# Patient Record
Sex: Female | Born: 1940 | Race: Black or African American | Hispanic: No | Smoking: Former smoker
Health system: Southern US, Community
[De-identification: ages and names within clinical notes are randomized; demographics above are authoritative.]

## PROBLEM LIST (undated history)

## (undated) DIAGNOSIS — D649 Anemia, unspecified: Secondary | ICD-10-CM

## (undated) DIAGNOSIS — I10 Essential (primary) hypertension: Secondary | ICD-10-CM

## (undated) DIAGNOSIS — I739 Peripheral vascular disease, unspecified: Secondary | ICD-10-CM

## (undated) DIAGNOSIS — I509 Heart failure, unspecified: Secondary | ICD-10-CM

## (undated) DIAGNOSIS — Z992 Dependence on renal dialysis: Secondary | ICD-10-CM

## (undated) DIAGNOSIS — I2510 Atherosclerotic heart disease of native coronary artery without angina pectoris: Secondary | ICD-10-CM

## (undated) DIAGNOSIS — L0390 Cellulitis, unspecified: Secondary | ICD-10-CM

## (undated) DIAGNOSIS — I82409 Acute embolism and thrombosis of unspecified deep veins of unspecified lower extremity: Secondary | ICD-10-CM

## (undated) DIAGNOSIS — IMO0002 Reserved for concepts with insufficient information to code with codable children: Secondary | ICD-10-CM

## (undated) DIAGNOSIS — E785 Hyperlipidemia, unspecified: Secondary | ICD-10-CM

## (undated) DIAGNOSIS — G4700 Insomnia, unspecified: Secondary | ICD-10-CM

## (undated) DIAGNOSIS — N186 End stage renal disease: Secondary | ICD-10-CM

## (undated) HISTORY — DX: Anemia, unspecified: D64.9

## (undated) HISTORY — DX: Acute embolism and thrombosis of unspecified deep veins of unspecified lower extremity: I82.409

## (undated) HISTORY — DX: Essential (primary) hypertension: I10

## (undated) HISTORY — DX: Cellulitis, unspecified: L03.90

## (undated) HISTORY — PX: TUBAL LIGATION: SHX77

## (undated) HISTORY — DX: Heart failure, unspecified: I50.9

## (undated) HISTORY — DX: Peripheral vascular disease, unspecified: I73.9

## (undated) HISTORY — DX: Atherosclerotic heart disease of native coronary artery without angina pectoris: I25.10

## (undated) HISTORY — DX: Reserved for concepts with insufficient information to code with codable children: IMO0002

## (undated) HISTORY — DX: Hyperlipidemia, unspecified: E78.5

---

## 1998-01-30 ENCOUNTER — Inpatient Hospital Stay (HOSPITAL_COMMUNITY): Admission: EM | Admit: 1998-01-30 | Discharge: 1998-01-30 | Payer: Self-pay | Admitting: Obstetrics & Gynecology

## 1999-08-15 ENCOUNTER — Inpatient Hospital Stay (HOSPITAL_COMMUNITY): Admission: AD | Admit: 1999-08-15 | Discharge: 1999-08-15 | Payer: Self-pay | Admitting: Obstetrics

## 1999-12-28 ENCOUNTER — Ambulatory Visit (HOSPITAL_COMMUNITY): Admission: RE | Admit: 1999-12-28 | Discharge: 1999-12-28 | Payer: Self-pay | Admitting: Family Medicine

## 1999-12-28 ENCOUNTER — Encounter: Payer: Self-pay | Admitting: Family Medicine

## 2000-03-27 ENCOUNTER — Other Ambulatory Visit: Admission: RE | Admit: 2000-03-27 | Discharge: 2000-03-27 | Payer: Self-pay | Admitting: Family Medicine

## 2001-02-18 ENCOUNTER — Encounter: Payer: Self-pay | Admitting: Family Medicine

## 2001-02-18 ENCOUNTER — Ambulatory Visit (HOSPITAL_COMMUNITY): Admission: RE | Admit: 2001-02-18 | Discharge: 2001-02-18 | Payer: Self-pay | Admitting: Family Medicine

## 2001-03-09 ENCOUNTER — Encounter: Admission: RE | Admit: 2001-03-09 | Discharge: 2001-03-09 | Payer: Self-pay | Admitting: Family Medicine

## 2001-03-09 ENCOUNTER — Encounter: Payer: Self-pay | Admitting: Family Medicine

## 2002-09-10 ENCOUNTER — Encounter: Payer: Self-pay | Admitting: Family Medicine

## 2002-09-10 ENCOUNTER — Encounter: Admission: RE | Admit: 2002-09-10 | Discharge: 2002-09-10 | Payer: Self-pay | Admitting: Family Medicine

## 2004-03-13 ENCOUNTER — Encounter: Admission: RE | Admit: 2004-03-13 | Discharge: 2004-03-13 | Payer: Self-pay | Admitting: Family Medicine

## 2005-03-14 ENCOUNTER — Encounter: Admission: RE | Admit: 2005-03-14 | Discharge: 2005-03-14 | Payer: Self-pay | Admitting: Family Medicine

## 2005-04-01 ENCOUNTER — Inpatient Hospital Stay (HOSPITAL_BASED_OUTPATIENT_CLINIC_OR_DEPARTMENT_OTHER): Admission: RE | Admit: 2005-04-01 | Discharge: 2005-04-01 | Payer: Self-pay | Admitting: Cardiovascular Disease

## 2005-04-01 ENCOUNTER — Ambulatory Visit: Payer: Self-pay | Admitting: Cardiology

## 2005-04-01 ENCOUNTER — Ambulatory Visit: Payer: Self-pay | Admitting: Cardiovascular Disease

## 2006-03-18 ENCOUNTER — Encounter: Admission: RE | Admit: 2006-03-18 | Discharge: 2006-03-18 | Payer: Self-pay | Admitting: Family Medicine

## 2007-01-02 ENCOUNTER — Emergency Department (HOSPITAL_COMMUNITY): Admission: EM | Admit: 2007-01-02 | Discharge: 2007-01-02 | Payer: Self-pay | Admitting: Emergency Medicine

## 2007-06-29 ENCOUNTER — Encounter: Admission: RE | Admit: 2007-06-29 | Discharge: 2007-06-29 | Payer: Self-pay | Admitting: Family Medicine

## 2007-07-10 ENCOUNTER — Encounter: Admission: RE | Admit: 2007-07-10 | Discharge: 2007-07-10 | Payer: Self-pay | Admitting: Family Medicine

## 2008-04-20 ENCOUNTER — Inpatient Hospital Stay (HOSPITAL_COMMUNITY): Admission: EM | Admit: 2008-04-20 | Discharge: 2008-05-02 | Payer: Self-pay | Admitting: Emergency Medicine

## 2008-04-22 HISTORY — PX: CARDIAC CATHETERIZATION: SHX172

## 2008-04-23 ENCOUNTER — Ambulatory Visit: Payer: Self-pay | Admitting: Cardiothoracic Surgery

## 2008-04-23 ENCOUNTER — Encounter: Payer: Self-pay | Admitting: Cardiothoracic Surgery

## 2008-04-23 ENCOUNTER — Encounter (INDEPENDENT_AMBULATORY_CARE_PROVIDER_SITE_OTHER): Payer: Self-pay | Admitting: Cardiology

## 2008-04-24 ENCOUNTER — Encounter: Payer: Self-pay | Admitting: Cardiothoracic Surgery

## 2008-04-25 HISTORY — PX: CORONARY ARTERY BYPASS GRAFT: SHX141

## 2008-05-20 ENCOUNTER — Emergency Department (HOSPITAL_COMMUNITY): Admission: EM | Admit: 2008-05-20 | Discharge: 2008-05-20 | Payer: Self-pay | Admitting: Emergency Medicine

## 2008-05-26 ENCOUNTER — Encounter: Admission: RE | Admit: 2008-05-26 | Discharge: 2008-07-05 | Payer: Self-pay | Admitting: Cardiology

## 2008-05-26 ENCOUNTER — Encounter: Admission: RE | Admit: 2008-05-26 | Discharge: 2008-05-26 | Payer: Self-pay | Admitting: Cardiothoracic Surgery

## 2008-05-26 ENCOUNTER — Ambulatory Visit: Payer: Self-pay | Admitting: Cardiothoracic Surgery

## 2008-05-28 ENCOUNTER — Emergency Department (HOSPITAL_COMMUNITY): Admission: EM | Admit: 2008-05-28 | Discharge: 2008-05-28 | Payer: Self-pay | Admitting: Emergency Medicine

## 2008-05-29 ENCOUNTER — Emergency Department (HOSPITAL_COMMUNITY): Admission: EM | Admit: 2008-05-29 | Discharge: 2008-05-29 | Payer: Self-pay | Admitting: Emergency Medicine

## 2008-06-02 ENCOUNTER — Ambulatory Visit: Payer: Self-pay | Admitting: Cardiothoracic Surgery

## 2008-07-03 ENCOUNTER — Emergency Department (HOSPITAL_COMMUNITY): Admission: EM | Admit: 2008-07-03 | Discharge: 2008-07-03 | Payer: Self-pay | Admitting: Emergency Medicine

## 2008-07-14 ENCOUNTER — Ambulatory Visit: Payer: Self-pay | Admitting: Cardiothoracic Surgery

## 2008-07-21 ENCOUNTER — Encounter (HOSPITAL_COMMUNITY): Admission: RE | Admit: 2008-07-21 | Discharge: 2008-10-10 | Payer: Self-pay | Admitting: Cardiology

## 2009-05-28 IMAGING — CR DG CHEST 2V
1 series · 1 of 1 positions shown · non-contrast
Comparison: 04/28/2008

CLINICAL DATA: Unstable angina.  Chest pain.

CHEST - 2 VIEW

[view not recorded]
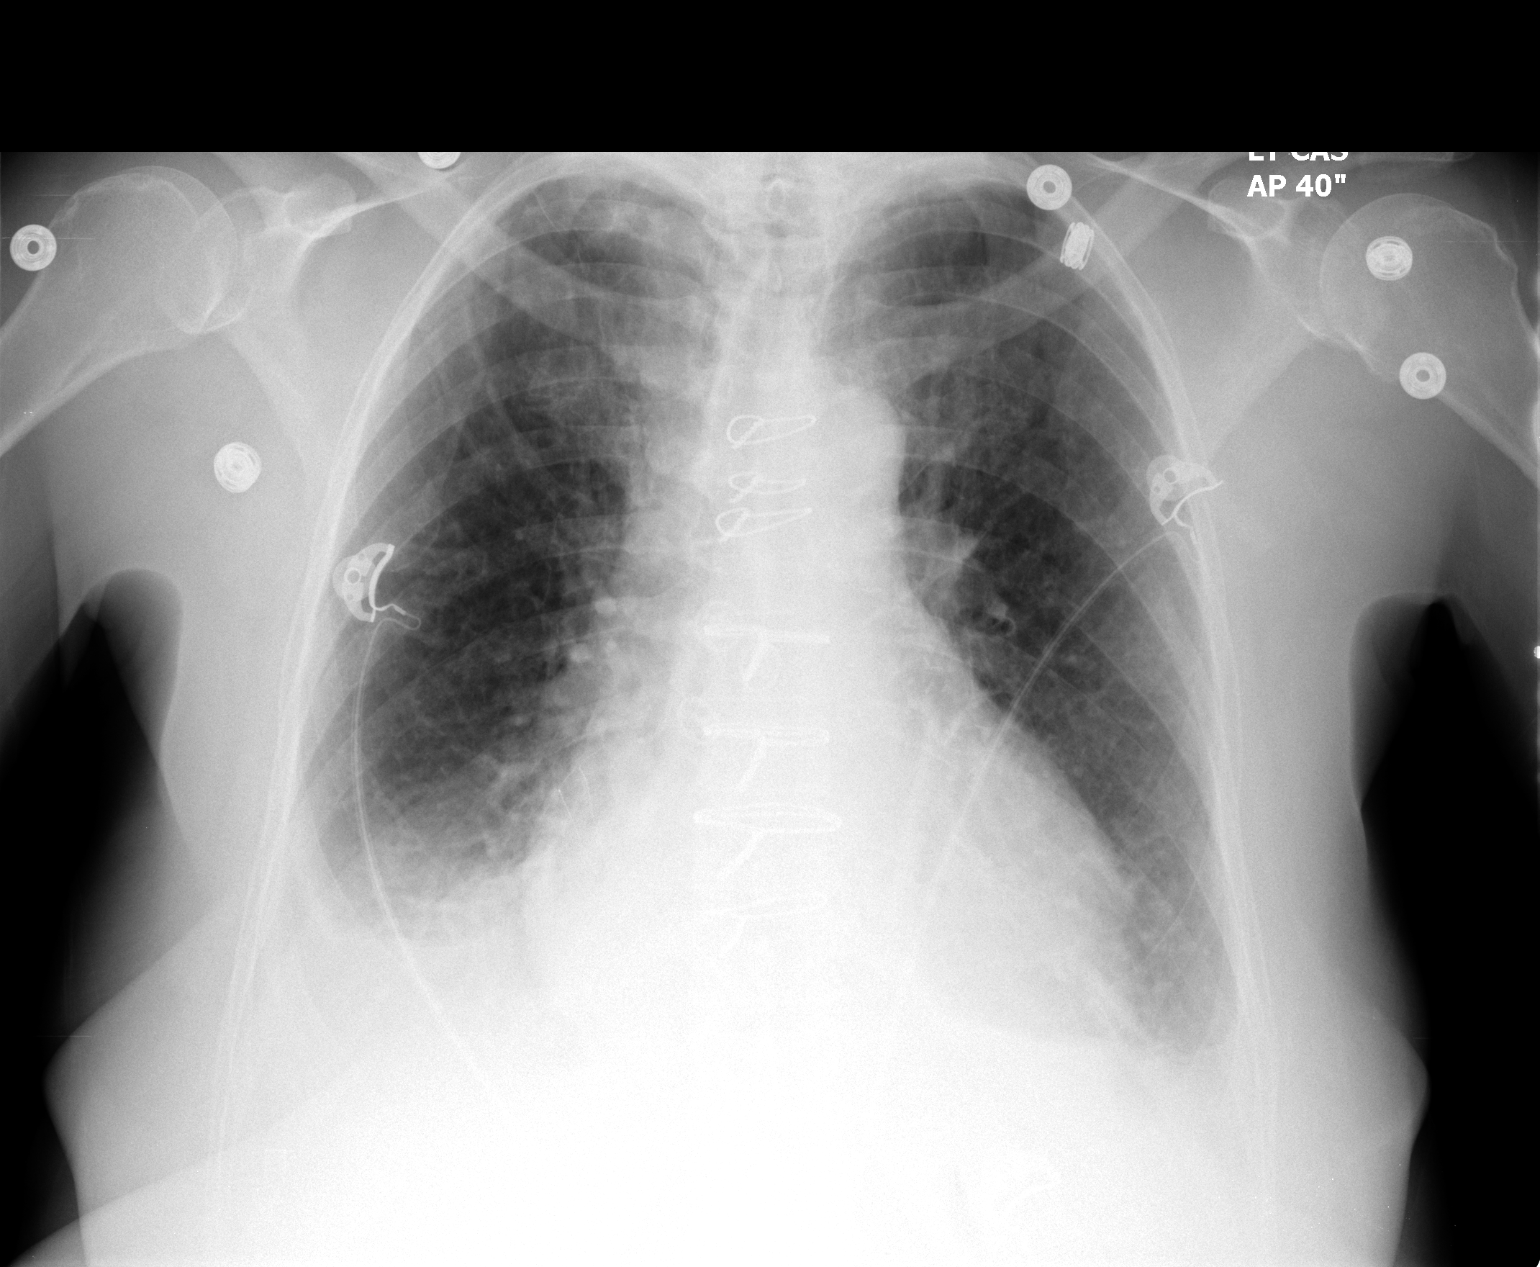

[1 of 1 positions shown; findings below may reference images not displayed]

FINDINGS: The patient is status post median sternotomy and CABG
procedure.

There is moderate cardiac enlargement unchanged.

Pleural effusions and pulmonary edema consistent with congestive
heart failure, stable.

The left apical pneumothorax is not visible on current exam.
IMPRESSION: 1.  No change in congestive heart failure pattern.
2.  No visible pneumothorax identified.

## 2010-07-23 ENCOUNTER — Emergency Department (HOSPITAL_COMMUNITY): Admission: EM | Admit: 2010-07-23 | Discharge: 2010-07-24 | Payer: Self-pay | Admitting: Emergency Medicine

## 2010-11-05 ENCOUNTER — Encounter: Payer: Self-pay | Admitting: Family Medicine

## 2011-02-26 NOTE — Consult Note (Signed)
[name], [name]                  ACCOUNT NO.:  [id number]   MEDICAL RECORD NO.:  [id number]          PATIENT TYPE:  INP   LOCATION:  3703                         FACILITY:  [facility]   PHYSICIAN:  Sheliah Plane, MD    DATE OF BIRTH:  [birth year]   DATE OF CONSULTATION:  04/23/2008  DATE OF DISCHARGE:                                 CONSULTATION   REQUESTING PHYSICIAN:  Ritta Slot, MD   FOLLOWUP CARDIOLOGISTAundra Dubin. Revankar, MD, at [facility].   PRIMARY CARE PHYSICIAN:  Brooke Bonito, MD   REASON FOR CONSULTATION:  Coronary occlusive disease.   HISTORY OF PRESENT ILLNESS:  The patient is a 70 year old female with  known coronary history, who had an inferior myocardial infarction in  2000, treated with cardiac catheterization and then medical therapy.  She had a low-risk stress test done in 2006.  A followup stress test was  done 3 months ago, which was reported as abnormal.  The patient has had  approximately a 2-week episodes of increasing left arm pain and chest  soreness.  A little over a week ago, she awoke during the night with  night sweats and diaphoresis and no fever and short of breath.  She  noted her glucose was over 300 and went to the [facility] Emergency  Room where she was admitted for 2 days, told she did not have a  myocardial infarction, and was discharged home.  She came in on April 20, 2008, with recurrent left arm pain and substernal pain to Dr. Marylen Ponto  office, and was referred to the [facility] Emergency Room.  She was admitted.  Troponin was 0.06.  She has been pain-free since in the hospital.  The  patient has been on Coumadin for unknown reason, which she is unaware  of, and not documented in her chart.  Coumadin was held and ultimately,  she underwent cardiac catheterization yesterday, and Cardiac Surgery is  consulted for consideration of coronary artery bypass grafting.  The  patient's cardiac risk factors include hypertension, hyperlipidemia, a 6-  year history of diabetes, unknown hemoglobin A1c, remote smoking  history, but quit in 1999.  She has had no previous stroke, denies  claudication, and denies renal insufficiency.   PAST MEDICAL HISTORY:  Significant for Coumadin therapy, question of  reason, now on admission was 2.2.  The Coumadin is managed by Dr.  Kem Parkinson office in [place].   PAST SURGICAL HISTORY:  Includes tubal ligation.   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  Denies alcohol use.   FAMILY HISTORY:  Significant for her father died in 67s of skin cancer  and mother died at 63 of kidney cancer.  The patient has 8 siblings, 5  are living, 2 of alcohol-related illnesses, and 1 died as a child.  Other history is unknown.   CURRENT MEDICATIONS:  1. Lisinopril 20 mg a day.  2. Lopressor 50 mg daily.  3. Metformin 500 mg b.i.d.  4.  Lanoxin 0.25 mg a day.  5. Simvastatin 40 mg a day.  6. Lasix 40 mg a day.  7. Levoxyl 4 g a day.  8. Norvasc 5 mg a day.  9. Protonix 40 mg a day.  10.Benicar 20 mg a day.  11.Premarin 0.625 mg a day.  12.Provera 2.5 mg a day.  13.Sliding scale insulin.  14.On admission, the patient had been taking Coumadin.   DRUG ALLERGIES:  ASPIRIN causes rash and hives.   REVIEW OF SYSTEMS:  Cardiac review of systems is positive for chest  pain, exertional shortness of breath, and orthopnea.  Denies lower  extremity edema.  Denies palpitations.  Denies syncope or presyncope.  The patient specifically denies ever having any episodes of rapid heart  rate or being admitted to the hospital for rapid heart rate.  General  review of systems, the patient has had recent night sweats, but no  fevers, no cough, and no hemoptysis.  She does have a history of  wheezing occasionally.  Neurologic, denies amaurosis or TIAs.  She has  arthritis, especially in her left thumb.  The patient denies  any blood  in her stool.  No history of colonoscopy in the past.  She has noted  while at [facility], a very foul-smelling urine and cloudy, it  has since cleared.  Denies psychiatric history.  Other review of systems  are negative.   PHYSICAL EXAMINATION:  VITAL SIGNS:  Blood pressure is 142/56, heart  rate 75 and regular, respiratory rate is 18, O2 sats 95%, and  temperature is 98.6.  The patient is 5 feet 3 inches tall and weighs 173  pounds.  GENERAL:  Awake, alert, neurologically intact, and has poor dentition.  NECK:  No carotid bruits.  LUNGS:  Clear bilaterally.  CARDIAC:  Regular rate and rhythm without murmur or gallop.  ABDOMEN:  Moderately obese abdomen without palpable masses or  tenderness.  The right groin site is without significant hematoma.  EXTREMITIES:  She has no pedal edema, +2 DP and PT pulses bilaterally,  appears to adequate vein for bypass bilaterally.   LABORATORY FINDINGS:  Hematocrit is 35.2, hemoglobin 11.6, white count  10.2, glucose was elevated at 298, and creatinine is 0.01.  Yesterday,  PT was 18.6 and INR was 1.5.   Cardiac catheterization films are reviewed and discussed with Dr.  Mariah Milling.  The patient has 70% proximal right and mid right is totally  occluded.  There is very little at the distal artery.  She has a 90%  ostial circumflex, a small ramus with 90%, several OM branches that  probably looks significant at the bypass.  The distal circ __________  is very small.  LAD has a ostial 60%-70% and mid third 70%-80%.  Ejection fraction is decreased to 40%-45% with inferior wall  hypokinesis.   IMPRESSION:  The patient with accelerating unstable anginal symptoms  with a significant 3-vessel coronary artery disease.  After seeing the  patient and reviewing the films, I discussed the case with Dr. Mariah Milling, I  agreed with his assessment that coronary artery bypass grafting would  offer the best long-term treatment for the patients,  especially with the  complexity of her lesions and being diabetic.  The risks of surgery  including the death, infection, stroke,  myocardial infarction, being blood transfused were all discussed with  the patient in detail.  Prior to surgery, we will obtain carotid Doppler  studies, hemoglobin A1c, and repeat coagulation studies.  Consider her  for surgery on Monday, April 25, 2008.      Sheliah Plane, MD  Electronically Signed     EG/MEDQ  D:  04/23/2008  T:  04/24/2008  Job:  [id number]   cc:   Aundra Dubin. Revankar, M.D.  Antonieta Iba, MD  Brooke Bonito, M.D.

## 2011-02-26 NOTE — Op Note (Signed)
[name], [name]                  ACCOUNT NO.:  [id number]   MEDICAL RECORD NO.:  [id number]          PATIENT TYPE:  INP   LOCATION:  2306                         FACILITY:  [facility]   PHYSICIAN:  Sheliah Plane, MD    DATE OF BIRTH:  [birth year]   DATE OF PROCEDURE:  04/25/2008  DATE OF DISCHARGE:                               OPERATIVE REPORT   PREOPERATIVE DIAGNOSIS:  Coronary occlusive disease with unstable  angina.   POSTOPERATIVE DIAGNOSIS:  Coronary occlusive disease with unstable  angina.   SURGICAL PROCEDURES:  Coronary artery bypass grafting x5 with left  internal mammary to left anterior descending coronary, reverse saphenous  vein graft to the sequentially to the second and first intermediate  coronary artery, reverse saphenous vein graft to the distal circumflex,  reverse saphenous vein graft to the right coronary artery with right  endovein harvesting from the right thigh and calf.   SURGEON:  Sheliah Plane, MD   FIRST ASSISTANT:  Zadie Rhine, PA   BRIEF HISTORY:  The patient is a 70 year old female who presented with  unstable anginal symptoms as she was admitted first to [facility], cardiac catheterization was recommended; however, the patient  declined and was discharged home.  She returns to [facility] Emergency Room  with recurrent unstable angina.  Enzymes were negative.  Cardiac  catheterization was performed on April 22, 2008 by Dr. Thomasena Edis, which  demonstrated significant 3-vessel coronary artery disease with total  occlusion of the right coronary artery greater than 90% stenosis of  proximal circumflex with disease involving to intermediate branches and  the distal circumflex.  The left anterior descending coronary artery  also had a proximal 70-80% lesion.  Ejection fraction was traced  approximately 40% with inferior hypokinesis, coronary artery bypass  grafting was recommended to the patient, who agreed and signed formed  consent.   DESCRIPTION OF PROCEDURE:  With Swan-Ganz arterial line monitors in  place, the patient underwent general endotracheal anesthesia without  incidence.  Given chest and legs prepped with Betadine and draped in the  usual sterile manner.  Using the Guidant endovein harvest system, vein  was harvested from the right thigh and calf.  The vein was of adequate  quality and caliber.  Median sternotomy was performed.  Left internal  mammary artery was dissected down as pedicle graft.  This artery was  divided and had good free flow.  Pericardium was opened.  Overall  ventricular function and anterior appeared preserved.  The patient did  have evidence of inferior scar in the vicinity of  moderate size  posterior lateral branch.  The patient was systemically heparinized,  ascending aorta was cannulated.  The right atrium was cannulated and  aortic root vent cardioplegia needle was introduced into the ascending  aorta.  The patient was placed on cardiopulmonary bypass at 2.4 liters  per minute per meter square.  Sites of anastomosis were selected and  dissected out of the epicardium.  The patient's body temperature was  cooled to 30 degrees.  Aortic crossclamp was applied and 500 mL of cold  blood potassium  cardioplegia was administered with rapid diastolic  arrest of the heart.  Myocardial septal temperatures monitored  throughout the crossclamp.   Attention was turned first to the distal circumflex which time the AV  groove was opened and admitted a 1-mm probe, vessel was  approximately  1.3 mm in size, using a running 7-0 Prolene, distal anastomosis was  performed.  Attention was then turned to 2 intermediate branches, both  small and intramyocardial.  There were identified, the second  intermediate was opened, and using a longitudinal side-to-side  anastomosis was carried out, distal extent of the same vein was then  carried to this first intermediate vessel which was opened more distally  on  the vessel and admitted a 1-mm probe using running 7-0 Prolene,  distal anastomosis was performed.  Attention was then turned to the  distal right coronary artery adjusted to take off of posterior  descending vessel was opened and it was appeared very small and  collateral filling from the catheterization.  However, the 1-1/2-mm  probe passed down the posterior descending, posterior lateral branches  easily using a running 7-0 Prolene, distal anastomosis was performed.  Attention was then turned to left anterior descending coronary artery  which was good quality vessel 1.5-1.6 mm in size, using a running 8-0  Prolene, the left internal mammary artery was anastomosed to left  anterior descending artery with aortic crossclamp still in place, 3  punch aortotomies were performed.  Each of the 3 vein grafts were  anastomosed to the ascending aorta.  Air was evacuated from the grafts,  partial occlusion clamp was removed, aortic crossclamp was removed with  total crossclamp x98 minutes.  Sites of anastomosis were inspected and  free of bleeding.  The patient was then ventilated, and weaned  cardiopulmonary bypass without difficulty.  She remained hemodynamically  stable and was decannulated in the usual fashion.  Protamine sulfate was  administered within operative field.  Hemostatic two atrial, two  ventricular pacing wires were applied.  Graft markers applied.  The left  pleural tube and a Blake mediastinal drain were left in place.  Sternum  closed with #6 stainless steel wire.  Fascia closed with interrupted 0  Vicryl, running 3-0 Vicryl, subcutaneous tissue, and 4-0 subcuticular  stitch in skin edges.  Dry dressings were applied.  Sponge and needle  count was reported as correct at the completion of procedure.  The  patient tolerated the procedure without obvious complication and was  transferred to Surgical Intensive Care Unit for further postoperative  care.      Sheliah Plane,  MD  Electronically Signed     EG/MEDQ  D:  04/25/2008  T:  04/26/2008  Job:  [id number]   cc:   Antionette Char, MD  Antonieta Iba, MD  Aundra Dubin Revankar, M.D.

## 2011-02-26 NOTE — Assessment & Plan Note (Signed)
OFFICE VISIT   [name], [name]  DOB:  06/27/1941                                        June 02, 2008  CHART #:  [id number]   The patient returns to the office today in followup to check on her leg  incision, which had some minor erythema last week and was started on  Keflex.  She notes that after taking 3 Keflex pills, each time she  became nauseated and vomited up.  So she discontinued it.  In spite of  this, her leg has much improved from last week.  She has had no fever or  chills.   On exam, her blood pressure 151/79, pulse 78, respiratory rate is 18,  and O2 sats 98%.  Her sternum is stable and well healed.  Lungs are  clear bilaterally.  She has no pedal edema.  The right endovein harvest  site is healing well.  Distal leg with erythema is markedly improved.   She continues on:  1. Prilosec 20 mg a day.  2. Lasix 40 mg a day.  3. Plavix 75 mg a day.  4. Potassium 20 mEq a day.  5. Lipitor 40 mg a day.  6. Folic acid a mg a day.  7. Lopressor 25 b.i.d.  8. Metformin 1000 mg b.i.d.  9. Lisinopril 10 mg a day.  10.Lanoxin 0.25 mg a day.  11.Prempro 1 per day.   Overall, she is making very good progress.  She has an appointment to  see Dr. Lynnea Ferrier in [place] next week and Dr. Juleen China tomorrow.  Certain  issues remained as whether she should  still be on Prempro.  Preoperatively, she had been maintained on  Coumadin, but it was never clear from our records why.  Currently, she  is on aspirin and Plavix.   Sheliah Plane, MD  Electronically Signed   EG/MEDQ  D:  06/02/2008  T:  06/02/2008  Job:  [id number]   cc:   Brooke Bonito, M.D.  Ritta Slot, MD  Burnell Blanks, MD

## 2011-02-26 NOTE — Assessment & Plan Note (Signed)
OFFICE VISIT   [name], [name]  DOB:  23-Apr-1941                                        July 14, 2008  CHART #:  [id number]   The patient comes in today complaining of pain in her right leg.  She is  status post CABG on 04/25/2008.  She had been most recently seen in our  office in August with a right lower extremity EVH site infection for  which she took a course of Keflex.  On her last visit, the site was  healing well and she had no further problems until approximately 3 days  ago.  At that point, she developed pain at the distal-most portion of  the [facility] tunnel right at the stab incision site.  She states that the  area has been swollen and she is having shooting pains extending from  that point into her anterior foot.  She denies any fevers, chills,  shortness of breath, or chest pain.  She has not seen her family  physician or her cardiologist, but called our office for refill on pain  medication and was asked to present for an evaluation.   PHYSICAL EXAMINATION:  VITAL SIGNS:  Blood pressure 130/69, pulse 76,  respirations 18, and O2 sat 98% on room air.  GENERAL:  Sternal incision has healed well and is her right thigh EVH  site.  HEART:  Regular rate and rhythm.  LUNGS:  Clear.  EXTREMITIES:  Examination of her right calf shows very minimal edema  right at the stab site.  There is no specific generalized edema.  No  erythema, no palpable fluid collection, and no tenderness.  There is no  drainage from the incision itself and it appears to be well  approximated.   ASSESSMENT AND PLAN:  Dr. Tyrone Sage has also seen the patient and we  reviewed her records.  She does have a history of diabetes, which at  times poorly controlled and for she is followed by Dr. Juleen China, M.D.  She  also has a history of peripheral vascular disease with an ankle-brachial  index on the right of 0.4.  There does not appear to be any specific  evidence of infection in the  right lower extremity today.  Dr.  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  We will have her observe this and if there are any  changes including swelling, drainage, fever, or erythema, she is asked  to call our office immediately.  It also may be in the future that she  will need to see a vascular surgeon for further workup of her peripheral  vascular occlusive disease.  She is asked to contact us if she has any  further problems and we will see her back on p.r.n. basis otherwise.   Sheliah Plane, MD  Electronically Signed   GC/MEDQ  D:  07/14/2008  T:  07/15/2008  Job:  [id number]   cc:   Ritta Slot, MD  Brooke Bonito, M.D.

## 2011-02-26 NOTE — Cardiovascular Report (Signed)
[name], RAMNAUTH                  ACCOUNT NO.:  [id number]   MEDICAL RECORD NO.:  [id number]          PATIENT TYPE:  INP   LOCATION:  3703                         FACILITY:  [facility]   PHYSICIAN:  Antonieta Iba, MD   DATE OF BIRTH:  1940/11/16   DATE OF PROCEDURE:  04/22/2008  DATE OF DISCHARGE:                            CARDIAC CATHETERIZATION   PHYSICIAN PERFORMING THE PROCEDURE:  Antonieta Iba, MD   INDICATIONS FOR PROCEDURE:  Ms. Koskela is a very pleasant 70 year old  woman with history of coronary artery disease with worsening chest pain.  She had a positive stress test as an outpatient and presented with chest  pain and further evaluation by cardiac catheterization.   PROCEDURE DETAILS:  The patient was prepped and draped in usual sterile  fashion in the Cath Lab.  Local anesthesia with 1% lidocaine was  performed in the right inguinal region and a 6-French catheter was  advanced through the right femoral artery.  A Judkins left #4 and right  #4 catheter were used to engage the left and right coronary arteries  respectively.  Selective angiography was performed with hand injection  of contrast in various angles to adequately visualize the coronary  arteries.  A pigtail catheter was used to cross the aortic valve and LV  gram was performed.  The sheath was removed at the end of the procedure  and hemostasis was obtained.  No complications were reported.   FINDINGS:  Left main; the left main is a moderate-sized vessel that  bifurcates into left circumflex and the LAD.  There was no significant  disease noted.   LAD; the left anterior descending is a moderate-to-large size vessel  with one diagonal vessel.  There is 70% ostial disease and 70%-80%  proximal disease.  Otherwise, there is no significant disease of the mid-  to-distal LAD and no significant diagonal disease.   Left circumflex; the left circumflex is a large network of branches.  There was a high OM/ramus, as  well as a second high OM #2 and distal OM  branches 3 and 4.  There is 90% ostial left circumflex disease, 70%  ostial ramus/OM1 disease.  The ramus branch also has a 90% proximal  disease.  There is some mild/30% mid-to-distal left circumflex disease;  otherwise, the obtuse marginal branches had no significant disease.   Right coronary artery; the RCA is occluded in the mid region.  It also  has 70% proximal disease and a small-to-moderate size marginal branch.  There are collaterals running from left to right.   LV gram; ejection fraction shows a mildly reduced systolic function with  mild global hypokinesis, moderate inferior wall hypokinesis.  Estimated  ejection fraction of 40-45%.   FINAL IMPRESSION:  Severe three-vessel coronary artery disease.  Of  note, there is severe ostial left circumflex disease extending into the  OM1.  There is also ostial left anterior descending disease, as well as  severe mid left anterior descending disease.  There is severe right  coronary artery disease with collaterals from left-to-right.   Given the numerous lesions,  the images were discussed with Ms. Mastropietro and  her family.  A consultation will be made to the CT Surgery Service for  possible bypass surgery.  The case was discussed with Dr. Yates Decamp and  he is in agreement.  Of note, angiography review frame #10 shows the  ostial lesion of the circumflex well.  She will be started on heparin  for protection through the weekend.  All aspirin and Plavix has been  held for more than 5 days.      Antonieta Iba, MD  Electronically Signed     TJG/MEDQ  D:  04/22/2008  T:  04/23/2008  Job:  [id number]

## 2011-02-26 NOTE — Consult Note (Signed)
[name], [name]                  ACCOUNT NO.:  [id number]   MEDICAL RECORD NO.:  [id number]          PATIENT TYPE:  INP   LOCATION:  2019                         FACILITY:  [facility]   PHYSICIAN:  Michelene Gardener, MD    DATE OF BIRTH:  1940/10/19   DATE OF CONSULTATION:  04/28/2008  DATE OF DISCHARGE:                                 CONSULTATION   PRIORITY CONSULTATION   REASON FOR CONSULTATION:  Uncontrolled diabetes.   HISTORY OF PRESENT ILLNESS:  This is a 70 year old African American  female, now a past medical history known for coronary artery disease,  presented to the hospital for evaluation of worsening chest pain.  This  patient was admitted under the Cardiology service by Dr. Mariah Milling, and she  underwent a cardiac catheterization on July 10 that showed severe 3-  vessel coronary artery disease.  The patient was taken for CABG on April 25, 2008, and that went without complications.  A consult was called  today for uncontrolled diabetes.  This patient had hemoglobin A1c done  on July 12 and it was 10.6.  Patient takes Humulin 50/50, 65 units twice  daily at home in addition to Humulin-L 35 units twice daily.  She stated  that her sugar normally is not very well controlled and it runs in the  200 to 300, and she has attributed that to her diet since she has been  very noncompliant and she can take all sugars including ice cream and  all other sugar.  She was advised many times by her primary doctor to be  more compliant with her diet, but she continued to do the same.  Here in  the hospital, she has been on Lantus, and her Lantus was at 20 units  twice a day, and her sugar has been running in the range of 200 to 300.  Currently, the patient is asymptomatic, she is chest pain free, and she  does not have shortness of breath.   PAST MEDICAL HISTORY:  Significant for:  1. Coronary artery disease.  2. Hypertension.  3. Hyperlipidemia.  4. Hypothyroidism.  5. GERD.  6.  Obesity.   MEDICATIONS ON ADMISSION:  Includes:  1. Lisinopril 20 mg once a day.  2. Lopressor 50 mg once a day.  3. Metformin 500 mg twice daily, and patient has stated that she      stopped the metformin because it caused her a lot of abdominal      discomfort and diarrhea so she was not taking this for some time.  4. Lanoxin 0.25 mg once a day.  5. Simvastatin 40 mg once a day.  6. Lasix 40 mg once a day.  7. Synthroid unknown dose.  8. Norvasc 30 mg once a day.  9. Protonix 40 mg once a day.  10.Benicar 20 mg once a day.  11.Premarin 0.625 mg once a day.  12.Provera 2.5 mg once a day.  13.Humulin 50/50, 65 units twice daily.  14.Humulin-L 35 units twice daily.   PAST SURGICAL HISTORY:  1. Status post CABG on April 25, 2008, at [facility].  2. History of tubal ligation.   ALLERGIES:  ASPIRIN.   FAMILY HISTORY:  Father died at 29 of a skin cancer.  Mother died at age  60 of kidney cancer.  Patient has 8 siblings, 5 are living, 2 have  alcohol-related illness and 1 died as a child.   SOCIAL HISTORY:  The patient lives alone.  She has children.  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  She denied smoking and she denies alcohol drinking.   REVIEW OF SYSTEMS:  CONSTITUTIONAL:  Currently positive for  fatigability.  There is no fever.  EYES:  No blurred vision.  ENT:  No  tinnitus.  RESPIRATORY:  No cough, no wheezes.  CARDIOVASCULAR:  Positive for chest pain and shortness of breath.  There is no syncope  and there are no palpitations.  GU:  No dysuria, no hematuria.  GI:  No  nausea, no vomiting, no diarrhea.  ID:  No rash, no lesions.  NEURO:  No  numbness, no tingling.  The rest of the systems were reviewed and they  were negative.   PHYSICAL EXAMINATION:  VITAL SIGNS:  Temperature is 97.0, heart rate 80,  respiratory rate 18, blood pressure 135/63.  GENERAL APPEARANCE:  This is a middle-aged, African  American female not  in acute distress.  HEENT:  Her conjunctivae are pink.  Her pupils are equal and reactive to  light.  There is no ptosis.  Hearing is intact.  There is no ear  discharge or infection.  There is no nasopharyngeal bleeding.  Oral  mucosa is dry.  No pharyngeal erythema.  NECK:  Supple, no JVD, no carotid bruit, no lymphadenopathy, no thyroid  enlargement or thyroid tenderness.  CARDIOVASCULAR:  S1 and S2 are regular.  There is a big scar secondary  to CABG, which seems to be healing.  ABDOMEN:  The abdomen is soft, nondistended, no hernias, no  hepatosplenomegaly, bowel sounds are present.  LOWER EXTREMITIES:  No edema, no cyanosis, no varicosis.  SKIN:  No rash and no erythema.  NEURO:  Cranial nerves are intact II-XII.  There are no motor or sensory  deficits.   LABORATORY RESULTS:  Sodium 136, potassium 4.6, chloride 105, bicarb 24,  glucose 251, BUN 22, creatinine 1.16, calcium 8.0.  WBC 15.6, hemoglobin  8.4, hematocrit 25.1, platelet count is 216.   IMPRESSION:  1. Uncontrolled diabetes.  2. Coronary artery disease status post coronary artery bypass      grafting.  3. Postoperative anemia.  4. Hypertension.  5. Hypothyroidism.  6. Gastroesophageal reflux disease.   PLAN:  This patient seems to be very noncompliant to her diet as an  outpatient.  She was taking a very high dose of insulin as an outpatient  where she takes Humulin 50/50 and she takes 65 units twice a day.  She  also takes Humulin-L 35 units twice a day.  But as per her, she has been  taking everything possible including ice cream and all other food; that  is what raised her sugar.  Her hemoglobin A1c prior to her admission is  10.6, which will indicate very bad control of her sugar in the last 3  months.  A key issue in her treatment will be compliance with her diet.  Here in the hospital, she was started on Lantus.  She was taking 20  units twice a day,  which will be a very small amount  compared to her  large requirement of insulin.  I had a long discussion with her  regarding compliance with her medications and she seems to be  understanding, but she is not promising that she will be compliant.  That has to be reinforced and discussed with her again to maintain a  better control of her sugar.  While here in the hospital, will try to  get her sugar at least below 150 especially with her cardiac problems.  I will increase her Lantus to 35 units twice daily.  I will add NovoLog  4 units to be given with meals.  I will put her in a resistant sliding  scale.  I will monitor her sugar very frequently and I will make  adjustments according to her CBG readings.   Otherwise, we will continue the management that has been already started  by Cardiology and Cardiothoracic Surgery.  This patient was on Coumadin  prior to her admission for an unknown reason.  I will defer the  assessment of restarting Coumadin for her cardiologist and her primary  doctor.  Thank you so much.   TOTAL ASSESSMENT TIME:  One hour.      Michelene Gardener, MD  Electronically Signed     NAE/MEDQ  D:  04/28/2008  T:  04/28/2008  Job:  [id number]

## 2011-02-26 NOTE — Discharge Summary (Signed)
[name], [name]                  ACCOUNT NO.:  [id number]   MEDICAL RECORD NO.:  [id number]          PATIENT TYPE:  INP   LOCATION:  2019                         FACILITY:  [facility]   PHYSICIAN:  Sheliah Plane, MD    DATE OF BIRTH:  [birth year]   DATE OF ADMISSION:  04/20/2008  DATE OF DISCHARGE:  05/02/2008                               DISCHARGE SUMMARY   FINAL DIAGNOSIS:  Coronary occlusive disease with unstable angina.   IN-HOSPITAL DIAGNOSES:  1. Acute blood loss anemia postoperatively.  2. Volume overload postoperatively.  3. Poorly-controlled diabetes mellitus.   SECONDARY DIAGNOSES:  1. Hypertension.  2. Diabetes mellitus.  3. Hyperlipidemia.  4. Gastroesophageal reflux disease.  5. Significant for Coumadin therapy question of reason.  Coumadin is      managed by Dr. Kem Parkinson office in [place].  6. Status post tubal ligation.   IN-HOSPITAL OPERATIONS AND PROCEDURES:  1. Cardiac catheterization.  2. Coronary artery bypass grafting x5 using a left internal mammary      artery to left anterior descending coronary artery, reverse      saphenous vein graft sequentially to the second and first      intermediate coronary artery, reverse saphenous vein graft to      distal circumflex, reverse saphenous vein graft to right coronary      artery with right endovein harvesting from right thigh and calf.   HISTORY AND PHYSICAL AND HOSPITAL COURSE:  The patient is a 67-year  female who presented with unstable anginal symptoms as she was admitted  first to [facility] for which cardiac catheterization was  recommended.  However, the patient declined catheterization and was  discharged home.  She returned to [facility] with recurrent  unstable angina.  Enzymes were negative.  Cardiac catheterization was  done on April 22, 2008 by Dr. Thomasena Edis, which demonstrated significant  three-vessel coronary artery disease with total occlusion of right  coronary artery and  greater than 90% stenosis of the proximal circumflex  with disease involving the intermediate branches into the distal  circumflex.  The left anterior descending coronary artery also had a  proximal 70%-80% lesion.  Ejection fraction was trace approximately 40%  with inferior hypokinesis.  Coronary artery bypass grafting was  recommended.  Dr. Tyrone Sage was consulted.  Dr. Tyrone Sage discussed the  patient undergoing coronary artery bypass grafting.  He discussed risks  and benefits.  The patient also understanding agreed to proceed.  Surgery was scheduled for April 25, 2008.  Preoperatively, the patient  underwent bilateral carotid duplex ultrasound showing on the right to  have 40%-60% ICA stenosis.  On the left, she had no significant ICA  disease.  The patient also had preoperative ABIs done showing on the  right to be 0.41, on the left to be 0.62.  The patient remained stable  preoperatively.  Her hemoglobin A1c although was noted to be 10.6  preoperatively.  For details of the patient's past medical history and  physical exam, please see dictated H&P.   The patient was taken to the operating room on April 25, 2008 where she  underwent coronary bypass grafting x5 using a left internal mammary  artery to left anterior descending coronary artery, reverse saphenous  vein graft sequentially to the second and first intermediate coronary  artery, reverse saphenous vein graft to distal circumflex, reverse  saphenous vein graft to right coronary artery.  Right endovein  harvesting was done from the right thigh and calf.  The patient  tolerated this procedure well and was transferred to the Intensive Care  Unit in stable condition.  The patient was noted to be hemodynamically  stable.  She did require transfusion on the evening of surgery due to  hemoglobin/hematocrit dropping to 7.7 and 23%.  The patient was able to  be extubated on the evening of surgery.  Post extubation, the patient  was  noted to be alert and oriented x4 and neuro intact.  Postop day #1,  chest x-ray was obtained which was stable.  She had minimal drainage  from chest tubes.  No air leak noted.  Chest tubes were discontinued in  normal fashion.  Repeat chest x-ray remained stable.  The patient was  encouraged to use her incentive spirometer.  She was eventually able to  be weaned off oxygen with sats greater than 90% on room air.  Postoperatively, the patient was noted to be in normal sinus rhythm.  All drips were weaned and discontinued.  Heart rate and blood pressure  were stable.  She was able to be restarted on low-dose beta blocker.  The patient remained in normal sinus rhythm postoperatively.  Blood  pressures continued to be monitored and slightly elevated.  She was  restarted on low-dose lisinopril.  The patient's blood pressure  stabilized prior to discharge.  Postoperatively, the patient's  hemoglobin/hematocrit continued to be followed.  As stated above, she  received transfusion on the evening of surgery.  Hemoglobin/hematocrit  increased appropriately.  She continued to have slight anemia.  She was  started on folic acid.  The patient was asymptomatic from her anemia.  Further transfusions were held.  Hemoglobin/hematocrit improved.  Prior  to discharge, the patient's H&H was 8.6 and 26.3%.  As stated above, the  patient was on Coumadin for unknown reason.  It is felt that  postoperatively, the patient is not needed to be restarted on Coumadin.  Due to aspirin allergy, she was restarted on Plavix.  This was agreed  upon by cardiology.  The patient did have mild volume overload  postoperatively.  She was started on diuretics.  Daily weights were  obtained and the patient was 5.7 kg below her preoperative weight.  The  patient's incisions were followed.  They were noted to be clean, dry,  and intact and healing well.  The patient was noted to be diabetic.  At  stated above, she had preoperative  hemoglobin A1c of 10.6.  Blood sugars  postoperatively were elevated.  Internal Medicine was consulted for  diabetic control.  They started the patient on Lantus insulin and  NovoLog.  Her metformin was also able to be restarted.  Blood sugars  improved prior to discharge home.  The patient was out of bed,  ambulating well with cardiac rehab.  She was tolerating diet well.  No  nausea or vomiting noted.   On postop day #7, May 10, 2008, the patient was noted to be afebrile.  She was in normal sinus rhythm with heart rate in the 70s.  Blood  pressure stable at 128/60 with O2 sats 95% on  room air.  Last labs done  on postop day #7 showed a white count 12.7, hemoglobin of 8.6,  hematocrit 26.3.  Sodium of 136, potassium 4.1, chloride of 160, bicarb  of 26, BUN of 13, creatinine 1.02, glucose of 69.  Platelet count noted  to be stable day prior at 364.  The patient was felt to be stable and  ready for discharge home today, May 10, 2008, postop day #7.   FOLLOWUP APPOINTMENTS:  A followup appointment was arranged with Dr.  Tyrone Sage for May 26, 2008 at 2 p.m.  The patient will need to obtain  PMI chest x-ray 30 minutes prior to this appointment.  The patient will  need to follow up with Dr. Tomie China in [place] in 2 weeks for  cardiology.  She will need to contact his office to make these  arrangements.  She also need to follow up with Dr. Juleen China in the next 2  weeks.  If she did not wish to follow up with Dr. Juleen China, she will need  to obtain a new primary care physician to see in the next 2 weeks for  diabetes control.   ACTIVITY:  The patient instructed no driving until released to do so, no  lifting over 10 pounds.  She is told to ambulate 3-4 times per day,  progress as tolerated, and continue her breathing exercises.   INCISIONAL CARE:  The patient is told to shower washing her incisions  using soap and water.  She is to contact the office if she develops any  drainage or opening  from any of her incision sites.   DIET:  The patient educated on diet to be low-fat, low-salt as well as  diabetic diet.   DISCHARGE MEDICATIONS:  1. Provera 2.5 mg daily.  2. Lipitor 40 mg at night.  3. Nitroglycerin sublingual 0.4 mg p.r.n.  4. Lasix 40 mg daily.  5. Potassium 20 mEq daily.  6. Prilosec daily.  7. Lisinopril 10 mg daily.  8. Metformin 500 mg b.i.d.  9. Folic acid 1 mg daily.  10.Ultram 50 mg 1-2 tablets q.4-6 h. p.r.n.  11.Plavix 75 mg daily.  12.Lantus insulin 40 units subcu every 12 hours.  13.Lopressor 25 mg b.i.d.      Stephanie Acre [place], [place]      Sheliah Plane, MD  Electronically Signed    KMD/MEDQ  D:  05/02/2008  T:  05/03/2008  Job:  [id number]   cc:   Brooke Bonito, M.D.  Aundra Dubin. Revankar, M.D.  Ritta Slot, MD

## 2011-02-26 NOTE — Assessment & Plan Note (Signed)
OFFICE VISIT   Rhonda, [name]  DOB:  02-20-1941                                        May 26, 2008  CHART #:  [id number]   The patient is status post coronary artery bypass grafting x5 done by  Dr. Tyrone Sage on April 25, 2008.  Her postoperative course was pretty much  unremarkable.  She was discharged to home in stable condition.  She  presents today for a 3-week followup visit.  On discussion today, the  patient feels that she is progressing well.  She is ambulating several  times a day.  She does complain of a site on her right lower extremity  that is slightly tender to touch and little redness noted.  Denies any  opening or drainage from any of her incision sites.  Denies any nausea  or vomiting.  We would like to talk about obtaining a new cardiologist.  They do not want to see Dr. Aleen Campi again.  The patient's daughter is  in the room with her and states that they tried to contact cardiac  rehab, but cardiac rehab does not have paperwork on her mother.  The  patient is tolerating diet well.  Daughter states that they have been  monitoring her mother's blood sugars, but have been bottoming out at  night.  Dr. Eula Flax has been contacted.   PHYSICAL EXAMINATION:  VITAL SIGNS:  Blood pressure 178/78, pulse of 82,  respirations of 18, and O2 sat 90% on room air.  RESPIRATORY:  Clear to auscultation bilaterally.  CARDIAC:  Regular rate and rhythm.  No murmurs, gallops, or rubs noted.  ABDOMEN:  Benign.  EXTREMITIES:  The patient's right lower extremity EVH site is with some  mild erythema and tender to touch.  No purulent drainage noted.   INCISIONS:  General incision, 2 Vicryl stitches have been removed.  There is no purulent drainage or cellulitis noted.   LABORATORY DATA:  PA and lateral chest x-ray done today, August 13, is  stable.  There is a nodular density in the right apex noted at this  time.  This was noted in her preoperative chest  x-ray.   IMPRESSION AND PLAN:  The patient is status post coronary artery bypass  grafting.  She was evaluated by Dr. Tyrone Sage.  Dr. Tyrone Sage also  evaluated the patient's chest x-ray.  The patient is progressing well.  She was given a prescription for Keflex 500 mg t.i.d. for 7 days for a  right lower extremity wound infection.  We will contact Dr. Windell Hummingbird  office to schedule followup appointment with her next week.  She is told  to contact Dr. Eula Flax' office to follow up with him in the next 1-2 weeks  for blood sugar management.  We will also contact cardiac rehab to  arrange for the patient to start.  The patient is told to continue  ambulating 3-4 times per day.  She is told to do no heavy lifting over  10 pounds.  We will plan to see the patient back in 1 week to recheck  her right lower extremity wound infection.  At this time, we will need  to schedule a followup appointment with the patient, probably in the  next 3-6 months with a repeat chest x-ray to follow up this nodular  density in the right apex.  The patient is told in the interim, if she  has any surgical issues she is to contact us.   Sheliah Plane, MD  Electronically Signed   KMD/MEDQ  D:  05/26/2008  T:  06/08/2008  Job:  [id number]   cc:   Brooke Bonito, M.D.  Ritta Slot, MD

## 2011-02-26 NOTE — Assessment & Plan Note (Signed)
OFFICE VISIT   [name], [name]  DOB:  [birth year]                                        May 26, 2008  CHART #:  [id number]   The patient is status post coronary artery bypass grafting x5 done by  Dr. Tyrone Sage on April 25, 2008.  Her postoperative course was pretty much  unremarkable.  She was discharged to home in stable condition.  She  presents today for a 3-week followup visit.  On discussion today, the  patient feels that she is progressing well.  She is ambulating several  times a day.  She does complain of a site on her right lower extremity  that is slightly tender to touch and little redness noted.  Denies any  opening or drainage from any of her incision sites.  Denies any nausea  or vomiting.  We would like to talk about obtaining a new cardiologist.  They do not want to see Dr. Aleen Campi again.  The patient's daughter is  in the room with her and states that they tried to contact cardiac  rehab, but cardiac rehab does not have paperwork on her mother.  The  patient is tolerating diet well.  Daughter states that they have been  monitoring her mother's blood sugars, but have been bottoming out at  night.  Dr. Eula Flax has been contacted.   PHYSICAL EXAMINATION:  VITAL SIGNS:  Blood pressure 178/78, pulse of 82,  respirations of 18, and O2 sat 90% on room air.  RESPIRATORY:  Clear to auscultation bilaterally.  CARDIAC:  Regular rate and rhythm.  No murmurs, gallops, or rubs noted.  ABDOMEN:  Benign.  EXTREMITIES:  The patient's right lower extremity EVH site is with some  mild erythema and tender to touch.  No purulent drainage noted.   INCISIONS:  General incision, 2 Vicryl stitches have been removed.  There is no purulent drainage or cellulitis noted.   LABORATORY DATA:  PA and lateral chest x-ray done today, August 13, is  stable.  There is a nodular density in the right apex noted at this  time.  This was noted in her preoperative chest  x-ray.   IMPRESSION AND PLAN:  The patient is status post coronary artery bypass  grafting.  She was evaluated by Dr. Tyrone Sage.  Dr. Tyrone Sage also  evaluated the patient's chest x-ray.  The patient is progressing well.  She was given a prescription for Keflex 500 mg t.i.d. for 7 days for a  right lower extremity wound infection.  We will contact Dr. Windell Hummingbird  office to schedule followup appointment with her next week.  She is told  to contact Dr. Eula Flax' office to follow up with him in the next 1-2 weeks  for blood sugar management.  We will also contact cardiac rehab to  arrange for the patient to start.  The patient is told to continue  ambulating 3-4 times per day.  She is told to do no heavy lifting over  10 pounds.  We will plan to see the patient back in 1 week to recheck  her right lower extremity wound infection.  At this time, we will need  to schedule a followup appointment with the patient, probably in the  next 3-6 months with a repeat chest x-ray to follow up this nodular  density in the right apex.  The patient is told in the interim, if she  has any surgical issues she is to contact us.   Sheliah Plane, MD  Electronically Signed   KMD/MEDQ  D:  05/26/2008  T:  05/27/2008  Job:  [id number]   cc:   Meredith Staggers, M.D.  Ritta Slot, MD

## 2011-03-01 NOTE — Cardiovascular Report (Signed)
[name], [name]                  ACCOUNT NO.:  [id number]   MEDICAL RECORD NO.:  [id number]          PATIENT TYPE:  OIB   LOCATION:  6501                         FACILITY:  [facility]   PHYSICIAN:  Charlton Haws, M.D.     DATE OF BIRTH:  May 05, 1941   DATE OF PROCEDURE:  04/01/2005  DATE OF DISCHARGE:                              CARDIAC CATHETERIZATION   PROCEDURE:  Coronary arteriography.   INDICATIONS:  Abnormal Cardiolite study.  History of cardiomyopathy.  Cardiolite study suggesting EF of 37%, with inferior wall defect.   Cine catheterization was done with 4 French catheters from right femoral  artery.   Left main coronary artery had a 20% discrete stenosis.   Left anterior descending artery had 40% tubular disease in the proximal  vessel.  The midvessel had a 50-60% discrete lesion after the takeoff of the  first diagonal branch.  The first diagonal branch had 30-40% multiple  discrete lesions.   The second diagonal branch had 30-40% multiple discrete lesions.   The circumflex coronary artery was tortuous.  There were 30-40% multiple  discrete lesions proximally.  Distal vessel was normal.  There distal  circumflex and septal collaterals to the distal right coronary artery.   The right coronary artery had 80% multiple discrete lesions proximally.  There was 100% occlusion just after the takeoff of the first RV branch.   The distal RCA was visualized from collaterals.   RAO VENTRICULOGRAPHY:  RAO ventriculography showed inferior wall  hypokinesis.  The ejection fraction was in the 40% range.  There was +1  angiographic mitral insufficiency.   IMPRESSION:  The patient essentially has single-vessel right coronary artery  disease which is collateralized.  She has moderate disease in the LAD.  Initial medical therapy is warranted.   The patient will have her captopril increased to t.i.d., as her blood  pressure was somewhat high during the case at 177/65.   She will follow  up with Dr. Tomie China in regard to titration of her  medications.   The patient is not having any significant angina, and her LV function  actually appeared somewhat better than estimated by Cardiolite.       PN/MEDQ  D:  04/01/2005  T:  04/01/2005  Job:  [id number]   cc:   Aundra Dubin. Revankar, M.D.  [address]  [place]  [place] [address]  Fax: [phone]

## 2011-06-16 ENCOUNTER — Inpatient Hospital Stay (HOSPITAL_COMMUNITY)
Admission: EM | Admit: 2011-06-16 | Discharge: 2011-07-03 | DRG: 253 | Disposition: A | Discharge status: Skilled Nursing Facility | Payer: Medicare Other | Attending: Internal Medicine | Admitting: Internal Medicine

## 2011-06-16 DIAGNOSIS — N183 Chronic kidney disease, stage 3 unspecified: Secondary | ICD-10-CM | POA: Diagnosis present

## 2011-06-16 DIAGNOSIS — I5032 Chronic diastolic (congestive) heart failure: Secondary | ICD-10-CM | POA: Diagnosis present

## 2011-06-16 DIAGNOSIS — D638 Anemia in other chronic diseases classified elsewhere: Secondary | ICD-10-CM | POA: Diagnosis present

## 2011-06-16 DIAGNOSIS — E119 Type 2 diabetes mellitus without complications: Secondary | ICD-10-CM | POA: Diagnosis present

## 2011-06-16 DIAGNOSIS — L97509 Non-pressure chronic ulcer of other part of unspecified foot with unspecified severity: Secondary | ICD-10-CM | POA: Diagnosis present

## 2011-06-16 DIAGNOSIS — I8000 Phlebitis and thrombophlebitis of superficial vessels of unspecified lower extremity: Secondary | ICD-10-CM | POA: Diagnosis not present

## 2011-06-16 DIAGNOSIS — K219 Gastro-esophageal reflux disease without esophagitis: Secondary | ICD-10-CM | POA: Diagnosis present

## 2011-06-16 DIAGNOSIS — R5381 Other malaise: Secondary | ICD-10-CM | POA: Diagnosis present

## 2011-06-16 DIAGNOSIS — I2510 Atherosclerotic heart disease of native coronary artery without angina pectoris: Secondary | ICD-10-CM | POA: Diagnosis present

## 2011-06-16 DIAGNOSIS — I824Z9 Acute embolism and thrombosis of unspecified deep veins of unspecified distal lower extremity: Secondary | ICD-10-CM | POA: Diagnosis not present

## 2011-06-16 DIAGNOSIS — E785 Hyperlipidemia, unspecified: Secondary | ICD-10-CM | POA: Diagnosis present

## 2011-06-16 DIAGNOSIS — Z79899 Other long term (current) drug therapy: Secondary | ICD-10-CM

## 2011-06-16 DIAGNOSIS — Z794 Long term (current) use of insulin: Secondary | ICD-10-CM

## 2011-06-16 DIAGNOSIS — L98499 Non-pressure chronic ulcer of skin of other sites with unspecified severity: Principal | ICD-10-CM | POA: Diagnosis present

## 2011-06-16 DIAGNOSIS — Z7901 Long term (current) use of anticoagulants: Secondary | ICD-10-CM

## 2011-06-16 DIAGNOSIS — I129 Hypertensive chronic kidney disease with stage 1 through stage 4 chronic kidney disease, or unspecified chronic kidney disease: Secondary | ICD-10-CM | POA: Diagnosis present

## 2011-06-16 DIAGNOSIS — L02619 Cutaneous abscess of unspecified foot: Secondary | ICD-10-CM | POA: Diagnosis present

## 2011-06-16 DIAGNOSIS — I739 Peripheral vascular disease, unspecified: Principal | ICD-10-CM | POA: Diagnosis present

## 2011-06-16 DIAGNOSIS — K5900 Constipation, unspecified: Secondary | ICD-10-CM | POA: Diagnosis not present

## 2011-06-16 DIAGNOSIS — Z951 Presence of aortocoronary bypass graft: Secondary | ICD-10-CM

## 2011-06-16 DIAGNOSIS — E7800 Pure hypercholesterolemia, unspecified: Secondary | ICD-10-CM | POA: Diagnosis present

## 2011-06-16 DIAGNOSIS — N179 Acute kidney failure, unspecified: Secondary | ICD-10-CM | POA: Diagnosis present

## 2011-06-16 DIAGNOSIS — I509 Heart failure, unspecified: Secondary | ICD-10-CM | POA: Diagnosis present

## 2011-06-16 DIAGNOSIS — D62 Acute posthemorrhagic anemia: Secondary | ICD-10-CM | POA: Diagnosis not present

## 2011-06-16 DIAGNOSIS — E781 Pure hyperglyceridemia: Secondary | ICD-10-CM | POA: Diagnosis present

## 2011-06-16 DIAGNOSIS — I252 Old myocardial infarction: Secondary | ICD-10-CM

## 2011-06-16 DIAGNOSIS — Z87891 Personal history of nicotine dependence: Secondary | ICD-10-CM

## 2011-06-17 ENCOUNTER — Emergency Department (HOSPITAL_COMMUNITY): Payer: Medicare Other

## 2011-06-17 ENCOUNTER — Inpatient Hospital Stay (HOSPITAL_COMMUNITY): Payer: Medicare Other

## 2011-06-17 ENCOUNTER — Other Ambulatory Visit: Payer: Self-pay | Admitting: Internal Medicine

## 2011-06-17 LAB — CBC
HCT: 37.3 % (ref 36.0–46.0)
HCT: 34.8 % — ABNORMAL LOW (ref 36.0–46.0)
Hemoglobin: 11.6 g/dL — ABNORMAL LOW (ref 12.0–15.0)
MCH: 27.9 pg (ref 26.0–34.0)
MCHC: 33 g/dL (ref 30.0–36.0)
MCHC: 33.3 g/dL (ref 30.0–36.0)
RBC: 4.45 MIL/uL (ref 3.87–5.11)
RBC: 4.16 MIL/uL (ref 3.87–5.11)

## 2011-06-17 LAB — COMPREHENSIVE METABOLIC PANEL
ALT: 19 U/L (ref 0–35)
AST: 27 U/L (ref 0–37)
CO2: 27 mEq/L (ref 19–32)
Calcium: 8.9 mg/dL (ref 8.4–10.5)
Chloride: 100 mEq/L (ref 96–112)
GFR calc Af Amer: 50 mL/min — ABNORMAL LOW (ref >60)
GFR calc non Af Amer: 41 mL/min — ABNORMAL LOW (ref >60)
Glucose, Bld: 361 mg/dL — ABNORMAL HIGH (ref 70–99)
Sodium: 136 mEq/L (ref 135–145)
Total Bilirubin: 0.1 mg/dL — ABNORMAL LOW (ref 0.3–1.2)

## 2011-06-17 LAB — CARDIAC PANEL(CRET KIN+CKTOT+MB+TROPI)
CK, MB: 11.9 ng/mL (ref 0.3–4.0)
CK, MB: 11.5 ng/mL (ref 0.3–4.0)
Relative Index: 2.3 (ref 0.0–2.5)
Relative Index: 2.3 (ref 0.0–2.5)
Total CK: 507 U/L — ABNORMAL HIGH (ref 7–177)
Total CK: 509 U/L — ABNORMAL HIGH (ref 7–177)
Troponin I: <0.3 ng/mL (ref <0.30)
Troponin I: <0.3 ng/mL (ref <0.30)

## 2011-06-17 LAB — DIFFERENTIAL
Basophils Absolute: 0 10*3/uL (ref 0.0–0.1)
Basophils Relative: 0 % (ref 0–1)
Eosinophils Relative: 3 % (ref 0–5)
Lymphocytes Relative: 37 % (ref 12–46)
Monocytes Absolute: 0.4 10*3/uL (ref 0.1–1.0)
Monocytes Relative: 5 % (ref 3–12)
Neutro Abs: 5.2 10*3/uL (ref 1.7–7.7)
Neutro Abs: 4.9 10*3/uL (ref 1.7–7.7)
Neutrophils Relative %: 55 % (ref 43–77)

## 2011-06-17 LAB — POCT I-STAT, CHEM 8
Chloride: 105 mEq/L (ref 96–112)
Hemoglobin: 13.3 g/dL (ref 12.0–15.0)
TCO2: 27 mmol/L (ref 0–100)

## 2011-06-17 LAB — CK TOTAL AND CKMB (NOT AT ARMC): CK, MB: 11.8 ng/mL (ref 0.3–4.0)

## 2011-06-17 LAB — URINALYSIS, ROUTINE W REFLEX MICROSCOPIC
Bilirubin Urine: NEGATIVE
Nitrite: NEGATIVE
Specific Gravity, Urine: 1.012 (ref 1.005–1.030)
pH: 6.5 (ref 5.0–8.0)

## 2011-06-17 LAB — GLUCOSE, CAPILLARY
Glucose-Capillary: 228 mg/dL — ABNORMAL HIGH (ref 70–99)
Glucose-Capillary: 300 mg/dL — ABNORMAL HIGH (ref 70–99)

## 2011-06-17 LAB — LIPID PANEL
LDL Cholesterol: UNDETERMINED mg/dL (ref 0–99)
Triglycerides: 454 mg/dL — ABNORMAL HIGH (ref <150)
VLDL: UNDETERMINED mg/dL (ref 0–40)

## 2011-06-17 LAB — TSH: TSH: 1.741 u[IU]/mL (ref 0.350–4.500)

## 2011-06-17 LAB — POCT I-STAT TROPONIN I: Troponin i, poc: 0.01 ng/mL (ref 0.00–0.08)

## 2011-06-17 LAB — URINE MICROSCOPIC-ADD ON

## 2011-06-18 DIAGNOSIS — M79609 Pain in unspecified limb: Secondary | ICD-10-CM

## 2011-06-18 DIAGNOSIS — I5043 Acute on chronic combined systolic (congestive) and diastolic (congestive) heart failure: Secondary | ICD-10-CM

## 2011-06-18 DIAGNOSIS — I472 Ventricular tachycardia, unspecified: Secondary | ICD-10-CM

## 2011-06-18 DIAGNOSIS — I509 Heart failure, unspecified: Secondary | ICD-10-CM

## 2011-06-18 LAB — GLUCOSE, CAPILLARY
Glucose-Capillary: 239 mg/dL — ABNORMAL HIGH (ref 70–99)
Glucose-Capillary: 358 mg/dL — ABNORMAL HIGH (ref 70–99)
Glucose-Capillary: 312 mg/dL — ABNORMAL HIGH (ref 70–99)

## 2011-06-18 LAB — BASIC METABOLIC PANEL
CO2: 25 mEq/L (ref 19–32)
Chloride: 102 mEq/L (ref 96–112)
Creatinine, Ser: 1.42 mg/dL — ABNORMAL HIGH (ref 0.50–1.10)
Glucose, Bld: 237 mg/dL — ABNORMAL HIGH (ref 70–99)

## 2011-06-18 LAB — CARDIAC PANEL(CRET KIN+CKTOT+MB+TROPI)
CK, MB: 6.7 ng/mL (ref 0.3–4.0)
Relative Index: 1.8 (ref 0.0–2.5)
Relative Index: 1.9 (ref 0.0–2.5)
Troponin I: <0.3 ng/mL (ref <0.30)
Troponin I: <0.3 ng/mL (ref <0.30)
Troponin I: <0.3 ng/mL (ref <0.30)

## 2011-06-18 LAB — CBC
Hemoglobin: 11.7 g/dL — ABNORMAL LOW (ref 12.0–15.0)
MCH: 27 pg (ref 26.0–34.0)
Platelets: 216 10*3/uL (ref 150–400)
RBC: 4.33 MIL/uL (ref 3.87–5.11)
WBC: 7.9 10*3/uL (ref 4.0–10.5)

## 2011-06-18 LAB — AMYLASE, RANDOM URINE: Amylase, Ur: 40 IU/L

## 2011-06-18 LAB — URINE CULTURE
Colony Count: NO GROWTH
Culture  Setup Time: 201209031157
Culture: NO GROWTH

## 2011-06-18 LAB — PRO B NATRIURETIC PEPTIDE: Pro B Natriuretic peptide (BNP): 788.1 pg/mL — ABNORMAL HIGH (ref 0–125)

## 2011-06-18 LAB — MAGNESIUM: Magnesium: 1.8 mg/dL (ref 1.5–2.5)

## 2011-06-18 NOTE — H&P (Signed)
  NAMENAKIEA, METZNER NO.:  [id number]  MEDICAL RECORD NO.:  [id number]  LOCATION:  [facility]                         FACILITY:  [facility]  PHYSICIAN:  Eduard Clos, MDDATE OF BIRTH:  1941/07/14  DATE OF ADMISSION:  06/16/2011 DATE OF DISCHARGE:                             HISTORY & PHYSICAL   ADDENDUM:  PRIMARY CARE PHYSICIAN:  Burnell Blanks, MD  PRIMARY ENDOCRINOLOGIST:  Brooke Bonito, MD  PRIMARY CARDIOLOGIST:  [facility].  In addition to the other plan, the patient does have relatively low blood sugars running despite the patient not taking her Lantus at this time and keeping the patient on sliding coverage.  We will check a hemoglobin A1c as the patient is on usually high doses of Lantus.  We should be closely watching CBGs in the hospital to make sure her blood sugars are not remaining too high and further plans will be based on her CBG checks.     Eduard Clos, MD     ANK/MEDQ  D:  06/17/2011  T:  06/17/2011  Job:  [id number]  cc:   Brooke Bonito, M.D. Burnell Blanks, MD [facility]  Electronically Signed by Midge Minium MD on 06/18/2011 09:50:29 AM

## 2011-06-18 NOTE — H&P (Signed)
NAMECHYNNA, [name]:  [id number]  MEDICAL RECORD NO.:  [id number]  LOCATION:  [facility]                         FACILITY:  [facility]  PHYSICIAN:  Eduard Clos, MDDATE OF BIRTH:  [birth year]  DATE OF ADMISSION:  06/16/2011 DATE OF DISCHARGE:                             HISTORY & PHYSICAL   PRIMARY CARE PHYSICIAN:  Burnell Blanks, MD  PRIMARY ENDOCRINOLOGIST:  Brooke Bonito, MD  PRIMARY CARDIOLOGIST:  At [facility].  CHIEF COMPLAINT:  Low blood sugars and left foot pain.  HISTORY OF PRESENTING ILLNESS:  A 70 year old female with history of CAD status post CABG, diabetes mellitus type 2 on Lantus, hypertension, hyperlipidemia.  Has had some skin thickening on the left foot on the plantar aspect for which the patient applied some plaster which she removed after which started peeling of the skin and she had followup with her endocrinologist 3 days ago.  The area was tender and had small ulcer-like lesion.  Dr. Juleen China had prescribed a Keflex.  She started taking tablets and secondary to that appetite decreased and her blood sugars were found to be decreasing.  She did not take her insulin yesterday morning and evening.  She usually takes 40 of Lantus twice daily with 10 of NovoLog b.i.d.  Her sugars initially were 79 and 69, so she came to the ER.  The patient also had some subjective feeling of wheezing and palpitations off and on over the last 2 days.  Denies any chest pain.  Denies any fever, chills, cough, or phlegm.  Denies any diaphoresis.  Denies any headache, visual symptoms, or any abdominal pain, dysuria, discharges, diarrhea, or any focal deficit.  In the ER, the patient's blood sugars were found be 97.  This is despite the patient not taking her Lantus, and in addition a chest x-ray was showing some mild interstitial edema.  The patient has been given Lasix 40 at this time and is admitted for further workup.  Her  cardiac enzymes, troponins were negative.  CK is slightly high.  EKG is showing some nonspecific changes.  PAST MEDICAL HISTORY: 1. CAD status post CABG. 2. Hypertension. 3. Diabetes mellitus type 2 on insulin. 4. Hyperlipidemia.  PAST SURGICAL HISTORY:  CABG and tubal ligation.  MEDICATIONS PRIOR TO ADMISSION:  The patient is on: 1. Atorvastatin 40 mg daily. 2. Coreg 12.5 mg p.o. b.i.d. 3. Keflex which was started 2 days ago. 4. Clopidogrel 75 mg daily. 5. Digoxin 0.125 mg p.o. daily. 6. Folic acid 1 mg daily. 7. Furosemide 40 mg daily. 8. Hydrocodone/acetaminophen 7.5/750 as needed. 9. Lisinopril 40 mg daily. 10.Lovaza 1000 mg daily. 11.Niaspan 500 mg daily. 12.Omeprazole 20 mg daily. 13.Potassium chloride 20 mg p.o. daily.  ALLERGIES:  ASPIRIN which causes hives.  SOCIAL HISTORY:  The patient denies smoking cigarettes, drinking alcohol, or using illegal drugs.  FAMILY HISTORY:  Father died of skin cancer at age 13.  Mother died at 54 from kidney cancer.  REVIEW OF SYSTEMS:  As present in history of presenting illness. Nothing else significant.  PHYSICAL EXAMINATION:  GENERAL:  The patient examined at bedside, not in acute distress. VITAL SIGNS:  Blood  pressure is 177/89, pulse 60 per minute, temperature 97.8, respirations 18 per minute, O2 sat 97%. HEENT:  Anicteric.  No pallor.  No discharge from ears, eyes, nose, or mouth. CHEST:  Bilateral air entry present.  No rhonchi.  No crepitation. HEART:  S1, S2 heard. ABDOMEN:  Soft, nontender.  Bowel sounds.  No guarding.  No rigidity. CNS:  Alert, awake, oriented to time, place, and person.  Moves upper and lower extremities 5/5. EXTREMITIES:  There is tenderness in the plantar aspect of left foot with a small millimeter ulceration with no active discharge.  The tenderness is around the small ulcerated area at least around 1 cm in diameter.  There is no tenderness on the dorsal aspect.  There is no acute ischemic  changes, cyanosis, or clubbing.  LABORATORY DATA:  EKG shows normal sinus rhythm with right bundle-branch block with nonspecific ST-T changes.  Heart rate around 63 beats per minute.  Chest x-ray shows mild congestive changes in the heart and lungs with suggestion of mild interstitial edema scattered calcific granulomas on the right.  CBC:  WBC 9.9, hemoglobin is 13.3, hematocrit is 39, platelets 237.  Basic metabolic panel:  Sodium 142, potassium 4.4, chloride 105, carbon dioxide 27, glucose 97, BUN 26, creatinine 1.6, CK is 570, MB is 11.8.  Relative index 2.1.  Troponin 0.01. Digoxin 0.7.  ASSESSMENT: 1. Congestive heart failure, unspecified 2. Left foot cellulitis. 3. History of diabetes mellitus type 2 on insulin, presently running     low blood sugars with poor appetite. 4. Coronary artery disease status post coronary artery bypass graft. 5. History of hypertension. 6. History of hyperlipidemia.  PLAN: 1. At this time, admit the patient to telemetry. 2. For her CHF, at this time we are going to continue with the Lasix     40 IV daily with strict intake, output, daily weight.  We will     continue the digoxin and lisinopril for now along with the Coreg.     If the creatinine is worsening then we have to hold off the     lisinopril and we will also have to adjust the dose of digoxin.  We     will get a 2-D echo. 3. Cellulitis of the left foot.  At this time, I am going to get an x-     ray to make sure there is no osteomyelitis.  Until then, I am going     to keep the patient on vanc and Cipro and may slowly de-escalated. 4. Acute renal failure.  At this time, we are going to follow strict     intake, output, and daily weights.  I am going to get a urine     sodium and urine eosinophils, urine creatinine, and also a stat     urinalysis to make sure there is no casts. 5. I am going to consult Cardiology. 6. We will place the patient on nitroglycerin paste also. 7. Further  recommendation as condition evolves.     Eduard Clos, MD     ANK/MEDQ  D:  06/17/2011  T:  06/17/2011  Job:  [id number]  Electronically Signed by Midge Minium MD on 06/18/2011 09:50:37 AM

## 2011-06-19 DIAGNOSIS — M79609 Pain in unspecified limb: Secondary | ICD-10-CM

## 2011-06-19 LAB — DIFFERENTIAL
Basophils Relative: 0 % (ref 0–1)
Eosinophils Absolute: 0.4 10*3/uL (ref 0.0–0.7)
Neutrophils Relative %: 51 % (ref 43–77)

## 2011-06-19 LAB — BASIC METABOLIC PANEL
CO2: 27 mEq/L (ref 19–32)
Glucose, Bld: 341 mg/dL — ABNORMAL HIGH (ref 70–99)
Potassium: 4.4 mEq/L (ref 3.5–5.1)
Sodium: 137 mEq/L (ref 135–145)

## 2011-06-19 LAB — GLUCOSE, CAPILLARY: Glucose-Capillary: 411 mg/dL — ABNORMAL HIGH (ref 70–99)

## 2011-06-19 LAB — CBC
Hemoglobin: 10.9 g/dL — ABNORMAL LOW (ref 12.0–15.0)
MCH: 27 pg (ref 26.0–34.0)
Platelets: 214 10*3/uL (ref 150–400)
RBC: 4.04 MIL/uL (ref 3.87–5.11)
RBC: 4.14 MIL/uL (ref 3.87–5.11)
WBC: 7.6 10*3/uL (ref 4.0–10.5)

## 2011-06-19 LAB — COMPREHENSIVE METABOLIC PANEL
ALT: 19 U/L (ref 0–35)
AST: 18 U/L (ref 0–37)
CO2: 27 mEq/L (ref 19–32)
Calcium: 9.2 mg/dL (ref 8.4–10.5)
Chloride: 102 mEq/L (ref 96–112)
GFR calc non Af Amer: 30 mL/min — ABNORMAL LOW (ref >60)
Sodium: 138 mEq/L (ref 135–145)
Total Bilirubin: 0.2 mg/dL — ABNORMAL LOW (ref 0.3–1.2)

## 2011-06-19 LAB — GLUCOSE, RANDOM: Glucose, Bld: 392 mg/dL — ABNORMAL HIGH (ref 70–99)

## 2011-06-19 LAB — SEDIMENTATION RATE: Sed Rate: 35 mm/hr — ABNORMAL HIGH (ref 0–22)

## 2011-06-19 LAB — CEA: CEA: 2.5 ng/mL (ref 0.0–5.0)

## 2011-06-20 ENCOUNTER — Inpatient Hospital Stay (HOSPITAL_COMMUNITY): Payer: Medicare Other

## 2011-06-20 DIAGNOSIS — I70219 Atherosclerosis of native arteries of extremities with intermittent claudication, unspecified extremity: Secondary | ICD-10-CM

## 2011-06-20 LAB — GLUCOSE, CAPILLARY
Glucose-Capillary: 269 mg/dL — ABNORMAL HIGH (ref 70–99)
Glucose-Capillary: 256 mg/dL — ABNORMAL HIGH (ref 70–99)

## 2011-06-20 LAB — BASIC METABOLIC PANEL
BUN: 32 mg/dL — ABNORMAL HIGH (ref 6–23)
Chloride: 104 mEq/L (ref 96–112)
GFR calc Af Amer: 43 mL/min — ABNORMAL LOW (ref >60)
GFR calc non Af Amer: 35 mL/min — ABNORMAL LOW (ref >60)
Glucose, Bld: 278 mg/dL — ABNORMAL HIGH (ref 70–99)
Potassium: 4.3 mEq/L (ref 3.5–5.1)
Sodium: 137 mEq/L (ref 135–145)

## 2011-06-20 LAB — CBC
MCV: 83.4 fL (ref 78.0–100.0)
WBC: 8 10*3/uL (ref 4.0–10.5)

## 2011-06-20 LAB — PROTIME-INR: Prothrombin Time: 13.1 seconds (ref 11.6–15.2)

## 2011-06-20 LAB — C-REACTIVE PROTEIN: CRP: 0.68 mg/dL — ABNORMAL HIGH (ref <0.60)

## 2011-06-21 DIAGNOSIS — I2510 Atherosclerotic heart disease of native coronary artery without angina pectoris: Secondary | ICD-10-CM

## 2011-06-21 LAB — DIFFERENTIAL
Eosinophils Absolute: 0.4 10*3/uL (ref 0.0–0.7)
Eosinophils Relative: 4 % (ref 0–5)
Lymphocytes Relative: 37 % (ref 12–46)
Lymphs Abs: 2.9 10*3/uL (ref 0.7–4.0)
Monocytes Absolute: 0.5 10*3/uL (ref 0.1–1.0)

## 2011-06-21 LAB — VANCOMYCIN, TROUGH: Vancomycin Tr: 32.8 ug/mL (ref 10.0–20.0)

## 2011-06-21 LAB — CBC
HCT: 34.4 % — ABNORMAL LOW (ref 36.0–46.0)
MCH: 27.4 pg (ref 26.0–34.0)
MCHC: 32.8 g/dL (ref 30.0–36.0)
MCV: 83.3 fL (ref 78.0–100.0)
Platelets: 226 10*3/uL (ref 150–400)
RDW: 13.7 % (ref 11.5–15.5)

## 2011-06-21 LAB — GLUCOSE, CAPILLARY
Glucose-Capillary: 256 mg/dL — ABNORMAL HIGH (ref 70–99)
Glucose-Capillary: 305 mg/dL — ABNORMAL HIGH (ref 70–99)
Glucose-Capillary: 236 mg/dL — ABNORMAL HIGH (ref 70–99)
Glucose-Capillary: 326 mg/dL — ABNORMAL HIGH (ref 70–99)

## 2011-06-21 LAB — COMPREHENSIVE METABOLIC PANEL
Albumin: 2.8 g/dL — ABNORMAL LOW (ref 3.5–5.2)
BUN: 31 mg/dL — ABNORMAL HIGH (ref 6–23)
Calcium: 9.1 mg/dL (ref 8.4–10.5)
Creatinine, Ser: 1.56 mg/dL — ABNORMAL HIGH (ref 0.50–1.10)
Total Bilirubin: 0.2 mg/dL — ABNORMAL LOW (ref 0.3–1.2)
Total Protein: 6.4 g/dL (ref 6.0–8.3)

## 2011-06-22 DIAGNOSIS — M79609 Pain in unspecified limb: Secondary | ICD-10-CM

## 2011-06-22 LAB — GLUCOSE, CAPILLARY
Glucose-Capillary: 236 mg/dL — ABNORMAL HIGH (ref 70–99)
Glucose-Capillary: 304 mg/dL — ABNORMAL HIGH (ref 70–99)
Glucose-Capillary: 214 mg/dL — ABNORMAL HIGH (ref 70–99)

## 2011-06-23 LAB — CBC
HCT: 33 % — ABNORMAL LOW (ref 36.0–46.0)
MCHC: 32.7 g/dL (ref 30.0–36.0)
Platelets: 217 10*3/uL (ref 150–400)
RDW: 13.4 % (ref 11.5–15.5)

## 2011-06-23 LAB — GLUCOSE, CAPILLARY
Glucose-Capillary: 279 mg/dL — ABNORMAL HIGH (ref 70–99)
Glucose-Capillary: 228 mg/dL — ABNORMAL HIGH (ref 70–99)

## 2011-06-23 LAB — BASIC METABOLIC PANEL
BUN: 34 mg/dL — ABNORMAL HIGH (ref 6–23)
Creatinine, Ser: 1.73 mg/dL — ABNORMAL HIGH (ref 0.50–1.10)
GFR calc Af Amer: 35 mL/min — ABNORMAL LOW (ref >60)
GFR calc non Af Amer: 29 mL/min — ABNORMAL LOW (ref >60)
Potassium: 4.4 mEq/L (ref 3.5–5.1)

## 2011-06-24 DIAGNOSIS — Z01810 Encounter for preprocedural cardiovascular examination: Secondary | ICD-10-CM

## 2011-06-24 LAB — BASIC METABOLIC PANEL
CO2: 26 mEq/L (ref 19–32)
Glucose, Bld: 217 mg/dL — ABNORMAL HIGH (ref 70–99)
Potassium: 4.3 mEq/L (ref 3.5–5.1)
Sodium: 138 mEq/L (ref 135–145)

## 2011-06-24 LAB — GLUCOSE, CAPILLARY
Glucose-Capillary: 413 mg/dL — ABNORMAL HIGH (ref 70–99)
Glucose-Capillary: 393 mg/dL — ABNORMAL HIGH (ref 70–99)

## 2011-06-24 NOTE — Op Note (Signed)
[name], [name]:  [id number]  MEDICAL RECORD NO.:  [id number]  LOCATION:  3731                         FACILITY:  [facility]  PHYSICIAN:  Fransisco Hertz, MD       DATE OF BIRTH:  [birth year]  DATE OF PROCEDURE:  06/20/2011 DATE OF DISCHARGE:                              OPERATIVE REPORT   PROCEDURE: 1. Bilateral common femoral artery cannulation under ultrasound     guidance. 2. Aortogram. 3. Bilateral leg runoff.  PREOPERATIVE DIAGNOSIS:  Left leg critical limb ischemia bilateral lower extremity peripheral arterial disease.  POSTOPERATIVE DIAGNOSIS:  Left leg critical limb ischemia bilateral lower extremity peripheral arterial disease.  SURGEON:  Fransisco Hertz, MD.  ESTIMATED BLOOD LOSS:  Minimal.  ANESTHESIA:  Conscious sedation.  CONTRAST:  125 mL.  SPECIMENS:  None.  FINDINGS: 1. Patent aorta. 2. Patent bilateral renal arteries with obvious nephrograms. 3. Patent bilateral common iliac arteries, external iliac arteries and     internal iliac arteries. 4. There is a left common iliac artery stenosis that is less than 50%     with a less than 10 gradient across it. 5. There is a patent bilateral common femoral arteries, superficial     femoral arteries and profunda arteries. 6. Bilateral SFAs have high-grade stenoses within them. 7. There is a high-grade left popliteal artery stenosis at the level     of the knee. 8. The right anterior tibial artery occludes but the peroneal and     posterior tibial are patent on this side providing runoff to the     right foot. 9. The left trifurcation is patent.  The posterior tibial occludes     about 3 cm after the proximal takeoff. 10.The left anterior artery has a stenosis proximally and is the     primary runoff to the left foot.  INDICATIONS:  This is a 70 year old patient with multiple comorbidities who presents with left leg critical limb ischemia that was felt needed a diagnostic angiogram to  determine the inflow and distal target for a possible bypass.  The patient is aware of the risks of this procedure include bleeding, infection, possible renal failure, possible embolization, possible dissection, possible rupture, and possible need for additional procedures.  She is aware of these risks and agreed to proceed forward.  DESCRIPTION OF OPERATION:  After full informed written consent was obtained from the patient, she was brought back to the angio suite and placed supine upon the angio table.  Prior to sedation, she was connected to monitoring equipment and given conscious sedation the amounts of which are as listed in her chart.  She was then prepped and draped in the standard fashion for aortogram, bilateral leg runoff.  I turned my attention first to her right groin.  The artery on this side appeared to be quite diseased under ultrasound guidance and small.  I was able to cannulate with micropuncture needle, but despite a couple of attempts, I could not pass the micro wire up into it.  I made one more attempt on this side and I still was not able to successfully obtain access on this side.  Subsequently,  I went to the left side which was a larger artery. Under ultrasound guidance, I cannulated it multiple times but I was not able to pass the wire up successfully.  I was forced to subsequently stick the artery a little higher than usual near the inguinal ligament. I was able to cannulate it with the micro needle and then passed a micro wire up into the iliac system.  I then exchanged the needle for the micro sheath which I advanced successfully into the iliac system.  A Bentson wire was then loaded.  The sheath was exchanged then for a short 5-French sheath.  I then loaded an Omni-flush catheter over this wire up to the level of L1.  The wire was removed, the catheter was connected to power injector circuit.  After performing declotting and de-airing maneuver, a power  injector aortogram was completed, then pulled the catheter down to the bifurcation and did automated bilateral leg runoff. At this point, there was significant underfilling of the left tibials that however with digital enhancement of the images actually demonstrated that there was primarily an anterior tibial runoff in this patient with a possible proximal stenosis.  I then had her left foot repositioned and the lateral foot to confirm that in fact the primary runoff to this left foot is anterior tibial artery.  Unfortunately, as she has severe right common femoral disease, I was not able to successfully cross from her right side and I could not intervene subsequently easily on this side.  Based on the images, the patient is either a candidate for right fem-pop or a right fem to anterior tibial.  I think to some degree the decision will have to be made based on intraoperative exploration of the anterior tibial artery, if there was not significant disease here, it would likely be to this patient's advantage to proceed forward with a fem-pop bypass.  At this point, we replaced the Bentson wire into the Omni-flush catheter, reconstituted the crook of thecatheter, and pulled out the catheter and wire.  I aspirated out the left femoral sheath and instilled heparinized saline into it.  The patient tolerated the procedure fine.  COMPLICATIONS:  None.  CONDITION:  Stable.     Fransisco Hertz, MD     BLC/MEDQ  D:  06/20/2011  T:  06/20/2011  Job:  [id number]  Electronically Signed by Leonides Sake MD on 06/24/2011 10:50:27 AM

## 2011-06-25 LAB — BASIC METABOLIC PANEL
BUN: 32 mg/dL — ABNORMAL HIGH (ref 6–23)
Chloride: 104 mEq/L (ref 96–112)
GFR calc Af Amer: 44 mL/min — ABNORMAL LOW (ref >60)
Glucose, Bld: 212 mg/dL — ABNORMAL HIGH (ref 70–99)
Potassium: 4.5 mEq/L (ref 3.5–5.1)

## 2011-06-25 LAB — GLUCOSE, CAPILLARY: Glucose-Capillary: 183 mg/dL — ABNORMAL HIGH (ref 70–99)

## 2011-06-25 LAB — CBC
HCT: 33.1 % — ABNORMAL LOW (ref 36.0–46.0)
Hemoglobin: 11 g/dL — ABNORMAL LOW (ref 12.0–15.0)
WBC: 8.8 10*3/uL (ref 4.0–10.5)

## 2011-06-26 ENCOUNTER — Inpatient Hospital Stay (HOSPITAL_COMMUNITY): Payer: Medicare Other

## 2011-06-26 DIAGNOSIS — L98499 Non-pressure chronic ulcer of skin of other sites with unspecified severity: Secondary | ICD-10-CM

## 2011-06-26 DIAGNOSIS — I739 Peripheral vascular disease, unspecified: Secondary | ICD-10-CM

## 2011-06-26 HISTORY — PX: PR VEIN BYPASS GRAFT,AORTO-FEM-POP: 35551

## 2011-06-26 LAB — CBC
HCT: 31.8 % — ABNORMAL LOW (ref 36.0–46.0)
Hemoglobin: 10.6 g/dL — ABNORMAL LOW (ref 12.0–15.0)
MCV: 83.2 fL (ref 78.0–100.0)
RDW: 13.7 % (ref 11.5–15.5)
WBC: 8.5 10*3/uL (ref 4.0–10.5)

## 2011-06-26 LAB — GLUCOSE, CAPILLARY
Glucose-Capillary: 221 mg/dL — ABNORMAL HIGH (ref 70–99)
Glucose-Capillary: 204 mg/dL — ABNORMAL HIGH (ref 70–99)

## 2011-06-26 LAB — BASIC METABOLIC PANEL
Chloride: 103 mEq/L (ref 96–112)
GFR calc Af Amer: 32 mL/min — ABNORMAL LOW (ref >60)
GFR calc non Af Amer: 27 mL/min — ABNORMAL LOW (ref >60)
Potassium: 4.5 mEq/L (ref 3.5–5.1)
Sodium: 136 mEq/L (ref 135–145)

## 2011-06-26 LAB — SURGICAL PCR SCREEN
MRSA, PCR: NEGATIVE
Staphylococcus aureus: NEGATIVE

## 2011-06-27 DIAGNOSIS — Z48812 Encounter for surgical aftercare following surgery on the circulatory system: Secondary | ICD-10-CM

## 2011-06-27 LAB — CBC
MCH: 26.8 pg (ref 26.0–34.0)
MCHC: 32.4 g/dL (ref 30.0–36.0)
MCV: 82.9 fL (ref 78.0–100.0)
Platelets: 226 10*3/uL (ref 150–400)
RDW: 13.9 % (ref 11.5–15.5)

## 2011-06-27 LAB — GLUCOSE, CAPILLARY
Glucose-Capillary: 251 mg/dL — ABNORMAL HIGH (ref 70–99)
Glucose-Capillary: 194 mg/dL — ABNORMAL HIGH (ref 70–99)

## 2011-06-27 LAB — BASIC METABOLIC PANEL
BUN: 31 mg/dL — ABNORMAL HIGH (ref 6–23)
Calcium: 8.8 mg/dL (ref 8.4–10.5)
Creatinine, Ser: 1.48 mg/dL — ABNORMAL HIGH (ref 0.50–1.10)
GFR calc non Af Amer: 35 mL/min — ABNORMAL LOW (ref >60)
Glucose, Bld: 145 mg/dL — ABNORMAL HIGH (ref 70–99)

## 2011-06-28 LAB — BASIC METABOLIC PANEL
BUN: 35 mg/dL — ABNORMAL HIGH (ref 6–23)
Chloride: 101 mEq/L (ref 96–112)
GFR calc non Af Amer: 28 mL/min — ABNORMAL LOW (ref >60)
Glucose, Bld: 215 mg/dL — ABNORMAL HIGH (ref 70–99)
Potassium: 4.7 mEq/L (ref 3.5–5.1)

## 2011-06-28 LAB — CBC
MCV: 83.3 fL (ref 78.0–100.0)
Platelets: 207 10*3/uL (ref 150–400)
RDW: 14 % (ref 11.5–15.5)
WBC: 10.2 10*3/uL (ref 4.0–10.5)

## 2011-06-28 LAB — URINALYSIS, MICROSCOPIC ONLY
Leukocytes, UA: NEGATIVE
Nitrite: NEGATIVE
Specific Gravity, Urine: 1.021 (ref 1.005–1.030)
pH: 5 (ref 5.0–8.0)

## 2011-06-28 LAB — GLUCOSE, CAPILLARY
Glucose-Capillary: 264 mg/dL — ABNORMAL HIGH (ref 70–99)
Glucose-Capillary: 262 mg/dL — ABNORMAL HIGH (ref 70–99)

## 2011-06-29 LAB — CBC
MCH: 27.4 pg (ref 26.0–34.0)
MCV: 83 fL (ref 78.0–100.0)
Platelets: 228 10*3/uL (ref 150–400)
RDW: 13.8 % (ref 11.5–15.5)

## 2011-06-29 LAB — BASIC METABOLIC PANEL
BUN: 43 mg/dL — ABNORMAL HIGH (ref 6–23)
CO2: 24 mEq/L (ref 19–32)
Calcium: 8.6 mg/dL (ref 8.4–10.5)
Creatinine, Ser: 1.99 mg/dL — ABNORMAL HIGH (ref 0.50–1.10)

## 2011-06-29 LAB — PROTIME-INR: Prothrombin Time: 20.7 seconds — ABNORMAL HIGH (ref 11.6–15.2)

## 2011-06-29 LAB — GLUCOSE, CAPILLARY

## 2011-06-29 NOTE — Consult Note (Signed)
[name], [name]:  [id number]  MEDICAL RECORD NO.:  [id number]  LOCATION:  3731                         FACILITY:  [facility]  PHYSICIAN:  Landry Corporal, MD DATE OF BIRTH:  August 27, 1941  DATE OF CONSULTATION: DATE OF DISCHARGE:                                CONSULTATION   PRIMARY CARE PHYSICIAN:  Dr. Juleen China.  PRIMARY CARDIOLOGIST:  [facility].  She saw Dr. Mariah Milling prior to his departure.  CHIEF COMPLAINT:  Left foot pain.  HISTORY OF PRESENT ILLNESS:  Ms. [name] is a pleasant 70 year old African American female who was admitted to [facility] with cellulitis of left foot as well as low blood sugars.  She has a history of coronary artery disease status post CABG in 2009.  She underwent coronary artery bypass graft x5 at that time and since then has done quite well.  She does report that over the last several days she has had some mild shortness of breath, but denies any chest pain, pressure, or heaviness. Her left foot has been swollen, but she denies any swelling in her legs, feet, and ankles other than on the left and it has been primarily in her foot.  She denies any orthopnea or PND.  She occasionally experiences a palpitation, but no syncope or presyncope.  Her EKG done in the emergency department reveals sinus rhythm with right bundle-branch block which essentially is unchanged from her previous.  Her chest x-ray revealed mild congestive changes with suggestion of mild interstitial edema.  Her blood pressure was quite elevated as well and she is currently receiving labetalol for that.  Cardiac enzymes were done revealing a CK of 570, CK-MB of 11.8, and negative troponin.  Currently, she reports that she is feeling somewhat better and her foot pain has improved.  Again, she denies any chest pain or pressure.  PAST MEDICAL HISTORY: 1. Coronary atherosclerotic heart disease and is status post coronary     artery  bypass graft x5 in July 2009 with a LIMA to the LAD,     reversed saphenous vein graft to the second and first intermediate     coronary artery, reversed saphenous vein graft to the distal     circumflex, reversed saphenous vein graft to the right coronary     artery. 2. Decreased LV function in 2009 with an EF of 40-45%. 3. Hypertension. 4. Type 2 diabetes mellitus, insulin requiring. 5. Dyslipidemia. 6. Hypertension.  FAMILY HISTORY:  Negative for coronary artery disease.  SOCIAL HISTORY:  She denies any tobacco or alcohol.  ALLERGIES:  ASPIRIN causes hives.  CURRENT MEDICATIONS: 1. Atorvastatin 40 mg daily. 2. Coreg 12.5 mg b.i.d. 3. Plavix 75 mg daily. 4. Digoxin 0.125 mg daily. 5. Folic acid 1 mg daily. 6. Lasix 40 mg IV. 7. Hydrocodone p.r.n. 8. Lisinopril 40 mg daily. 9. Levaquin 1000 mg daily. 10.Niaspan 500 mg daily. 11.Omeprazole 20 mg daily. 12.Potassium 20 mEq daily. 13.She has been started on Rocephin and Cipro.  REVIEW OF SYSTEMS:  As per HPI, otherwise negative.  PHYSICAL EXAMINATION:  VITAL SIGNS:  Blood pressure is 173/74, pulse is 61, respirations 20, temperature is 98.1,  and her O2 sat is 97% on room air. GENERAL:  This is a very pleasant African American female in no acute distress. HEENT:  Pupils are equal and reactive to light and accommodation. Sclerae are not icteric.  Extraocular movements are intact. NECK:  Supple.  No JVD, carotid bruits, or thyromegaly. CARDIOVASCULAR:  Regular rate and rhythm.  S1 and S2 without appreciable murmur, gallop, or rub. LUNGS:  Diminished in the bases.  No wheezing, rales, or rhonchi. ABDOMEN:  Soft and nontender without splenomegaly or masses.  Bowel sounds are present x4. EXTREMITIES:  Radial, femoral, and dorsal pedal arteries are present. No lower extremity edema.  There is redness on the left plantar aspect of her foot as well as tenderness to palpation.  No edema.  Peripheral pulses are present. SKIN:   Warm and dry. NEUROLOGIC:  Oriented to person, place, and time.  Normal mood and affect.  Muscle strength 5/5 bilaterally.  Sensation is intact.  LABORATORY DATA:  EKG reveals sinus rhythm with left bundle-branch block.  IMPRESSION: 1. Mild congestive heart failure, question systolic versus diastolic. 2. Left foot cellulitis. 3. Type 2 diabetes mellitus. 4. History of hypertension. 5. History of hyperlipidemia. 6. Abnormal cardiac enzymes. 7. Renal insufficiency.  PLAN:  We will continue to cycle her enzymes q.8 h. x3.  We agree with the Lasix 40 mg, I's and O's and daily weights.  We will obtain echocardiogram to assess her LV function as well as for any structural abnormalities.  Her blood pressure is significantly elevated.  It is possible that some of her shortness of breath is secondary to diastolic dysfunction, however, we will follow up on the echocardiogram to determine that.  In the meantime, we would add Norvasc 5 mg daily to her regimen and given her heart rate is in the 60s would add hydralazine 10 mg IV p.r.n. systolic pressures greater than 160 along with labetalol.  We will follow up on her cardiac enzymes.  We will also check a BNP and a magnesium level.  We will follow along closely.    ______________________________ [employer], NP   I saw [name] the following morning.  She did not note any cardiac symptoms or concerns. No signs of CHF. She did indicated that she does intend to follow-up with Dr. Mariah Milling.  I have notified the [facility] service who has agreed to take over the consult.  Thank you.  ______________________________ Landry Corporal, MD    LS/MEDQ  D:  06/17/2011  T:  06/17/2011  Job:  [id number]  cc:   [facility] Dr. Juleen China  Electronically Signed by Charmian Muff NP on 06/25/2011 10:13:22 PM Electronically Signed by Bryan Lemma MD on 06/29/2011 09:42:29 PM

## 2011-06-30 LAB — CBC
Hemoglobin: 10.4 g/dL — ABNORMAL LOW (ref 12.0–15.0)
MCH: 28.3 pg (ref 26.0–34.0)
RBC: 3.68 MIL/uL — ABNORMAL LOW (ref 3.87–5.11)
WBC: 12.5 10*3/uL — ABNORMAL HIGH (ref 4.0–10.5)

## 2011-06-30 LAB — PROTIME-INR
INR: 1.61 — ABNORMAL HIGH (ref 0.00–1.49)
Prothrombin Time: 19.4 seconds — ABNORMAL HIGH (ref 11.6–15.2)

## 2011-06-30 LAB — BASIC METABOLIC PANEL
BUN: 53 mg/dL — ABNORMAL HIGH (ref 6–23)
CO2: 23 mEq/L (ref 19–32)
Chloride: 97 mEq/L (ref 96–112)
Creatinine, Ser: 1.96 mg/dL — ABNORMAL HIGH (ref 0.50–1.10)
GFR calc Af Amer: 31 mL/min — ABNORMAL LOW (ref >60)
Glucose, Bld: 211 mg/dL — ABNORMAL HIGH (ref 70–99)
Potassium: 5.4 mEq/L — ABNORMAL HIGH (ref 3.5–5.1)

## 2011-06-30 LAB — GLUCOSE, CAPILLARY: Glucose-Capillary: 136 mg/dL — ABNORMAL HIGH (ref 70–99)

## 2011-06-30 LAB — TYPE AND SCREEN
ABO/RH(D): A POS
Antibody Screen: NEGATIVE
Unit division: 0
Unit division: 0

## 2011-07-01 ENCOUNTER — Inpatient Hospital Stay (HOSPITAL_COMMUNITY): Payer: Medicare Other

## 2011-07-01 DIAGNOSIS — I951 Orthostatic hypotension: Secondary | ICD-10-CM

## 2011-07-01 DIAGNOSIS — R5381 Other malaise: Secondary | ICD-10-CM

## 2011-07-01 DIAGNOSIS — I739 Peripheral vascular disease, unspecified: Secondary | ICD-10-CM

## 2011-07-01 LAB — CBC
MCV: 83.1 fL (ref 78.0–100.0)
Platelets: 282 10*3/uL (ref 150–400)
RBC: 3.49 MIL/uL — ABNORMAL LOW (ref 3.87–5.11)
RDW: 13.7 % (ref 11.5–15.5)
WBC: 10.6 10*3/uL — ABNORMAL HIGH (ref 4.0–10.5)

## 2011-07-01 LAB — URINE MICROSCOPIC-ADD ON

## 2011-07-01 LAB — IRON AND TIBC
Iron: 27 ug/dL — ABNORMAL LOW (ref 42–135)
Saturation Ratios: 9 % — ABNORMAL LOW (ref 20–55)
TIBC: 293 ug/dL (ref 250–470)

## 2011-07-01 LAB — PROTIME-INR: Prothrombin Time: 20.7 seconds — ABNORMAL HIGH (ref 11.6–15.2)

## 2011-07-01 LAB — GLUCOSE, CAPILLARY
Glucose-Capillary: 221 mg/dL — ABNORMAL HIGH (ref 70–99)
Glucose-Capillary: 147 mg/dL — ABNORMAL HIGH (ref 70–99)
Glucose-Capillary: 169 mg/dL — ABNORMAL HIGH (ref 70–99)
Glucose-Capillary: 110 mg/dL — ABNORMAL HIGH (ref 70–99)

## 2011-07-01 LAB — BASIC METABOLIC PANEL
BUN: 64 mg/dL — ABNORMAL HIGH (ref 6–23)
Calcium: 8.7 mg/dL (ref 8.4–10.5)
GFR calc Af Amer: 26 mL/min — ABNORMAL LOW (ref >60)
GFR calc non Af Amer: 21 mL/min — ABNORMAL LOW (ref >60)
Glucose, Bld: 153 mg/dL — ABNORMAL HIGH (ref 70–99)
Potassium: 4.5 mEq/L (ref 3.5–5.1)
Sodium: 129 mEq/L — ABNORMAL LOW (ref 135–145)

## 2011-07-01 LAB — URINALYSIS, ROUTINE W REFLEX MICROSCOPIC
Leukocytes, UA: NEGATIVE
Nitrite: NEGATIVE
Protein, ur: 30 mg/dL — AB
Specific Gravity, Urine: 1.016 (ref 1.005–1.030)
Urobilinogen, UA: 0.2 mg/dL (ref 0.0–1.0)

## 2011-07-01 LAB — PROTEIN, URINE, RANDOM: Total Protein, Urine: 36.1 mg/dL

## 2011-07-01 NOTE — Op Note (Signed)
[name], [name]                  ACCOUNT NO.:  [id number]  MEDICAL RECORD NO.:  [id number]  LOCATION:  2550                         FACILITY:  [facility]  PHYSICIAN:  Quita Skye. Hart Rochester, M.D.  DATE OF BIRTH:  September 22, 1941  DATE OF PROCEDURE:  06/26/2011 DATE OF DISCHARGE:                              OPERATIVE REPORT   PREOPERATIVE DIAGNOSIS:  Ischemia of left foot with nonhealing ulcer left plantar surface due to femoral, popliteal, and tibial occlusive disease.  POSTOPERATIVE DIAGNOSIS:  Ischemia of left foot with nonhealing ulcer left plantar surface due to femoral, popliteal, and tibial occlusive disease.  OPERATION:  Left common femoral to popliteal (below knee) bypass using a nonreverse translocated saphenous vein graft from left leg with intraoperative arteriogram.  SURGEON:  Quita Skye. Hart Rochester, MD  FIRST ASSISTANT:  Della Goo, PA-C  ANESTHESIA:  General endotracheal.  PROCEDURE:  The patient was taken to the operating room, placed in the supine position at which time satisfactory general endotracheal anesthesia was administered.  The left leg was prepped with Betadine scrub and solution draped in routine sterile manner.  Saphenous vein was exposed and the saphenofemoral junction dissected free distally through multiple incisions along the medial aspect of the left leg.  It was a good vein down to just below the knee and that tapered down to a smaller size.  Its branch was ligated with 4-0 and 5-0 silk ties and divided, it was removed, gently dilated with heparinized saline, marked for orientation purposes.  The vein was about 3 to 3.5 mm in size throughout most of its length.  The common superficial and profunda femoris arteries were dissected free in the inguinal incision.  There was diffuse disease in all vessels posterior plaque, and the common femoral artery, although there was a good pulse anteriorly.  The disease did extend up to the inguinal ligament,  superficial femoral artery was chronically occluded.  There was disease in the profunda.  The popliteal artery was exposed in the below-knee position and also was a diffusely diseased vessel, but widely patent on angiogram.  There was significant tibial disease in all vessels.  Subfascial anatomic tunnel was created and the patient was then heparinized.  Femoral vessels occluded with vascular clamps in the groin.  Longitudinal opening made in the common femoral artery with 15 blade, extended with Potts scissors.  Vein was spatulated and anastomosed end-to-side with 6-0 Prolene.  Clamp was then released.  There was an excellent pulse down the first set of competent valves.  Using a retrograde valvulotome about the valves were rendered incompetent with resultant excellent flow of the distal end of the vein graft.  Vein was carefully delivered through the tunnel, popliteal artery occluded proximally and distally and below the knee opened with 15 blade and extended with Potts scissors.  There was plaque formation but it was about a 3 mm vessel distally and had sluggish backbleeding. The vein was able to be used in its largest area without having the use the tapered portion having just enough length.  Vein was carefully spatulated and anastomosed end-to-side with 6-0 Prolene.  Clamp was released.  There was good pulse in the vein graft  and much improved Doppler flow in the popliteal artery.  Intraoperative arteriogram was performed, which revealed severe tibial disease with occlusion of the posterior tibial artery, severe tapering of the peroneal, and the anterior tibial artery was very diseased as well, but did reconstitute distally on the preoperative angiogram, but was occluded at the ankle.  No protamine was given.  The wound was irrigated with saline, closed in layers with Vicryl in a subcuticular fashion. Sterile dressing applied.  The patient was taken to the recovery room in a  satisfactory condition.     Quita Skye Hart Rochester, M.D.     JDL/MEDQ  D:  06/26/2011  T:  06/26/2011  Job:  [id number]  Electronically Signed by Josephina Gip M.D. on 07/01/2011 02:19:39 PM

## 2011-07-01 NOTE — Consult Note (Signed)
[name], [name]                  ACCOUNT NO.:  [id number]  MEDICAL RECORD NO.:  [id number]  LOCATION:                                 FACILITY:  PHYSICIAN:  Quita Skye. Hart Rochester, M.D.  DATE OF BIRTH:  [birth year]  DATE OF CONSULTATION: DATE OF DISCHARGE:                                CONSULTATION   CHIEF COMPLAINT:  "My left foot hurts."  HISTORY OF PRESENT ILLNESS:  This is a 70 year old African American female with a history of coronary artery disease and status post CABG in July 2009 with right saphenous vein harvest.  She also has a past medical history of diabetes, hypertension, and hyperlipidemia.  She states she did have some skin thickening on the plantar surface of her left foot and she applied a plantar wart pad and left it for 3 days. When she removed it, her foot started peeling and now has an ulcer.  She states that this started to "bubble up" and she thought this was infected.  She went to Dr. Juleen [name] last week, and he prescribed herKeflex.  She did have a decreased appetite.  She had decreased food intake and secondary to that, her blood sugars decreased.  Since being in the hospital, she did have ABIs done which revealed 0.51 on the right and 0.20 on the left.  The patient also states that she soaked her foot in Betadine for several days.  [facility] was consulted.  PAST MEDICAL/SURGICAL HISTORY: 1. Left foot ulcer. 2. Diabetes. 3. Coronary artery disease.     a.     Status post CABG in July 2009 with right saphenous vein      harvest. 4. Hypertension. 5. Hyperlipidemia. 6. History of bilateral tubal ligation.  ALLERGIES:  ASPIRIN causes hives.  CURRENT MEDICATIONS:  Norvasc, Coreg, IV Cipro, Plavix, Lovenox, folic acid, Lasix, Lantus, lisinopril, Niaspan ER, Lovaza, Protonix, K-Dur, Crestor, vancomycin.  FAMILY HISTORY:  Her father died at age 35 with skin cancer.  Her mother died at age 52 with renal cancer.  SOCIAL HISTORY:  She denies current tobacco and EtOH  use but does have a remote history of tobacco.  REVIEW OF SYSTEMS:  GENERAL:  She denies any fever or chills.  HEART: Positive for history of MI in 2009 before her CABG.  LUNGS:  She denies any hemoptysis or productive cough.  HEENT:  She denies any vision changes.  VASCULAR:  She denies any claudication, rest pain, numbness, or tingling in her lower extremities.  NEUROLOGIC:  She denies any history of CVA.  EXTREMITIES:  She has a left plantar foot sore.  She denies any drainage.  She does have a history of right saphenous vein harvest for CABG.  PHYSICAL EXAMINATION:  VITAL SIGNS:  Her blood pressure is 158/56, heart rate 64 and regular.  She is afebrile and 99% on room air. GENERAL:  She is in no acute distress. HEENT:  She is normocephalic.  Pupils equal, round, reactive to light. NEUROLOGIC:  There are no focal defects and is intact. HEART:  Regular rate and rhythm.  No carotid bruits were heard. LUNGS:  Clear to auscultation bilaterally. ABDOMEN:  Soft, nontender, nondistended with  positive bowel sounds. EXTREMITIES:  She has 2+ palpable femoral pulses bilaterally.  She has a Doppler signal on the right dorsalis pedal pulse as well as posterior tibialis.  She has a positive Doppler dorsalis pedal pulse on the left but no posterior tibialis signal.  There are no palpable popliteal pulses.  She has good capillary refill bilaterally.  The plantar surface of her foot does have a small sore, approximately size of a nickel.  She does have some discoloration, questionable erythema versus Betadine stain from where she was soaking her foot in Betadine before admission. Her left foot plantar surface is very tender to touch.  LABORATORY DATA:  White blood cell count is 8.1, hemoglobin 10.9, hematocrit 33.7, platelets 211.  Sodium is 137, potassium is 4.4, BUN and creatinine are 34 and 1.7 respectively, glucose is 341.  ASSESSMENT AND PLAN:  This is a 70 year old African American female  with a nonhealing sore on the left plantar surface of her left foot.  Her ABIs were severely decreased on the left.  She may need arteriogram to determine extent of her peripheral arterial disease but will most likely need hydration therapy due to increased BUN and creatinine before her arteriogram.     Rhonda Pigg, PA   ______________________________ Quita Skye. Hart Rochester, M.D.    SE/MEDQ  D:  06/19/2011  T:  06/19/2011  Job:  [id number]  Electronically Signed by Rhonda Pigg PA on 06/27/2011 12:52:38 PM Electronically Signed by [name] M.D. on 07/01/2011 02:19:34 PM

## 2011-07-02 LAB — CBC
MCH: 28.8 pg (ref 26.0–34.0)
MCHC: 34.7 g/dL (ref 30.0–36.0)
MCV: 83 fL (ref 78.0–100.0)
Platelets: 281 10*3/uL (ref 150–400)
RBC: 3.3 MIL/uL — ABNORMAL LOW (ref 3.87–5.11)
RDW: 13.8 % (ref 11.5–15.5)

## 2011-07-02 LAB — COMPREHENSIVE METABOLIC PANEL
Alkaline Phosphatase: 62 U/L (ref 39–117)
BUN: 66 mg/dL — ABNORMAL HIGH (ref 6–23)
Creatinine, Ser: 2.35 mg/dL — ABNORMAL HIGH (ref 0.50–1.10)
GFR calc Af Amer: 25 mL/min — ABNORMAL LOW (ref >60)
Glucose, Bld: 66 mg/dL — ABNORMAL LOW (ref 70–99)
Potassium: 4.6 mEq/L (ref 3.5–5.1)
Total Protein: 6.1 g/dL (ref 6.0–8.3)

## 2011-07-02 LAB — PHOSPHORUS: Phosphorus: 4.5 mg/dL (ref 2.3–4.6)

## 2011-07-02 LAB — GLUCOSE, CAPILLARY: Glucose-Capillary: 195 mg/dL — ABNORMAL HIGH (ref 70–99)

## 2011-07-03 LAB — PROTEIN ELECTROPH W RFLX QUANT IMMUNOGLOBULINS
Albumin ELP: 42.9 % — ABNORMAL LOW (ref 55.8–66.1)
Beta 2: 8.1 % — ABNORMAL HIGH (ref 3.2–6.5)
Total Protein ELP: 6.4 g/dL (ref 6.0–8.3)

## 2011-07-03 LAB — RENAL FUNCTION PANEL
BUN: 58 mg/dL — ABNORMAL HIGH (ref 6–23)
Calcium: 8.5 mg/dL (ref 8.4–10.5)
Creatinine, Ser: 1.94 mg/dL — ABNORMAL HIGH (ref 0.50–1.10)
Glucose, Bld: 222 mg/dL — ABNORMAL HIGH (ref 70–99)
Phosphorus: 3.1 mg/dL (ref 2.3–4.6)

## 2011-07-03 LAB — IRON AND TIBC
Saturation Ratios: 12 % — ABNORMAL LOW (ref 20–55)
UIBC: 225 ug/dL (ref 125–400)

## 2011-07-03 LAB — FERRITIN: Ferritin: 181 ng/mL (ref 10–291)

## 2011-07-03 LAB — URINE CULTURE: Culture  Setup Time: 201209172113

## 2011-07-03 LAB — CBC
HCT: 30.4 % — ABNORMAL LOW (ref 36.0–46.0)
Hemoglobin: 10.1 g/dL — ABNORMAL LOW (ref 12.0–15.0)
MCH: 27.7 pg (ref 26.0–34.0)
MCHC: 33.2 g/dL (ref 30.0–36.0)
MCV: 83.3 fL (ref 78.0–100.0)

## 2011-07-03 LAB — GLUCOSE, CAPILLARY: Glucose-Capillary: 232 mg/dL — ABNORMAL HIGH (ref 70–99)

## 2011-07-03 LAB — IMMUNOFIXATION ADD-ON

## 2011-07-03 NOTE — Discharge Summary (Signed)
[name], Rhonda NO.:  [id number]  MEDICAL RECORD NO.:  [id number]  LOCATION:  2037                         FACILITY:  [facility]  PHYSICIAN:  [name], M.D.  DATE OF BIRTH:  [birth year]  DATE OF ADMISSION:  06/16/2011 DATE OF DISCHARGE:  07/01/2011                        DISCHARGE SUMMARY - REFERRING   PRIMARY [name]:  Rhonda Blanks, [name]  PRIMARY [name]:  Rhonda Bonito, [name]  PRIMARY CARDIOLOGIST:  [facility].  DISCHARGE DIAGNOSES: 1. Infected wound, volar aspect of left foot. 2. Severe peripheral vascular disease/left foot ischemia, status post     left femoro-popliteal bypass on June 26, 2011. 3. History of congestive heart failure, ejection fraction 50% per 2-D     echocardiogram on June 18, 2011. 4. Coronary artery disease, status post CABG in 2009. 5. Hypertension. 6. Dyslipidemia/hypertriglyceridemia. 7. Insulin-requiring type 2 diabetes mellitus. 8. Dehydration/acute renal insufficiency. 9. Acute kidney injury superimposed on above, likely secondary to     intravenous contrast media. 10.Constipation. 11.Acute blood loss anemia of chronic anemia. Required transfusion of     2 units packed red blood cells, on June 29, 2011. 12.Deconditioning.  DISCHARGE MEDICATIONS: 1. Amlodipine 10 mg p.o. daily. 2. Doxycycline 100 mg p.o. b.i.d. for 7 days only. 3. Glucerna vanilla shake nutritional supplement 237 mL p.o. t.i.d. 4. MiraLax 17 g p.o. daily. 5. Tramadol 50 mg p.o. t.i.d. for 7 days only for pain. 6. Coumadin per INR, currently on 5 mg p.o. at 6:00 p.m. daily. 7. Coreg 25 mg p.o. b.i.d. (was on 12.5 mg p.o. b.i.d.) 8. Hydrocodone/APAP (7.5/750) 1 tablet p.o. p.r.n. q.6 h. for pain. 9. Lantus insulin 45 units subcutaneously b.i.d. (was on 40 units     subcutaneously b.i.d.) 10.Apidra 10 units subcutaneously t.i.d. with meals. 11.Atorvastatin 40 mg p.o. daily. 12.Digoxin 125 mcg p.o.  daily. 13.Folic acid 1 mg p.o. daily. 14.Lovaza 4 g p.o. daily. 15.Niaspan 500 mg p.o. daily. 16.Omeprazole 20 mg p.o. daily.  Note the following medications have been discontinued. 1. Keflex. 2. Plavix. 3. Furosemide. 4. Lisinopril. 5. Potassium chloride.  PROCEDURES: 1. Chest x-ray on June 17, 2011.  This showed mild congestive     changes in the heart and lungs with suggestion of mild interstitial     edema.  There were scattered calcified granulomas on the right. 2. X-ray of left foot on June 17, 2011.  This showed no definite     areas of cortical erosions to suggest osteomyelitis, mild first     metatarsophalangeal joint osteoarthritis. 3. A 2-D echocardiogram on June 18, 2011.  This showed hypokinesis     of the basal inferior segment of the left ventricle as well as     A/mid inferolateral segment, estimated ejection fraction was 50%.     Doppler parameters were consistent with high ventricular filling     pressure.  There was aortic valve sclerosis without stenosis.  No     significant regurgitation. 4. MRI of left foot on June 21, 2011.  This was an unremarkable     MRI examination of the left foot. 5. Left lower extremity angiography on June 26, 2011, left leg  femoral-popliteal bypass graft widely patent, two-vessel runoff to     the left lower leg.  CONSULTATIONS: 1. Rhonda Corporal, [name], cardiologist. 2. Rhonda Skye. Hart Rochester, [name], vein and vascular surgeon.  ADMISSION HISTORY:  As in H and P notes of June 16, 2011, dictated by Dr. Midge Minium. However, in brief, this is a 70 year old female, with known history of coronary artery disease, status post CABG in 2009; hypertension; insulin-dependent type 2 diabetes mellitus; dyslipidemia; status post previous tubal ligation; known ejection fraction in 2009 with EF of 40% to 45%; presenting with left foot pain.  It appears that she had noticed some thickening on the left foot plantar  aspect, to which she had applied some plaster,  which she removed subsequently, after which skin was peeling off, and when she followed up with [name] 3 days prior to admission, the area was tender and had a small satellite lesion.  [name], [name], prescribed Keflex. Despite this, she felt progressively weaker after 3 days and she had been running low blood sugars.  She presented to the emergency department.  On initial evaluation, she was found to have CBG of 97.  Chest x-ray showed mild interstitial edema.  The patient was administered Lasix and then admitted for further evaluation, investigation, and management.  CLINICAL COURSE: 1. Infected left foot diabetic ulcer.  The patient presented as     described above.  She was commenced on intravenous antibiotic     therapy.  However, it was felt that she may have an element of     left lower extremity ischemia.  Vascular surgical consultation was     called, which was kindly provided by Dr. Hart [name].  The patient     subsequently underwent appropriate evaluation. For details, see     below.  Over the course of Rhonda hospitalization, left foot wound has     improved significantly and was indeed stable by June 30, 2011.     As of that date, she was on day #14 of antibiotic therapy.  She has     since been rationalized to oral doxycycline therapy, for further 7     days, to complete a total of 21 days of antibiotic therapy.  2. Severe peripheral vascular disease/left foot ischemia. On suspicion of     peripheral vascular disease, vascular surgical consultation was     called, which was kindly provided by Dr. Hart [name].  He arranged for     the patient to have an angiogram which was carried out on June 20, 2011, and this showed left common iliac artery stenosis that is     less than 50% with less than 10% gradient across it. Bilateral SFAs     have high-grade stenosis within them and there is also  high-grade     left popliteal artery stenosis at the level of the knee.  The     patient subsequently on June 26, 2011, underwent left common     femoro-popliteal below-the-knee bypass, using a nonreversed     translocated saphenous vein graft from the left leg with     intraoperative arteriogram.  For details of arteriogram, refer to     procedure list above.  The patient did well following this     procedure and certainly as of June 28, 2011, pain had greatly     ameliorated.  Over the subsequent next few days, the patient was no     longer  in acute pain and as a matter of fact on June 30, 2011,     was ambulating.  3. Congestive heart failure.  The patient has a known history of     coronary artery disease and is status post CABG in 2009.  At the     time of presentation to the emergency department, there was some     suggestion of wheezing and shortness of breath.  Chest x-ray     demonstrated interstitial edema.  The patient was managed with     intravenous Lasix.  Cardiology consultation was provided by Dr.     Morton [name], who concluded that the patient did indeed have mild     congestive heart failure and agreed with utilization of Lasix, but     recommended a 2-D echocardiogram.  This was subsequently done and     showed an ejection fraction of 50%.  Over the course of Rhonda     hospitalization, the patient's congestive heart failure     compensated; however, Lasix was subsequently discontinued, secondary     to renal insufficiency.  4. Hypertension.  This was addressed with a combination of Coreg and     Norvasc.  5. History of coronary artery disease.  The patient had no evidence of     acute coronary syndrome during the course of this hospitalization.  6. Dyslipidemia/hypertriglyceridemia.  The patient continued on     preadmission statin and niacin treatment.  7. Dehydration, acute renal insufficiency/acute kidney injury.  The     patient presented with  a BUN of 26, creatinine 1.6 up to maximum of     1.70 on June 19, 2011.  This was felt secondary to     over-diuresis.  Diuretics were discontinued.  It was also likely     that intravenous contrast administered dring arteriogram, may have been     contributory. Be that as it may, the patient was managed with     gentle intravenous fluid administration.  Diuretics were held,     although the patient's creatinine went up again to 1.88 on     June 26, 2011.  She has since experienced gradual improvement     in creatinine level and up to a high of 1.99 on June 29, 2011.     As of June 30, 2011, creatinine was 1.96.  further improvement     is anticipated.  It is recommended that close followup of the     patient's renal indices, be instituted by the patient's primary [name].  We     shall defer timing of reinstatement of Rhonda diuretic treatment to     Rhonda primary [name], on followup.  8. Constipation.  This was readily addressed with laxatives, to good     effect and may have been secondary to opioid analgesics.  9. Acute blood loss anemia.  The patient's hemoglobin at the time of     presentation was 13.3 and remained relatively stable at between     10.6 and 11.0, until the patient had Rhonda surgery on June 26, 2011.  Following surgery, hemoglobin dropped to 9.1 and the patient     experienced a steady diminution in hemoglobin, down to 7.6 on     June 29, 2011, necessitating transfusion of 2 units PRBC, with     a satisfactory bump in hemoglobin to 10.1 on June 30, 2011.     She had no evidence of overt  bleed.  It is likely that this was     acute blood loss anemia, secondary to surgery.  10.Deconditioning.  The patient is obviously deconditioned secondary     to Rhonda recent acute problems.  She was evaluated by PT/OT.  As a     matter of fact, she has been recommended short-term SNF for     rehabilitation.  DISPOSITION:  Provided no further acute problems  arise in the interim, the patient will be discharged on July 01, 2011, as she is now considered clinically stable.  ACTIVITY:  As tolerated.  Recommended to increase activity slowly, otherwise, per PT/OT/rehab.  DIET:  Heart healthy/carbohydrate modified.  FOLLOWUP INSTRUCTIONS:  The patient will follow up with Rhonda primary physician, Dr. Burnell [name] within 1-2 weeks of discharge and also follow up with [name], Dr. Brooke [name] per prior scheduled appointment, as well as with Rhonda primary cardiologist at [facility], per prior scheduled appointment.     [name], M.D.     CO/MEDQ  D:  06/30/2011  T:  06/30/2011  Job:  [id number]  cc:   Rhonda Blanks, [name] [name], M.D. [facility]  Electronically Signed by [name] M.D. on 07/03/2011 10:15:44 PM

## 2011-07-05 LAB — IGG, IGA, IGM: IgG (Immunoglobin G), Serum: 767 mg/dL (ref 690–1700)

## 2011-07-08 NOTE — Consult Note (Signed)
[name], ABELN                  ACCOUNT NO.:  [id number]  MEDICAL RECORD NO.:  [id number]  LOCATION:  2037                         FACILITY:  [facility]  PHYSICIAN:  Fayrene Fearing L. Aarush Stukey, M.D.DATE OF BIRTH:  [birth year]  DATE OF CONSULTATION: DATE OF DISCHARGE:                                CONSULTATION   CONSULTING PHYSICIAN:  [facility].  REASON FOR CONSULTATION: 1. Chronic kidney disease, stage III. 2. Acute kidney injury.  HISTORY OF PRESENT ILLNESS:  This is a 70 year old female with history of diabetes over 15 years history, over 15 years of hypertension, hyperlipidemia, also history of coronary artery disease status post CABG in 2009.  She was admitted with decreased blood sugars, foot ulcer __________  She had been on oral antibiotics.  While she was here, she had an angiogram on June 20, 2011, and June 26, 2011, and left fem-pop on June 26, 2011.  She also had a DVT while she was here.  She had recurrent low blood pressure while she was here and also had a toxic vancomycin level.  Her creatinine on admission was 1.6, has varied anywhere from 1.2 to 2.29, which is today's creatinine.  It was 1.99 on June 30, 2011, and so it was seemed to be going up today.  No history of stones or UTIs or family history of renal disease.  No history of nonsteroidals recently.  No history of inherited eye defects, hearing defects, or musculoskeletal defects.  She does not have known diabetic retinopathy.  She was on an ACE at home and was on it for the first few days here, but that was discontinued.  CURRENT MEDICATIONS: 1. Norvasc 10 mg a day. 2. Bisacodyl p.r.n. 3. Carvedilol 25 mg b.i.d. 4. Doxycycline 100 mg once a day. 5. Folic acid 1 mg a day. 6. Hydralazine 20 mg q.4 h. 7. NovoLog sliding scale before meals and nighty. 8. Niacin 500 mg a day. 9. Lovaza 4 g a day. 10.Pantoprazole 40 mg a day. 11.P.r.n. MiraLax. 12.Crestor 200 mg a day. 13.Tramadol  p.r.n. 14.Warfarin. 15.Tylenol p.r.n. 16.Calcium as needed for indigestion. 17.Docusate. 18.Guaifenesin.  PAST MEDICAL HISTORY:  She has an allergy to ASPIRIN.  She has had CABG. She had a tonsillectomy done.  She had tubal ligation done.  At home, she is on lisinopril 40 mg a day.  SOCIAL HISTORY:  She is a nondrinker.  She smoked, about 46 pack year history.  Nondrinker.  No illegal drugs.  FAMILY HISTORY:  Father died in his 51s of some kind of skin cancer. Mother died at age 47 from kidney cancer.  She had a sister who died from cirrhosis.  She has a sister and 2 brothers who died of unknown cause.  REVIEW OF SYSTEMS:  HEENT:  She has had recently some decrease in vision.  Apparently, she told she did not have diabetic disease.  No hearing defects or headaches.  PULMONARY:  She has had no cough, sputum production, or asthma.  She does have some breathing difficulties at home.  CARDIOVASCULAR:  She notes ankle edema at home.  She has some PND, sleeps on 2 pillows, nocturia x2 to 3.  No dysuria,  hematuria.  GI: No nausea, vomiting, or diarrhea.  No ingestion or heartburn.  She still has a bit constipation here, but none at home.  SKIN:  Unremarkable. She has got an ulcer on her foot, which is developed as listed above. MUSCULOSKELETAL:  Arthritis in her hands and her shoulders, but she does not really take anything for that.  PHYSICAL EXAMINATION:  GENERAL:  No acute distress. VITAL SIGNS:  Blood pressure supine 108/48 going to 88/40 from lying to standing. HEENT:  She is edentulous.  Fundi not well visualized.  We cannot see diabetic retinopathy. NECK:  Without masses or thyromegaly. CARDIOVASCULAR:  Regular rhythm.  S4.  Grade 1-2/6 holosystolic murmur heard best in the left lower sternal border at the apex.  PMI 11 substernal at the fifth intercostal space.  She has 2+ edema.  Decreased dorsalis and pedal pulses.  Bilateral femoral bruits lower on the left than on the  right. LUNGS:  Normal expansion with percussion.  No rales, rhonchi, or wheezes. ABDOMEN:  Active bowel sounds, soft, nontender.  Liver sounded 4 to 5 cm.  Nontender and somewhat obese. SKIN:  Atrophic changes in lower extremities. MUSCULOSKELETAL:  Some hypertrophic changes in the PIPs and DIPs.  LABORATORY DATA:  Hemoglobin 9.8, white count 10,600, platelets 282,000. BMET today; sodium 129, potassium 4.5, chloride 96, bicarbonate 20, creatinine 2.29, BUN 64, glucose 153.  ASSESSMENT: 1. Chronic kidney disease, III with mild acute kidney injury.  Blood     pressure is low now, but I suspect the injury is multifactorial,     i.e. dye being at the top of the list.  She has also had toxic     vancomycin level.  We need to rule out any acute interstitial     disease and meds.  Suspect she has underlying diabetic nephropathy,     stage III disease.  Suspect blood pressures and hypoperfusion as     the biggest issues now and blood pressure is low.  We will give her     some volume. 2. Diabetes mellitus, good control. 3. Hypertension, decreased blood pressure, we will decrease meds and     check orthostatics. 4. Anemia.  Check her iron studies. 5. Peripheral vascular disease. 6. Coronary artery disease. 7. Hyperlipidemia.  PLAN: 1. Discontinue hydralazine. 2. Discontinue Coreg and Norvasc. 3. Check an iron. 4. Ultrasound. 5. SPEP and UPEP. 6. Urinalysis, serum sodium and creatinine.          ______________________________ Llana Aliment. Rollande Thursby, M.D.     JLD/MEDQ  D:  07/01/2011  T:  07/02/2011  Job:  [id number]  Electronically Signed by Beryle Lathe M.D. on 07/08/2011 11:22:36 AM

## 2011-07-11 LAB — CREATININE, SERUM
Creatinine, Ser: 1.11
Creatinine, Ser: 1.44 — ABNORMAL HIGH
GFR calc Af Amer: 44 — ABNORMAL LOW
GFR calc Af Amer: 60 — ABNORMAL LOW
GFR calc non Af Amer: 36 — ABNORMAL LOW
GFR calc non Af Amer: 49 — ABNORMAL LOW

## 2011-07-11 LAB — POCT I-STAT 3, ART BLOOD GAS (G3+)
Acid-base deficit: 2
Acid-base deficit: 5 — ABNORMAL HIGH
Bicarbonate: 23
O2 Saturation: 96
Operator id: 305741
Operator id: 3390
Operator id: 3390
Patient temperature: 32.5
Patient temperature: 36.2
pCO2 arterial: 38.9
pCO2 arterial: 43.5
pH, Arterial: 7.362
pO2, Arterial: 144 — ABNORMAL HIGH
pO2, Arterial: 94

## 2011-07-11 LAB — BASIC METABOLIC PANEL
BUN: 12
BUN: 12
BUN: 14
BUN: 16
CO2: 22
CO2: 22
Calcium: 8 — ABNORMAL LOW
Calcium: 9.2
Calcium: 9.4
Chloride: 101
Chloride: 104
Chloride: 111
Creatinine, Ser: 0.91
Creatinine, Ser: 0.97
Creatinine, Ser: 1.2
GFR calc Af Amer: 54 — ABNORMAL LOW
GFR calc Af Amer: >60
GFR calc non Af Amer: 45 — ABNORMAL LOW
GFR calc non Af Amer: 55 — ABNORMAL LOW
GFR calc non Af Amer: 57 — ABNORMAL LOW
GFR calc non Af Amer: >60
Glucose, Bld: 143 — ABNORMAL HIGH
Glucose, Bld: 298 — ABNORMAL HIGH
Glucose, Bld: 320 — ABNORMAL HIGH
Glucose, Bld: 350 — ABNORMAL HIGH
Potassium: 3.9
Potassium: 4.2
Potassium: 4.2
Potassium: 4.8
Sodium: 134 — ABNORMAL LOW
Sodium: 136
Sodium: 141

## 2011-07-11 LAB — TROPONIN I
Troponin I: 0.02
Troponin I: 0.02

## 2011-07-11 LAB — HEMOGLOBIN AND HEMATOCRIT, BLOOD: HCT: 22 — ABNORMAL LOW

## 2011-07-11 LAB — CBC
HCT: 22.7 — ABNORMAL LOW
HCT: 24.9 — ABNORMAL LOW
HCT: 25.4 — ABNORMAL LOW
HCT: 26.7 — ABNORMAL LOW
HCT: 35.2 — ABNORMAL LOW
HCT: 35.3 — ABNORMAL LOW
HCT: 38.2
Hemoglobin: 11.6 — ABNORMAL LOW
Hemoglobin: 11.9 — ABNORMAL LOW
Hemoglobin: 12.2
Hemoglobin: 7.7 — CL
Hemoglobin: 8.4 — ABNORMAL LOW
Hemoglobin: 8.5 — ABNORMAL LOW
Hemoglobin: 8.9 — ABNORMAL LOW
MCHC: 33.1
MCHC: 33.5
MCHC: 33.5
MCHC: 33.5
MCHC: 34
MCHC: 34.5
MCV: 81.7
MCV: 81.8
MCV: 82.1
MCV: 82.3
MCV: 82.5
MCV: 82.8
MCV: 84.6
MCV: 84.9
Platelets: 185
Platelets: 186
Platelets: 204
Platelets: 209
Platelets: 310
Platelets: 327
Platelets: 333
RBC: 2.75 — ABNORMAL LOW
RBC: 2.95 — ABNORMAL LOW
RBC: 2.99 — ABNORMAL LOW
RBC: 3.26 — ABNORMAL LOW
RBC: 4.27
RBC: 4.39
RBC: 4.65
RDW: 13.9
RDW: 14
RDW: 14
RDW: 14.1
RDW: 14.1
RDW: 14.5
RDW: 15.5
WBC: 10.2
WBC: 11.2 — ABNORMAL HIGH
WBC: 14.9 — ABNORMAL HIGH
WBC: 16.4 — ABNORMAL HIGH
WBC: 16.6 — ABNORMAL HIGH
WBC: 17.2 — ABNORMAL HIGH

## 2011-07-11 LAB — COMPREHENSIVE METABOLIC PANEL
ALT: 16
CO2: 24
CO2: 26
Calcium: 9.1
Calcium: 9.5
Chloride: 102
Creatinine, Ser: 1.09
Creatinine, Ser: 1.18
GFR calc Af Amer: >60
GFR calc non Af Amer: 46 — ABNORMAL LOW
GFR calc non Af Amer: 50 — ABNORMAL LOW
Glucose, Bld: 249 — ABNORMAL HIGH
Glucose, Bld: 274 — ABNORMAL HIGH
Sodium: 139
Total Bilirubin: 0.6
Total Protein: 6.2

## 2011-07-11 LAB — TYPE AND SCREEN
ABO/RH(D): A POS
Antibody Screen: NEGATIVE

## 2011-07-11 LAB — PREPARE FRESH FROZEN PLASMA

## 2011-07-11 LAB — POCT I-STAT 4, (NA,K, GLUC, HGB,HCT)
HCT: 22 — ABNORMAL LOW
Hemoglobin: 10.2 — ABNORMAL LOW
Hemoglobin: 11.2 — ABNORMAL LOW
Hemoglobin: 7.5 — CL
Potassium: 4.1
Potassium: 4.6
Sodium: 132 — ABNORMAL LOW
Sodium: 133 — ABNORMAL LOW
Sodium: 135
Sodium: 136

## 2011-07-11 LAB — POCT I-STAT, CHEM 8
BUN: 20
Calcium, Ion: 1.16
Glucose, Bld: 116 — ABNORMAL HIGH
HCT: 23 — ABNORMAL LOW
HCT: 25 — ABNORMAL LOW
HCT: 42
Hemoglobin: 13.6
Hemoglobin: 14.3
Hemoglobin: 7.8 — CL
Hemoglobin: 8.5 — ABNORMAL LOW
Potassium: 4
Potassium: 4.1
Sodium: 136
Sodium: 138
Sodium: 139
Sodium: 141
TCO2: 20
TCO2: 22
TCO2: 26

## 2011-07-11 LAB — CK TOTAL AND CKMB (NOT AT ARMC)
Relative Index: 1.7
Total CK: 204 — ABNORMAL HIGH
Total CK: 273 — ABNORMAL HIGH

## 2011-07-11 LAB — BLOOD GAS, ARTERIAL
Drawn by: 305751
FIO2: 0.21
Patient temperature: 98.6
TCO2: 24.5
pCO2 arterial: 38
pH, Arterial: 7.405 — ABNORMAL HIGH

## 2011-07-11 LAB — POCT CARDIAC MARKERS
CKMB, poc: 2.5
Myoglobin, poc: 134
Myoglobin, poc: 166
Operator id: 284251
Troponin i, poc: 0.06 — ABNORMAL HIGH

## 2011-07-11 LAB — LIPID PANEL
Total CHOL/HDL Ratio: 4.5
Triglycerides: 372 — ABNORMAL HIGH
VLDL: 65 — ABNORMAL HIGH

## 2011-07-11 LAB — POCT I-STAT 3, VENOUS BLOOD GAS (G3P V)
Acid-base deficit: 3 — ABNORMAL HIGH
O2 Saturation: 69
Patient temperature: 32.5

## 2011-07-11 LAB — URINALYSIS, DIPSTICK ONLY
Bilirubin Urine: NEGATIVE
Ketones, ur: NEGATIVE
Leukocytes, UA: NEGATIVE
Nitrite: NEGATIVE
Protein, ur: NEGATIVE
Urobilinogen, UA: 0.2
pH: 5

## 2011-07-11 LAB — HEPARIN LEVEL (UNFRACTIONATED): Heparin Unfractionated: 0.51

## 2011-07-11 LAB — PROTIME-INR
INR: 1.1
INR: 1.3
Prothrombin Time: 14.6
Prothrombin Time: 17 — ABNORMAL HIGH
Prothrombin Time: 25.3 — ABNORMAL HIGH
Prothrombin Time: 26.6 — ABNORMAL HIGH

## 2011-07-11 LAB — MAGNESIUM
Magnesium: 2.5
Magnesium: 2.6 — ABNORMAL HIGH
Magnesium: 2.8 — ABNORMAL HIGH

## 2011-07-11 LAB — HEMOGLOBIN A1C
Hgb A1c MFr Bld: 10.6 — ABNORMAL HIGH
Mean Plasma Glucose: 300

## 2011-07-11 LAB — POCT I-STAT GLUCOSE
Glucose, Bld: 181 — ABNORMAL HIGH
Operator id: 125961
Operator id: 3390
Operator id: 3390

## 2011-07-11 LAB — APTT: aPTT: 48 — ABNORMAL HIGH

## 2011-07-11 LAB — PLATELET COUNT: Platelets: 217

## 2011-07-11 LAB — BLEEDING TIME: Bleeding Time: 3.5

## 2011-07-12 LAB — BASIC METABOLIC PANEL
BUN: 13
BUN: 14
BUN: 17
BUN: 21
BUN: 22
CO2: 24
CO2: 24
CO2: 25
CO2: 26
CO2: 27
Calcium: 8.2 — ABNORMAL LOW
Calcium: 8.5
Calcium: 9
Calcium: 9.1
Calcium: 9.2
Chloride: 101
Chloride: 102
Chloride: 105
Chloride: 105
Chloride: 108
Creatinine, Ser: 0.98
Creatinine, Ser: 1.02
Creatinine, Ser: 1.12
Creatinine, Ser: 1.16
Creatinine, Ser: 1.35 — ABNORMAL HIGH
GFR calc Af Amer: 47 — ABNORMAL LOW
GFR calc Af Amer: 57 — ABNORMAL LOW
GFR calc Af Amer: 59 — ABNORMAL LOW
GFR calc Af Amer: >60
GFR calc Af Amer: >60
GFR calc non Af Amer: 39 — ABNORMAL LOW
GFR calc non Af Amer: 47 — ABNORMAL LOW
GFR calc non Af Amer: 49 — ABNORMAL LOW
GFR calc non Af Amer: 54 — ABNORMAL LOW
GFR calc non Af Amer: 57 — ABNORMAL LOW
Glucose, Bld: 127 — ABNORMAL HIGH
Glucose, Bld: 199 — ABNORMAL HIGH
Glucose, Bld: 251 — ABNORMAL HIGH
Glucose, Bld: 69 — ABNORMAL LOW
Glucose, Bld: 97
Potassium: 3.8
Potassium: 3.9
Potassium: 4.1
Potassium: 4.3
Potassium: 4.6
Sodium: 136
Sodium: 136
Sodium: 138
Sodium: 139
Sodium: 139

## 2011-07-12 LAB — CBC
HCT: 23.2 — ABNORMAL LOW
HCT: 25.1 — ABNORMAL LOW
HCT: 26.3 — ABNORMAL LOW
HCT: 26.6 — ABNORMAL LOW
HCT: 26.6 — ABNORMAL LOW
Hemoglobin: 7.9 — CL
Hemoglobin: 8.4 — ABNORMAL LOW
Hemoglobin: 8.6 — ABNORMAL LOW
Hemoglobin: 8.8 — ABNORMAL LOW
Hemoglobin: 9.1 — ABNORMAL LOW
MCHC: 32.9
MCHC: 33.2
MCHC: 33.3
MCHC: 34.1
MCHC: 34.3
MCHC: 31.8
MCV: 84.1
MCV: 84.1
MCV: 84.1
MCV: 84.3
MCV: 84.7
MCV: 80.4
Platelets: 177
Platelets: 216
Platelets: 364
Platelets: 365
Platelets: ADEQUATE
Platelets: UNDETERMINED
RBC: 2.76 — ABNORMAL LOW
RBC: 2.96 — ABNORMAL LOW
RBC: 3.13 — ABNORMAL LOW
RBC: 3.15 — ABNORMAL LOW
RBC: 3.16 — ABNORMAL LOW
RDW: 15
RDW: 15.1
RDW: 15.1
RDW: 15.1
RDW: 15.2
WBC: 12.4 — ABNORMAL HIGH
WBC: 12.7 — ABNORMAL HIGH
WBC: 15 — ABNORMAL HIGH
WBC: 15.6 — ABNORMAL HIGH
WBC: 15.7 — ABNORMAL HIGH

## 2011-07-12 LAB — URINE CULTURE
Colony Count: 9000
Special Requests: NEGATIVE

## 2011-07-12 LAB — URINALYSIS, ROUTINE W REFLEX MICROSCOPIC
Bilirubin Urine: NEGATIVE
Glucose, UA: 100 — AB
Hgb urine dipstick: NEGATIVE
Ketones, ur: NEGATIVE
Nitrite: NEGATIVE
Protein, ur: NEGATIVE
Specific Gravity, Urine: 1.01
Urobilinogen, UA: 0.2
pH: 6

## 2011-07-12 LAB — DIFFERENTIAL
Eosinophils Absolute: 0.6
Monocytes Absolute: 1
Neutrophils Relative %: 65

## 2011-07-12 LAB — POCT I-STAT, CHEM 8
HCT: 35 — ABNORMAL LOW
Hemoglobin: 11.9 — ABNORMAL LOW
Potassium: 4.2
Sodium: 139

## 2011-07-15 ENCOUNTER — Encounter: Payer: Self-pay | Admitting: Vascular Surgery

## 2011-07-16 ENCOUNTER — Ambulatory Visit (INDEPENDENT_AMBULATORY_CARE_PROVIDER_SITE_OTHER): Payer: Medicare Other | Admitting: Vascular Surgery

## 2011-07-16 ENCOUNTER — Encounter: Payer: Self-pay | Admitting: Vascular Surgery

## 2011-07-16 ENCOUNTER — Ambulatory Visit (INDEPENDENT_AMBULATORY_CARE_PROVIDER_SITE_OTHER): Payer: Medicare Other | Admitting: *Deleted

## 2011-07-16 VITALS — BP 179/66 | HR 95 | Resp 20 | Ht 63.0 in | Wt 157.0 lb

## 2011-07-16 DIAGNOSIS — I739 Peripheral vascular disease, unspecified: Secondary | ICD-10-CM

## 2011-07-16 DIAGNOSIS — Z48812 Encounter for surgical aftercare following surgery on the circulatory system: Secondary | ICD-10-CM

## 2011-07-16 LAB — GLUCOSE, CAPILLARY
Glucose-Capillary: 144 — ABNORMAL HIGH
Glucose-Capillary: 153 — ABNORMAL HIGH
Glucose-Capillary: 180 — ABNORMAL HIGH
Glucose-Capillary: 254 — ABNORMAL HIGH
Glucose-Capillary: 269 — ABNORMAL HIGH

## 2011-07-16 NOTE — Progress Notes (Signed)
Subjective:     Patient ID: [name], female   DOB: 16-Jun-1941, 70 y.o.   MRN: [id number]  HPI this 70 year old female is post left femoral popliteal bypass graft using saphenous vein by me on 06/26/2011. She has a nonhealing ulcer on the plantar aspect of the left foot. This has never been infected and does seem to be slowly improving but is still present. He has had significant edema in the left leg since her surgery but has not been elevating the leg appropriately. He denies any chills or fever. States the foot feels much better at the present time.  Review of Systems     Objective:   Physical Exam blood pressure 179/66 heart rate 95 respirations 20 Left lower extremity exam reveals the left inguinal incision to have some superficial separation but only about 3 mm in width. There is no evidence of superficial infection. The other in the vein harvesting incisions have healed nicely. He does have 1+ edema throughout the thigh and calf into the foot.   Today I ordered lower extremity arterial Doppler studies. ABI in the left foot is 0.84 with biphasic flow and widely patent vein graft.  Assessment:     Nicely functioning left femoral popliteal vein graft. Superficial mild separation of left inguinal wound.    Plan:    #1 refer to wound center at [facility] #2 local wound care left groin #3 elevate leg 3-4 inches during the day and at night #4 return to see me in 4 weeks

## 2011-07-24 ENCOUNTER — Encounter (HOSPITAL_BASED_OUTPATIENT_CLINIC_OR_DEPARTMENT_OTHER): Payer: Medicare Other | Attending: Plastic Surgery

## 2011-07-24 ENCOUNTER — Other Ambulatory Visit: Payer: Self-pay | Admitting: Plastic Surgery

## 2011-07-24 DIAGNOSIS — T8189XA Other complications of procedures, not elsewhere classified, initial encounter: Secondary | ICD-10-CM | POA: Insufficient documentation

## 2011-07-24 DIAGNOSIS — E1169 Type 2 diabetes mellitus with other specified complication: Secondary | ICD-10-CM | POA: Insufficient documentation

## 2011-07-24 DIAGNOSIS — L97509 Non-pressure chronic ulcer of other part of unspecified foot with unspecified severity: Secondary | ICD-10-CM | POA: Insufficient documentation

## 2011-07-24 DIAGNOSIS — L98499 Non-pressure chronic ulcer of skin of other sites with unspecified severity: Secondary | ICD-10-CM | POA: Insufficient documentation

## 2011-07-24 DIAGNOSIS — Y838 Other surgical procedures as the cause of abnormal reaction of the patient, or of later complication, without mention of misadventure at the time of the procedure: Secondary | ICD-10-CM | POA: Insufficient documentation

## 2011-07-24 NOTE — Discharge Summary (Signed)
[name], [name]                  ACCOUNT NO.:  [id number]  MEDICAL RECORD NO.:  [id number]  LOCATION:                                 FACILITY:  PHYSICIAN:  Baltazar Najjar, MD     DATE OF BIRTH:  04/04/1941  DATE OF ADMISSION: DATE OF DISCHARGE:                              DISCHARGE SUMMARY   ADDENDUM  Please refer to the dictated discharge summary on June 30, 2011.  CONSULTATIONS DURING THIS HOSPITALIZATION:  The patient was seen by Dr. Beryle Lathe from [facility] for acute kidney injury.  RADIOLOGY/IMAGING STUDIES:  The patient had renal sonogram done on July 02, 2011 that showed no hydronephrosis.  Increased echogenicity of the kidney suggesting medical renal disease.  DISCHARGE MEDICATIONS: 1. Amlodipine 5 mg p.o. at bedtime. 2. Doxycycline 100 mg p.o. daily for four more days. 3. Lantus insulin Solo pen 35 units subcutaneously twice daily. 4. Nutritional supplements, Glucerna vanilla shake 237 mL p.o. three     times a day. 5. MiraLax 17 g p.o. daily. 6. Tramadol 50 mg p.o. three times a day for pain. 7. Coumadin 3 mg p.o. daily.  We will give her 14 days prescription,     however, dosing adjustment will be deferred to PCP based on her INR     with a goal of 2-3. 8. Ferrous sulfate 325 mg p.o. b.i.d. 9. Coreg 12.5 mg p.o. twice daily. 10.Hydrocodone/APAP 7.5/750 mg one tablet p.o. q.6 h. p.r.n. for pain.     We will give a 15-day supply. 11.Apidra 10 units subcutaneously three times daily with meals. 12.Atorvastatin 40 mg p.o. daily. 13.Digoxin 0.125 mg one tablet p.o. daily. 14.Folic acid 1 mg one tablet p.o. daily. 15.Lovaza 4 capsules p.o. daily 1 gram each. 16.Niaspan 500 mg one tablet p.o. daily. 17.Omeprazole 20 mg p.o. daily.  HOSPITAL COURSE PER PROBLEM LIST:  Please refer to the dictated discharge summary dated June 30, 2011 for further details. 1. Infected left foot diabetic ulcer.  The patient was continued on   doxycycline and she will be discharged on four more days of     doxycycline therapy to complete that whole course of 21 days of     antibiotics. 2. Left lower extremity, peroneal vein DVT.  The patient is currently     on Coumadin.  Her most recent INR was 2.05 from today.  She will be     discharged on 3 mg p.o. daily.  INR to be rechecked tomorrow and     further adjustment of her Coumadin will be deferred to the rounding     physician at the nursing facility or PCP whoever will follow her     INR that will be discussed with the social worker prior to     discharge to ensure that INR will be followed and Coumadin dose     will be adjusted. 3. Severe peripheral vascular disease/left foot ischemia.  The patient     was followed by Vascular Surgery and she underwent left common     femoral popliteal below-knee bypass.  She was seen by Vascular     Surgery team today who stated that  they are okay from the vascular     standpoint for discharge today to SNF and to follow with them in     the office in 2 weeks after discharge. 4. Congestive heart failure, currently compensated.  Lasix is still on     hold.  Most recent 2-D echo showed 50% EF.  Because of renal     insufficiency, Lasix was held and reinstitution of Lasix will be     deferred to the assessment of the rounding physician at skilled     nursing facility and PCP. 5. Diabetes mellitus, uncontrolled.  The patient will be discharged on     Lantus and Apidra.  She will need monitoring for her CBGs q.a.c.     and at bedtime and further adjustment of her insulin therapy. 6. Acute kidney injury/CKD stage III.  The patient was seen by     [facility] and her most recent creatinine from     today was 1.94.  Her peak creatinine was 2.35 and is currently     coming down to 1.94.  She was seen today by Dr. Caryn Section and recommended     for her to follow with her PCP and if there is any further     concerns, to be referred to them on  an outpatient basis. 7. Iron deficiency anemia.  The patient was given one dose of IV iron     510 mg today by Dr. Caryn Section.  Her TSAT from today was 12%, total iron     332, iron ferritin 181.  I will discharge her on p.o. iron     supplementation.  Further workup of her anemia can be done as an     outpatient.  However, her current acute drop in her hemoglobin is     probably secondary to acute blood loss anemia after surgery, her     hemoglobin from today is 10, the lowest was 7.6, so is currently     improving as I mentioned she will need to follow with her PCP for     any further workup and management of her anemia. 8. Deconditioning.  The patient is recommended to go to skilled     nursing facility and she is being discharged today to skilled     nursing facility for rehab. 9. Please refer to the dictated H and P on September 16 for further     details on her hospitalization.  FOLLOWUP: 1. The patient is to follow with her PCP, Dr. Burnell Blanks within 1-2     weeks of discharge and with her endocrinologist Dr. Juleen China as     scheduled previously and with her cardiologist with [facility] as per her schedule appointment. 2. We will arrange for repeat basic metabolic panel and INR tomorrow     and that to be followed either by rounding physician at skilled     nursing facility or by her PCP, that will be arranged by social     worker prior to discharge today. 3. The patient was seen and examined by me today and she is medically     stable for discharge to skilled nursing facility.  VITAL SIGNS ON DISCHARGE:  Blood pressure 129/71, heart rate of 64, temperature 98.1, O2 sat 96% on room air.          ______________________________ Baltazar Najjar, MD     SA/MEDQ  D:  07/03/2011  T:  07/03/2011  Job:  [id number]  cc:   Burnell Blanks, MD Brooke Bonito, M.D. [facility]  Electronically Signed by Hannah Beat MD on 07/24/2011  09:06:18 PM

## 2011-07-24 NOTE — Progress Notes (Signed)
[facility]  NAME:  [name], [name]                  ACCOUNT NO.:  [id number]  MEDICAL RECORD NO.:  [id number]      DATE OF BIRTH:  1941/04/13  PHYSICIAN:  Wayland Denis, DO            VISIT DATE:                                  OFFICE VISIT   [name] is a 70 year old female who is here for evaluation of a left groin wound.  She underwent left common femoral-to-popliteal bypass on June 26, 2011, one month ago.  She has been in the nursing home. She is not exactly sure of what they were doing for the wound.  The lower leg incisions are asking to be healing.  The wound is about 4 cm long, the exact measurements are noted in the chart.  There is some fibrous tissue, redness around the wound with swelling.  She does have some swelling on the lower areas as well.  The plantar ulcer for which this procedure was performed in order to help heal does look very good and has epithelialized.  PAST MEDICAL HISTORY: 1. Diabetes. 2. Hyperlipidemia. 3. Hypertension. 4. Coronary artery disease status post CABG.  PAST SURGICAL HISTORY:  CABG, tubal ligation, and a left lower extremity bypass.  SOCIAL HISTORY:  Denies smoking, drinking or illegal drugs.  ALLERGIES:  ASPIRIN.  MEDICATIONS:  Digoxin, Lipitor, Lasix, Prilosec, lisinopril, Lopressor, Lovaza, potassium, folic acid, Plavix, Lantus, tramadol, Apidra, metformin, Niaspan, loratadine, carvedilol, Vicodin, Tylenol, Coumadin, and Lyrica.  FAMILY HISTORY:  Father deceased from a skin cancer at age 10.  Mother deceased from kidney cancer at 72.  REVIEW OF SYSTEMS:  She denies any significant change in her weight, bleeding in urine or stool, difficulty breathing, chest pain.  PHYSICAL EXAMINATION:  GENERAL:  She is alert, oriented, cooperative, not in any acute distress.  She is pleasant.  She seems to be a good historian.  She left the nursing home yesterday and is now back at home. HEENT:  Her pupils are  equal.  Extraocular muscles are intact.  No cervical lymphadenopathy. CHEST:  Breathing is unlabored. HEART:  Heart rate is regular. ABDOMEN:  Soft.  Her pulse is strong.  She has got good capillary refill.  The wound is as described.  We will do Santyl and Hydrogel for the week to see how it progresses and see if we can get it granulating better.  Have to move a little bit slow because of the Coumadin and we also may need some wet to dry.  We will check a prealbumin and recommend a multivitamin that is compatible with Coumadin.     Wayland Denis, DO     CS/MEDQ  D:  07/24/2011  T:  07/24/2011  Job:  [id number]

## 2011-08-05 ENCOUNTER — Encounter: Payer: Self-pay | Admitting: Vascular Surgery

## 2011-08-05 ENCOUNTER — Ambulatory Visit (INDEPENDENT_AMBULATORY_CARE_PROVIDER_SITE_OTHER): Payer: Medicare Other | Admitting: Vascular Surgery

## 2011-08-05 ENCOUNTER — Telehealth: Payer: Self-pay | Admitting: *Deleted

## 2011-08-05 VITALS — BP 167/61 | HR 77 | Resp 16 | Ht 63.0 in | Wt 154.6 lb

## 2011-08-05 DIAGNOSIS — T8132XA Disruption of internal operation (surgical) wound, not elsewhere classified, initial encounter: Secondary | ICD-10-CM

## 2011-08-05 NOTE — Progress Notes (Signed)
Subjective:     Patient ID: [name], female   DOB: 21-Oct-1940, 70 y.o.   MRN: [id number]  HPI this 70 year old female returns today for examination of her left groin wound. When I saw her a few weeks ago there were some very superficial separation. She is being seen by the home health nurse on a daily basis. He is also been to the wound center for her left foot. The foot is now completely healed.  Review of Systems     Objective:   Physical Exam blood pressure 167/61 heart rate 77 respirations 16 Left inguinal area has an separation about 5 cm in length and 1 cm deep. There is no infection. This was cleaned and packed with a moist saline gauze. Left leg bypass is functioning well there    Assessment:    superficial separation left angle wound needs daily packing    Plan:     Continue daily packing of left inguinal wound with moist saline gauze Return to see me in 4 weeks

## 2011-08-05 NOTE — Telephone Encounter (Signed)
Right groin incision to be cleaned daily and packed with moist saline gauze.

## 2011-08-08 ENCOUNTER — Telehealth: Payer: Self-pay

## 2011-08-08 NOTE — Telephone Encounter (Addendum)
Rec'd phone call from [facility] nurse, Luther Bradley, RN.  Stated she made a visit to pt. on 10/23 to do the wound care to left groin as directed by Dr. Hart Rochester.  Stated she applied the wet-dry NS gauze to left groin and covered w/ dry gauze dsg,  HC nurse attempted to schedule pt.  For next visit, and was told by daughter that pt. Wouldn't be available the rest of the week due to other doctors appointments.  [facility] nurse stated the daughter shut the door in her face.  Also, stated daughter refused to demonstrate the dressing change or wound care.  Stated she "did not want to be responsible if pt. got an infection."  Home care wanted to make office aware that they will continue to try to visit pt., and to be aware of resistance given per daughter.

## 2011-08-11 NOTE — Progress Notes (Signed)
NAMEJOYA, WILLMOTT                  ACCOUNT NO.:  [id number]  MEDICAL RECORD NO.:  [id number]  LOCATION:  5040                         FACILITY:  [facility]  PHYSICIAN:  Pleas Koch, MD        DATE OF BIRTH:  1941-07-29                                PROGRESS NOTE   DIAGNOSES TO DATE: 1. Peripheral arterial disease with severe ankle-brachial indices on     the left foot of 0.2, consulted upon by Dr. Hart Rochester and Dr. Imogene Burn of     Vascular Surgery. 2. Likely diabetic foot ulcer secondary to plantar wound. 3. Compensated congestive heart failure with ejection fraction of 50%     with hypokinesis. 4. Hypertension. 5. Coronary artery disease, status post CABG x5 in 2009. 6. Hyperlipidemia, with markedly elevated triglycerides, on statin and     niacin. 7. Diabetes mellitus type 2, out of control but without diabetic     ketoacidosis. 8. Acute kidney injury, likely secondary to volume depletion.  Current medications are as follows: 1. Amlodipine 10 mg daily. 2. Xanax 3.25. 3. Bisacodyl 10 mg daily. 4. Coreg 25 mg b.i.d. 5. Ciprofloxacin IV q.12 h. 6. Plavix has been held for possible femoral-popliteal bypass surgery     tomorrow. 7. Lovenox 70 units q.12, (will be on hold overnight). 8. Folic acid 1 mg daily. 9. Hydralazine 20 mg q.5 h. p.o. 10.Insulin by sliding scale as well as Lantus 52 units q.p.m. 11.Niacin 500 mg daily. 12.Lovaza 4 g daily. 13.Protonix 40 mg q.12. 14.Potassium chloride 20 mEq daily. 15.Crestor 20 mg daily. 16.Vancomycin 750 mg (The patient is currently on day #9 of Cipro and     vancomycin, likely to discontinue the same status post surgery).  Pertinent consults on this admission have included: 1. Quita Skye. Hart Rochester, MD 2. Dr. Rosendo Gros of Cardiology for possible mild congestive heart     failure.  PERTINENT IMAGING DONE ON THIS ADMISSION: 1. Chest x-ray, 2-view on June 17, 2011, showed:     a.     Mild congestive changes in heart and lungs suggesting  mild      interstitial edema.     b.     Scattered calcified granulomas on the right 2. Foot x-ray done on June 17, 2011, showed:     a.     No definite areas of cortical erosion to suggest      osteomyelitis.     b.     Mild first metatarsophalangeal joint osteoarthritis. 3. MRA left foot done on June 21, 2011, showed unremarkable MRA of     the left foot. 4. Echocardiogram done on June 18, 2011, showed:     a.     Left ventricular ejection fraction 50%.     b.     Hypokinesia of the base inferior segment and Doppler      parameters consistent with high ventricular filling pressures.     c.     Aortic valve sclerosis without stenosis.  HOSPITAL COURSE TO DATE: This dictation encompasses the course of events from June 17, 2011, until June 25, 2011.  This is a 70 year old female with extensive past medical history,  who presented to the emergency room after being seen by Dr. Juleen China for a foot ulcer which she had some thickening.  She applied some plaster to this and Betadine and had to follow up with her endocrinologist at that time. She was taking Keflex for this and did not take her insulin.  She usually does take Lantus twice daily.  She also felt subjective wheezing and palpitations over the past 2 days.  Denied any chest pain.  Denied any fever, chills, cough, or phlegm.  Her blood sugars were found to be 97 initially and she was given Lasix 40 IV from the ED and worked up. On admission, blood pressure was 177/89, pulse was 60, temperature 97.8, respirations 18 per minute, O2 sats 97%.  PERTINENT EXAM FINDINGS ON ADMISSION: She has a 2/6-3/6 left upper sternal edge murmur.  She had tenderness in the left plantar aspect of the left foot with small millimeter ulceration.  No active discharge.  Tenderness around small ulcerated areas about 1 cm.  No tenderness in dorsal aspect.  There are no acute ischemic changes, cyanosis, or clubbing.  EKG initially showed  right bundle-branch block with nonspecific changes. Pertinent positives, otherwise, WBC 9.9.  BUN and creatinine 26 and 1.6. Point-of-care markers showed a relative index 2.1.  Troponin less than 0.01.  Dig level was 0.7.  PROBLEMS: 1. Foot ulcer.  Vascular Surgery is consulted regarding the same on     June 19, 2011.  It was noted that she had an arteriogram done     by them which showed ABIs showing 0.2% flow on the left femoral     popliteal occlusive disease with ischemic changes to left foot and     nonhealing foot wound, plantar surface of foot.  She essentially     was seen for the same, and subsequently, it has been planned that     she will have a fem-pop bypass on June 26, 2011. 2. Superficial thrombophlebitis of left lower extremity was noted on     June 23, 2011, that she developed some pain in her left calf     which prompted a Doppler ultrasound.  It was noted that the Doppler     ultrasound was positive for superficial phlebitis and she has been     on a therapeutic dose now for about 2-3 days.  This is a good idea,     likely because of the fact that she has to be held off her Plavix     in order to noted to have the vascular surgery.  Further     recommendations with regard to anticoagulation will be made along     with Vascular Surgery.  There is no strong evidence to support     continued use of Plavix for superficial phlebitis as the risk for     migration into the deep venous system is conflictory. 3. Acute kidney injury.  The patient did have an arteriogram done by     Vascular Surgery and she suffered a bump in her creatinine.  As     such, she likely sustained some renal insufficiency because of     this.  Her creatinine bumped as high as 1.73 on June 23, 2011,     and subsequently it was trended down to 1.44 today. 4. Diabetes mellitus.  Her blood sugars have been moderately well     controlled.  She is currently on Lantus at her home dose of  52  units.  She will continue on the same with sliding scale insulin.     She will likely need a half the dose of her Lantus overnight     tonight, and I will change the effective dose to about 26 units.     After tonight's midnight, she will need to be covered with q.4 h.     coverage until surgery.  We will hold off on keeping her on D5     water until surgery is performed. 5. Hypertension.  She is currently on hydralazine 20 mg scheduled q.4     h. and as well as amlodipine 10 mg daily and Coreg 25 mg b.i.d.     with meals.  She will continue on the same.  I have held off on her     lisinopril that she was on in the past as well as her     hydrochlorothiazide given the bump in the creatinine that she     sustained and these will hopefully be reimplanted as an outpatient. 6. Coronary artery disease.  She has a history of CABG in the past and     will need her ACE inhibitor reimplanted slowly once all of vascular     surgery issues are done.  She currently remains on a beta-blocker.     Her Plavix has been held once again because she is going for     vascular surgery tomorrow. 7. Hyperlipidemia.  The patient will continue on her Crestor 20 mg as     well as on her niacin and Lovaza.  The patient stated she was slightly nauseous today.  She had no other real concerns.  Her foot was still paining, but it was much decreased as compared to prior.  She says she has not passed stool and this may be the cause for some of her nausea.  As such, I will give her Fleet's enema today and we will review the patient in the morning.  I have tried to contact her daughter, Velna Hatchet, once again regarding her care with no success and I called her today at cell phone number [phone] in the presence of the patient.  I have left an extensive message regarding an update on the patient to Velna Hatchet, who is acting as healthcare power of attorney, although the patient does have capacity and we will hopefully  be able to discharge her once surgery is done.  PHYSICAL EXAMINATION: VITAL SIGNS:  Today, 98.4 temperature, pulse 51, respirations 16, blood pressure 133-145/67-90, and 98% on room air. CARDIAC:  S1 and S2.  Murmur 3/6 right upper sternal edge. ABDOMEN:  Soft, nontender, nondistended. CHEST:  Clinically clear. EXTREMITIES:  No lower extremity edema.  Further addendum will be made once surgery is done and the patient seems stable for discharge.  It is a pleasure taking care of this patient.          ______________________________ Pleas Koch, MD     JS/MEDQ  D:  06/25/2011  T:  06/25/2011  Job:  [id number]  cc:   Burnell Blanks, MD Fax: [phone]  Brooke Bonito, M.D. Fax: [phone]  [facility]  Fransisco Hertz, MD  Electronically Signed by Pleas Koch MD on 08/11/2011 08:08:52 PM

## 2011-08-13 ENCOUNTER — Ambulatory Visit: Payer: Medicare Other | Admitting: Vascular Surgery

## 2011-08-16 ENCOUNTER — Encounter (HOSPITAL_BASED_OUTPATIENT_CLINIC_OR_DEPARTMENT_OTHER): Payer: Medicare Other | Attending: Plastic Surgery

## 2011-08-16 DIAGNOSIS — Y838 Other surgical procedures as the cause of abnormal reaction of the patient, or of later complication, without mention of misadventure at the time of the procedure: Secondary | ICD-10-CM | POA: Insufficient documentation

## 2011-08-16 DIAGNOSIS — L98499 Non-pressure chronic ulcer of skin of other sites with unspecified severity: Secondary | ICD-10-CM | POA: Insufficient documentation

## 2011-08-16 DIAGNOSIS — E1169 Type 2 diabetes mellitus with other specified complication: Secondary | ICD-10-CM | POA: Insufficient documentation

## 2011-08-16 DIAGNOSIS — T8189XA Other complications of procedures, not elsewhere classified, initial encounter: Secondary | ICD-10-CM | POA: Insufficient documentation

## 2011-08-16 DIAGNOSIS — L97509 Non-pressure chronic ulcer of other part of unspecified foot with unspecified severity: Secondary | ICD-10-CM | POA: Insufficient documentation

## 2011-09-02 ENCOUNTER — Encounter: Payer: Self-pay | Admitting: Vascular Surgery

## 2011-09-03 ENCOUNTER — Encounter: Payer: Self-pay | Admitting: Vascular Surgery

## 2011-09-03 ENCOUNTER — Ambulatory Visit (INDEPENDENT_AMBULATORY_CARE_PROVIDER_SITE_OTHER): Payer: Medicare Other | Admitting: Vascular Surgery

## 2011-09-03 VITALS — BP 170/80 | HR 67 | Temp 98.1°F | Ht 64.0 in | Wt 154.0 lb

## 2011-09-03 DIAGNOSIS — Z95828 Presence of other vascular implants and grafts: Secondary | ICD-10-CM

## 2011-09-03 DIAGNOSIS — I739 Peripheral vascular disease, unspecified: Secondary | ICD-10-CM

## 2011-09-03 DIAGNOSIS — L98499 Non-pressure chronic ulcer of skin of other sites with unspecified severity: Secondary | ICD-10-CM

## 2011-09-03 DIAGNOSIS — Z9889 Other specified postprocedural states: Secondary | ICD-10-CM

## 2011-09-03 NOTE — Progress Notes (Signed)
Subjective:     Patient ID: [name], female   DOB: 1941/01/27, 70 y.o.   MRN: [id number]  HPI patient returns for further followup regarding the left inguinal wound. Bypass was done 06/26/2011 using saphenous vein from the left leg. The left inguinal wound has now completely healed following visits to the wound Center. The ulceration on the plantar surface of the left foot has also healed her only complaint is some mild edema in the left leg. She is elevating her leg at night.  Past Medical History  Diagnosis Date  . CAD (coronary artery disease)   . Hypertension   . Diabetes mellitus     type 2 insulin dependent  . Hyperlipidemia   . CHF (congestive heart failure)   . Cellulitis     left foot  . Ulcer     diabetic ulcer on left foot  . DVT (deep venous thrombosis)     peroneal vein  . Peripheral vascular disease   . Chronic kidney disease   . Anemia     History  Substance Use Topics  . Smoking status: Former Smoker    Types: Cigarettes    Quit date: 07/15/1996  . Smokeless tobacco: Former Neurosurgeon    Types: Snuff  . Alcohol Use: No    Family History  Problem Relation Age of Onset  . Cancer Mother     Kidney  . Kidney disease Mother   . Cancer Father     skin    Allergies  Allergen Reactions  . Aspirin Hives    Current outpatient prescriptions:amLODipine (NORVASC) 5 MG tablet, Take 5 mg by mouth daily.  , Disp: , Rfl: ;  atorvastatin (LIPITOR) 40 MG tablet, Take 40 mg by mouth daily.  , Disp: , Rfl: ;  carvedilol (COREG) 12.5 MG tablet, Take 12.5 mg by mouth 2 (two) times daily with a meal.  , Disp: , Rfl: ;  clopidogrel (PLAVIX) 75 MG tablet, Take 75 mg by mouth daily.  , Disp: , Rfl:  digoxin (LANOXIN) 0.125 MG tablet, Take 125 mcg by mouth daily.  , Disp: , Rfl: ;  ferrous sulfate 325 (65 FE) MG tablet, Take 325 mg by mouth 2 (two) times daily.  , Disp: , Rfl: ;  folic acid (FOLVITE) 1 MG tablet, Take 1 mg by mouth daily.  , Disp: , Rfl: ;  insulin glargine (LANTUS)  100 UNIT/ML injection, Inject 35 Units into the skin 2 (two) times daily.  , Disp: , Rfl:  insulin glulisine (APIDRA) 100 UNIT/ML injection, Inject 10 Units into the skin 3 (three) times daily before meals.  , Disp: , Rfl: ;  lisinopril (PRINIVIL,ZESTRIL) 40 MG tablet, Take 40 mg by mouth daily.  , Disp: , Rfl: ;  niacin (NIASPAN) 500 MG CR tablet, Take 500 mg by mouth at bedtime.  , Disp: , Rfl: ;  omega-3 acid ethyl esters (LOVAZA) 1 G capsule, Take 2 g by mouth 2 (two) times daily.  , Disp: , Rfl:  omeprazole (PRILOSEC) 20 MG capsule, Take 20 mg by mouth daily.  , Disp: , Rfl: ;  polyethylene glycol (MIRALAX / GLYCOLAX) packet, Take 17 g by mouth daily.  , Disp: , Rfl: ;  traMADol (ULTRAM) 50 MG tablet, Take 50 mg by mouth every 6 (six) hours as needed.  , Disp: , Rfl: ;  warfarin (COUMADIN) 3 MG tablet, Take 3 mg by mouth daily.  , Disp: , Rfl:   BP 170/80  Pulse 67  Temp(Src) 98.1 F (36.7 C) (Oral)  Ht 5\' 4"  (1.626 m)  Wt 154 lb (69.854 kg)  BMI 26.43 kg/m2  Body mass index is 26.43 kg/(m^2).         Review of Systems     Objective:   Physical Exam Blood pressure today 170/80 heart rate 67 respirations 14 Left leg incisions have all healed nicely. The groin incision is completely healed. There is a 3+ popliteal graft pulse. There is 1+ edema below the knee. The left foot is well-perfused and ulcer on the plantar surface of the foot has healed completely    Assessment:    patent left femoral-popliteal saphenous vein graft with healed ulceration on foot    Plan:     Return in one month for initial duplex scan of left femoral-popliteal graft for protocol followup

## 2011-10-03 ENCOUNTER — Ambulatory Visit (INDEPENDENT_AMBULATORY_CARE_PROVIDER_SITE_OTHER): Payer: Medicare Other | Admitting: *Deleted

## 2011-10-03 ENCOUNTER — Other Ambulatory Visit (INDEPENDENT_AMBULATORY_CARE_PROVIDER_SITE_OTHER): Payer: Medicare Other | Admitting: *Deleted

## 2011-10-03 DIAGNOSIS — Z48812 Encounter for surgical aftercare following surgery on the circulatory system: Secondary | ICD-10-CM

## 2011-10-03 DIAGNOSIS — Z95828 Presence of other vascular implants and grafts: Secondary | ICD-10-CM

## 2011-10-03 DIAGNOSIS — I739 Peripheral vascular disease, unspecified: Secondary | ICD-10-CM

## 2011-10-03 DIAGNOSIS — L98499 Non-pressure chronic ulcer of skin of other sites with unspecified severity: Secondary | ICD-10-CM

## 2011-10-09 NOTE — Procedures (Unsigned)
BYPASS GRAFT EVALUATION  INDICATION:  Followup left fem-pop graft.  HISTORY: Diabetes:  Yes Cardiac:  No Hypertension:  Yes Smoking:  Previous Previous Surgery:  Left fem-pop graft 06/26/2011  SINGLE LEVEL ARTERIAL EXAM                              RIGHT              LEFT Brachial:                    175                173 Anterior tibial:             119                149 Posterior tibial:            123                149 Peroneal: Ankle/brachial index:        0.70               0.85  PREVIOUS ABI:  Date:  07/16/2011  RIGHT:  0.66  LEFT:  0.84   LOWER EXTREMITY BYPASS GRAFT DUPLEX EXAM:  DUPLEX:  Inflow and proximal anastomosis of the left femoral popliteal graft are very difficult to visualize due to scar tissue and enlarged lymph nodes.  Significantly elevated velocities are observed in the area with post stenotic turbulence in the proximal graft waveforms. The remainder of the graft is widely patent.   IMPRESSION:  Patent graft with significantly elevated velocities in the proximal anastomosis as described above.       ___________________________________________ Quita Skye. Hart Rochester, M.D.  LT/MEDQ  D:  10/03/2011  T:  10/03/2011  Job:  [id number]

## 2011-10-28 ENCOUNTER — Encounter: Payer: Self-pay | Admitting: Vascular Surgery

## 2011-10-29 ENCOUNTER — Encounter: Payer: Self-pay | Admitting: Vascular Surgery

## 2011-10-29 ENCOUNTER — Ambulatory Visit (INDEPENDENT_AMBULATORY_CARE_PROVIDER_SITE_OTHER): Payer: Medicare Other | Admitting: Vascular Surgery

## 2011-10-29 VITALS — BP 156/119 | HR 124 | Resp 20 | Ht 63.0 in | Wt 166.0 lb

## 2011-10-29 DIAGNOSIS — I739 Peripheral vascular disease, unspecified: Secondary | ICD-10-CM | POA: Insufficient documentation

## 2011-10-29 NOTE — Progress Notes (Signed)
Subjective:     Patient ID: [name], female   DOB: [birth year], 71 y.o.   MRN: [id number]  HPI this 71 year old female had a left femoral-popliteal bypass graft performed in September of 2012 for a nonhealing ulcer on the plantar aspect of the left foot. This ulcer subsequently healed and she has had chronic edema in the left leg since that time with no DVT. She is elevating her legs occasionally during the day but is not worn elastic compression stockings. She has some aching discomfort radiating down the leg but no claudication symptoms. She has also noticed some mild pain on the contralateral right leg.  Past Medical History  Diagnosis Date  . CAD (coronary artery disease)   . Hypertension   . Diabetes mellitus     type 2 insulin dependent  . Hyperlipidemia   . CHF (congestive heart failure)   . Cellulitis     left foot  . Ulcer     diabetic ulcer on left foot  . DVT (deep venous thrombosis)     peroneal vein  . Peripheral vascular disease   . Chronic kidney disease   . Anemia     History  Substance Use Topics  . Smoking status: Former Smoker -- 2.0 packs/day for 20 years    Types: Cigarettes    Quit date: 07/15/1996  . Smokeless tobacco: Former Neurosurgeon    Types: Snuff  . Alcohol Use: No    Family History  Problem Relation Age of Onset  . Cancer Mother     Kidney  . Kidney disease Mother   . Cancer Father     skin    Allergies  Allergen Reactions  . Aspirin Hives    Current outpatient prescriptions:amLODipine (NORVASC) 5 MG tablet, Take 5 mg by mouth daily.  , Disp: , Rfl: ;  atorvastatin (LIPITOR) 40 MG tablet, Take 40 mg by mouth daily.  , Disp: , Rfl: ;  carvedilol (COREG) 12.5 MG tablet, Take 12.5 mg by mouth 2 (two) times daily with a meal.  , Disp: , Rfl: ;  clopidogrel (PLAVIX) 75 MG tablet, Take 75 mg by mouth daily.  , Disp: , Rfl:  digoxin (LANOXIN) 0.125 MG tablet, Take 125 mcg by mouth daily.  , Disp: , Rfl: ;  ferrous sulfate 325 (65 FE) MG tablet, Take  325 mg by mouth 2 (two) times daily.  , Disp: , Rfl: ;  folic acid (FOLVITE) 1 MG tablet, Take 1 mg by mouth daily.  , Disp: , Rfl: ;  insulin glargine (LANTUS) 100 UNIT/ML injection, Inject 35 Units into the skin 2 (two) times daily.  , Disp: , Rfl:  insulin glulisine (APIDRA) 100 UNIT/ML injection, Inject 10 Units into the skin 3 (three) times daily before meals.  , Disp: , Rfl: ;  lisinopril (PRINIVIL,ZESTRIL) 40 MG tablet, Take 40 mg by mouth daily.  , Disp: , Rfl: ;  niacin (NIASPAN) 500 MG CR tablet, Take 500 mg by mouth at bedtime.  , Disp: , Rfl: ;  omega-3 acid ethyl esters (LOVAZA) 1 G capsule, Take 2 g by mouth 2 (two) times daily.  , Disp: , Rfl:  omeprazole (PRILOSEC) 20 MG capsule, Take 20 mg by mouth daily.  , Disp: , Rfl: ;  polyethylene glycol (MIRALAX / GLYCOLAX) packet, Take 17 g by mouth daily.  , Disp: , Rfl: ;  traMADol (ULTRAM) 50 MG tablet, Take 50 mg by mouth every 6 (six) hours as needed.  , Disp: ,  Rfl: ;  warfarin (COUMADIN) 3 MG tablet, Take 3 mg by mouth daily.  , Disp: , Rfl:   BP 156/119  Pulse 124  Resp 20  Ht 5\' 3"  (1.6 m)  Wt 166 lb (75.297 kg)  BMI 29.41 kg/m2  Body mass index is 29.41 kg/(m^2).         Review of Systems denies chest pain, dyspnea on exertion, PND, orthopnea, generalized edema, history of DVT, and all other symptoms in the complete review of systems.    Objective:   Physical Exam blood pressure 106/119 heart rate 120 respirations 20 General well-developed well-nourished female no apparent stress and oriented x3 Lungs no rhonchi or wheezing Cardiovascular rate rhythm no murmurs carotid pulses 3+ no audible bruits Abdomen soft nontender with no masses Left leg does have diffuse edema from the groin to the ankle with a 3 cm circumference difference in the calf a 4 cm in circumference difference in the distal thigh. There is a 3+ popliteal graft pulse. The distal calf wound is well-healed. There are no active ulcerations. Right leg has 3+  femoral pulse is free of ulcerations.  Venous duplex exam and scan of the graft was performed December of 2012 I reviewed that. ABI is stable at 0.85. There is elevated velocity at the proximal anastomosis on the left lung scar tissue is located here. The osseous 390 cm/s.     Assessment:     Widely patent left femoral-popliteal bypass graft with increased velocities proximal anastomosis and chronic edema    Plan:     #1 long-leg elastic compression stockings #2 elevate foot of bed 3 inches and apply stockings first thing in the a.m. #3 return in 6 months with duplex scan of the graft to look at velocity proximal anastomosis and check ABI

## 2011-10-29 NOTE — Progress Notes (Signed)
Addended by: Sharee Pimple on: 10/29/2011 10:21 AM   Modules accepted: Orders

## 2011-12-11 ENCOUNTER — Encounter: Payer: Self-pay | Admitting: *Deleted

## 2011-12-11 NOTE — Progress Notes (Signed)
Patient ID: [name], female   DOB: 17-Jul-1941, 71 y.o.   MRN: [id number] [name] walked into [facility] office wanting to be measured for compression stockings.  She had gone to [facility] in [place], [place] but did not have a prescription for compression hose or her measurements nor did she know the strength of compression that Dr. Hart Rochester had advised.  After reviewing her last visit with Dr. Hart Rochester on 10-29-2011, I prepared a prescription for thigh high compression stockings 20-30 mm Hg and measured both of her legs.  Ankle 22 cm   Calf 38 cm  Thigh 61-62 cm.  [name] states she will go to [facility] to purchase compression hose.  Encouraged [name] to call [facility] for further questions or concerns.  Kayra Crowell, Neena Rhymes

## 2012-01-28 ENCOUNTER — Encounter (HOSPITAL_COMMUNITY): Payer: Self-pay

## 2012-01-28 ENCOUNTER — Emergency Department (HOSPITAL_COMMUNITY)
Admission: EM | Admit: 2012-01-28 | Discharge: 2012-01-28 | Disposition: A | Discharge status: Home / Self Care | Payer: Medicare Other | Attending: Emergency Medicine | Admitting: Emergency Medicine

## 2012-01-28 DIAGNOSIS — I2510 Atherosclerotic heart disease of native coronary artery without angina pectoris: Secondary | ICD-10-CM | POA: Insufficient documentation

## 2012-01-28 DIAGNOSIS — Z794 Long term (current) use of insulin: Secondary | ICD-10-CM | POA: Insufficient documentation

## 2012-01-28 DIAGNOSIS — I129 Hypertensive chronic kidney disease with stage 1 through stage 4 chronic kidney disease, or unspecified chronic kidney disease: Secondary | ICD-10-CM | POA: Insufficient documentation

## 2012-01-28 DIAGNOSIS — Z951 Presence of aortocoronary bypass graft: Secondary | ICD-10-CM | POA: Insufficient documentation

## 2012-01-28 DIAGNOSIS — E119 Type 2 diabetes mellitus without complications: Secondary | ICD-10-CM | POA: Insufficient documentation

## 2012-01-28 DIAGNOSIS — N189 Chronic kidney disease, unspecified: Secondary | ICD-10-CM | POA: Insufficient documentation

## 2012-01-28 DIAGNOSIS — S40869A Insect bite (nonvenomous) of unspecified upper arm, initial encounter: Secondary | ICD-10-CM

## 2012-01-28 DIAGNOSIS — E785 Hyperlipidemia, unspecified: Secondary | ICD-10-CM | POA: Insufficient documentation

## 2012-01-28 DIAGNOSIS — D649 Anemia, unspecified: Secondary | ICD-10-CM | POA: Insufficient documentation

## 2012-01-28 DIAGNOSIS — S40269A Insect bite (nonvenomous) of unspecified shoulder, initial encounter: Secondary | ICD-10-CM | POA: Insufficient documentation

## 2012-01-28 DIAGNOSIS — W57XXXA Bitten or stung by nonvenomous insect and other nonvenomous arthropods, initial encounter: Secondary | ICD-10-CM | POA: Insufficient documentation

## 2012-01-28 DIAGNOSIS — Z86718 Personal history of other venous thrombosis and embolism: Secondary | ICD-10-CM | POA: Insufficient documentation

## 2012-01-28 DIAGNOSIS — I739 Peripheral vascular disease, unspecified: Secondary | ICD-10-CM | POA: Insufficient documentation

## 2012-01-28 DIAGNOSIS — Z87891 Personal history of nicotine dependence: Secondary | ICD-10-CM | POA: Insufficient documentation

## 2012-01-28 NOTE — ED Provider Notes (Signed)
History     CSN: [id number]  Arrival date & time 01/28/12  4098   First MD Initiated Contact with Patient 01/28/12 [phone]      Chief Complaint  Patient presents with  . Tick Removal    (Consider location/radiation/quality/duration/timing/severity/associated sxs/prior treatment) HPI Comments: Patient reports that she noticed a tic in her left axilla this morning.  She did not try to remove it herself or put anything on the area.  She is not having any symptoms at this time.  Denies any rash.  She is unsure when the tic bit her.    The history is provided by the patient.    Past Medical History  Diagnosis Date  . CAD (coronary artery disease)   . Hypertension   . Diabetes mellitus     type 2 insulin dependent  . Hyperlipidemia   . CHF (congestive heart failure)   . Cellulitis     left foot  . Ulcer     diabetic ulcer on left foot  . DVT (deep venous thrombosis)     peroneal vein  . Peripheral vascular disease   . Chronic kidney disease   . Anemia     Past Surgical History  Procedure Date  . Coronary artery bypass graft   . Tubal ligation   . Pr vein bypass graft,aorto-fem-pop 06-26-11    Left Common femoral to BK popliteal BPG    Family History  Problem Relation Age of Onset  . Cancer Mother     Kidney  . Kidney disease Mother   . Cancer Father     skin    History  Substance Use Topics  . Smoking status: Former Smoker -- 2.0 packs/day for 20 years    Types: Cigarettes    Quit date: 07/15/1996  . Smokeless tobacco: Former Neurosurgeon    Types: Snuff  . Alcohol Use: No    OB History    Grav Para Term Preterm Abortions TAB SAB Ect Mult Living                  Review of Systems  Constitutional: Negative for fever and chills.  Musculoskeletal: Negative for joint swelling.  Skin: Negative for color change and rash.    Allergies  Aspirin  Home Medications   Current Outpatient Rx  Name Route Sig Dispense Refill  . AMLODIPINE BESYLATE 5 MG PO TABS Oral  Take 5 mg by mouth daily.      . ATORVASTATIN CALCIUM 40 MG PO TABS Oral Take 40 mg by mouth daily.      [name] CARVEDILOL 12.5 MG PO TABS Oral Take 12.5 mg by mouth 2 (two) times daily with a meal.      . CLOPIDOGREL BISULFATE 75 MG PO TABS Oral Take 75 mg by mouth daily.      [name] DIGOXIN 0.125 MG PO TABS Oral Take 125 mcg by mouth daily.      [name] FERROUS SULFATE 325 (65 FE) MG PO TABS Oral Take 325 mg by mouth 2 (two) times daily.      [name] FOLIC ACID 1 MG PO TABS Oral Take 1 mg by mouth daily.      . INSULIN GLARGINE 100 UNIT/ML [place] SOLN Subcutaneous Inject 50 Units into the skin 2 (two) times daily.     . INSULIN GLULISINE 100 UNIT/ML [place] SOLN Subcutaneous Inject 10 Units into the skin 2 (two) times daily before a meal.     . LISINOPRIL 40 MG PO TABS Oral Take  40 mg by mouth daily.      [name] NIACIN ER (ANTIHYPERLIPIDEMIC) 500 MG PO TBCR Oral Take 500 mg by mouth at bedtime.      . OMEGA-3-ACID ETHYL ESTERS 1 G PO CAPS Oral Take 4 g by mouth daily.     [name] OMEPRAZOLE 20 MG PO CPDR Oral Take 20 mg by mouth daily.      [name] POLYETHYLENE GLYCOL 3350 PO PACK Oral Take 17 g by mouth daily.      . WARFARIN SODIUM 4 MG PO TABS Oral Take 4-6 mg by mouth. TAKES 1 TABLET (4 MG) MON, WEDS, FRI & SUN.  TAKES 1.5 TABLET (6 MG) TUES, THURS, SAT.      BP 167/64  Pulse 71  Temp(Src) 97.4 F (36.3 C) (Oral)  Resp 18  SpO2 96%  Physical Exam  Nursing note and vitals reviewed. Constitutional: She appears well-developed and well-nourished. No distress.  HENT:  Head: Normocephalic and atraumatic.  Cardiovascular: Normal rate, regular rhythm and normal heart sounds.   Pulmonary/Chest: Effort normal and breath sounds normal.  Neurological: She is alert.  Skin: Skin is warm and dry. No rash noted. She is not diaphoretic. No erythema.       Small tic on the surface of  left axilla.  Psychiatric: She has a normal mood and affect.    ED Course  Procedures (including critical care time)  Labs Reviewed - No data to  display No results found.   No diagnosis found.  Tic removed, including the head.  Patient given education about Lyme Disease and RMSF.  MDM  Patient with tic bite.  Tic removed in the ED.  No surrounding erythema.  No rash present.  Patient given education about Lyme Disease and RMSF and instructed to return if rash develops.        Pascal Lux Dickinson, PA-C 01/28/12 1742

## 2012-01-28 NOTE — ED Notes (Signed)
Tick removed by PA.

## 2012-01-28 NOTE — Discharge Instructions (Signed)
Return to the Emergency Department if you develop a rash on your hands and feet or a rash around the site of the tick bite.

## 2012-01-28 NOTE — ED Notes (Signed)
Pt sts tick under left arm, noticed this am.

## 2012-01-29 NOTE — ED Provider Notes (Signed)
Medical screening examination/treatment/procedure(s) were performed by non-physician practitioner and as supervising physician I was immediately available for consultation/collaboration.    Tawyna Pellot L Cassidie Veiga, MD 01/29/12 1525 

## 2012-05-04 ENCOUNTER — Encounter: Payer: Self-pay | Admitting: Neurosurgery

## 2012-05-05 ENCOUNTER — Ambulatory Visit: Payer: Medicare Other | Admitting: Neurosurgery

## 2012-05-12 ENCOUNTER — Encounter: Payer: Self-pay | Admitting: Neurosurgery

## 2012-05-13 ENCOUNTER — Encounter: Payer: Self-pay | Admitting: Neurosurgery

## 2012-05-13 ENCOUNTER — Encounter (INDEPENDENT_AMBULATORY_CARE_PROVIDER_SITE_OTHER): Payer: Medicare Other | Admitting: *Deleted

## 2012-05-13 ENCOUNTER — Ambulatory Visit (INDEPENDENT_AMBULATORY_CARE_PROVIDER_SITE_OTHER): Payer: Medicare Other | Admitting: Neurosurgery

## 2012-05-13 VITALS — BP 166/69 | HR 63 | Resp 16 | Ht 63.0 in | Wt 170.3 lb

## 2012-05-13 DIAGNOSIS — I739 Peripheral vascular disease, unspecified: Secondary | ICD-10-CM | POA: Insufficient documentation

## 2012-05-13 DIAGNOSIS — Z48812 Encounter for surgical aftercare following surgery on the circulatory system: Secondary | ICD-10-CM

## 2012-05-13 NOTE — Progress Notes (Signed)
[facility] Office Note  CC: Bypass graft evaluation and ABIs surveillance Referring Physician: Hart Rochester  History of Present Illness: 71 year old female patient of Dr. Hart Rochester who is status post a left femoral popliteal bypass graft that was completed in September 2012. The patient reports good relief of any claudication symptoms, she has no open wounds on her lower extremities and no rest pain. Patient states she does have a "shooting pain" in her right ankle from time to time but she is able to ambulate without difficulty. The patient  Reports no new medical diagnoses or recent surgery.  Past Medical History  Diagnosis Date  . CAD (coronary artery disease)   . Hypertension   . Diabetes mellitus     type 2 insulin dependent  . Hyperlipidemia   . CHF (congestive heart failure)   . Cellulitis     left foot  . Ulcer     diabetic ulcer on left foot  . DVT (deep venous thrombosis)     peroneal vein  . Peripheral vascular disease   . Chronic kidney disease   . Anemia     ROS: [x]  Positive   [ ]  Denies    General: [ ]  Weight loss, [ ]  Fever, [ ]  chills Neurologic: [ ]  Dizziness, [ ]  Blackouts, [ ]  Seizure [ ]  Stroke, [ ]  "Mini stroke", [ ]  Slurred speech, [ ]  Temporary blindness; [ ]  weakness in arms or legs, [ ]  Hoarseness Cardiac: [ ]  Chest pain/pressure, [ ]  Shortness of breath at rest [ ]  Shortness of breath with exertion, [ ]  Atrial fibrillation or irregular heartbeat Vascular: [x ] Pain in legs with walking, [ ]  Pain in legs at rest, [ ]  Pain in legs at night,  [ ]  Non-healing ulcer, [ ]  Blood clot in vein/DVT,   Pulmonary: [ ]  Home oxygen, [ ]  Productive cough, [ ]  Coughing up blood, [ ]  Asthma,  [ ]  Wheezing Musculoskeletal:  [ ]  Arthritis, [ ]  Low back pain, [ ]  Joint pain Hematologic: [ ]  Easy Bruising, [ ]  Anemia; [ ]  Hepatitis Gastrointestinal: [ ]  Blood in stool, [ ]  Gastroesophageal Reflux/heartburn, [ ]  Trouble swallowing Urinary: [  ] chronic Kidney disease, [ ]  on HD - [ ]  MWF or [ ]  TTHS, [ ]  Burning with urination, [ ]  Difficulty urinating Skin: [ ]  Rashes, [ ]  Wounds Psychological: [ ]  Anxiety, [ ]  Depression   Social History History  Substance Use Topics  . Smoking status: Former Smoker -- 2.0 packs/day for 20 years    Types: Cigarettes    Quit date: 07/15/1996  . Smokeless tobacco: Former Neurosurgeon    Types: Snuff  . Alcohol Use: No    Family History Family History  Problem Relation Age of Onset  . Cancer Mother     Kidney  . Kidney disease Mother   . Cancer Father     skin    Allergies  Allergen Reactions  . Aspirin Hives    Current Outpatient Prescriptions  Medication Sig Dispense Refill  . amLODipine (NORVASC) 5 MG tablet Take 5 mg by mouth daily.        [name] atorvastatin (LIPITOR) 40 MG tablet Take 40 mg by mouth daily.        . carvedilol (COREG) 12.5 MG tablet Take 12.5 mg by mouth 2 (two) times daily with a meal.        . clopidogrel (PLAVIX) 75 MG tablet Take 75 mg by mouth daily.        [name]  digoxin (LANOXIN) 0.125 MG tablet Take 125 mcg by mouth daily.        . ferrous sulfate 325 (65 FE) MG tablet Take 325 mg by mouth 2 (two) times daily.        . folic acid (FOLVITE) 1 MG tablet Take 1 mg by mouth daily.        . insulin glargine (LANTUS) 100 UNIT/ML injection Inject 50 Units into the skin 2 (two) times daily.       . insulin glulisine (APIDRA) 100 UNIT/ML injection Inject 10 Units into the skin 2 (two) times daily before a meal.       . lisinopril (PRINIVIL,ZESTRIL) 40 MG tablet Take 40 mg by mouth daily.        . niacin (NIASPAN) 500 MG CR tablet Take 500 mg by mouth at bedtime.        [name] omega-3 acid ethyl esters (LOVAZA) 1 G capsule Take 4 g by mouth daily.       [name] omeprazole (PRILOSEC) 20 MG capsule Take 20 mg by mouth daily.        . polyethylene glycol (MIRALAX / GLYCOLAX) packet Take 17 g by mouth daily.        [name] warfarin (COUMADIN) 4 MG tablet Take 4-6 mg by mouth. TAKES 1 TABLET (4  MG) MON, WEDS, FRI & SUN.  TAKES 1.5 TABLET (6 MG) TUES, THURS, SAT.        Physical Examination  Filed Vitals:   05/13/12 1105  BP: 166/69  Pulse: 63  Resp: 16    Body mass index is 30.17 kg/(m^2).  General:  WDWN in NAD Gait: Normal HEENT: WNL Eyes: Pupils equal Pulmonary: normal non-labored breathing , without Rales, rhonchi,  wheezing Cardiac: RRR, without  Murmurs, rubs or gallops; No carotid bruits Abdomen: soft, NT, no masses Skin: no rashes, ulcers noted Vascular Exam/Pulses: Palpable lower extremity pulses bilaterally, femoral pulses are 2+ bilaterally, no carotid bruits are heard  Extremities without ischemic changes, no Gangrene , no cellulitis; no open wounds;  Musculoskeletal: no muscle wasting or atrophy  Neurologic: A&O X 3; Appropriate Affect ; SENSATION: normal; MOTOR FUNCTION:  moving all extremities equally. Speech is fluent/normal  Non-Invasive Vascular Imaging: ABIs today are 0.7 on the right and monophasic, she is 0.90 and biphasic to triphasic on the left with a patent left lower extremity bypass graft, no velocity increases are noted. Previous ABIs in December of 2012.7 on the right, 0.85 on the left  ASSESSMENT/PLAN: This patient has improved claudication symptoms since her left lower extremity bypass graft, she has no rest pain and seems to be doing better. The patient will followup here in 6 months for repeat bypass graft duplex and ABIs. Her questions were encouraged and answered, she is in agreement with this plan.  Lauree Chandler ANP  Clinic M.D.: Edilia Bo

## 2012-05-13 NOTE — Addendum Note (Signed)
Addended by: Sharee Pimple on: 05/13/2012 12:39 PM   Modules accepted: Orders

## 2012-05-19 LAB — PULMONARY FUNCTION TEST

## 2012-05-25 NOTE — Procedures (Unsigned)
BYPASS GRAFT EVALUATION  INDICATION:  Bypass graft.  HISTORY: Diabetes:  Yes. Cardiac:  No. Hypertension:  Yes. Smoking:  Previous. Previous Surgery:  Left fem-pop bypass graft on 06/26/2011.  SINGLE LEVEL ARTERIAL EXAM                              RIGHT              LEFT Brachial: Anterior tibial: Posterior tibial: Peroneal: Ankle/brachial index:  PREVIOUS ABI:  Date:  RIGHT:  LEFT:  LOWER EXTREMITY BYPASS GRAFT DUPLEX EXAM:  DUPLEX: 1. Biphasic Doppler waveforms noted throughout the left fem-pop bypass     graft with no internal vessel narrowing noted. 2. No evidence of velocities of greater than 180 cm/s noted in the     left lower extremity bypass graft.  IMPRESSION: 1. Patent left femoral-popliteal bypass graft with no evidence of     stenosis. 2. Velocities of greater than 200 cm/s noted on the previous exam on     10/03/2011 were not adequately visualized during this exam. 3. Bilateral ankle brachial indices are noted on separate report.  ___________________________________________ Quita Skye. Hart Rochester, M.D.  CH/MEDQ  D:  05/14/2012  T:  05/14/2012  Job:  [id number]

## 2012-07-22 IMAGING — CR DG ANG/EXT/UNI/OR LEFT
1 series · 1 of 1 positions shown · non-contrast
Comparison: None

CLINICAL DATA: Left leg fem-pop bypass graft

LEFT EXTREMITY ARTERIOGRAPHY

[view not recorded]
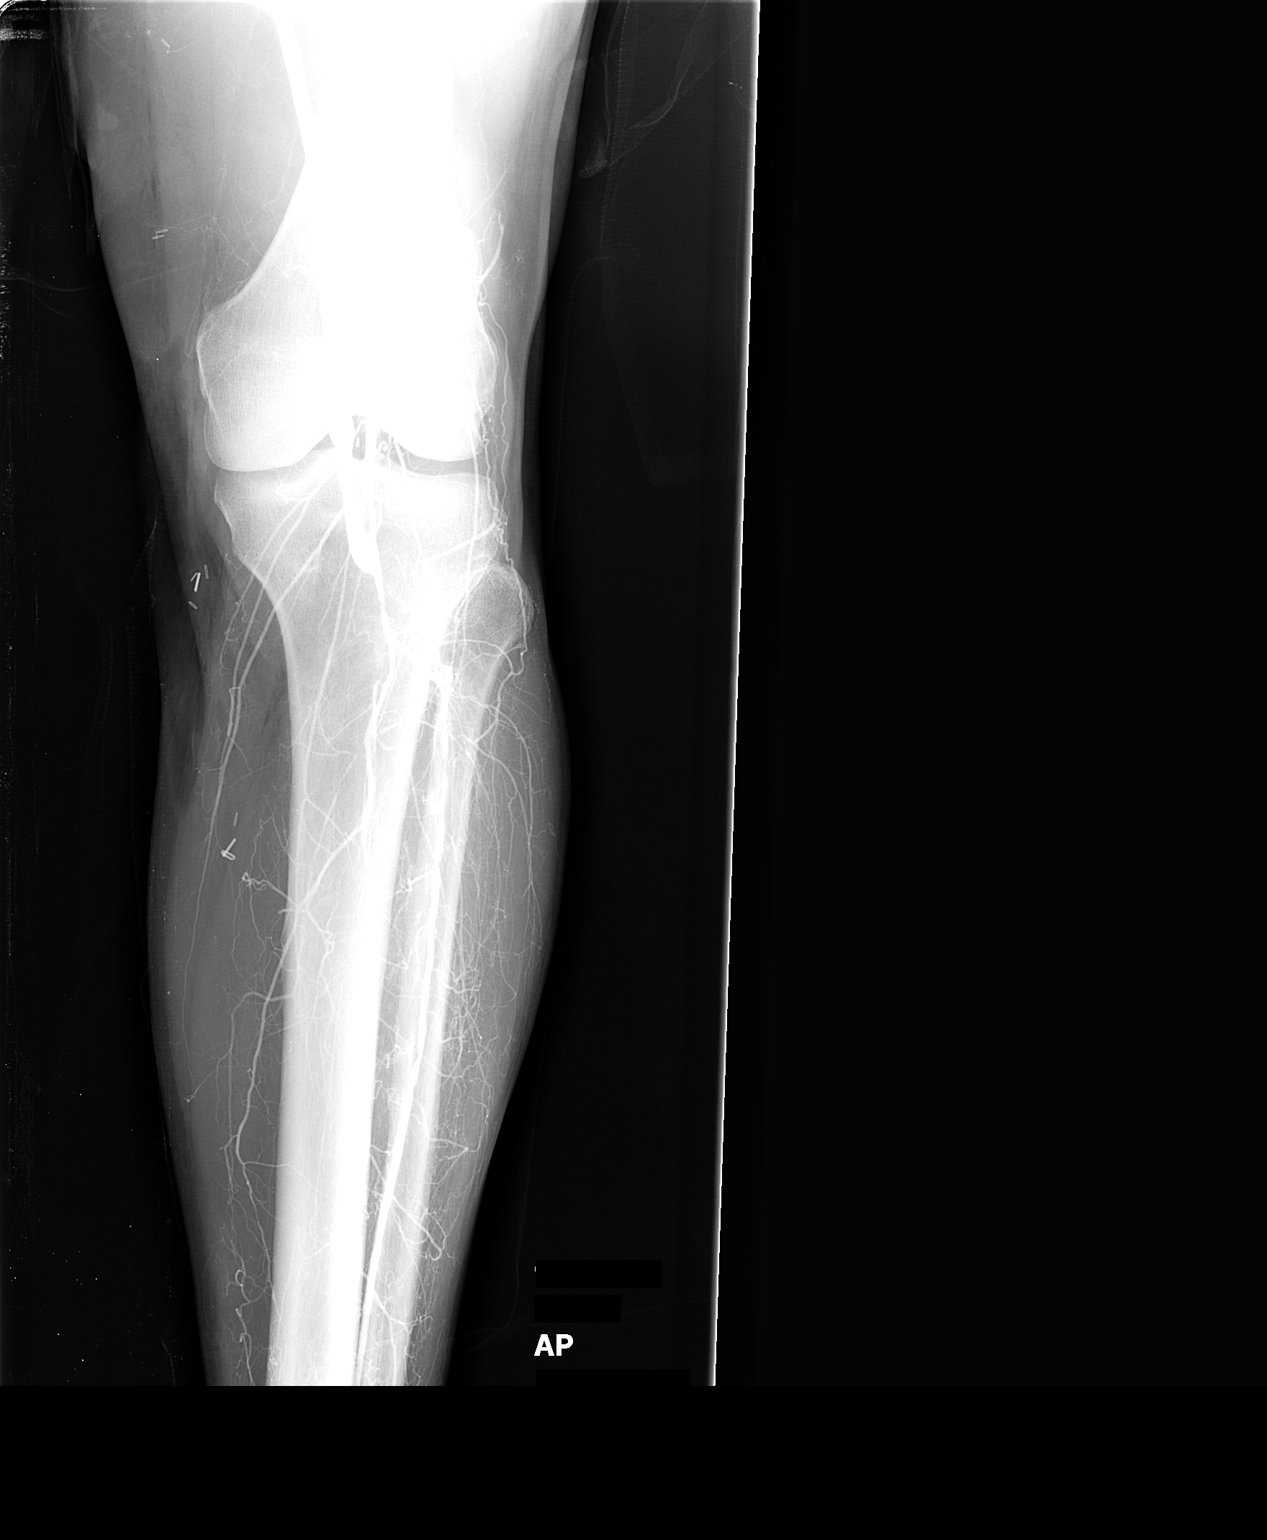

[1 of 1 positions shown; findings below may reference images not displayed]

FINDINGS: Single AP radiograph of the lower leg after the injection
of contrast is provided for review.

The distal anastomosis of a native vein bypass graft is widely
patent. No discrete intraluminal filling defects to suggest
thrombus. No extravasation.  Two-vessel runoff to the distal lower
leg, likely via the anterior tibial and peroneal arteries.

There is expected subcutaneous emphysema and stranding along the
medial aspect of the left leg with associated surgical clips.
IMPRESSION: 1.  Left leg fem-pop bypass graft is widely patent.

2.  Two-vessel runoff to the left lower leg.

## 2012-11-16 ENCOUNTER — Encounter: Payer: Self-pay | Admitting: Vascular Surgery

## 2012-11-17 ENCOUNTER — Ambulatory Visit: Payer: Medicare Other | Admitting: Vascular Surgery

## 2012-12-21 ENCOUNTER — Emergency Department (HOSPITAL_COMMUNITY): Payer: Medicare Other

## 2012-12-21 ENCOUNTER — Encounter (HOSPITAL_COMMUNITY): Payer: Self-pay | Admitting: *Deleted

## 2012-12-21 ENCOUNTER — Emergency Department (HOSPITAL_COMMUNITY)
Admission: EM | Admit: 2012-12-21 | Discharge: 2012-12-21 | Disposition: A | Discharge status: Home / Self Care | Payer: Medicare Other | Attending: Emergency Medicine | Admitting: Emergency Medicine

## 2012-12-21 DIAGNOSIS — R7989 Other specified abnormal findings of blood chemistry: Secondary | ICD-10-CM

## 2012-12-21 DIAGNOSIS — S46912A Strain of unspecified muscle, fascia and tendon at shoulder and upper arm level, left arm, initial encounter: Secondary | ICD-10-CM

## 2012-12-21 DIAGNOSIS — N189 Chronic kidney disease, unspecified: Secondary | ICD-10-CM | POA: Insufficient documentation

## 2012-12-21 DIAGNOSIS — Y9389 Activity, other specified: Secondary | ICD-10-CM | POA: Insufficient documentation

## 2012-12-21 DIAGNOSIS — Z872 Personal history of diseases of the skin and subcutaneous tissue: Secondary | ICD-10-CM | POA: Insufficient documentation

## 2012-12-21 DIAGNOSIS — D649 Anemia, unspecified: Secondary | ICD-10-CM | POA: Insufficient documentation

## 2012-12-21 DIAGNOSIS — I2510 Atherosclerotic heart disease of native coronary artery without angina pectoris: Secondary | ICD-10-CM | POA: Insufficient documentation

## 2012-12-21 DIAGNOSIS — Z7901 Long term (current) use of anticoagulants: Secondary | ICD-10-CM | POA: Insufficient documentation

## 2012-12-21 DIAGNOSIS — Z87891 Personal history of nicotine dependence: Secondary | ICD-10-CM | POA: Insufficient documentation

## 2012-12-21 DIAGNOSIS — E785 Hyperlipidemia, unspecified: Secondary | ICD-10-CM | POA: Insufficient documentation

## 2012-12-21 DIAGNOSIS — Y92410 Unspecified street and highway as the place of occurrence of the external cause: Secondary | ICD-10-CM | POA: Insufficient documentation

## 2012-12-21 DIAGNOSIS — Z951 Presence of aortocoronary bypass graft: Secondary | ICD-10-CM | POA: Insufficient documentation

## 2012-12-21 DIAGNOSIS — R739 Hyperglycemia, unspecified: Secondary | ICD-10-CM

## 2012-12-21 DIAGNOSIS — R748 Abnormal levels of other serum enzymes: Secondary | ICD-10-CM | POA: Insufficient documentation

## 2012-12-21 DIAGNOSIS — I509 Heart failure, unspecified: Secondary | ICD-10-CM | POA: Insufficient documentation

## 2012-12-21 DIAGNOSIS — E1169 Type 2 diabetes mellitus with other specified complication: Secondary | ICD-10-CM | POA: Insufficient documentation

## 2012-12-21 DIAGNOSIS — Z794 Long term (current) use of insulin: Secondary | ICD-10-CM | POA: Insufficient documentation

## 2012-12-21 DIAGNOSIS — Z79899 Other long term (current) drug therapy: Secondary | ICD-10-CM | POA: Insufficient documentation

## 2012-12-21 DIAGNOSIS — I129 Hypertensive chronic kidney disease with stage 1 through stage 4 chronic kidney disease, or unspecified chronic kidney disease: Secondary | ICD-10-CM | POA: Insufficient documentation

## 2012-12-21 DIAGNOSIS — IMO0002 Reserved for concepts with insufficient information to code with codable children: Secondary | ICD-10-CM | POA: Insufficient documentation

## 2012-12-21 DIAGNOSIS — Z86718 Personal history of other venous thrombosis and embolism: Secondary | ICD-10-CM | POA: Insufficient documentation

## 2012-12-21 DIAGNOSIS — Z8679 Personal history of other diseases of the circulatory system: Secondary | ICD-10-CM | POA: Insufficient documentation

## 2012-12-21 LAB — POCT I-STAT, CHEM 8
BUN: 41 mg/dL — ABNORMAL HIGH (ref 6–23)
Calcium, Ion: 1.09 mmol/L — ABNORMAL LOW (ref 1.13–1.30)
Glucose, Bld: 424 mg/dL — ABNORMAL HIGH (ref 70–99)
TCO2: 26 mmol/L (ref 0–100)

## 2012-12-21 LAB — GLUCOSE, CAPILLARY: Glucose-Capillary: 374 mg/dL — ABNORMAL HIGH (ref 70–99)

## 2012-12-21 LAB — PROTIME-INR: INR: 2.08 — ABNORMAL HIGH (ref 0.00–1.49)

## 2012-12-21 LAB — POCT I-STAT TROPONIN I: Troponin i, poc: 0.04 ng/mL (ref 0.00–0.08)

## 2012-12-21 MED ORDER — IBUPROFEN 200 MG PO TABS
400.0000 mg | ORAL_TABLET | Freq: Once | ORAL | Status: AC
  Administered 2012-12-21: 400 mg via ORAL
  Filled 2012-12-21: qty 2

## 2012-12-21 NOTE — ED Provider Notes (Signed)
Medical screening examination/treatment/procedure(s) were performed by non-physician practitioner and as supervising physician I was immediately available for consultation/collaboration.  Anthony T Allen, MD 12/21/12 2327 

## 2012-12-21 NOTE — ED Notes (Signed)
Patient transported to X-ray 

## 2012-12-21 NOTE — ED Notes (Signed)
Pt given discharge paperwork; pt verbalized understanding of d/c; no additional questions by pt regarding d/c; pt's BP elevated at 202/64; MD made aware; pt asymptomatic not c/o dizziness, blurred vision, headache; resps e/u; ambulatory on discharge; e-signature obtained;
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
## 2013-01-04 ENCOUNTER — Other Ambulatory Visit (HOSPITAL_COMMUNITY): Payer: Self-pay | Admitting: Internal Medicine

## 2013-01-04 DIAGNOSIS — R9431 Abnormal electrocardiogram [ECG] [EKG]: Secondary | ICD-10-CM

## 2013-01-04 DIAGNOSIS — Z951 Presence of aortocoronary bypass graft: Secondary | ICD-10-CM

## 2013-01-04 DIAGNOSIS — R0600 Dyspnea, unspecified: Secondary | ICD-10-CM

## 2013-01-13 ENCOUNTER — Ambulatory Visit (HOSPITAL_COMMUNITY)
Admission: RE | Admit: 2013-01-13 | Discharge: 2013-01-13 | Disposition: A | Discharge status: Home / Self Care | Payer: Medicare Other | Source: Ambulatory Visit | Attending: Internal Medicine | Admitting: Internal Medicine

## 2013-01-13 DIAGNOSIS — R42 Dizziness and giddiness: Secondary | ICD-10-CM | POA: Insufficient documentation

## 2013-01-13 DIAGNOSIS — R9431 Abnormal electrocardiogram [ECG] [EKG]: Secondary | ICD-10-CM

## 2013-01-13 DIAGNOSIS — Z951 Presence of aortocoronary bypass graft: Secondary | ICD-10-CM

## 2013-01-13 DIAGNOSIS — Z794 Long term (current) use of insulin: Secondary | ICD-10-CM | POA: Insufficient documentation

## 2013-01-13 DIAGNOSIS — E109 Type 1 diabetes mellitus without complications: Secondary | ICD-10-CM | POA: Insufficient documentation

## 2013-01-13 DIAGNOSIS — R0609 Other forms of dyspnea: Secondary | ICD-10-CM | POA: Insufficient documentation

## 2013-01-13 DIAGNOSIS — R002 Palpitations: Secondary | ICD-10-CM | POA: Insufficient documentation

## 2013-01-13 DIAGNOSIS — I509 Heart failure, unspecified: Secondary | ICD-10-CM | POA: Insufficient documentation

## 2013-01-13 DIAGNOSIS — R0989 Other specified symptoms and signs involving the circulatory and respiratory systems: Secondary | ICD-10-CM | POA: Insufficient documentation

## 2013-01-13 DIAGNOSIS — I10 Essential (primary) hypertension: Secondary | ICD-10-CM | POA: Insufficient documentation

## 2013-01-13 DIAGNOSIS — R0602 Shortness of breath: Secondary | ICD-10-CM | POA: Insufficient documentation

## 2013-01-13 DIAGNOSIS — R0600 Dyspnea, unspecified: Secondary | ICD-10-CM

## 2013-01-13 DIAGNOSIS — R079 Chest pain, unspecified: Secondary | ICD-10-CM | POA: Insufficient documentation

## 2013-01-13 HISTORY — PX: NM MYOVIEW LTD: HXRAD82

## 2013-01-13 MED ORDER — TECHNETIUM TC 99M SESTAMIBI GENERIC - CARDIOLITE
11.0000 | Freq: Once | INTRAVENOUS | Status: AC | PRN
  Administered 2013-01-13: 11 via INTRAVENOUS

## 2013-01-13 MED ORDER — TECHNETIUM TC 99M SESTAMIBI GENERIC - CARDIOLITE
30.6000 | Freq: Once | INTRAVENOUS | Status: AC | PRN
  Administered 2013-01-13: 31 via INTRAVENOUS

## 2013-01-13 MED ORDER — REGADENOSON 0.4 MG/5ML IV SOLN
0.4000 mg | Freq: Once | INTRAVENOUS | Status: AC
Start: 2013-01-13 — End: 2013-01-13
  Administered 2013-01-13: 0.4 mg via INTRAVENOUS

## 2013-01-13 NOTE — Procedures (Addendum)
[place] [facility] [address] [place] [address] [phone]  Cardiology Nuclear Med Study  [name] is a 72 y.o. female     MRN : [id number]     DOB: 07/03/1941  Procedure Date: 01/13/2013  Nuclear Med Background Indication for Stress Test:  Graft Patency, PTCA Patency and Abnormal EKG History:  COPD and CAD;MI;CABG X5-04/25/2008;PTCA-04/22/2008 Cardiac Risk Factors: Family History - CAD, History of Smoking, Hypertension, IDDM Type 1, Overweight, PVD, RBBB and CHF;DVT  Symptoms:  Chest Pain, DOE, Light-Headedness, Palpitations and SOB   Nuclear Pre-Procedure Caffeine/Decaff Intake:  9:00pm NPO After: 7:00am   IV Site: R Antecubital  IV 0.9% NS with Angio Cath:  22g  Chest Size (in):  N/A IV Started by: Emmit Pomfret, RN  Height: 5\' 3"  (1.6 m)  Cup Size: B  BMI:  Body mass index is 29.06 kg/(m^2). Weight:  164 lb (74.39 kg)   Tech Comments:  N/A    Nuclear Med Study 1 or 2 day study: 1 day  Stress Test Type:  Lexiscan  Order Authorizing Provider:  Zoila Shutter, MD   Resting Radionuclide: Technetium 72m Sestamibi  Resting Radionuclide Dose: 11.0 mCi   Stress Radionuclide:  Technetium 21m Sestamibi  Stress Radionuclide Dose: 30.6 mCi           Stress Protocol Rest HR: 73 Stress HR: 83  Rest BP: 207/82 Stress BP: 187/71  Exercise Time (min): n/a METS: n/a   Predicted Max HR: 150 bpm % Max HR: 55.33 bpm Rate Pressure Product: 86578  Dose of Adenosine (mg):  n/a Dose of Lexiscan: 0.4 mg  Dose of Atropine (mg): n/a Dose of Dobutamine: n/a mcg/kg/min (at max HR)  Stress Test Technologist: Esperanza Sheets, CCT Nuclear Technologist: Gonzella Lex, CNMT   Rest Procedure:  Myocardial perfusion imaging was performed at rest 45 minutes following the intravenous administration of Technetium 74m Sestamibi. Stress Procedure:  The patient received IV Lexiscan 0.4 mg over 15-seconds.  Technetium 42m Sestamibi injected at  30-seconds.  There were no significant changes with Lexiscan.  Quantitative spect images were obtained after a 45 minute delay.  Transient Ischemic Dilatation (Normal <1.22):  1.07 Lung/Heart Ratio (Normal <0.45):  0.30 QGS EDV:  147 ml QGS ESV:  82 ml LV Ejection Fraction: 44%  Rest ECG: NSR with RBBB, inferior and lateral Q waves  Stress ECG: No significant change from baseline ECG  QPS Raw Data Images:  Normal; no motion artifact; normal heart/lung ratio. Stress Images:  There are defects in the inferior and mid to distal anteroapical walls. Rest Images:  Fixed inferior defect and paritally reversible anteroapical defect. Subtraction (SDS):  5. Fixed inferior defect and partially reversible anteroapical defects.  Impression Exercise Capacity:  Lexiscan with no exercise. BP Response:  Normal blood pressure response. Clinical Symptoms:  No significant symptoms noted. ECG Impression:  There are scattered PVCs. Comparison with Prior Nuclear Study: No previous nuclear study performed  Overall Impression:  High risk stress nuclear study. There is a fixed inferior defect consistent with scar. There is partial reversibility of the mid to distal anterior wall and apex consistent with ischemia. Cardiac catheterization is recommended.  LV Wall Motion:  LVEF 44%, inferior akinesis, anteroapical hypokinesis.  Chrystie Nose, MD, [facility] Board Certified in Nuclear Cardiology Attending Cardiologist [facility]  Chrystie Nose, MD  01/13/2013 5:18 PM

## 2013-01-22 ENCOUNTER — Ambulatory Visit
Admission: RE | Admit: 2013-01-22 | Discharge: 2013-01-22 | Disposition: A | Discharge status: Home / Self Care | Payer: Medicare Other | Source: Ambulatory Visit | Attending: Internal Medicine | Admitting: Internal Medicine

## 2013-01-22 ENCOUNTER — Other Ambulatory Visit: Payer: Self-pay | Admitting: Internal Medicine

## 2013-01-22 DIAGNOSIS — R0602 Shortness of breath: Secondary | ICD-10-CM

## 2013-01-26 ENCOUNTER — Other Ambulatory Visit: Payer: Self-pay | Admitting: Internal Medicine

## 2013-01-26 DIAGNOSIS — R911 Solitary pulmonary nodule: Secondary | ICD-10-CM

## 2013-01-27 ENCOUNTER — Ambulatory Visit (HOSPITAL_COMMUNITY): Admission: RE | Admit: 2013-01-27 | Payer: Medicare Other | Source: Ambulatory Visit | Admitting: Internal Medicine

## 2013-01-27 ENCOUNTER — Encounter (HOSPITAL_COMMUNITY): Admission: RE | Payer: Self-pay | Source: Ambulatory Visit

## 2013-01-27 SURGERY — LEFT HEART CATHETERIZATION WITH CORONARY/GRAFT ANGIOGRAM
Anesthesia: LOCAL

## 2013-01-29 ENCOUNTER — Emergency Department (HOSPITAL_COMMUNITY): Payer: Medicare Other

## 2013-01-29 ENCOUNTER — Emergency Department (HOSPITAL_COMMUNITY)
Admission: EM | Admit: 2013-01-29 | Discharge: 2013-01-29 | Disposition: A | Discharge status: Home / Self Care | Payer: Medicare Other | Attending: Emergency Medicine | Admitting: Emergency Medicine

## 2013-01-29 ENCOUNTER — Encounter (HOSPITAL_COMMUNITY): Payer: Self-pay | Admitting: Emergency Medicine

## 2013-01-29 DIAGNOSIS — Z79899 Other long term (current) drug therapy: Secondary | ICD-10-CM | POA: Insufficient documentation

## 2013-01-29 DIAGNOSIS — E1169 Type 2 diabetes mellitus with other specified complication: Secondary | ICD-10-CM | POA: Insufficient documentation

## 2013-01-29 DIAGNOSIS — N189 Chronic kidney disease, unspecified: Secondary | ICD-10-CM | POA: Insufficient documentation

## 2013-01-29 DIAGNOSIS — D649 Anemia, unspecified: Secondary | ICD-10-CM | POA: Insufficient documentation

## 2013-01-29 DIAGNOSIS — Z7902 Long term (current) use of antithrombotics/antiplatelets: Secondary | ICD-10-CM | POA: Insufficient documentation

## 2013-01-29 DIAGNOSIS — I509 Heart failure, unspecified: Secondary | ICD-10-CM | POA: Insufficient documentation

## 2013-01-29 DIAGNOSIS — R040 Epistaxis: Secondary | ICD-10-CM

## 2013-01-29 DIAGNOSIS — E785 Hyperlipidemia, unspecified: Secondary | ICD-10-CM | POA: Insufficient documentation

## 2013-01-29 DIAGNOSIS — Z86718 Personal history of other venous thrombosis and embolism: Secondary | ICD-10-CM | POA: Insufficient documentation

## 2013-01-29 DIAGNOSIS — Z8701 Personal history of pneumonia (recurrent): Secondary | ICD-10-CM | POA: Insufficient documentation

## 2013-01-29 DIAGNOSIS — Z87891 Personal history of nicotine dependence: Secondary | ICD-10-CM | POA: Insufficient documentation

## 2013-01-29 DIAGNOSIS — Z794 Long term (current) use of insulin: Secondary | ICD-10-CM | POA: Insufficient documentation

## 2013-01-29 DIAGNOSIS — Z86711 Personal history of pulmonary embolism: Secondary | ICD-10-CM | POA: Insufficient documentation

## 2013-01-29 DIAGNOSIS — I2510 Atherosclerotic heart disease of native coronary artery without angina pectoris: Secondary | ICD-10-CM | POA: Insufficient documentation

## 2013-01-29 DIAGNOSIS — L97509 Non-pressure chronic ulcer of other part of unspecified foot with unspecified severity: Secondary | ICD-10-CM | POA: Insufficient documentation

## 2013-01-29 DIAGNOSIS — Z7901 Long term (current) use of anticoagulants: Secondary | ICD-10-CM | POA: Insufficient documentation

## 2013-01-29 DIAGNOSIS — I129 Hypertensive chronic kidney disease with stage 1 through stage 4 chronic kidney disease, or unspecified chronic kidney disease: Secondary | ICD-10-CM | POA: Insufficient documentation

## 2013-01-29 DIAGNOSIS — Z951 Presence of aortocoronary bypass graft: Secondary | ICD-10-CM | POA: Insufficient documentation

## 2013-01-29 LAB — PROTIME-INR
INR: 2.95 — ABNORMAL HIGH (ref 0.00–1.49)
Prothrombin Time: 29.2 seconds — ABNORMAL HIGH (ref 11.6–15.2)

## 2013-01-29 LAB — POCT I-STAT, CHEM 8
BUN: 31 mg/dL — ABNORMAL HIGH (ref 6–23)
Calcium, Ion: 1.13 mmol/L (ref 1.13–1.30)
Chloride: 107 mEq/L (ref 96–112)
Glucose, Bld: 194 mg/dL — ABNORMAL HIGH (ref 70–99)
TCO2: 24 mmol/L (ref 0–100)

## 2013-01-29 NOTE — ED Notes (Addendum)
PT reports that she was dx with pneumonia from Dr. Cristine Polio and given px for antibiotics. Will finish levofloxacin 500 mg today. Pt reports nose bleed at 0400; stopped bleeding in less than an hour. Has hx of recurrent nosebleeds.  Pt HTN and last week BP medicine was increased.

## 2013-01-29 NOTE — ED Notes (Signed)
PT ambulated with baseline gait; VSS; A&Ox3; no signs of distress; respirations even and unlabored; skin warm and dry; no questions upon discharge.  

## 2013-01-29 NOTE — ED Provider Notes (Signed)
Medical screening examination/treatment/procedure(s) were conducted as a shared visit with non-physician practitioner(s) and myself.  I personally evaluated the patient during the encounter   Loren Racer, MD 01/29/13 1511

## 2013-01-29 NOTE — ED Provider Notes (Signed)
History     CSN: [id number]  Arrival date & time 01/29/13  4098   First MD Initiated Contact with Patient 01/29/13 [phone]      Chief Complaint  Patient presents with  . Epistaxis    (Consider location/radiation/quality/duration/timing/severity/associated sxs/prior treatment) The history is provided by the patient and medical records.    72 year old female with a past medical history of diabetes, hypertension, and arterial disease presents the emergency department chief complaint of epistaxis.  Patient states she woke this morning at 4 AM with a nosebleed.  The patient states she has had several nosebleeds in her life which have all resolved.  The patient is on Plavix and Coumadin. .  Patient's daughter is in the him room with her and states she was also recently diagnosed with community-acquired pneumonia.  She has one pill of her Levaquin left.  She has no complaints of shortness of breath, chest pain, cough, or fever.  Patient was also recently seen by Dr. Rennis Golden.  She was given a nuclear stress test and has recommendation for catheterization of her heart pending resolution of her pneumonia.  Previous chest x-ray also showed an 8.3 mm nodule in the right upper lobe of her lung.  Patient is requesting a repeat chest x-ray to evaluate.   Denies fevers, chills, myalgias, arthralgias. Denies DOE, SOB, chest tightness or pressure, radiation to left arm, jaw or back, or diaphoresis. Denies dysuria, flank pain, suprapubic pain, frequency, urgency, or hematuria. Denies headaches, light headedness, weakness, visual disturbances. Denies abdominal pain, nausea, vomiting, diarrhea or constipation.   Past Medical History  Diagnosis Date  . CAD (coronary artery disease)   . Hypertension   . Diabetes mellitus     type 2 insulin dependent  . Hyperlipidemia   . CHF (congestive heart failure)   . Cellulitis     left foot  . Ulcer     diabetic ulcer on left foot  . DVT (deep venous thrombosis)    peroneal vein  . Peripheral vascular disease   . Chronic kidney disease   . Anemia     Past Surgical History  Procedure Laterality Date  . Coronary artery bypass graft    . Tubal ligation    . Pr vein bypass graft,aorto-fem-pop  06-26-11    Left Common femoral to BK popliteal BPG    Family History  Problem Relation Age of Onset  . Cancer Mother     Kidney  . Kidney disease Mother   . Cancer Father     skin    History  Substance Use Topics  . Smoking status: Former Smoker -- 2.00 packs/day for 20 years    Types: Cigarettes    Quit date: 07/15/1996  . Smokeless tobacco: Former Neurosurgeon    Types: Snuff  . Alcohol Use: No    OB History   Grav Para Term Preterm Abortions TAB SAB Ect Mult Living                  Review of Systems Ten systems reviewed and are negative for acute change, except as noted in the HPI. \ Allergies  Aspirin  Home Medications   Current Outpatient Rx  Name  Route  Sig  Dispense  Refill  . amLODipine (NORVASC) 5 MG tablet   Oral   Take 5 mg by mouth daily.           [name] atorvastatin (LIPITOR) 40 MG tablet   Oral   Take 40 mg by mouth daily.           [name]  carvedilol (COREG) 12.5 MG tablet   Oral   Take 12.5 mg by mouth 2 (two) times daily with a meal.           . clopidogrel (PLAVIX) 75 MG tablet   Oral   Take 75 mg by mouth daily.           . digoxin (LANOXIN) 0.125 MG tablet   Oral   Take 125 mcg by mouth daily.           . ferrous sulfate 325 (65 FE) MG tablet   Oral   Take 325 mg by mouth 2 (two) times daily.           . folic acid (FOLVITE) 1 MG tablet   Oral   Take 1 mg by mouth daily.           . insulin aspart (NOVOLOG) 100 UNIT/ML injection   Subcutaneous   Inject 20-26 Units into the skin 3 (three) times daily before meals. Take 20 units at breakfast and lunch. Take 26 units at dinner         . insulin glargine (LANTUS) 100 UNIT/ML injection   Subcutaneous   Inject 50 Units into the skin 2 (two) times  daily.          [name] lisinopril (PRINIVIL,ZESTRIL) 40 MG tablet   Oral   Take 40 mg by mouth daily.           . niacin (NIASPAN) 500 MG CR tablet   Oral   Take 500 mg by mouth at bedtime.           [name] omega-3 acid ethyl esters (LOVAZA) 1 G capsule   Oral   Take 1 g by mouth daily.          [name] omeprazole (PRILOSEC) 20 MG capsule   Oral   Take 20 mg by mouth daily.           . polyethylene glycol (MIRALAX / GLYCOLAX) packet   Oral   Take 17 g by mouth daily.           [name] warfarin (COUMADIN) 4 MG tablet   Oral   Take 4-6 mg by mouth. TAKES 1 TABLET (4 MG) MON, WEDS, FRI & SUN.  TAKES 1.5 TABLET (6 MG) TUES, THURS, SAT.           BP 202/61  Pulse 59  Temp(Src) 97.7 F (36.5 C) (Oral)  Resp 19  SpO2 100%  Physical Exam  Constitutional: She is oriented to person, place, and time. She appears well-developed and well-nourished. No distress.  HENT:  Head: Normocephalic and atraumatic.  Nose: No mucosal edema. Epistaxis is observed.    Eyes: Conjunctivae are normal. No scleral icterus.  Neck: Normal range of motion.  Cardiovascular: Normal rate, regular rhythm and normal heart sounds.  Exam reveals no gallop and no friction rub.   No murmur heard. Pulmonary/Chest: Effort normal and breath sounds normal. No respiratory distress.  Abdominal: Soft. Bowel sounds are normal. She exhibits no distension and no mass. There is no tenderness. There is no guarding.  Neurological: She is alert and oriented to person, place, and time.  Skin: Skin is warm and dry. She is not diaphoretic.    ED Course  Procedures (including critical care time)  Labs Reviewed  PROTIME-INR   No results found.   No diagnosis found.    MDM  10:03 AM BP 185/59  Pulse 57  Temp(Src) 97.7 F (36.5 C) (Oral)  Resp 12  SpO2 99% Patient with some mild hypertension.  She did take her medication this morning.  Epistaxis has resolved.  Will repeat chest x-ray, check PT INR and Chem-8.. VSS.  No other complaints at this time.    11:21 AM] INR threapeutic. No CAP on cxr.  Patient seen in shared visit with Dr. Gilmer Mor.  WIll d/c and have patient call for follow up with CT chest and cardiac cath. The patient appears reasonably screened and/or stabilized for discharge and I doubt any other medical condition or other [facility] requiring further screening, evaluation, or treatment in the ED at this time prior to discharge. Patient expresses understanding and agrees with plan.    Arthor Captain, PA-C 01/29/13 1131

## 2013-02-17 ENCOUNTER — Ambulatory Visit
Admission: RE | Admit: 2013-02-17 | Discharge: 2013-02-17 | Disposition: A | Discharge status: Home / Self Care | Payer: Medicare Other | Source: Ambulatory Visit | Attending: Internal Medicine | Admitting: Internal Medicine

## 2013-02-17 DIAGNOSIS — R911 Solitary pulmonary nodule: Secondary | ICD-10-CM

## 2013-02-25 ENCOUNTER — Telehealth: Payer: Self-pay | Admitting: Internal Medicine

## 2013-02-25 NOTE — Telephone Encounter (Signed)
[name] called saying that someone called her to see how did she do last night with her machine.  She said she did have a problem with the machine where the mask goes over her nose and mouth and do the holes on the mask suppose to be close and if so please tell her how to close them. [name] is not sure of the name that was left on her answering machine . Please call at [phone]

## 2013-02-26 NOTE — Telephone Encounter (Signed)
Returned call.  Pt stated she received a call form Dr. Ephriam Knuckles office today.  Pt. Informed the call from yesterday did not come from our office and verbalized understanding.

## 2013-03-01 ENCOUNTER — Encounter: Payer: Self-pay | Admitting: *Deleted

## 2013-03-01 ENCOUNTER — Encounter: Payer: Self-pay | Admitting: Internal Medicine

## 2013-03-02 ENCOUNTER — Encounter: Payer: Self-pay | Admitting: Internal Medicine

## 2013-03-02 ENCOUNTER — Ambulatory Visit (INDEPENDENT_AMBULATORY_CARE_PROVIDER_SITE_OTHER): Payer: Medicare Other | Admitting: Internal Medicine

## 2013-03-02 ENCOUNTER — Telehealth: Payer: Self-pay | Admitting: Internal Medicine

## 2013-03-02 VITALS — BP 160/60 | HR 60 | Ht 63.0 in | Wt 157.4 lb

## 2013-03-02 DIAGNOSIS — Z9989 Dependence on other enabling machines and devices: Secondary | ICD-10-CM | POA: Insufficient documentation

## 2013-03-02 DIAGNOSIS — I5022 Chronic systolic (congestive) heart failure: Secondary | ICD-10-CM

## 2013-03-02 DIAGNOSIS — G4733 Obstructive sleep apnea (adult) (pediatric): Secondary | ICD-10-CM | POA: Insufficient documentation

## 2013-03-02 DIAGNOSIS — R9439 Abnormal result of other cardiovascular function study: Secondary | ICD-10-CM

## 2013-03-02 DIAGNOSIS — J449 Chronic obstructive pulmonary disease, unspecified: Secondary | ICD-10-CM

## 2013-03-02 DIAGNOSIS — Z8709 Personal history of other diseases of the respiratory system: Secondary | ICD-10-CM

## 2013-03-02 DIAGNOSIS — Z87898 Personal history of other specified conditions: Secondary | ICD-10-CM | POA: Insufficient documentation

## 2013-03-02 DIAGNOSIS — N184 Chronic kidney disease, stage 4 (severe): Secondary | ICD-10-CM

## 2013-03-02 DIAGNOSIS — Z951 Presence of aortocoronary bypass graft: Secondary | ICD-10-CM

## 2013-03-02 MED ORDER — ISOSORBIDE MONONITRATE ER 30 MG PO TB24: 15.0000 mg | ORAL_TABLET | Freq: Every day | ORAL | Status: DC

## 2013-03-02 NOTE — Telephone Encounter (Signed)
[name] thinks she gave the wrong pharmacy information, please send refill to [facility] nd not [facility]..[phone]. It was her Kidney Medication .[name] She is waiting at the pharmacy for her prescription  Thanks

## 2013-03-02 NOTE — Patient Instructions (Signed)
You have been referred to DR Zetta Bills for your renal dysfunction Your physician has recommended you make the following change in your medication: start Isosorbide 30 mg, take 1/2 tablet(15 mg) by mouth daily Your physician recommends that you schedule a follow-up appointment in: in 6 months

## 2013-03-02 NOTE — Progress Notes (Signed)
OFFICE NOTE  Chief Complaint:  Dyspnea on exertion, return office visit  Primary Care Physician: Michiel Sites, MD  HPI:  [name] is a 72 year old female with a history of CABG in 2009 x 5 vessls, dyslipidemia, hypertension, and diabetes. Last summer, she had been having some increasing shortness of breath and underwent a MET-TEST, which was a poor effort, but her VO2 was markedly reduced at 29%. It is difficult, therefore, to interpret the test; however, the reduced VO2 is concerning. There was a slight reduction in diffusion capacity in her PFTs and some mild obstruction, and I think COPD probably plays a role in things. Recently, though, she has been more short of breath with elevated heart rate. She has had pressure in her chest and difficulty when doing activities. She underwent a stress test in our office to further characterize this, which was a [place] nuclear stress test, performed on January 13, 2013. This was interpreted as a high-risk study. There were scattered PVCs during the study. EF was 44% with inferior akinesis and anteroapical hypokinesis. There was a reversible defect in the mid and distal anterior wall and apex consistent with ischemia, and a fixed defect inferiorly consistent with scar. Based on these findings, cardiac catheterization was recommended. Standard preoperative testing however revealed anemia, and marked chronic kidney disease which was unknown. She also had an abnormality on her chest x-ray concerning for possible pneumonia or mass. Catheterization was then canceled and prior to seeing her back in the office, she presented to the emergency department with an episode of epistaxis which is easily controlled. She subsequently underwent a CT scan of the chest which reveals chronic scarring and perhaps a reactive pleural-based nodule. She is multivessel disease which is known and is status post CABG. Today she reports feeling fairly well although it has several  episodes of shortness of breath with some left arm heaviness improved with nitroglycerin. This could represent coronary ischemia, however given her severe chronic kidney disease catheterization is not recommended as a there is a high likelihood of progressing toward dialysis. It should be noted that she is not currently seeing a nephrologist.  PMHx:  Past Medical History  Diagnosis Date  . CAD (coronary artery disease)   . Hypertension   . Diabetes mellitus     type 2 insulin dependent  . Hyperlipidemia   . CHF (congestive heart failure)   . Cellulitis     left foot  . Ulcer     diabetic ulcer on left foot  . DVT (deep venous thrombosis)     peroneal vein  . Peripheral vascular disease   . Chronic kidney disease   . Anemia     Past Surgical History  Procedure Laterality Date  . Tubal ligation    . Pr vein bypass graft,aorto-fem-pop  06-26-11    1. PATENT LEFT FEMORAL-POPLITEAL BYPASS GRAFT W/NO EVEIDENCE OF STENOSIS. 2.VELOCITIES OF GREATER THAN 200 CM/'s NOTED ON PREVIOUS EXAM 10/03/11 WERE NOT ADEQUATELY VISULAIZED DURING THIS EXAM  . Coronary artery bypass graft  04/25/08    Dr. Tyrone Sage, CABG x 5   . Nm myoview ltd  01/13/13    LEXISCAN; LV WALL MOTION: LVEF 44%, INFERIOR AKINESIS, ANTEROAPICAL HYPOKINESIS  . Cardiac catheterization  04/22/08    SEVERE 3-VESSEL DISEASE CAD., CABG X 5 04/25/08 WITH DR. Tyrone Sage    FAMHx:  Family History  Problem Relation Age of Onset  . Cancer Mother     Kidney  . Kidney disease Mother   .  Cancer Father     skin    SOCHx:   reports that she quit smoking about 16 years ago. Her smoking use included Cigarettes. She has a 40 pack-year smoking history. She has quit using smokeless tobacco. Her smokeless tobacco use included Snuff. She reports that she does not drink alcohol or use illicit drugs.  ALLERGIES:  Allergies  Allergen Reactions  . Ivp Dye (Iodinated Diagnostic Agents)   . Aspirin Hives    ROS: A comprehensive review of  systems was negative except for: Constitutional: positive for fatigue Respiratory: positive for dyspnea on exertion, wheezing and OSA Cardiovascular: positive for dyspnea Hematologic/lymphatic: positive for anemia  HOME MEDS: Current Outpatient Prescriptions  Medication Sig Dispense Refill  . amLODipine (NORVASC) 5 MG tablet Take 10 mg by mouth daily.       [name] atorvastatin (LIPITOR) 40 MG tablet Take 40 mg by mouth daily.        . carvedilol (COREG) 12.5 MG tablet Take 12.5 mg by mouth 2 (two) times daily with a meal.        . clopidogrel (PLAVIX) 75 MG tablet Take 75 mg by mouth daily.        . digoxin (LANOXIN) 0.125 MG tablet Take 125 mcg by mouth daily.        . ferrous sulfate 325 (65 FE) MG tablet Take 325 mg by mouth 2 (two) times daily.        . folic acid (FOLVITE) 1 MG tablet Take 1 mg by mouth daily.        . furosemide (LASIX) 40 MG tablet Take 40 mg by mouth as needed.      . insulin aspart (NOVOLOG) 100 UNIT/ML injection Inject 20-26 Units into the skin 3 (three) times daily before meals. Take 20 units at breakfast and lunch. Take 26 units at dinner      . nitroGLYCERIN (NITROSTAT) 0.4 MG SL tablet Place 0.4 mg under the tongue every 5 (five) minutes as needed for chest pain.      [name] omega-3 acid ethyl esters (LOVAZA) 1 G capsule Take 1 g by mouth daily.       [name] omeprazole (PRILOSEC) 20 MG capsule Take 20 mg by mouth daily.        . polyethylene glycol (MIRALAX / GLYCOLAX) packet Take 17 g by mouth daily.        . ramipril (ALTACE) 10 MG tablet Take 10 mg by mouth daily.      [name] senna (SENOKOT) 8.6 MG tablet Take 1 tablet by mouth at bedtime.      [name] warfarin (COUMADIN) 4 MG tablet Take 4-6 mg by mouth. TAKES 1 TABLET (4 MG) MON, WEDS, FRI & SUN.  TAKES 1.5 TABLET (6 MG) TUES, THURS, SAT.      [name] isosorbide mononitrate (IMDUR) 30 MG 24 hr tablet Take 0.5 tablets (15 mg total) by mouth daily.  90 tablet  3  . lisinopril (PRINIVIL,ZESTRIL) 40 MG tablet Take 40 mg by mouth daily.         . niacin (NIASPAN) 500 MG CR tablet Take 500 mg by mouth at bedtime.         No current facility-administered medications for this visit.    LABS/IMAGING: No results found for this or any previous visit (from the past 48 hour(s)). No results found.  VITALS: BP 160/60  Pulse 60  Ht 5\' 3"  (1.6 m)  Wt 157 lb 6.4 oz (71.396 kg)  BMI 27.89 kg/m2  EXAM:  General appearance: alert and no distress Neck: no adenopathy, no carotid bruit, no JVD, supple, symmetrical, trachea midline and thyroid not enlarged, symmetric, no tenderness/mass/nodules Lungs: wheezes Faint expiratory bilateral Heart: regular rate and rhythm, S1, S2 normal, no murmur, click, rub or gallop Abdomen: Obese, soft nontender Extremities: extremities normal, atraumatic, no cyanosis or edema Pulses: 2+ and symmetric Skin: Skin color, texture, turgor normal. No rashes or lesions Neurologic: Grossly normal  EKG: Sinus rhythm at 60 with a inferior infarct and clear Q waves in 2, 3 and aVF as well as a right bundle-branch pattern  ASSESSMENT: 1. CABG x5 vessels, with probable angina which will be medically managed 2. Ischemic cardiomyopathy, EF 44% with inferior scar and peri-infarct ischemia., [place] heart association class II symptoms. 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   Hypertension 5.   Dyslipidemia 6.   obstructive sleep apnea on CPAP 7.   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   [name] has heart failure with an inferior MI and probably occluded or near occluded graft. She is having symptoms which are concerning for angina, but do seem to be relieved with nitroglycerin. Given her marked chronic kidney disease, she is not a good candidate for cardiac catheterization due to the increased risk of contrast nephropathy. I like to refer to see a nephrologist for further evaluation of her kidneys and treatment of her anemia which is probably related to chronic kidney disease. For now will add low-dose long-acting  nitroglycerin for symptomatic relief. Plan is to see her back in 6 months with a mid-level provider and annually with myself.  Chrystie Nose, MD, [facility] Attending Cardiologist [facility]  HILTY,Kenneth C 03/02/2013, 1:38 PM

## 2013-03-30 ENCOUNTER — Encounter: Payer: Self-pay | Admitting: Cardiovascular Disease

## 2013-03-30 ENCOUNTER — Ambulatory Visit (INDEPENDENT_AMBULATORY_CARE_PROVIDER_SITE_OTHER): Payer: Medicare Other | Admitting: Cardiovascular Disease

## 2013-03-30 VITALS — BP 142/80 | HR 68 | Ht 63.0 in | Wt 157.7 lb

## 2013-03-30 DIAGNOSIS — Z951 Presence of aortocoronary bypass graft: Secondary | ICD-10-CM

## 2013-03-30 DIAGNOSIS — I10 Essential (primary) hypertension: Secondary | ICD-10-CM | POA: Insufficient documentation

## 2013-03-30 DIAGNOSIS — G4733 Obstructive sleep apnea (adult) (pediatric): Secondary | ICD-10-CM

## 2013-03-30 NOTE — Progress Notes (Signed)
Patient ID: [name], female   DOB: 1940/11/03, 72 y.o.   MRN: [id number]   HPI: [name], is a 72 y.o. female patient of Drs. [name], [name], and [name]. She presents for sleep clinic evaluation following initiation of CPAP therapy for obstructive sleep apnea.  This [name] is a 72 year old African American female who underwent CABG surgery in 2009. She also is a history of hyperlipidemia, hypertension, diabetes mellitus. For over 20+ years, she said his sleep pattern where she typically goes to bed at approximately 9 PM wakes up around midnight to 12:30 in the morning. Oftentimes she stays up for least 3 hours doing housework or some cooking and then goes back to bed at 4 AM until proximally 7 AM. This has been her pattern for most of her life. [place] However, recently, she had been noted to have loud snoring, witnessed apnea. For this reason she was referred for a diagnostic sleep study on 01/06/2013. This confirmed mild sleep apnea overall with an HI of 8.4 per hour. However, during REM sleep her sleep apnea was moderately severe with an AHI of 28.1 per hour. She has significant oxygen desaturation to a nadir of 81% with non-REM sleep and 82% with REM sleep. She had loud snoring. She did have reduced sleep efficiency.  A subsequent CPAP titration trial was done on 01/25/2013 and she was titrated up to an 8 cm water pressure with excellent response. She did have significant mouth breathing., She has been on a fullface mass or since initiating CPAP therapy, she feels that she is sleeping better. She is unaware of breakthrough snoring. However, she still has her disruptive sleep pattern. She states now she typically still goes to bed at 9 PM and wakes up at 12:30 in the morning. She stays awake for approximately 3 hours doing activities housework and cooking and then goes back to bed from 4 to 7 AM.  A recent download was obtained this confirmed that she is meeting Medicare compliance  standards. Download from May 13 through 03/30/2003 showed 100% of days with usage and she was averaging 6 hours and 35 minutes. At a CPAP fix pressure of 8 cm [place] average AHI was 3.4. She presents now for sleep clinic evaluation to  Epworth Sleepiness Scale: Situation   Chance of Dozing/Sleeping (0 = never , 1 = slight chance , 2 = moderate chance , 3 = high chance )   sitting and reading 0   watching TV 0   sitting inactive in a public place 0   being a passenger in a motor vehicle for an hour or more 0   lying down in the afternoon 1   sitting and talking to someone 0   sitting quietly after lunch (no alcohol) 0   while stopped for a few minutes in traffic as the driver 0   Total Score  1    Past Medical History  Diagnosis Date  . CAD (coronary artery disease)   . Hypertension   . Diabetes mellitus     type 2 insulin dependent  . Hyperlipidemia   . CHF (congestive heart failure)   . Cellulitis     left foot  . Ulcer     diabetic ulcer on left foot  . DVT (deep venous thrombosis)     peroneal vein  . Peripheral vascular disease   . Chronic kidney disease   . Anemia     Past Surgical History  Procedure  Laterality Date  . Tubal ligation    . Pr vein bypass graft,aorto-fem-pop  06-26-11    1. PATENT LEFT FEMORAL-POPLITEAL BYPASS GRAFT W/NO EVEIDENCE OF STENOSIS. 2.VELOCITIES OF GREATER THAN 200 CM/'s NOTED ON PREVIOUS EXAM 10/03/11 WERE NOT ADEQUATELY VISULAIZED DURING THIS EXAM  . Coronary artery bypass graft  04/25/08    Dr. Tyrone [name], CABG x 5   . Nm myoview ltd  01/13/13    LEXISCAN; LV WALL MOTION: LVEF 44%, INFERIOR AKINESIS, ANTEROAPICAL HYPOKINESIS  . Cardiac catheterization  04/22/08    SEVERE 3-VESSEL DISEASE CAD., CABG X 5 04/25/08 WITH DR. [id number]    Allergies  Allergen Reactions  . Ivp Dye (Iodinated Diagnostic Agents)   . Aspirin Hives    Current Outpatient Prescriptions  Medication Sig Dispense Refill  . amLODipine (NORVASC) 5 MG tablet Take 10 mg by  mouth daily.       [name] atorvastatin (LIPITOR) 40 MG tablet Take 40 mg by mouth daily.        . carvedilol (COREG) 12.5 MG tablet Take 12.5 mg by mouth 2 (two) times daily with a meal.        . clopidogrel (PLAVIX) 75 MG tablet Take 75 mg by mouth daily.        . digoxin (LANOXIN) 0.125 MG tablet Take 125 mcg by mouth daily.        . ferrous sulfate 325 (65 FE) MG tablet Take 325 mg by mouth 2 (two) times daily.        . folic acid (FOLVITE) 1 MG tablet Take 1 mg by mouth daily.        . furosemide (LASIX) 40 MG tablet Take 40 mg by mouth as needed.      . insulin aspart (NOVOLOG) 100 UNIT/ML injection Inject 20-26 Units into the skin 3 (three) times daily before meals. Take 20 units at breakfast and lunch. Take 26 units at dinner      . isosorbide mononitrate (IMDUR) 30 MG 24 hr tablet Take 0.5 tablets (15 mg total) by mouth daily.  90 tablet  3  . lisinopril (PRINIVIL,ZESTRIL) 40 MG tablet Take 40 mg by mouth daily.        . niacin (NIASPAN) 500 MG CR tablet Take 500 mg by mouth at bedtime.        . nitroGLYCERIN (NITROSTAT) 0.4 MG SL tablet Place 0.4 mg under the tongue every 5 (five) minutes as needed for chest pain.      [name] omega-3 acid ethyl esters (LOVAZA) 1 G capsule Take 1 g by mouth daily.       [name] omeprazole (PRILOSEC) 20 MG capsule Take 20 mg by mouth daily.        . polyethylene glycol (MIRALAX / GLYCOLAX) packet Take 17 g by mouth daily.        . ramipril (ALTACE) 10 MG tablet Take 10 mg by mouth daily.      [name] senna (SENOKOT) 8.6 MG tablet Take 1 tablet by mouth at bedtime.      [name] warfarin (COUMADIN) 4 MG tablet Take 4-6 mg by mouth. TAKES 1 TABLET (4 MG) MON, WEDS, FRI & SUN.  TAKES 1.5 TABLET (6 MG) TUES, THURS, SAT.       No current facility-administered medications for this visit.   Socially, she is married and has 5 children 8 grandchildren 4 great-grandchildren. There is no tobacco or alcohol use.  ROS is negative for fever chills night sweats. At times she does note some mild  shortness of breath and wheezing. She has been followed by Dr. Rennis Golden possibly to have COPD. She does have peripheral vascular disease. She does wear support stockings she denies chest pressure. She denies tachycardia palpitations. She denies restless legs. There is no bruxism. Denies residual daytime sleepiness. She denies neurologic symptoms. She denies recurrent anginal symptoms or other system review is negative.  PE BP 142/80  Pulse 68  Ht 5\' 3"  (1.6 m)  Wt 157 lb 11.2 oz (71.532 kg)  BMI 27.94 kg/m2  General: Alert, oriented, no distress.  HEENT: Normocephalic, atraumatic. Pupils round and reactive; sclera anicteric; Arcus senilis; Fundi mild arteriolar narrowing without hemorrhages or exudates; Nose without nasal septal hypertrophy Mouth/Parynx benign; Mallinpatti scale 2 Neck: No JVD, no carotid briuts Lungs: clear to ausculatation and percussion; no wheezing or rales Heart: RRR, s1 s2 normal 1/6 systolic murmur per Abdomen: soft, nontender; no hepatosplenomehaly, BS+; abdominal aorta nontender and not dilated by palpation. Pulses 2+ Extremities: no clubbinbg cyanosis or edema, Homan's sign negative; she was wearing support stockings. Neurologic: grossly nonfocal    LABS:  BMET    Component Value Date/Time   NA 140 01/29/2013 1003   K 4.7 01/29/2013 1003   CL 107 01/29/2013 1003   CO2 24 07/03/2011 0516   GLUCOSE 194* 01/29/2013 1003   BUN 31* 01/29/2013 1003   CREATININE 2.80* 01/29/2013 1003   CALCIUM 8.5 07/03/2011 0516   CALCIUM 8.7 07/01/2011 1831   GFRNONAA 26* 07/03/2011 0516   GFRAA 31* 07/03/2011 0516     Hepatic Function Panel     Component Value Date/Time   PROT 6.1 07/02/2011 0620   ALBUMIN 2.3* 07/03/2011 0516   AST 24 07/02/2011 0620   ALT 25 07/02/2011 0620   ALKPHOS 62 07/02/2011 0620   BILITOT 0.5 07/02/2011 0620     CBC    Component Value Date/Time   WBC 10.1 07/03/2011 0516   RBC 3.65* 07/03/2011 0516   HGB 9.2* 01/29/2013 1003   HCT 27.0* 01/29/2013  1003   PLT 351 07/03/2011 0516   MCV 83.3 07/03/2011 0516   MCH 27.7 07/03/2011 0516   MCHC 33.2 07/03/2011 0516   RDW 13.8 07/03/2011 0516   LYMPHSABS 2.9 06/21/2011 0612   MONOABS 0.5 06/21/2011 0612   EOSABS 0.4 06/21/2011 0612   BASOSABS 0.0 06/21/2011 0612     BNP    Component Value Date/Time   PROBNP 788.1* 06/18/2011 0500    Lipid Panel     Component Value Date/Time   CHOL 176 06/17/2011 0840   TRIG 454* 06/17/2011 0840   HDL 27* 06/17/2011 0840   CHOLHDL 6.5 06/17/2011 0840   VLDL UNABLE TO CALCULATE IF TRIGLYCERIDE OVER 400 mg/dL 10/19/1094 0454   LDLCALC UNABLE TO CALCULATE IF TRIGLYCERIDE OVER 400 mg/dL 0/06/8118 1478     RADIOLOGY: No results found.    ASSESSMENT AND PLAN: A long discussion with Ms. Viviani and her daughter. Assessing getting cardiovascular comorbidities including hypertension, CAD, status post CABG, diabetes as well as hyperlipidemia. Her diagnostic polysomnogram confirms mild sleep apnea overall, but moderately severe during REM sleep. She is currently meeting Medicare compliance standard with reference to both days with device usage and uses per days. Had long discussion with her discussed concerning her long-standing sleep cycle habits appear at present, she states she has been having this a pattern for well over 20 years. She does have a sleep maintenance issue Mask adjustment was made today with her [employer] air fullface mask the she does have  blood pressure lability but on repeat this was improved. She denies any nocturnal tachypalpitations. Answered all her questions regarding sleep apnea. She denies any caffeine intake at night. I did discuss with her the opportunity to consider improved sleep maintenance, but she seems to prefer her current sleep habits. She is sleeping her sleep is improved with CPAP therapy utilization. I will be available to see her on an as-needed basis from a sleep perspective.     Lennette Bihari, MD, [facility]  03/30/2013 9:52 AM

## 2013-03-30 NOTE — Patient Instructions (Signed)
Your physician recommends that you schedule a follow-up appointment as needed for sleep apnea. 

## 2013-06-18 ENCOUNTER — Other Ambulatory Visit (HOSPITAL_COMMUNITY): Payer: Self-pay | Admitting: *Deleted

## 2013-06-21 ENCOUNTER — Encounter (HOSPITAL_COMMUNITY)
Admission: RE | Admit: 2013-06-21 | Discharge: 2013-06-21 | Disposition: A | Discharge status: Home / Self Care | Payer: Medicare Other | Source: Ambulatory Visit | Attending: Nephrology | Admitting: Nephrology

## 2013-06-21 DIAGNOSIS — N184 Chronic kidney disease, stage 4 (severe): Secondary | ICD-10-CM | POA: Insufficient documentation

## 2013-06-21 DIAGNOSIS — D638 Anemia in other chronic diseases classified elsewhere: Secondary | ICD-10-CM | POA: Insufficient documentation

## 2013-06-21 MED ORDER — EPOETIN ALFA 40000 UNIT/ML IJ SOLN: 30000.0000 [IU] | INTRAMUSCULAR | Status: DC

## 2013-06-24 ENCOUNTER — Other Ambulatory Visit (HOSPITAL_COMMUNITY): Payer: Self-pay | Admitting: *Deleted

## 2013-06-25 ENCOUNTER — Encounter (HOSPITAL_COMMUNITY): Payer: Medicare Other

## 2013-07-29 ENCOUNTER — Other Ambulatory Visit: Payer: Self-pay | Admitting: *Deleted

## 2013-07-29 DIAGNOSIS — Z01810 Encounter for preprocedural cardiovascular examination: Secondary | ICD-10-CM

## 2013-07-29 DIAGNOSIS — N184 Chronic kidney disease, stage 4 (severe): Secondary | ICD-10-CM

## 2013-08-17 ENCOUNTER — Inpatient Hospital Stay (HOSPITAL_COMMUNITY)
Admission: EM | Admit: 2013-08-17 | Discharge: 2013-08-25 | DRG: 292 | Disposition: A | Discharge status: Home / Self Care | Payer: Medicare Other | Attending: Internal Medicine | Admitting: Internal Medicine

## 2013-08-17 ENCOUNTER — Encounter (HOSPITAL_COMMUNITY): Payer: Self-pay | Admitting: Emergency Medicine

## 2013-08-17 ENCOUNTER — Emergency Department (HOSPITAL_COMMUNITY): Payer: Medicare Other

## 2013-08-17 DIAGNOSIS — E1165 Type 2 diabetes mellitus with hyperglycemia: Secondary | ICD-10-CM | POA: Diagnosis present

## 2013-08-17 DIAGNOSIS — J449 Chronic obstructive pulmonary disease, unspecified: Secondary | ICD-10-CM | POA: Diagnosis present

## 2013-08-17 DIAGNOSIS — N184 Chronic kidney disease, stage 4 (severe): Secondary | ICD-10-CM | POA: Diagnosis present

## 2013-08-17 DIAGNOSIS — E785 Hyperlipidemia, unspecified: Secondary | ICD-10-CM | POA: Diagnosis present

## 2013-08-17 DIAGNOSIS — Z794 Long term (current) use of insulin: Secondary | ICD-10-CM

## 2013-08-17 DIAGNOSIS — I82509 Chronic embolism and thrombosis of unspecified deep veins of unspecified lower extremity: Secondary | ICD-10-CM | POA: Diagnosis present

## 2013-08-17 DIAGNOSIS — I5022 Chronic systolic (congestive) heart failure: Secondary | ICD-10-CM

## 2013-08-17 DIAGNOSIS — Z23 Encounter for immunization: Secondary | ICD-10-CM

## 2013-08-17 DIAGNOSIS — Z7901 Long term (current) use of anticoagulants: Secondary | ICD-10-CM

## 2013-08-17 DIAGNOSIS — D509 Iron deficiency anemia, unspecified: Secondary | ICD-10-CM | POA: Diagnosis present

## 2013-08-17 DIAGNOSIS — IMO0002 Reserved for concepts with insufficient information to code with codable children: Secondary | ICD-10-CM | POA: Diagnosis present

## 2013-08-17 DIAGNOSIS — Z87891 Personal history of nicotine dependence: Secondary | ICD-10-CM

## 2013-08-17 DIAGNOSIS — I10 Essential (primary) hypertension: Secondary | ICD-10-CM | POA: Diagnosis present

## 2013-08-17 DIAGNOSIS — G4733 Obstructive sleep apnea (adult) (pediatric): Secondary | ICD-10-CM | POA: Diagnosis present

## 2013-08-17 DIAGNOSIS — N179 Acute kidney failure, unspecified: Secondary | ICD-10-CM | POA: Diagnosis present

## 2013-08-17 DIAGNOSIS — I509 Heart failure, unspecified: Secondary | ICD-10-CM | POA: Diagnosis present

## 2013-08-17 DIAGNOSIS — I2589 Other forms of chronic ischemic heart disease: Secondary | ICD-10-CM | POA: Diagnosis present

## 2013-08-17 DIAGNOSIS — D649 Anemia, unspecified: Secondary | ICD-10-CM | POA: Diagnosis present

## 2013-08-17 DIAGNOSIS — I739 Peripheral vascular disease, unspecified: Secondary | ICD-10-CM | POA: Diagnosis present

## 2013-08-17 DIAGNOSIS — I5023 Acute on chronic systolic (congestive) heart failure: Principal | ICD-10-CM | POA: Diagnosis present

## 2013-08-17 DIAGNOSIS — Z7902 Long term (current) use of antithrombotics/antiplatelets: Secondary | ICD-10-CM

## 2013-08-17 DIAGNOSIS — I130 Hypertensive heart and chronic kidney disease with heart failure and stage 1 through stage 4 chronic kidney disease, or unspecified chronic kidney disease: Secondary | ICD-10-CM | POA: Diagnosis present

## 2013-08-17 DIAGNOSIS — N2581 Secondary hyperparathyroidism of renal origin: Secondary | ICD-10-CM | POA: Diagnosis present

## 2013-08-17 DIAGNOSIS — J4489 Other specified chronic obstructive pulmonary disease: Secondary | ICD-10-CM | POA: Diagnosis present

## 2013-08-17 DIAGNOSIS — N189 Chronic kidney disease, unspecified: Secondary | ICD-10-CM

## 2013-08-17 DIAGNOSIS — Z66 Do not resuscitate: Secondary | ICD-10-CM | POA: Clinically undetermined

## 2013-08-17 DIAGNOSIS — Z951 Presence of aortocoronary bypass graft: Secondary | ICD-10-CM

## 2013-08-17 DIAGNOSIS — I2510 Atherosclerotic heart disease of native coronary artery without angina pectoris: Secondary | ICD-10-CM | POA: Diagnosis present

## 2013-08-17 DIAGNOSIS — Z79899 Other long term (current) drug therapy: Secondary | ICD-10-CM

## 2013-08-17 LAB — BASIC METABOLIC PANEL WITH GFR
BUN: 61 mg/dL — ABNORMAL HIGH (ref 6–23)
CO2: 21 meq/L (ref 19–32)
Calcium: 8.1 mg/dL — ABNORMAL LOW (ref 8.4–10.5)
Chloride: 105 meq/L (ref 96–112)
Creatinine, Ser: 4.89 mg/dL — ABNORMAL HIGH (ref 0.50–1.10)
GFR calc Af Amer: 9 mL/min — ABNORMAL LOW
GFR calc non Af Amer: 8 mL/min — ABNORMAL LOW
Glucose, Bld: 337 mg/dL — ABNORMAL HIGH (ref 70–99)
Potassium: 4.9 meq/L (ref 3.5–5.1)
Sodium: 137 meq/L (ref 135–145)

## 2013-08-17 LAB — POCT I-STAT TROPONIN I: Troponin i, poc: 0.03 ng/mL (ref 0.00–0.08)

## 2013-08-17 LAB — CBC
HCT: 24.2 % — ABNORMAL LOW (ref 36.0–46.0)
Hemoglobin: 7.8 g/dL — ABNORMAL LOW (ref 12.0–15.0)
Hemoglobin: 8.1 g/dL — ABNORMAL LOW (ref 12.0–15.0)
MCH: 27.3 pg (ref 26.0–34.0)
MCH: 27.8 pg (ref 26.0–34.0)
MCHC: 32.9 g/dL (ref 30.0–36.0)
MCHC: 33.5 g/dL (ref 30.0–36.0)
MCV: 83.2 fL (ref 78.0–100.0)
Platelets: 216 10*3/uL (ref 150–400)
Platelets: 240 10*3/uL (ref 150–400)
RBC: 2.91 MIL/uL — ABNORMAL LOW (ref 3.87–5.11)
RDW: 14.5 % (ref 11.5–15.5)
WBC: 6.6 10*3/uL (ref 4.0–10.5)
WBC: 7.9 10*3/uL (ref 4.0–10.5)

## 2013-08-17 LAB — URINALYSIS, ROUTINE W REFLEX MICROSCOPIC
Bilirubin Urine: NEGATIVE
Glucose, UA: 500 mg/dL — AB
Ketones, ur: NEGATIVE mg/dL
Leukocytes, UA: NEGATIVE
Nitrite: NEGATIVE
Protein, ur: >300 mg/dL — AB
Specific Gravity, Urine: 1.013 (ref 1.005–1.030)
Urobilinogen, UA: 0.2 mg/dL (ref 0.0–1.0)
pH: 7 (ref 5.0–8.0)

## 2013-08-17 LAB — DIGOXIN LEVEL: Digoxin Level: 1.6 ng/mL (ref 0.8–2.0)

## 2013-08-17 LAB — PROTIME-INR: INR: 1.49 (ref 0.00–1.49)

## 2013-08-17 LAB — PROTEIN / CREATININE RATIO, URINE
Creatinine, Urine: 48.67 mg/dL
Protein Creatinine Ratio: 9.53 — ABNORMAL HIGH (ref 0.00–0.15)
Total Protein, Urine: 463.8 mg/dL

## 2013-08-17 LAB — MRSA PCR SCREENING: MRSA by PCR: NEGATIVE

## 2013-08-17 LAB — PRO B NATRIURETIC PEPTIDE: Pro B Natriuretic peptide (BNP): 28521 pg/mL — ABNORMAL HIGH (ref 0–125)

## 2013-08-17 MED ORDER — INSULIN ASPART 100 UNIT/ML ~~LOC~~ SOLN
6.0000 [IU] | Freq: Three times a day (TID) | SUBCUTANEOUS | Status: DC
  Administered 2013-08-18 – 2013-08-25 (×18): 6 [IU] via SUBCUTANEOUS

## 2013-08-17 MED ORDER — CLOPIDOGREL BISULFATE 75 MG PO TABS
75.0000 mg | ORAL_TABLET | Freq: Every day | ORAL | Status: DC
  Administered 2013-08-18 – 2013-08-25 (×8): 75 mg via ORAL
  Filled 2013-08-17 (×8): qty 1

## 2013-08-17 MED ORDER — PANTOPRAZOLE SODIUM 40 MG PO TBEC
40.0000 mg | DELAYED_RELEASE_TABLET | Freq: Every day | ORAL | Status: DC
  Administered 2013-08-18 – 2013-08-24 (×7): 40 mg via ORAL
  Filled 2013-08-17 (×6): qty 1

## 2013-08-17 MED ORDER — FOLIC ACID 1 MG PO TABS
1.0000 mg | ORAL_TABLET | Freq: Every day | ORAL | Status: DC
  Administered 2013-08-18 – 2013-08-25 (×8): 1 mg via ORAL
  Filled 2013-08-17 (×8): qty 1

## 2013-08-17 MED ORDER — WARFARIN SODIUM 3 MG PO TABS
3.0000 mg | ORAL_TABLET | ORAL | Status: AC
  Administered 2013-08-17: 3 mg via ORAL
  Filled 2013-08-17: qty 1

## 2013-08-17 MED ORDER — ATORVASTATIN CALCIUM 40 MG PO TABS
40.0000 mg | ORAL_TABLET | Freq: Every day | ORAL | Status: DC
  Administered 2013-08-18 – 2013-08-25 (×8): 40 mg via ORAL
  Filled 2013-08-17 (×8): qty 1

## 2013-08-17 MED ORDER — WARFARIN - PHARMACIST DOSING INPATIENT
Freq: Every day | Status: DC
  Administered 2013-08-18: 18:00:00

## 2013-08-17 MED ORDER — OMEGA-3-ACID ETHYL ESTERS 1 G PO CAPS
1.0000 g | ORAL_CAPSULE | Freq: Four times a day (QID) | ORAL | Status: DC
  Administered 2013-08-17 – 2013-08-25 (×29): 1 g via ORAL
  Filled 2013-08-17 (×34): qty 1

## 2013-08-17 MED ORDER — SENNA 8.6 MG PO TABS
1.0000 | ORAL_TABLET | Freq: Every day | ORAL | Status: DC
  Administered 2013-08-17 – 2013-08-24 (×8): 8.6 mg via ORAL
  Filled 2013-08-17 (×9): qty 1

## 2013-08-17 MED ORDER — DIGOXIN 125 MCG PO TABS: 125.0000 ug | ORAL_TABLET | Freq: Every day | ORAL | Status: DC

## 2013-08-17 MED ORDER — AMLODIPINE BESYLATE 10 MG PO TABS
10.0000 mg | ORAL_TABLET | Freq: Every day | ORAL | Status: DC
  Administered 2013-08-17 – 2013-08-25 (×9): 10 mg via ORAL
  Filled 2013-08-17 (×9): qty 1

## 2013-08-17 MED ORDER — ACETAMINOPHEN 650 MG RE SUPP: 650.0000 mg | Freq: Four times a day (QID) | RECTAL | Status: DC | PRN

## 2013-08-17 MED ORDER — FUROSEMIDE 10 MG/ML IJ SOLN
120.0000 mg | Freq: Two times a day (BID) | INTRAVENOUS | Status: DC
  Administered 2013-08-18 – 2013-08-19 (×3): 120 mg via INTRAVENOUS
  Filled 2013-08-17 (×6): qty 12

## 2013-08-17 MED ORDER — INSULIN ASPART 100 UNIT/ML ~~LOC~~ SOLN
0.0000 [IU] | Freq: Three times a day (TID) | SUBCUTANEOUS | Status: DC
  Administered 2013-08-18 (×2): 2 [IU] via SUBCUTANEOUS
  Administered 2013-08-19: 5 [IU] via SUBCUTANEOUS
  Administered 2013-08-20: 2 [IU] via SUBCUTANEOUS
  Administered 2013-08-20: 9 [IU] via SUBCUTANEOUS
  Administered 2013-08-21: 13:00:00 7 [IU] via SUBCUTANEOUS
  Administered 2013-08-21 – 2013-08-22 (×3): 2 [IU] via SUBCUTANEOUS
  Administered 2013-08-22: 3 [IU] via SUBCUTANEOUS
  Administered 2013-08-23: 1 [IU] via SUBCUTANEOUS
  Administered 2013-08-23 – 2013-08-24 (×3): 3 [IU] via SUBCUTANEOUS
  Administered 2013-08-24 – 2013-08-25 (×3): 2 [IU] via SUBCUTANEOUS

## 2013-08-17 MED ORDER — PNEUMOCOCCAL VAC POLYVALENT 25 MCG/0.5ML IJ INJ
0.5000 mL | INJECTION | INTRAMUSCULAR | Status: AC
  Administered 2013-08-21: 17:00:00 0.5 mL via INTRAMUSCULAR
  Filled 2013-08-17 (×2): qty 0.5

## 2013-08-17 MED ORDER — SODIUM CHLORIDE 0.9 % IJ SOLN
3.0000 mL | Freq: Two times a day (BID) | INTRAMUSCULAR | Status: DC
  Administered 2013-08-18 – 2013-08-23 (×10): 3 mL via INTRAVENOUS

## 2013-08-17 MED ORDER — FERROUS SULFATE 325 (65 FE) MG PO TABS
325.0000 mg | ORAL_TABLET | Freq: Two times a day (BID) | ORAL | Status: DC
  Administered 2013-08-17 – 2013-08-25 (×16): 325 mg via ORAL
  Filled 2013-08-17 (×17): qty 1

## 2013-08-17 MED ORDER — NITROGLYCERIN IN D5W 200-5 MCG/ML-% IV SOLN
5.0000 ug/min | Freq: Once | INTRAVENOUS | Status: AC
  Administered 2013-08-17: 5 ug/min via INTRAVENOUS
  Filled 2013-08-17: qty 250

## 2013-08-17 MED ORDER — CARVEDILOL 12.5 MG PO TABS
12.5000 mg | ORAL_TABLET | Freq: Two times a day (BID) | ORAL | Status: DC
  Administered 2013-08-18 – 2013-08-25 (×15): 12.5 mg via ORAL
  Filled 2013-08-17 (×20): qty 1

## 2013-08-17 MED ORDER — KIDNEY FAILURE BOOK
Freq: Once | Status: AC
  Administered 2013-08-17
  Filled 2013-08-17: qty 1

## 2013-08-17 MED ORDER — ONDANSETRON HCL 4 MG/2ML IJ SOLN: 4.0000 mg | Freq: Four times a day (QID) | INTRAMUSCULAR | Status: DC | PRN

## 2013-08-17 MED ORDER — INSULIN GLARGINE 100 UNIT/ML ~~LOC~~ SOLN
10.0000 [IU] | Freq: Every day | SUBCUTANEOUS | Status: DC
  Administered 2013-08-17 – 2013-08-24 (×8): 10 [IU] via SUBCUTANEOUS
  Filled 2013-08-17 (×9): qty 0.1

## 2013-08-17 MED ORDER — ONDANSETRON HCL 4 MG PO TABS: 4.0000 mg | ORAL_TABLET | Freq: Four times a day (QID) | ORAL | Status: DC | PRN

## 2013-08-17 MED ORDER — ALBUTEROL SULFATE (5 MG/ML) 0.5% IN NEBU: 2.5000 mg | INHALATION_SOLUTION | RESPIRATORY_TRACT | Status: DC | PRN

## 2013-08-17 MED ORDER — CARVEDILOL 12.5 MG PO TABS
12.5000 mg | ORAL_TABLET | Freq: Once | ORAL | Status: AC
  Administered 2013-08-17: 12.5 mg via ORAL
  Filled 2013-08-17: qty 1

## 2013-08-17 MED ORDER — ACETAMINOPHEN 325 MG PO TABS: 650.0000 mg | ORAL_TABLET | Freq: Four times a day (QID) | ORAL | Status: DC | PRN

## 2013-08-17 MED ORDER — INSULIN ASPART 100 UNIT/ML ~~LOC~~ SOLN
0.0000 [IU] | Freq: Every day | SUBCUTANEOUS | Status: DC
  Administered 2013-08-17: 3 [IU] via SUBCUTANEOUS
  Administered 2013-08-23: 22:00:00 2 [IU] via SUBCUTANEOUS
  Administered 2013-08-24: 23:00:00 3 [IU] via SUBCUTANEOUS

## 2013-08-17 MED ORDER — NITROGLYCERIN IN D5W 200-5 MCG/ML-% IV SOLN
10.0000 ug/min | INTRAVENOUS | Status: DC
  Filled 2013-08-17: qty 250

## 2013-08-17 MED ORDER — POLYETHYLENE GLYCOL 3350 17 G PO PACK
17.0000 g | PACK | Freq: Every day | ORAL | Status: DC
  Administered 2013-08-18 – 2013-08-25 (×8): 17 g via ORAL
  Filled 2013-08-17 (×8): qty 1

## 2013-08-17 MED ORDER — NITROGLYCERIN 0.4 MG SL SUBL: 0.4000 mg | SUBLINGUAL_TABLET | SUBLINGUAL | Status: DC | PRN

## 2013-08-17 MED ORDER — NIACIN ER (ANTIHYPERLIPIDEMIC) 500 MG PO TBCR
500.0000 mg | EXTENDED_RELEASE_TABLET | Freq: Every day | ORAL | Status: DC
  Administered 2013-08-17 – 2013-08-23 (×7): 500 mg via ORAL
  Filled 2013-08-17 (×8): qty 1

## 2013-08-17 NOTE — ED Provider Notes (Signed)
CSN: [id number]     Arrival date & time 08/17/13  1543 History   First MD Initiated Contact with Patient 08/17/13 1719     Chief Complaint  Patient presents with  . Shortness of Breath   (Consider location/radiation/quality/duration/timing/severity/associated sxs/prior Treatment) The history is provided by the patient.   patient here complaining of a two-day history of dyspnea on exertion as well as orthopnea. Also notes chest tightness with exertion as well 2. Cough has been nonproductive without fever. No vomiting or diarrhea. History of similar symptoms associated with CHF. No treatment used for this prior to arrival. Denies any syncope or syncope. No urinary symptoms. No abdominal pain.  Past Medical History  Diagnosis Date  . CAD (coronary artery disease)   . Hypertension   . Diabetes mellitus     type 2 insulin dependent  . Hyperlipidemia   . CHF (congestive heart failure)   . Cellulitis     left foot  . Ulcer     diabetic ulcer on left foot  . DVT (deep venous thrombosis)     peroneal vein  . Peripheral vascular disease   . Chronic kidney disease   . Anemia    Past Surgical History  Procedure Laterality Date  . Tubal ligation    . Pr vein bypass graft,aorto-fem-pop  06-26-11    1. PATENT LEFT FEMORAL-POPLITEAL BYPASS GRAFT W/NO EVEIDENCE OF STENOSIS. 2.VELOCITIES OF GREATER THAN 200 CM/'s NOTED ON PREVIOUS EXAM 10/03/11 WERE NOT ADEQUATELY VISULAIZED DURING THIS EXAM  . Coronary artery bypass graft  04/25/08    Dr. Tyrone Sage, CABG x 5   . Nm myoview ltd  01/13/13    LEXISCAN; LV WALL MOTION: LVEF 44%, INFERIOR AKINESIS, ANTEROAPICAL HYPOKINESIS  . Cardiac catheterization  04/22/08    SEVERE 3-VESSEL DISEASE CAD., CABG X 5 04/25/08 WITH DR. Tyrone Sage   Family History  Problem Relation Age of Onset  . Cancer Mother     Kidney  . Kidney disease Mother   . Cancer Father     skin   History  Substance Use Topics  . Smoking status: Former Smoker -- 2.00 packs/day for 20  years    Types: Cigarettes    Quit date: 07/15/1996  . Smokeless tobacco: Former Neurosurgeon    Types: Snuff  . Alcohol Use: No   OB History   Grav Para Term Preterm Abortions TAB SAB Ect Mult Living                 Review of Systems  All other systems reviewed and are negative.    Allergies  Ivp dye and Aspirin  Home Medications   Current Outpatient Rx  Name  Route  Sig  Dispense  Refill  . amLODipine (NORVASC) 5 MG tablet   Oral   Take 10 mg by mouth daily.          [name] atorvastatin (LIPITOR) 40 MG tablet   Oral   Take 40 mg by mouth daily.           . carvedilol (COREG) 12.5 MG tablet   Oral   Take 12.5 mg by mouth 2 (two) times daily with a meal.           . clopidogrel (PLAVIX) 75 MG tablet   Oral   Take 75 mg by mouth daily.           . digoxin (LANOXIN) 0.125 MG tablet   Oral   Take 125 mcg by mouth daily.           [name]  ferrous sulfate 325 (65 FE) MG tablet   Oral   Take 325 mg by mouth 2 (two) times daily.           . folic acid (FOLVITE) 1 MG tablet   Oral   Take 1 mg by mouth daily.           . furosemide (LASIX) 40 MG tablet   Oral   Take 40 mg by mouth daily as needed for fluid.          [name] insulin aspart (NOVOLOG) 100 UNIT/ML injection   Subcutaneous   Inject 20-26 Units into the skin 3 (three) times daily before meals. Take 20 units at breakfast and lunch. Take 26 units at dinner         . isosorbide mononitrate (IMDUR) 30 MG 24 hr tablet   Oral   Take 0.5 tablets (15 mg total) by mouth daily.   90 tablet   3     Take 1/2 tablet by mouth daily   . lisinopril (PRINIVIL,ZESTRIL) 40 MG tablet   Oral   Take 40 mg by mouth daily.           . niacin (NIASPAN) 500 MG CR tablet   Oral   Take 500 mg by mouth at bedtime.           . nitroGLYCERIN (NITROSTAT) 0.4 MG SL tablet   Sublingual   Place 0.4 mg under the tongue every 5 (five) minutes as needed for chest pain.         [name] omega-3 acid ethyl esters (LOVAZA) 1 G  capsule   Oral   Take 1 g by mouth 4 (four) times daily.          [name] omeprazole (PRILOSEC) 20 MG capsule   Oral   Take 20 mg by mouth daily.           . polyethylene glycol (MIRALAX / GLYCOLAX) packet   Oral   Take 17 g by mouth daily.           . ramipril (ALTACE) 10 MG tablet   Oral   Take 10 mg by mouth daily.         [name] senna (SENOKOT) 8.6 MG tablet   Oral   Take 1 tablet by mouth at bedtime.         [name] warfarin (COUMADIN) 4 MG tablet   Oral   Take 4-6 mg by mouth. TAKES 1 TABLET (4 MG) MON, WEDS, FRI & SUN.  TAKES 1.5 TABLET (6 MG) TUES, THURS, SAT.          BP 185/56  Pulse 62  Temp(Src) 98.3 F (36.8 C) (Oral)  Resp 22  Ht 5\' 3"  (1.6 m)  Wt 148 lb (67.132 kg)  BMI 26.22 kg/m2  SpO2 96% Physical Exam  Nursing note and vitals reviewed. Constitutional: She is oriented to person, place, and time. She appears well-developed and well-nourished.  Non-toxic appearance. No distress.  HENT:  Head: Normocephalic and atraumatic.  Eyes: Conjunctivae, EOM and lids are normal. Pupils are equal, round, and reactive to light.  Neck: Normal range of motion. Neck supple. No tracheal deviation present. No mass present.  Cardiovascular: Normal rate, regular rhythm and normal heart sounds.  Exam reveals no gallop.   No murmur heard. Pulmonary/Chest: Effort normal. No stridor. No respiratory distress. She has decreased breath sounds. She has no wheezes. She has rhonchi. She has no rales.  Abdominal: Soft. Normal appearance and bowel sounds  are normal. She exhibits no distension. There is no tenderness. There is no rebound and no CVA tenderness.  Musculoskeletal: Normal range of motion. She exhibits no edema and no tenderness.  2+ bilateral lower extremity pitting edema  Neurological: She is alert and oriented to person, place, and time. She has normal strength. No cranial nerve deficit or sensory deficit. GCS eye subscore is 4. GCS verbal subscore is 5. GCS motor subscore is  6.  Skin: Skin is warm and dry. No abrasion and no rash noted.  Psychiatric: She has a normal mood and affect. Her speech is normal and behavior is normal.    ED Course  Procedures (including critical care time) Labs Review Labs Reviewed  PRO B NATRIURETIC PEPTIDE - Abnormal; Notable for the following:    Pro B Natriuretic peptide (BNP) 28521.0 (*)    All other components within normal limits  BASIC METABOLIC PANEL - Abnormal; Notable for the following:    Glucose, Bld 337 (*)    BUN 61 (*)    Creatinine, Ser 4.89 (*)    Calcium 8.1 (*)    GFR calc non Af Amer 8 (*)    GFR calc Af Amer 9 (*)    All other components within normal limits  CBC - Abnormal; Notable for the following:    RBC 2.91 (*)    Hemoglobin 8.1 (*)    HCT 24.2 (*)    All other components within normal limits  PROTIME-INR - Abnormal; Notable for the following:    Prothrombin Time 17.6 (*)    All other components within normal limits  POCT I-STAT TROPONIN I   Imaging Review Dg Chest 2 View  08/17/2013   CLINICAL DATA:  The findings suggest congestive heart failure superimposed upon underlying COPD. No discrete focal pneumonia is demonstrated. Followup films following therapy would be useful to assure clearing.  EXAM: CHEST  2 VIEW  COMPARISON:  January 29, 2013.  FINDINGS: The lungs are mildly hyperinflated. There is stable nodularity in the right pulmonary apex. There are new small bilateral pleural effusions greater on the right than on the left. The interstitial markings are increased diffusely which is not entirely new but it is more conspicuous than in the past. There is pleural based increased density laterally in the left mid thorax. The cardiac silhouette is top normal in size.  IMPRESSION: The findings are consistent with congestive heart failure superimposed upon COPD. Pleural based density laterally on the left is nonspecific and could reflect an area of early infiltrate. There is stable spiculated density in  the right pulmonary apex likely reflecting scarring. Followup films are recommended following therapy to ensure clearing.   Electronically Signed   By: David  [place]   On: 08/17/2013 16:52    EKG Interpretation     Ventricular Rate:  63 PR Interval:  154 QRS Duration: 114 QT Interval:  428 QTC Calculation: 437 R Axis:   19 Text Interpretation:  Normal sinus rhythm Possible Lateral infarct , age undetermined Inferior-posterior infarct , age undetermined Abnormal ECG No significant change since last tracing            MDM  No diagnosis found. Patient started on nitroglycerin for her CHF and will be admitted by the hospitalist service    Toy Baker, MD 08/17/13 1735

## 2013-08-17 NOTE — ED Notes (Signed)
Internal Medicine at the bedside. 

## 2013-08-17 NOTE — Consult Note (Signed)
Requesting Physician:  Dr. Waymon Amato Reason for Consult:  AKI on CKD PCP: Dr. Juleen China Nephrologist: Dr. Darrick Penna  HPI: The patient is a 72 y.o. year-old AAF with a background of DM2, HTN, CAD with h/o CABG, ischemic cardiomyopathy and baseline CKD4.  She presented to the ED tonight with complaints onset of DOE and worsening lower extremity edema on Saturday (has had chronic left leg edema since a left fem-pop bypass 2 years ago but noted worsening in this as well as some RLE edema..  Had some associated chest pressure, some left arm pain on Sunday that resolved with SL nitroglycerin.  But due to persistence of DOE presented to the ED for evaluation and was found to have BP of 185/56, exam and radiographic evidence of CHF with volume overload and edema, creatinine of 4.89, Hb of 8.1.  She was started on IV nitroglycerin in the ED and is now getting IV furosemide and BP meds.  ACE's have been stopped.  Most recent available creatinine prior to today is from April 2014 - 2.8.   Patient has seen Dr. Darrick Penna on 2 or 3 occasions (but chart could not be located this evening so I don't know about interim labs) and told her she had "15% kidney function and might need dialysis in the future" though has not been to TOPS classes or seen [facility] for access evaluation.    I do not know what workup has been done to date. I do see a renal ultrasound from 2012 that showed  10.2 and 11.3 cm kidneys with some increased echogencity.Patient and daughter did state that he was starting her on "shots for her low blood count" but that when she went for her injection of what I presume to be procrit BP was too high to receive injection.  She does not know her medicines by name (says - "they are on the list") although says she does take a fluid pill with directions that say 1/2 - 1 tablet - but not sure what.  Her med rec shows furosemide 40 "as needed" and also shows 2 ACE inhibitors ???  (Daughter says will bring meds in tomorrow  for review)   Creatinine trending is as follows: Creatinine, Ser  Date/Time Value Range Status  08/17/2013  4:03 PM 4.89* 0.50 - 1.10 mg/dL Final  9/60/4540 98:11 AM 2.80* 0.50 - 1.10 mg/dL Final  06/27/7828  5:62 PM 2.60* 0.50 - 1.10 mg/dL Final  11/13/8655  8:46 AM 1.94* 0.50 - 1.10 mg/dL Final  9/62/9528  4:13 AM 2.35* 0.50 - 1.10 mg/dL Final  2/44/0102  7:25 AM 2.29* 0.50 - 1.10 mg/dL Final  3/66/4403  4:74 AM 1.96* 0.50 - 1.10 mg/dL Final  2/59/5638  7:56 AM 1.99* 0.50 - 1.10 mg/dL Final  4/33/2951  8:84 AM 1.82* 0.50 - 1.10 mg/dL Final  1/66/0630  1:60 AM 1.48* 0.50 - 1.10 mg/dL Final  10/22/3233  5:73 AM 1.88* 0.50 - 1.10 mg/dL Final  12/03/2540  7:06 AM 1.44* 0.50 - 1.10 mg/dL Final  2/37/6283  1:51 AM 1.54* 0.50 - 1.10 mg/dL Final  04/18/1606  3:71 AM 1.73* 0.50 - 1.10 mg/dL Final  0/03/2693  8:54 AM 1.56* 0.50 - 1.10 mg/dL Final  03/15/7034  0:09 AM 1.47* 0.50 - 1.10 mg/dL Final  12/20/1827  9:37 PM 1.70* 0.50 - 1.10 mg/dL Final  10/19/9676  9:38 AM 1.71* 0.50 - 1.10 mg/dL Final  1/0/1751  0:25 AM 1.42* 0.50 - 1.10 mg/dL Final  05/18/2777  2:42 AM 1.29* 0.50 -  1.10 mg/dL Final  10/19/1094 04:54 AM 1.60* 0.50 - 1.10 mg/dL Final  0/06/8118  1:47 AM 1.2   Final  05/02/2008  4:20 AM 1.02   Final  05/01/2008  4:40 AM 1.12   Final  04/30/2008  4:28 AM 0.98   Final  04/28/2008  4:08 AM 1.16   Final  04/27/2008  5:15 AM 1.35*  Final  04/26/2008  5:21 PM 1.4*  Final  04/26/2008  5:10 PM 1.44*  Final  04/26/2008  5:05 AM 1.20   Final  04/25/2008 10:28 PM 1.1   Final  04/25/2008 10:15 PM 1.11   Final  04/25/2008  5:40 AM 0.91   Final  04/24/2008  7:40 AM 1.09   Final  04/23/2008  5:15 AM 1.01   Final  04/22/2008  5:45 AM 0.97   Final  04/20/2008  3:41 PM 1.18   Final  04/20/2008  3:07 PM 1.3*  Final  04/20/2008  2:00 PM 1.3*  Final    Past Medical History:  Past Medical History  Diagnosis Date  . CAD (coronary artery disease)   . Hypertension   . Diabetes mellitus     type 2 insulin dependent  .  Hyperlipidemia   . CHF (congestive heart failure)   . Cellulitis     left foot  . Ulcer     diabetic ulcer on left foot  . DVT (deep venous thrombosis)     peroneal vein  . Peripheral vascular disease   . Chronic kidney disease   . Anemia     Past Surgical History:  Past Surgical History  Procedure Laterality Date  . Tubal ligation    . Pr vein bypass graft,aorto-fem-pop  06-26-11    1. PATENT LEFT FEMORAL-POPLITEAL BYPASS GRAFT W/NO EVEIDENCE OF STENOSIS. 2.VELOCITIES OF GREATER THAN 200 CM/'s NOTED ON PREVIOUS EXAM 10/03/11 WERE NOT ADEQUATELY VISULAIZED DURING THIS EXAM  . Coronary artery bypass graft  04/25/08    Dr. Tyrone Sage, CABG x 5   . Nm myoview ltd  01/13/13    LEXISCAN; LV WALL MOTION: LVEF 44%, INFERIOR AKINESIS, ANTEROAPICAL HYPOKINESIS  . Cardiac catheterization  04/22/08    SEVERE 3-VESSEL DISEASE CAD., CABG X 5 04/25/08 WITH DR. Tyrone Sage    Family History:  Family History  Problem Relation Age of Onset  . Cancer Mother     Kidney  . Kidney disease Mother   . Cancer Father     skin   Social History:  reports that she quit smoking about 17 years ago. Her smoking use included Cigarettes. She has a 40 pack-year smoking history. She has quit using smokeless tobacco. Her smokeless tobacco use included Snuff. She reports that she does not drink alcohol or use illicit drugs.  Allergies:  Allergies  Allergen Reactions  . Ivp Dye [Iodinated Diagnostic Agents]   . Aspirin Hives    Home medications: Prior to Admission medications   Medication Sig Start Date End Date Taking? Authorizing Provider  amLODipine (NORVASC) 5 MG tablet Take 10 mg by mouth daily.    Yes Historical Provider, MD  atorvastatin (LIPITOR) 40 MG tablet Take 40 mg by mouth daily.     Yes Historical Provider, MD  carvedilol (COREG) 12.5 MG tablet Take 12.5 mg by mouth 2 (two) times daily with a meal.     Yes Historical Provider, MD  clopidogrel (PLAVIX) 75 MG tablet Take 75 mg by mouth daily.     Yes  Historical Provider, MD  digoxin (LANOXIN) 0.125 MG tablet  Take 125 mcg by mouth daily.     Yes Historical Provider, MD  ferrous sulfate 325 (65 FE) MG tablet Take 325 mg by mouth 2 (two) times daily.     Yes Historical Provider, MD  folic acid (FOLVITE) 1 MG tablet Take 1 mg by mouth daily.     Yes Historical Provider, MD  furosemide (LASIX) 40 MG tablet Take 40 mg by mouth daily as needed for fluid.    Yes Historical Provider, MD  insulin aspart (NOVOLOG) 100 UNIT/ML injection Inject 20-26 Units into the skin 3 (three) times daily before meals. Take 20 units at breakfast and lunch. Take 26 units at dinner   Yes Historical Provider, MD  isosorbide mononitrate (IMDUR) 30 MG 24 hr tablet Take 0.5 tablets (15 mg total) by mouth daily. 03/02/13  Yes Chrystie Nose, MD  lisinopril (PRINIVIL,ZESTRIL) 40 MG tablet Take 40 mg by mouth daily.     Yes Historical Provider, MD  niacin (NIASPAN) 500 MG CR tablet Take 500 mg by mouth at bedtime.     Yes Historical Provider, MD  nitroGLYCERIN (NITROSTAT) 0.4 MG SL tablet Place 0.4 mg under the tongue every 5 (five) minutes as needed for chest pain.   Yes Historical Provider, MD  omega-3 acid ethyl esters (LOVAZA) 1 G capsule Take 1 g by mouth 4 (four) times daily.    Yes Historical Provider, MD  omeprazole (PRILOSEC) 20 MG capsule Take 20 mg by mouth daily.     Yes Historical Provider, MD  polyethylene glycol (MIRALAX / GLYCOLAX) packet Take 17 g by mouth daily.     Yes Historical Provider, MD  ramipril (ALTACE) 10 MG tablet Take 10 mg by mouth daily.   Yes Historical Provider, MD  senna (SENOKOT) 8.6 MG tablet Take 1 tablet by mouth at bedtime.   Yes Historical Provider, MD  warfarin (COUMADIN) 4 MG tablet Take 4-6 mg by mouth. TAKES 1 TABLET (4 MG) MON, WEDS, FRI & SUN.  TAKES 1.5 TABLET (6 MG) TUES, THURS, SAT.   Yes Historical Provider, MD    Inpatient medications: . amLODipine  10 mg Oral Daily  . [START ON 08/18/2013] atorvastatin  40 mg Oral Daily  .  [START ON 08/18/2013] carvedilol  12.5 mg Oral BID WC  . [START ON 08/18/2013] clopidogrel  75 mg Oral Daily  . [START ON 08/18/2013] digoxin  125 mcg Oral Daily  . ferrous sulfate  325 mg Oral BID  . [START ON 08/18/2013] folic acid  1 mg Oral Daily  . furosemide  120 mg Intravenous Q12H  . insulin aspart  0-5 Units Subcutaneous QHS  . [START ON 08/18/2013] insulin aspart  0-9 Units Subcutaneous TID WC  . [START ON 08/18/2013] insulin aspart  6 Units Subcutaneous TID WC  . insulin glargine  10 Units Subcutaneous QHS  . niacin  500 mg Oral QHS  . omega-3 acid ethyl esters  1 g Oral QID  . [START ON 08/18/2013] pantoprazole  40 mg Oral Daily  . [START ON 08/18/2013] polyethylene glycol  17 g Oral Daily  . senna  1 tablet Oral QHS  . sodium chloride  3 mL Intravenous Q12H    Review of Systems Gen:  Denies headache, fever, chills, sweats.  No weight loss. HEENT:  No visual change, sore throat, difficulty swallowing. Resp:  + DOE, PND, orthopnea, worsening leg edema since Saturday with CHRONIC right leg swelling Cardiac:  + chest pressure, pain into left arm relieved by SL nitro .  GI:   Denies abdominal pain.   No nausea, vomiting, diarrhea.  No constipation. GU:  Denies difficulty or change in voiding.  No change in urine color.     MS:  Denies joint pain or swelling.   Derm:  Denies skin rash or itching.  No chronic skin conditions.  Neuro:   Denies focal weakness, memory problems, hx stroke or TIA.   Psych:  Denies symptoms of depression of anxiety.  No hallucination.    Physical Exam:  Blood pressure 199/64, pulse 63, temperature 97.8 F (36.6 C), temperature source Oral, resp. rate 16, height 5\' 3"  (1.6 m), weight 69.3 kg (152 lb 12.5 oz), SpO2 97.00%.  Gen: elderly BF pleasant, NAD Skin: no rash, cyanosis Neck: JVD to jaw angle, no bruits  Chest: Bilateral basilar crackles about 1/3 up posteriorly No wheezes. Healed scar from prior CABG Heart: Dynamic precordium PMI lat displaced  S4S1S2 no S3 Abdomen: soft, no focal tenderness.  Loud left sided abdominal bruit Ext: Scars from prior fem pop  Woody edema 2+ of LLE 1+ of RLE Neuro: alert, Ox3, no focal deficit Heme/Lymph: no bruising or LAN  Labs: Basic Metabolic Panel:  Recent Labs Lab 08/17/13 1603  NA 137  K 4.9  CL 105  CO2 21  GLUCOSE 337*  BUN 61*  CREATININE 4.89*  CALCIUM 8.1*   Recent Labs Lab 08/17/13 1603  WBC 6.6  HGB 8.1*  HCT 24.2*  MCV 83.2  PLT 240   Results for EVON, DEJARNETT (MRN [id number]) as of 08/17/2013 19:40  Ref. Range 08/17/2013 16:03  Pro B Natriuretic peptide (BNP) Latest Range: 0-125 pg/mL 28521.0 (H)   Cardiac Enzymes: No results found for this basename: CKTOTAL, CKMB, CKMBINDEX, TROPONINI,  in the last 168 hours CBG: No results found for this basename: GLUCAP,  in the last 168 hours  Iron Studies: No results found for this basename: IRON, TIBC, TRANSFERRIN, FERRITIN,  in the last 168 hours  Xrays/Other Studies: Dg Chest 2 View  08/17/2013   CLINICAL DATA:  The findings suggest congestive heart failure superimposed upon underlying COPD. No discrete focal pneumonia is demonstrated. Followup films following therapy would be useful to assure clearing.  EXAM: CHEST  2 VIEW  COMPARISON:  January 29, 2013.  FINDINGS: The lungs are mildly hyperinflated. There is stable nodularity in the right pulmonary apex. There are new small bilateral pleural effusions greater on the right than on the left. The interstitial markings are increased diffusely which is not entirely new but it is more conspicuous than in the past. There is pleural based increased density laterally in the left mid thorax. The cardiac silhouette is top normal in size.  IMPRESSION: The findings are consistent with congestive heart failure superimposed upon COPD. Pleural based density laterally on the left is nonspecific and could reflect an area of early infiltrate. There is stable spiculated density in the right pulmonary  apex likely reflecting scarring. Followup films are recommended following therapy to ensure clearing.   Electronically Signed   By: David  [place]   On: 08/17/2013 16:52   Problems  1. CHF - Acute,  in the setting of accelerated hypertension, volume overload 2. AKI (presumed) on CKD4 - most recent baseline creatinine not available from our office, possibly on dual ACE inhibitors; no access plans or TOPS education to date per patient 3. Accelerated hypertension 4. 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 DMII insulin requiring 7. COPD/OSA on CPAP 8. PAD with prior fem pop 9. CAD with prior CABG  Recommendations: Agree with plans to diurese Stop ACE inhibitor(s) as you have done Get office records from [facility] in the AM to determine what workup already done for her CKD Save left arm, vein mapping Check PTH, phos Dialysis videos Renal diet No indication for acute dialysis tonight but unclear which way renal function will go with control of BP  Daughter will bring meds in for review in the AM for adequate med rec Check iron studies Aranesp when BP controlled a little better  Thanks for consult.  Will follow closely w/you  Camille Bal,  MD [facility] [phone] pager 08/17/2013, 7:36 PM

## 2013-08-17 NOTE — ED Notes (Signed)
Pt has had increasing sob since Sunday.  Pt states that this sob increases with activity.  Pt states that she has been using her inhaler q2h with some relief.  Pt states that she has had left arm pain with this.  Pt states that she hasnt taken her insulin today. Pt denies any URI

## 2013-08-17 NOTE — Progress Notes (Signed)
ANTICOAGULATION CONSULT NOTE - Initial Consult  Pharmacy Consult:  Coumadin Indication:  PVD  Allergies  Allergen Reactions  . Ivp Dye [Iodinated Diagnostic Agents]   . Aspirin Hives    Patient Measurements: Height: 5\' 3"  (160 cm) Weight: 152 lb 12.5 oz (69.3 kg) IBW/kg (Calculated) : 52.4  Vital Signs: Temp: 97.8 F (36.6 C) (11/04 1849) Temp src: Oral (11/04 1849) BP: 199/64 mmHg (11/04 1849) Pulse Rate: 63 (11/04 1849)  Labs:  Recent Labs  08/17/13 1603  HGB 8.1*  HCT 24.2*  PLT 240  LABPROT 17.6*  INR 1.49  CREATININE 4.89*    Estimated Creatinine Clearance: 9.9 ml/min (by C-G formula based on Cr of 4.89).   Medical History: Past Medical History  Diagnosis Date  . CAD (coronary artery disease)   . Hypertension   . Diabetes mellitus     type 2 insulin dependent  . Hyperlipidemia   . CHF (congestive heart failure)   . Cellulitis     left foot  . Ulcer     diabetic ulcer on left foot  . DVT (deep venous thrombosis)     peroneal vein  . Peripheral vascular disease   . Chronic kidney disease   . Anemia         Assessment: 41 YOF with history of CHF admitted with complaint of dyspnea.  Pharmacy consulted to manage Coumadin, continued from home.  Based on previous record, patient underwent fem-pop bypass graft for PVD in September of 2012 and was started on Coumadin.  Later that month, she was diagnosed with peroneal vein DVT.  Patient's INR is sub-therapeutic and she reports taking Coumadin 6mg  PO this AM.  No bleeding reported.   Goal of Therapy:  INR 2-3 Monitor platelets by anticoagulation protocol: Yes    Plan:  - Coumadin 3mg  PO now (total of 9mg  today) - Daily PT / INR - Spoke to Dr. Eliott Nine, hold digoxin due to elevated level - F/U with updated med hx (patient's daughter to bring in med bottle tomorrow per MD)    Chelsea Aus D. Laney Potash, PharmD, BCPS Pager:  [phone] 08/17/2013, 8:46 PM

## 2013-08-17 NOTE — H&P (Signed)
[facility] History and Physical  MAYTE DIERS ZOX:[id number] DOB: [birth year] DOA: 08/17/2013  Referring physician: EDP PCP: Michiel Sites, MD  Outpatient Specialists:  1. Cardiology: Dr. Rennis Golden 2. Nephrology: Dr. Darrick Penna  Chief Complaint: Dyspnea  HPI: [name] is a 72 y.o. female with history of CAD, CABG, her dyslipidemia, hypertension, IDDM, stage IV chronic kidney disease, chronic lung scarring on CT chest, ischemic cardiomyopathy with EF 44%, OSA on CPAP, COPD, on Coumadin anticoagulation-? Etiology, presents to the ED with worsening dyspnea. Patient claims compliance with medications but not compliant with salt restricted diet. She gives 3 days history of progressively worsening dyspnea on exertion, orthopnea, PND. She denies chest pain or worsening leg edema. She states that walking even a few steps brings on dyspnea. She only is able to sleep for 2 hours at night and has to wakeup secondary to dyspnea. In the ED, chest x-ray suggestive of CHF, creatinine worse >4 which was 2.8 in April. In reviewing her home med rec: Is on Lasix only when necessary and seems to be on dual ACE inhibitors. Patient has been started on IV nitroglycerin drip and hospitalist admission requested.   Review of Systems: All systems reviewed and apart from history of presenting illness, are negative.  Past Medical History  Diagnosis Date  . CAD (coronary artery disease)   . Hypertension   . Diabetes mellitus     type 2 insulin dependent  . Hyperlipidemia   . CHF (congestive heart failure)   . Cellulitis     left foot  . Ulcer     diabetic ulcer on left foot  . DVT (deep venous thrombosis)     peroneal vein  . Peripheral vascular disease   . Chronic kidney disease   . Anemia    Past Surgical History  Procedure Laterality Date  . Tubal ligation    . Pr vein bypass graft,aorto-fem-pop  06-26-11    1. PATENT LEFT FEMORAL-POPLITEAL BYPASS GRAFT W/NO EVEIDENCE OF STENOSIS. 2.VELOCITIES OF  GREATER THAN 200 CM/'s NOTED ON PREVIOUS EXAM 10/03/11 WERE NOT ADEQUATELY VISULAIZED DURING THIS EXAM  . Coronary artery bypass graft  04/25/08    Dr. Tyrone Sage, CABG x 5   . Nm myoview ltd  01/13/13    LEXISCAN; LV WALL MOTION: LVEF 44%, INFERIOR AKINESIS, ANTEROAPICAL HYPOKINESIS  . Cardiac catheterization  04/22/08    SEVERE 3-VESSEL DISEASE CAD., CABG X 5 04/25/08 WITH DR. Tyrone Sage   Social History:  reports that she quit smoking about 17 years ago. Her smoking use included Cigarettes. She has a 40 pack-year smoking history. She has quit using smokeless tobacco. Her smokeless tobacco use included Snuff. She reports that she does not drink alcohol or use illicit drugs. Separated. Independent of activities of daily living.  Allergies  Allergen Reactions  . Ivp Dye [Iodinated Diagnostic Agents]   . Aspirin Hives    Family History  Problem Relation Age of Onset  . Cancer Mother     Kidney  . Kidney disease Mother   . Cancer Father     skin    Prior to Admission medications   Medication Sig Start Date End Date Taking? Authorizing Provider  amLODipine (NORVASC) 5 MG tablet Take 10 mg by mouth daily.    Yes Historical Provider, MD  atorvastatin (LIPITOR) 40 MG tablet Take 40 mg by mouth daily.     Yes Historical Provider, MD  carvedilol (COREG) 12.5 MG tablet Take 12.5 mg by mouth 2 (two) times daily with a  meal.     Yes Historical Provider, MD  clopidogrel (PLAVIX) 75 MG tablet Take 75 mg by mouth daily.     Yes Historical Provider, MD  digoxin (LANOXIN) 0.125 MG tablet Take 125 mcg by mouth daily.     Yes Historical Provider, MD  ferrous sulfate 325 (65 FE) MG tablet Take 325 mg by mouth 2 (two) times daily.     Yes Historical Provider, MD  folic acid (FOLVITE) 1 MG tablet Take 1 mg by mouth daily.     Yes Historical Provider, MD  furosemide (LASIX) 40 MG tablet Take 40 mg by mouth daily as needed for fluid.    Yes Historical Provider, MD  insulin aspart (NOVOLOG) 100 UNIT/ML injection  Inject 20-26 Units into the skin 3 (three) times daily before meals. Take 20 units at breakfast and lunch. Take 26 units at dinner   Yes Historical Provider, MD  isosorbide mononitrate (IMDUR) 30 MG 24 hr tablet Take 0.5 tablets (15 mg total) by mouth daily. 03/02/13  Yes Chrystie Nose, MD  lisinopril (PRINIVIL,ZESTRIL) 40 MG tablet Take 40 mg by mouth daily.     Yes Historical Provider, MD  niacin (NIASPAN) 500 MG CR tablet Take 500 mg by mouth at bedtime.     Yes Historical Provider, MD  nitroGLYCERIN (NITROSTAT) 0.4 MG SL tablet Place 0.4 mg under the tongue every 5 (five) minutes as needed for chest pain.   Yes Historical Provider, MD  omega-3 acid ethyl esters (LOVAZA) 1 G capsule Take 1 g by mouth 4 (four) times daily.    Yes Historical Provider, MD  omeprazole (PRILOSEC) 20 MG capsule Take 20 mg by mouth daily.     Yes Historical Provider, MD  polyethylene glycol (MIRALAX / GLYCOLAX) packet Take 17 g by mouth daily.     Yes Historical Provider, MD  ramipril (ALTACE) 10 MG tablet Take 10 mg by mouth daily.   Yes Historical Provider, MD  senna (SENOKOT) 8.6 MG tablet Take 1 tablet by mouth at bedtime.   Yes Historical Provider, MD  warfarin (COUMADIN) 4 MG tablet Take 4-6 mg by mouth. TAKES 1 TABLET (4 MG) MON, WEDS, FRI & SUN.  TAKES 1.5 TABLET (6 MG) TUES, THURS, SAT.   Yes Historical Provider, MD   Physical Exam: Filed Vitals:   08/17/13 1601 08/17/13 1753 08/17/13 1800  BP: 185/56 204/71 211/74  Pulse: 62 67 64  Temp: 98.3 F (36.8 C)    TempSrc: Oral    Resp: 22 20   Height: 5\' 3"  (1.6 m)    Weight: 67.132 kg (148 lb)    SpO2: 96% 95% 96%     General exam: Moderately built and nourished female patient, sitting propped up on the gurney in no obvious distress.  Head, eyes and ENT: Nontraumatic and normocephalic. Pupils equally reacting to light and accommodation. Oral mucosa moist.  Neck: Supple. No carotid bruit or thyromegaly. JVD +  Lymphatics: No  lymphadenopathy.  Respiratory system: Decreased breath sounds in the lower half of the lung fields with crackles. Rest of the lung fields show occasional expiratory rhonchi. No increased work of breathing.  Cardiovascular system: S1 and S2 heard, RRR. No JVD, murmurs, gallops, clicks . Trace bilateral ankle edema.  Gastrointestinal system: Abdomen is nondistended, soft and nontender. Normal bowel sounds heard. No organomegaly or masses appreciated.  Central nervous system: Alert and oriented. No focal neurological deficits.  Extremities: Symmetric 5 x 5 power. Peripheral pulses symmetrically felt.   Skin: No  rashes or acute findings.  Musculoskeletal system: Negative exam.  Psychiatry: Pleasant and cooperative.   Labs on Admission:  Basic Metabolic Panel:  Recent Labs Lab 08/17/13 1603  NA 137  K 4.9  CL 105  CO2 21  GLUCOSE 337*  BUN 61*  CREATININE 4.89*  CALCIUM 8.1*   Liver Function Tests: No results found for this basename: AST, ALT, ALKPHOS, BILITOT, PROT, ALBUMIN,  in the last 168 hours No results found for this basename: LIPASE, AMYLASE,  in the last 168 hours No results found for this basename: AMMONIA,  in the last 168 hours CBC:  Recent Labs Lab 08/17/13 1603  WBC 6.6  HGB 8.1*  HCT 24.2*  MCV 83.2  PLT 240   Cardiac Enzymes: No results found for this basename: CKTOTAL, CKMB, CKMBINDEX, TROPONINI,  in the last 168 hours  BNP (last 3 results)  Recent Labs  08/17/13 1603  PROBNP 28521.0*   CBG: No results found for this basename: GLUCAP,  in the last 168 hours  Radiological Exams on Admission: Dg Chest 2 View  08/17/2013   CLINICAL DATA:  The findings suggest congestive heart failure superimposed upon underlying COPD. No discrete focal pneumonia is demonstrated. Followup films following therapy would be useful to assure clearing.  EXAM: CHEST  2 VIEW  COMPARISON:  January 29, 2013.  FINDINGS: The lungs are mildly hyperinflated. There is stable  nodularity in the right pulmonary apex. There are new small bilateral pleural effusions greater on the right than on the left. The interstitial markings are increased diffusely which is not entirely new but it is more conspicuous than in the past. There is pleural based increased density laterally in the left mid thorax. The cardiac silhouette is top normal in size.  IMPRESSION: The findings are consistent with congestive heart failure superimposed upon COPD. Pleural based density laterally on the left is nonspecific and could reflect an area of early infiltrate. There is stable spiculated density in the right pulmonary apex likely reflecting scarring. Followup films are recommended following therapy to ensure clearing.   Electronically Signed   By: David  [place]   On: 08/17/2013 16:52    EKG: Independently reviewed. Normal sinus rhythm, Q waves in inferior leads and? RBBB. Does not look significantly different than prior EKG in May 2014.  Assessment/Plan Principal Problem:   Acute on chronic systolic CHF (congestive heart failure) Active Problems:   PVD (peripheral vascular disease)   S/P CABG x 5   COPD (chronic obstructive pulmonary disease)   OSA on CPAP   Essential hypertension   Renal failure, acute on chronic stage 4   Anemia   Acute on chronic systolic CHF/ischemic cardiomyopathy/CAD/CABG - In the context of worsening renal failure - Precipitated by dietary indiscretion and not on regular diuretics. - Admit to step down unit. - Continue IV nitroglycerin drip. - Start IV Lasix 120 mg every 12 hourly. - Nephrology consulted for fluid management-will see patient tomorrow  Accelerated hypertension - ? Compliance - Continue IV NTG - DC ACE inhibitors. Continue amlodipine and carvedilol. - Monitor closely.  Acute on stage IV chronic kidney disease - Possibly precipitated by dual ACE inhibitors and? Cardiorenal syndrome - DC ACE inhibitors - Continue IV Lasix - Follow daily  BMP - No urgent need for dialysis - Nephrology consulted.  Uncontrolled IDDM with renal complications - Patient seems to be on high-dose NovoLog alone at home - Start low-dose Lantus, mealtime NovoLog and SSI. - Monitor closely  OSA/COPD - Continue nightly  CPAP, oxygen and when necessary bronchodilator nebulizations.  Anemia - Likely secondary to chronic kidney disease - Follow daily CBCs  Chronic anticoagulation - Unclear indication. Need to further review-? Secondary to left femoral popliteal bypass graft. Continue Coumadin per pharmacy   Code Status: DO NOT RESUSCITATE  Family Communication: Discussed with daughter at bedside Disposition Plan: Home in medically stable   Time spent: 60 minutes  HONGALGI,ANAND, MD, FACP, FHM. [facility] Pager [phone]  If 7PM-7AM, please contact night-coverage [email or web address] Password [facility] 08/17/2013, 6:27 PM

## 2013-08-18 DIAGNOSIS — N184 Chronic kidney disease, stage 4 (severe): Secondary | ICD-10-CM

## 2013-08-18 DIAGNOSIS — Z01810 Encounter for preprocedural cardiovascular examination: Secondary | ICD-10-CM

## 2013-08-18 DIAGNOSIS — I739 Peripheral vascular disease, unspecified: Secondary | ICD-10-CM

## 2013-08-18 DIAGNOSIS — G4733 Obstructive sleep apnea (adult) (pediatric): Secondary | ICD-10-CM

## 2013-08-18 LAB — GLUCOSE, CAPILLARY
Glucose-Capillary: 294 mg/dL — ABNORMAL HIGH (ref 70–99)
Glucose-Capillary: 158 mg/dL — ABNORMAL HIGH (ref 70–99)
Glucose-Capillary: 157 mg/dL — ABNORMAL HIGH (ref 70–99)
Glucose-Capillary: 81 mg/dL (ref 70–99)

## 2013-08-18 LAB — TROPONIN I
Troponin I: <0.3 ng/mL (ref <0.30)
Troponin I: <0.3 ng/mL (ref <0.30)

## 2013-08-18 LAB — RENAL FUNCTION PANEL
Albumin: 2.2 g/dL — ABNORMAL LOW (ref 3.5–5.2)
Creatinine, Ser: 5 mg/dL — ABNORMAL HIGH (ref 0.50–1.10)
GFR calc Af Amer: 9 mL/min — ABNORMAL LOW (ref >90)
Glucose, Bld: 165 mg/dL — ABNORMAL HIGH (ref 70–99)
Phosphorus: 4.3 mg/dL (ref 2.3–4.6)
Potassium: 4.2 mEq/L (ref 3.5–5.1)
Sodium: 141 mEq/L (ref 135–145)

## 2013-08-18 LAB — IRON AND TIBC
Saturation Ratios: 16 % — ABNORMAL LOW (ref 20–55)
UIBC: 157 ug/dL (ref 125–400)

## 2013-08-18 LAB — PROTIME-INR
INR: 1.49 (ref 0.00–1.49)
Prothrombin Time: 17.6 seconds — ABNORMAL HIGH (ref 11.6–15.2)

## 2013-08-18 LAB — BASIC METABOLIC PANEL
BUN: 63 mg/dL — ABNORMAL HIGH (ref 6–23)
Chloride: 109 mEq/L (ref 96–112)
GFR calc Af Amer: 10 mL/min — ABNORMAL LOW (ref >90)
GFR calc non Af Amer: 8 mL/min — ABNORMAL LOW (ref >90)
Glucose, Bld: 246 mg/dL — ABNORMAL HIGH (ref 70–99)
Potassium: 4.4 mEq/L (ref 3.5–5.1)
Sodium: 140 mEq/L (ref 135–145)

## 2013-08-18 LAB — FERRITIN: Ferritin: 378 ng/mL — ABNORMAL HIGH (ref 10–291)

## 2013-08-18 MED ORDER — HEPARIN (PORCINE) IN NACL 100-0.45 UNIT/ML-% IJ SOLN
900.0000 [IU]/h | INTRAMUSCULAR | Status: DC
Start: 2013-08-18 — End: 2013-08-25
  Administered 2013-08-18: 800 [IU]/h via INTRAVENOUS
  Administered 2013-08-19 (×2): 950 [IU]/h via INTRAVENOUS
  Administered 2013-08-21 – 2013-08-23 (×3): 900 [IU]/h via INTRAVENOUS
  Administered 2013-08-23: 09:00:00 800 [IU]/h via INTRAVENOUS
  Administered 2013-08-24: 12:00:00 900 [IU]/h via INTRAVENOUS
  Filled 2013-08-18 (×7): qty 250

## 2013-08-18 MED ORDER — DARBEPOETIN ALFA-POLYSORBATE 100 MCG/0.5ML IJ SOLN
100.0000 ug | INTRAMUSCULAR | Status: DC
  Administered 2013-08-18: 100 ug via SUBCUTANEOUS
  Filled 2013-08-18 (×2): qty 0.5

## 2013-08-18 MED ORDER — WARFARIN SODIUM 7.5 MG PO TABS
7.5000 mg | ORAL_TABLET | Freq: Once | ORAL | Status: AC
  Administered 2013-08-18: 7.5 mg via ORAL
  Filled 2013-08-18: qty 1

## 2013-08-18 MED ORDER — CALCITRIOL 0.25 MCG PO CAPS
0.2500 ug | ORAL_CAPSULE | Freq: Every day | ORAL | Status: DC
  Administered 2013-08-18 – 2013-08-25 (×8): 0.25 ug via ORAL
  Filled 2013-08-18 (×9): qty 1

## 2013-08-18 NOTE — Progress Notes (Signed)
This RN obs pt Right post lower arm to hand red and warm, no swelling obs, pt c/o itching, RN notified MD, Nitro d/c, IV site CDI, flushes well with NS, pt with no complaints, pt refuses new IV at this time IV team called to assess site with possible restart

## 2013-08-18 NOTE — Progress Notes (Signed)
S: No N/V.  Breathing better, not even wearing O2 O:BP 158/52  Pulse 61  Temp(Src) 97.6 F (36.4 C) (Oral)  Resp 16  Ht 5\' 3"  (1.6 m)  Wt 67.8 kg (149 lb 7.6 oz)  BMI 26.48 kg/m2  SpO2 97%  Intake/Output Summary (Last 24 hours) at 08/18/13 0910 Last data filed at 08/18/13 0700  Gross per 24 hour  Intake 271.61 ml  Output   1400 ml  Net -1128.39 ml   Weight change:  ZOX:WRUEA and alert CVS:RRR Resp: bibasilar crackles Abd:+ BS NTND Ext: trace edema NEURO: CNI Ox3 No asterixis   . amLODipine  10 mg Oral Daily  . atorvastatin  40 mg Oral Daily  . carvedilol  12.5 mg Oral BID WC  . clopidogrel  75 mg Oral Daily  . ferrous sulfate  325 mg Oral BID  . folic acid  1 mg Oral Daily  . furosemide  120 mg Intravenous Q12H  . insulin aspart  0-5 Units Subcutaneous QHS  . insulin aspart  0-9 Units Subcutaneous TID WC  . insulin aspart  6 Units Subcutaneous TID WC  . insulin glargine  10 Units Subcutaneous QHS  . niacin  500 mg Oral QHS  . omega-3 acid ethyl esters  1 g Oral QID  . pantoprazole  40 mg Oral Daily  . pneumococcal 23 valent vaccine  0.5 mL Intramuscular Tomorrow-1000  . polyethylene glycol  17 g Oral Daily  . senna  1 tablet Oral QHS  . sodium chloride  3 mL Intravenous Q12H  . warfarin  7.5 mg Oral ONCE-1800  . Warfarin - Pharmacist Dosing Inpatient   Does not apply q1800   Dg Chest 2 View  08/17/2013   CLINICAL DATA:  The findings suggest congestive heart failure superimposed upon underlying COPD. No discrete focal pneumonia is demonstrated. Followup films following therapy would be useful to assure clearing.  EXAM: CHEST  2 VIEW  COMPARISON:  January 29, 2013.  FINDINGS: The lungs are mildly hyperinflated. There is stable nodularity in the right pulmonary apex. There are new small bilateral pleural effusions greater on the right than on the left. The interstitial markings are increased diffusely which is not entirely new but it is more conspicuous than in the past.  There is pleural based increased density laterally in the left mid thorax. The cardiac silhouette is top normal in size.  IMPRESSION: The findings are consistent with congestive heart failure superimposed upon COPD. Pleural based density laterally on the left is nonspecific and could reflect an area of early infiltrate. There is stable spiculated density in the right pulmonary apex likely reflecting scarring. Followup films are recommended following therapy to ensure clearing.   Electronically Signed   By: David  [place]   On: 08/17/2013 16:52   BMET    Component Value Date/Time   NA 141 08/18/2013 0452   K 4.2 08/18/2013 0452   CL 109 08/18/2013 0452   CO2 19 08/18/2013 0452   GLUCOSE 165* 08/18/2013 0452   BUN 63* 08/18/2013 0452   CREATININE 5.00* 08/18/2013 0452   CALCIUM 8.4 08/18/2013 0452   CALCIUM 8.7 07/01/2011 1831   GFRNONAA 8* 08/18/2013 0452   GFRAA 9* 08/18/2013 0452   CBC    Component Value Date/Time   WBC 7.9 08/17/2013 2343   RBC 2.86* 08/17/2013 2343   HGB 7.8* 08/17/2013 2343   HCT 23.7* 08/17/2013 2343   PLT 216 08/17/2013 2343   MCV 82.9 08/17/2013 2343   MCH 27.3  08/17/2013 2343   MCHC 32.9 08/17/2013 2343   RDW 14.7 08/17/2013 2343   LYMPHSABS 2.9 06/21/2011 0612   MONOABS 0.5 06/21/2011 0612   EOSABS 0.4 06/21/2011 0612   BASOSABS 0.0 06/21/2011 0612     Assessment: 1. Acute on CKD 4 2. Volume overload, diuresed fairly well over night 3 Anemia 4 DM 5. HTN   Plan: 1. Cont IV lasix 2 Give dose of aranesp 3. Await PTH level 4. Daily SCR   Kanetra Ho T

## 2013-08-18 NOTE — Progress Notes (Addendum)
ANTICOAGULATION CONSULT NOTE - Initial Consult  Pharmacy Consult:  Heparin Indication:  Severe PVD  Allergies  Allergen Reactions  . Ivp Dye [Iodinated Diagnostic Agents]   . Aspirin Hives    Patient Measurements: Height: 5\' 3"  (160 cm) Weight: 149 lb 7.6 oz (67.8 kg) IBW/kg (Calculated) : 52.4 Heparin Dosing Weight:  66 kg  Vital Signs: Temp: 98.4 F (36.9 C) (11/05 1630) Temp src: Oral (11/05 1630) BP: 161/53 mmHg (11/05 1630) Pulse Rate: 60 (11/05 1630)  Labs:  Recent Labs  08/17/13 1603 08/17/13 2000 08/17/13 2343 08/18/13 0452 08/18/13 0701  HGB 8.1*  --  7.8*  --   --   HCT 24.2*  --  23.7*  --   --   PLT 240  --  216  --   --   LABPROT 17.6*  --   --  17.6*  --   INR 1.49  --   --  1.49  --   CREATININE 4.89*  --  4.83* 5.00*  --   TROPONINI  --  <0.30 <0.30  --  <0.30    Estimated Creatinine Clearance: 9.5 ml/min (by C-G formula based on Cr of 5).   Medical History: Past Medical History  Diagnosis Date  . CAD (coronary artery disease)   . Hypertension   . Diabetes mellitus     type 2 insulin dependent  . Hyperlipidemia   . CHF (congestive heart failure)   . Cellulitis     left foot  . Ulcer     diabetic ulcer on left foot  . DVT (deep venous thrombosis)     peroneal vein  . Peripheral vascular disease   . Chronic kidney disease   . Anemia        Assessment: 21 YOF known to Pharmacy from Coumadin dosing for PVD.  IV heparin bridge to start and continue until INR is therapeutic.  No bleeding reported.   Goal of Therapy:  Heparin level 0.3-0.7 units/ml Monitor platelets by anticoagulation protocol: Yes    Plan:  - Heparin gtt at 800 units/hr, no bolus as INR is 1.49 - Check 8 hr HL - Daily HL / CBC - F/U INR and d/c heparin once INR is therapeutic    Ailis Rigaud D. Laney Potash, PharmD, BCPS Pager:  [phone] 08/18/2013, 5:08 PM

## 2013-08-18 NOTE — Progress Notes (Signed)
Placed patient on CPAP via auto-mode for the night. Maximum pressure set at 20cm with minimum pressure set at 4cm

## 2013-08-18 NOTE — Progress Notes (Signed)
ANTICOAGULATION CONSULT NOTE   Pharmacy Consult:  Coumadin Indication:  PVD  Allergies  Allergen Reactions  . Ivp Dye [Iodinated Diagnostic Agents]   . Aspirin Hives    Labs:  Recent Labs  08/17/13 1603 08/17/13 2000 08/17/13 2343 08/18/13 0452 08/18/13 0701  HGB 8.1*  --  7.8*  --   --   HCT 24.2*  --  23.7*  --   --   PLT 240  --  216  --   --   LABPROT 17.6*  --   --  17.6*  --   INR 1.49  --   --  1.49  --   CREATININE 4.89*  --  4.83* 5.00*  --   TROPONINI  --  <0.30 <0.30  --  <0.30    Estimated Creatinine Clearance: 9.5 ml/min (by C-G formula based on Cr of 5).   Assessment: 38 YOF with history of CHF admitted with complaint of dyspnea.  Pharmacy consulted to manage Coumadin, continued from home.  Based on previous record, patient underwent fem-pop bypass graft for PVD in September of 2012 and was started on Coumadin.  Later that month, she was diagnosed with peroneal vein DVT.  Patient's INR is sub-therapeutic and she reports taking Coumadin 6mg  PO this AM.  No bleeding reported.  ?Compliance with Coumadin -- INR = 1.49 today   Goal of Therapy:  INR 2-3 Monitor platelets by anticoagulation protocol: Yes    Plan:  - Coumadin 7.5 mg po x 1 today - Daily PT / INR  Thank you. Okey Regal, PharmD [phone]  08/18/2013, 8:57 AM

## 2013-08-18 NOTE — Progress Notes (Signed)
IV team called to assess current IV site. She has a right posterior forearm IV site. Arrived to find that the patients right hand appears to have a petechial rash which pt states started while she was being transfused a certain medication.  She states that she also had itching to the hand during the infusion.  Currently nothing is infusing through the right posterior forearm IV site and the patient does not report to have any itching.  The IV sight was assessed for infiltration; flushed with NS no signs of infiltration pt denies having any discomfort.  Offered to restart the IV sight however, the pt refuses another IV start at this time. [name]

## 2013-08-18 NOTE — Progress Notes (Signed)
[facility] Progress Note [place] TEAM 1 - Stepdown/ICU TEAM   [name] MVH:[id number] DOB: 01-30-1941 DOA: 08/17/2013 PCP: Michiel Sites, MD  Admit HPI / Brief Narrative: 72 y.o. female with history of CAD, CABG, dyslipidemia, hypertension, IDDM, stage IV chronic kidney disease, chronic lung scarring on CT chest, ischemic cardiomyopathy with EF 44%, OSA on CPAP, COPD, on Coumadin anticoagulation who presented to the ED with worsening dyspnea. Patient claimed compliance with medications but not compliant with salt restricted diet. She gave 3 day history of progressively worsening dyspnea on exertion, orthopnea, PND. She denied chest pain or worsening leg edema. She stated that walking even a few steps brought on dyspnea. She only is able to sleep for 2 hours at night and has to wakeup secondary to dyspnea.  In the ED, chest x-ray suggestive of CHF, creatinine worse >4 which was 2.8 in April. In reviewing her home med rec: was on Lasix only when necessary and seemed to be on dual ACE inhibitors. Patient has been started on IV nitroglycerin drip and hospitalist admission requested.  Assessment/Plan:  Acute on chronic systolic CHF / ischemic cardiomyopathy / CAD / CABG 2009 Cont diuresis - follow renal function - strict Is/Os and daily weights  Accelerated hypertension BP control improved, but not yet at desired BP - careful titration of meds to continue - cont diuresis   Nitro infiltration into R forearm / hand  Asked nurse to follow establish protocol for IV infiltration of this medication - exam is benign at this time but we will need to follow this serially  Acute on stage IV chronic kidney disease In April 2014 was 2.8 - Nephrology following with [name]  Uncontrolled IDDM with renal complications CBG currently reasonably controlled - no change in treatment plan today  OSA Continue nightly CPAP regimen  COPD Well compensated at the present time  Anemia Likely  secondary to chronic kidney disease - follow trend  Chronic anticoagulation - PVD + hx DVT INR is not at goal - initiate heparin gtt until INR at goal  PVD s/p left femoral-popliteal bypass graft Sept 2012  Code Status: NO CODE Family Communication: no family present at time of exam Disposition Plan: SDU due to risk for acute resp decline  Consultants: Nephrology   Procedures: None  Antibiotics: None  DVT prophylaxis: Heparin  HPI/Subjective: The patient states that her breathing is still quite short.  She denies present chest pain.  She denies pain in her right hand.  She denies fevers chills nausea vomiting or abdominal pain.  Objective: Blood pressure 166/55, pulse 61, temperature 97.9 F (36.6 C), temperature source Oral, resp. rate 16, height 5\' 3"  (1.6 m), weight 67.8 kg (149 lb 7.6 oz), SpO2 97.00%.  Intake/Output Summary (Last 24 hours) at 08/18/13 1452 Last data filed at 08/18/13 1310  Gross per 24 hour  Intake 631.61 ml  Output   1800 ml  Net -1168.39 ml   Exam: General: No acute respiratory distress at rest Lungs: Mild diffuse crackles without active wheeze Cardiovascular: Regular rate and rhythm without murmur gallop or rub normal S1 and S2 Abdomen: Nontender, nondistended, soft, bowel sounds positive, no rebound, no ascites, no appreciable mass Extremities: No significant cyanosis, clubbing; 1+ edema bilateral lower extremities  Data Reviewed: Basic Metabolic Panel:  Recent Labs Lab 08/17/13 1603 08/17/13 2343 08/18/13 0452  NA 137 140 141  K 4.9 4.4 4.2  CL 105 109 109  CO2 21 19 19   GLUCOSE 337* 246* 165*  BUN  61* 63* 63*  CREATININE 4.89* 4.83* 5.00*  CALCIUM 8.1* 8.3* 8.4  PHOS  --   --  4.3   Liver Function Tests:  Recent Labs Lab 08/18/13 0452  ALBUMIN 2.2*   CBC:  Recent Labs Lab 08/17/13 1603 08/17/13 2343  WBC 6.6 7.9  HGB 8.1* 7.8*  HCT 24.2* 23.7*  MCV 83.2 82.9  PLT 240 216   Cardiac Enzymes:  Recent Labs Lab  08/17/13 2000 08/17/13 2343 08/18/13 0701  TROPONINI <0.30 <0.30 <0.30   BNP (last 3 results)  Recent Labs  08/17/13 1603  PROBNP 28521.0*   CBG:  Recent Labs Lab 08/17/13 2151 08/18/13 0756 08/18/13 1137  GLUCAP 294* 158* 157*    Recent Results (from the past 240 hour(s))  MRSA PCR SCREENING     Status: None   Collection Time    08/17/13  7:05 PM      Result Value Range Status   MRSA by PCR NEGATIVE  NEGATIVE Final   Comment:            The GeneXpert MRSA Assay (FDA     approved for NASAL specimens     only), is one component of a     comprehensive MRSA colonization     surveillance program. It is not     intended to diagnose MRSA     infection nor to guide or     monitor treatment for     MRSA infections.     Studies:  Recent x-ray studies have been reviewed in detail by the Attending Physician  Scheduled Meds:  Scheduled Meds: . amLODipine  10 mg Oral Daily  . atorvastatin  40 mg Oral Daily  . calcitRIOL  0.25 mcg Oral Daily  . carvedilol  12.5 mg Oral BID WC  . clopidogrel  75 mg Oral Daily  . darbepoetin (ARANESP) injection - NON-DIALYSIS  100 mcg Subcutaneous Q Wed-1800  . ferrous sulfate  325 mg Oral BID  . folic acid  1 mg Oral Daily  . furosemide  120 mg Intravenous Q12H  . insulin aspart  0-5 Units Subcutaneous QHS  . insulin aspart  0-9 Units Subcutaneous TID WC  . insulin aspart  6 Units Subcutaneous TID WC  . insulin glargine  10 Units Subcutaneous QHS  . niacin  500 mg Oral QHS  . omega-3 acid ethyl esters  1 g Oral QID  . pantoprazole  40 mg Oral Daily  . pneumococcal 23 valent vaccine  0.5 mL Intramuscular Tomorrow-1000  . polyethylene glycol  17 g Oral Daily  . senna  1 tablet Oral QHS  . sodium chloride  3 mL Intravenous Q12H  . warfarin  7.5 mg Oral ONCE-1800  . Warfarin - Pharmacist Dosing Inpatient   Does not apply q1800    Time spent on care of this patient: 35 mins   [facility] T  [facility] Office   [phone] Pager - Text Page per Loretha Stapler as per below:  On-Call/Text Page:      [email or web address]      password [email or web address]  If 7PM-7AM, please contact night-coverage [email or web address] Password [email or web address] 08/18/2013, 2:52 PM   LOS: 1 day

## 2013-08-18 NOTE — Plan of Care (Signed)
Problem: Consults Goal: ARF/New Onset CRF Patient Education See Patient Education Module for education specifics. Outcome: Progressing Living with kidney failure book given to patient.

## 2013-08-18 NOTE — Progress Notes (Signed)
Blood pressure remains elevated 194/58 NP Tama Gander notified.New orders received to increase IV nitroglycerin to 77mcg/kg/min and give dose of coreg tonight.Orders carried out will continue to monitor patient.

## 2013-08-18 NOTE — Progress Notes (Signed)
Utilization review completed.  

## 2013-08-18 NOTE — Progress Notes (Signed)
Tama Gander NP notified of blood pressure 192/66 .Received order to continue IV nitroglycerin at 39mcg/kg/min. Will continue to monitor.

## 2013-08-18 NOTE — Progress Notes (Signed)
VASCULAR LAB PRELIMINARY  PRELIMINARY  PRELIMINARY  PRELIMINARY   Right  Upper Extremity Vein Map    Cephalic  Segment Diameter Depth Comment  1. Axilla 4.83mm mm   2. Mid upper arm 4.86mm mm branch  3. Above AC 4.72mm mm   4. In AC 4.25mm mm   5. Below AC 3.35mm mm branch  6. Mid forearm 3.52mm mm   7. Wrist 2.53mm mm    mm mm    mm mm    mm mm      Left Upper Extremity Vein Map    Cephalic  Segment Diameter Depth Comment  1. Axilla 3.25mm mm   2. Mid upper arm 3.73mm mm branch  3. Above AC 3.54mm mm   4. In [facility] 4.3mm mm   5. Below AC 3.71mm mm branch  6. Mid forearm 3mm mm   7. Wrist 2.86mm mm    mm mm    mm mm    mm mm     Farrel Demark, RDMS, RVT  08/18/2013, 10:05 AM

## 2013-08-18 NOTE — Progress Notes (Signed)
Nutrition Brief Note  Patient identified on the Malnutrition Screening Tool (MST) Report for weight loss. Patient reports some weight loss related to fluids/HD. She has been eating well at home and since admission.  Wt Readings from Last 15 Encounters:  08/18/13 149 lb 7.6 oz (67.8 kg)  03/30/13 157 lb 11.2 oz (71.532 kg)  03/02/13 157 lb 6.4 oz (71.396 kg)  01/13/13 164 lb (74.39 kg)  05/13/12 170 lb 4.8 oz (77.248 kg)  10/29/11 166 lb (75.297 kg)  09/03/11 154 lb (69.854 kg)  08/05/11 154 lb 9.6 oz (70.126 kg)  07/16/11 157 lb (71.215 kg)    Body mass index is 26.48 kg/(m^2). Patient meets criteria for overweight based on current BMI.   Current diet order is Renal 80/90-2-2, patient is consuming approximately 100% of meals at this time. Labs and medications reviewed.   No nutrition interventions warranted at this time. If nutrition issues arise, please consult RD.   Joaquin Courts, RD, LDN, CNSC Pager [phone] After Hours Pager [phone]

## 2013-08-19 ENCOUNTER — Encounter: Payer: Self-pay | Admitting: Vascular Surgery

## 2013-08-19 DIAGNOSIS — N184 Chronic kidney disease, stage 4 (severe): Secondary | ICD-10-CM

## 2013-08-19 LAB — BASIC METABOLIC PANEL
Calcium: 8.6 mg/dL (ref 8.4–10.5)
Creatinine, Ser: 5.17 mg/dL — ABNORMAL HIGH (ref 0.50–1.10)
GFR calc Af Amer: 9 mL/min — ABNORMAL LOW (ref >90)
GFR calc non Af Amer: 8 mL/min — ABNORMAL LOW (ref >90)
Glucose, Bld: 97 mg/dL (ref 70–99)
Sodium: 137 mEq/L (ref 135–145)

## 2013-08-19 LAB — CBC
Hemoglobin: 8 g/dL — ABNORMAL LOW (ref 12.0–15.0)
RBC: 2.95 MIL/uL — ABNORMAL LOW (ref 3.87–5.11)
RDW: 14.6 % (ref 11.5–15.5)
WBC: 7.8 10*3/uL (ref 4.0–10.5)

## 2013-08-19 LAB — GLUCOSE, CAPILLARY
Glucose-Capillary: 67 mg/dL — ABNORMAL LOW (ref 70–99)
Glucose-Capillary: 289 mg/dL — ABNORMAL HIGH (ref 70–99)
Glucose-Capillary: 111 mg/dL — ABNORMAL HIGH (ref 70–99)

## 2013-08-19 LAB — PROTIME-INR
INR: 1.67 — ABNORMAL HIGH (ref 0.00–1.49)
Prothrombin Time: 19.2 seconds — ABNORMAL HIGH (ref 11.6–15.2)

## 2013-08-19 MED ORDER — FUROSEMIDE 10 MG/ML IJ SOLN
160.0000 mg | Freq: Three times a day (TID) | INTRAVENOUS | Status: DC
  Administered 2013-08-19 (×3): 160 mg via INTRAVENOUS
  Filled 2013-08-19 (×6): qty 16

## 2013-08-19 MED ORDER — CLONIDINE HCL 0.1 MG PO TABS
0.1000 mg | ORAL_TABLET | Freq: Two times a day (BID) | ORAL | Status: DC
  Administered 2013-08-19 – 2013-08-25 (×13): 0.1 mg via ORAL
  Filled 2013-08-19 (×14): qty 1

## 2013-08-19 MED ORDER — SODIUM CHLORIDE 0.9 % IV SOLN
1020.0000 mg | Freq: Once | INTRAVENOUS | Status: AC
  Administered 2013-08-19: 1020 mg via INTRAVENOUS
  Filled 2013-08-19: qty 34

## 2013-08-19 NOTE — Progress Notes (Addendum)
Placed patient on CPAP via full face mask, auto titrate settings (max 20.0-min 4.0 cmH20)   Pt. Tolerating well at this time. RN aware.

## 2013-08-19 NOTE — Progress Notes (Signed)
Hypoglycemic Event  CBG: 67  Treatment: 15 GM carbohydrate snack  Symptoms: Sweaty  Follow-up CBG: Time:0140 CBG Result:97  Possible Reasons for Event: Medication regimen: lantus  Comments/MD notified:no     Anthony Tamburo A  Remember to initiate Hypoglycemia Order Set & complete

## 2013-08-19 NOTE — Progress Notes (Signed)
ANTICOAGULATION CONSULT NOTE - Follow Up Consult  Pharmacy Consult for heparin  Indication: severe PVD  Allergies  Allergen Reactions  . Ivp Dye [Iodinated Diagnostic Agents]   . Aspirin Hives    Labs:  Recent Labs  08/17/13 1603 08/17/13 2000 08/17/13 2343 08/18/13 0452 08/18/13 0701 08/19/13 0143 08/19/13 1100  HGB 8.1*  --  7.8*  --   --  8.0*  --   HCT 24.2*  --  23.7*  --   --  24.5*  --   PLT 240  --  216  --   --  226  --   LABPROT 17.6*  --   --  17.6*  --  19.2*  --   INR 1.49  --   --  1.49  --  1.67*  --   HEPARINUNFRC  --   --   --   --   --  0.25* 0.32  CREATININE 4.89*  --  4.83* 5.00*  --  5.17*  --   TROPONINI  --  <0.30 <0.30  --  <0.30  --   --     Estimated Creatinine Clearance: 9.2 ml/min (by C-G formula based on Cr of 5.17).  Assessment: Heparin level now therapeutic.  Coumadin on hold awaiting access plans.   Goal of Therapy:  Heparin level 0.3-0.7 units/ml Monitor platelets by anticoagulation protocol: Yes   Plan:  Continue heparin at 950 units / hr Follow up AM labs  Thank you. Okey Regal, PharmD  08/19/2013,12:43 PM

## 2013-08-19 NOTE — Consult Note (Signed)
[facility] [place]  Referred by:  Dr. Briant Cedar  Reason for referral: New access  History of Present Illness  [name] is a 72 y.o. (1940/10/31) female who presents for evaluation for permanent access.  The patient is right hand dominant.  The patient has not had previous access procedures.  Previous central venous cannulation procedures include: none.  The patient has never had a PPM placed.  Pt previously had a Lfem-BK pop bypass placed by Dr. Hart Rochester (06/26/11).  Past Medical History  Diagnosis Date  . CAD (coronary artery disease)   . Hypertension   . Diabetes mellitus     type 2 insulin dependent  . Hyperlipidemia   . CHF (congestive heart failure)   . Cellulitis     left foot  . Ulcer     diabetic ulcer on left foot  . DVT (deep venous thrombosis)     peroneal vein  . Peripheral vascular disease   . Chronic kidney disease   . Anemia     Past Surgical History  Procedure Laterality Date  . Tubal ligation    . Pr vein bypass graft,aorto-fem-pop  06-26-11    1. PATENT LEFT FEMORAL-POPLITEAL BYPASS GRAFT W/NO EVEIDENCE OF STENOSIS. 2.VELOCITIES OF GREATER THAN 200 CM/'s NOTED ON PREVIOUS EXAM 10/03/11 WERE NOT ADEQUATELY VISULAIZED DURING THIS EXAM  . Coronary artery bypass graft  04/25/08    Dr. Tyrone Sage, CABG x 5   . Nm myoview ltd  01/13/13    LEXISCAN; LV WALL MOTION: LVEF 44%, INFERIOR AKINESIS, ANTEROAPICAL HYPOKINESIS  . Cardiac catheterization  04/22/08    SEVERE 3-VESSEL DISEASE CAD., CABG X 5 04/25/08 WITH DR. Tyrone Sage    History   Social History  . Marital Status: Legally Separated    Spouse Name: N/A    Number of Children: 5  . Years of Education: 10   Occupational History  . RETIRED    Social History Main Topics  . Smoking status: Former Smoker -- 2.00 packs/day for 20 years    Types: Cigarettes    Quit date: 07/15/1996  . Smokeless tobacco: Former Neurosurgeon    Types: Snuff  . Alcohol Use: No  . Drug Use: No  . Sexual Activity:  Not on file   Other Topics Concern  . Not on file   Social History Narrative  . No narrative on file    Family History  Problem Relation Age of Onset  . Cancer Mother     Kidney  . Kidney disease Mother   . Cancer Father     skin    No current facility-administered medications on file prior to encounter.   Current Outpatient Prescriptions on File Prior to Encounter  Medication Sig Dispense Refill  . amLODipine (NORVASC) 5 MG tablet Take 10 mg by mouth daily.       [name] atorvastatin (LIPITOR) 40 MG tablet Take 40 mg by mouth daily.        . carvedilol (COREG) 12.5 MG tablet Take 12.5 mg by mouth 2 (two) times daily with a meal.        . clopidogrel (PLAVIX) 75 MG tablet Take 75 mg by mouth daily.        . digoxin (LANOXIN) 0.125 MG tablet Take 125 mcg by mouth daily.        . ferrous sulfate 325 (65 FE) MG tablet Take 325 mg by mouth 2 (two) times daily.        . folic acid (FOLVITE) 1  MG tablet Take 1 mg by mouth daily.        . furosemide (LASIX) 40 MG tablet Take 40 mg by mouth daily as needed for fluid.       [name] insulin aspart (NOVOLOG) 100 UNIT/ML injection Inject 20-26 Units into the skin 3 (three) times daily before meals. Take 20 units at breakfast and lunch. Take 26 units at dinner      . isosorbide mononitrate (IMDUR) 30 MG 24 hr tablet Take 0.5 tablets (15 mg total) by mouth daily.  90 tablet  3  . lisinopril (PRINIVIL,ZESTRIL) 40 MG tablet Take 40 mg by mouth daily.        . niacin (NIASPAN) 500 MG CR tablet Take 500 mg by mouth at bedtime.        . nitroGLYCERIN (NITROSTAT) 0.4 MG SL tablet Place 0.4 mg under the tongue every 5 (five) minutes as needed for chest pain.      [name] omega-3 acid ethyl esters (LOVAZA) 1 G capsule Take 1 g by mouth 4 (four) times daily.       [name] omeprazole (PRILOSEC) 20 MG capsule Take 20 mg by mouth daily.        . polyethylene glycol (MIRALAX / GLYCOLAX) packet Take 17 g by mouth daily.        . ramipril (ALTACE) 10 MG tablet Take 10 mg by mouth  daily.      [name] senna (SENOKOT) 8.6 MG tablet Take 1 tablet by mouth at bedtime.      [name] warfarin (COUMADIN) 4 MG tablet Take 4-6 mg by mouth. TAKES 1 TABLET (4 MG) MON, WEDS, FRI & SUN.  TAKES 1.5 TABLET (6 MG) TUES, THURS, SAT.        Allergies  Allergen Reactions  . Ivp Dye [Iodinated Diagnostic Agents]   . Aspirin Hives    REVIEW OF SYSTEMS:  (Positives checked otherwise negative)  CARDIOVASCULAR:  []  chest pain, []  chest pressure, []  palpitations, [x]  shortness of breath when laying flat, [x]  shortness of breath with exertion,  []  pain in feet when walking, []  pain in feet when laying flat, []  history of blood clot in veins (DVT), []  history of phlebitis, [x]  swelling in legs, []  varicose veins  PULMONARY:  []  productive cough, []  asthma, []  wheezing  NEUROLOGIC:  []  weakness in arms or legs, []  numbness in arms or legs, []  difficulty speaking or slurred speech, []  temporary loss of vision in one eye, []  dizziness  HEMATOLOGIC:  []  bleeding problems, []  problems with blood clotting too easily  MUSCULOSKEL:  []  joint pain, []  joint swelling  GASTROINTEST:  []  vomiting blood, []  blood in stool     GENITOURINARY:  []  burning with urination, []  blood in urine  PSYCHIATRIC:  []  history of major depression  INTEGUMENTARY:  []  rashes, [x]  h/o ulcers  CONSTITUTIONAL:  []  fever, []  chills   Physical Examination  Filed Vitals:   08/19/13 0214 08/19/13 0319 08/19/13 0320 08/19/13 0800  BP:   168/44 177/52  Pulse:   62   Temp:   98.6 F (37 C)   TempSrc:   Oral   Resp:   17   Height:      Weight:  150 lb 5.7 oz (68.2 kg)    SpO2: 97%  94%    Body mass index is 26.64 kg/(m^2).  General: A&O x 3, WD, elderly  Head: [place]/AT  Ear/Nose/Throat: Hearing grossly intact, nares w/o erythema or drainage, oropharynx w/o Erythema/Exudate, Mallampati score:  3  Eyes: PERRLA, EOMI, arcus senilus  Neck: Supple, no nuchal rigidity, no palpable LAD  Pulmonary: Sym exp, good air movt,  CTAB, no rales, rhonchi, & wheezing  Cardiac: RRR, Nl S1, S2, no Murmurs, rubs or gallops  Vascular: Vessel Right Left  Radial Difficult to Palpate Palpable  Ulnar Not Palpable Not Palpable  Brachial Palpable Palpable  Carotid Palpable, without bruit Palpable, without bruit  Aorta Not palpable N/A  Femoral Palpable Palpable  Popliteal Not palpable Not palpable  PT Not Palpable Not Palpable  DP Not Palpable Faintly Palpable   Gastrointestinal: soft, NTND, -G/R, - HSM, - masses, - CVAT B  Musculoskeletal: M/S 5/5 throughout except R hand grip which cannot be fully tested due to pain with active grip, Extremities without ischemic changes , healed incision in LLE, warm feet, with chronic venous changes, visible cephalic vein at antecubitum, somewhat hyperemic right hand  Neurologic: CN 2-12 intact , Pain and light touch intact in extremities , Motor exam as listed above  Psychiatric: Judgment intact, Mood & affect appropriate for pt's clinical situation  Dermatologic: See M/S exam for extremity exam, no rashes otherwise noted  Lymph : No Cervical, Axillary, or Inguinal lymphadenopathy   Non-Invasive Vascular Imaging  Vein Mapping  (Date: 08/19/2013):   R arm: acceptable vein conduits include upper arm cephalic vein  L arm: acceptable vein conduits include entire cephalic vein with somewhat marginal distal vein (2.8 mm)  Radiology: Dg Chest 2 View  08/17/2013   CLINICAL DATA:  The findings suggest congestive heart failure superimposed upon underlying COPD. No discrete focal pneumonia is demonstrated. Followup films following therapy would be useful to assure clearing.  EXAM: CHEST  2 VIEW  COMPARISON:  January 29, 2013.  FINDINGS: The lungs are mildly hyperinflated. There is stable nodularity in the right pulmonary apex. There are new small bilateral pleural effusions greater on the right than on the left. The interstitial markings are increased diffusely which is not entirely new but  it is more conspicuous than in the past. There is pleural based increased density laterally in the left mid thorax. The cardiac silhouette is top normal in size.  IMPRESSION: The findings are consistent with congestive heart failure superimposed upon COPD. Pleural based density laterally on the left is nonspecific and could reflect an area of early infiltrate. There is stable spiculated density in the right pulmonary apex likely reflecting scarring. Followup films are recommended following therapy to ensure clearing.   Electronically Signed   By: David  [place]   On: 08/17/2013 16:52   Laboratory: CBC:    Component Value Date/Time   WBC 7.8 08/19/2013 0143   RBC 2.95* 08/19/2013 0143   HGB 8.0* 08/19/2013 0143   HCT 24.5* 08/19/2013 0143   PLT 226 08/19/2013 0143   MCV 83.1 08/19/2013 0143   MCH 27.1 08/19/2013 0143   MCHC 32.7 08/19/2013 0143   RDW 14.6 08/19/2013 0143   LYMPHSABS 2.9 06/21/2011 0612   MONOABS 0.5 06/21/2011 0612   EOSABS 0.4 06/21/2011 0612   BASOSABS 0.0 06/21/2011 0612    BMP:    Component Value Date/Time   NA 137 08/19/2013 0143   K 3.7 08/19/2013 0143   CL 105 08/19/2013 0143   CO2 19 08/19/2013 0143   GLUCOSE 97 08/19/2013 0143   BUN 61* 08/19/2013 0143   CREATININE 5.17* 08/19/2013 0143   CALCIUM 8.6 08/19/2013 0143   CALCIUM 8.7 07/01/2011 1831   GFRNONAA 8* 08/19/2013 0143   GFRAA 9* 08/19/2013 0143  Coagulation: Lab Results  Component Value Date   INR 1.67* 08/19/2013   INR 1.49 08/18/2013   INR 1.49 08/17/2013   No results found for this basename: PTT   Medical Decision Making  [name] is a 72 y.o. female who presents with acute on chronic kidney disease stage IV, improving CHF, multiple co-morbidities including PAD s/p L fem-BK BPG   Based on vein mapping and examination, this patient's permanent access options include: R BC AVF, L RC AVF, L BC AVF.  I would start with a L RC vs BC AVF.  Pt was told today she would not need hemodialysis, so she is not  interested in access placement until she discusses the issue again with her nephrologist.  We are available to place her L arm AVF once she is interested in proceeding.Leonides Sake, MD [facility] [place] Office: [phone] Pager: [phone]  08/19/2013, 1:33 PM

## 2013-08-19 NOTE — Progress Notes (Signed)
ANTICOAGULATION CONSULT NOTE - Follow Up Consult  Pharmacy Consult for heparin  Indication: severe PVD  Allergies  Allergen Reactions  . Ivp Dye [Iodinated Diagnostic Agents]   . Aspirin Hives    Patient Measurements: Height: 5\' 3"  (160 cm) Weight: 149 lb 7.6 oz (67.8 kg) IBW/kg (Calculated) : 52.4 Heparin Dosing Weight:   Vital Signs: Temp: 98.1 F (36.7 C) (11/06 0011) Temp src: Oral (11/06 0011) BP: 168/57 mmHg (11/06 0011) Pulse Rate: 60 (11/06 0011)  Labs:  Recent Labs  08/17/13 1603 08/17/13 2000 08/17/13 2343 08/18/13 0452 08/18/13 0701 08/19/13 0143  HGB 8.1*  --  7.8*  --   --  8.0*  HCT 24.2*  --  23.7*  --   --  24.5*  PLT 240  --  216  --   --  226  LABPROT 17.6*  --   --  17.6*  --  19.2*  INR 1.49  --   --  1.49  --  1.67*  HEPARINUNFRC  --   --   --   --   --  0.25*  CREATININE 4.89*  --  4.83* 5.00*  --  5.17*  TROPONINI  --  <0.30 <0.30  --  <0.30  --     Estimated Creatinine Clearance: 9.2 ml/min (by C-G formula based on Cr of 5.17).     Assessment: Heparin is slightly subtherapeutic at 0.25 h/h and platelets remain stable.  Goal of Therapy:  Heparin level 0.3-0.7 units/ml Monitor platelets by anticoagulation protocol: Yes   Plan:  Increase rate to 950 units/hr and recheck HL in 8 hours  Janice Coffin 08/19/2013,3:08 AM

## 2013-08-19 NOTE — Progress Notes (Signed)
BP 168/57, HR 60. K. Schorr notified. No new orders received. Will continue to monitor.

## 2013-08-19 NOTE — Progress Notes (Signed)
S: No N/V.  Breathing better, not even wearing O2 O:BP 177/52  Pulse 62  Temp(Src) 98.6 F (37 C) (Oral)  Resp 17  Ht 5\' 3"  (1.6 m)  Wt 68.2 kg (150 lb 5.7 oz)  BMI 26.64 kg/m2  SpO2 94%  Intake/Output Summary (Last 24 hours) at 08/19/13 0847 Last data filed at 08/19/13 0726  Gross per 24 hour  Intake    864 ml  Output   1950 ml  Net  -1086 ml   Weight change: 1.068 kg (2 lb 5.7 oz) ZOX:WRUEA and alert CVS:RRR Resp: bibasilar crackles Abd:+ BS NTND Ext: trace edema NEURO: CNI Ox3 No asterixis   . amLODipine  10 mg Oral Daily  . atorvastatin  40 mg Oral Daily  . calcitRIOL  0.25 mcg Oral Daily  . carvedilol  12.5 mg Oral BID WC  . clopidogrel  75 mg Oral Daily  . darbepoetin (ARANESP) injection - NON-DIALYSIS  100 mcg Subcutaneous Q Wed-1800  . ferrous sulfate  325 mg Oral BID  . folic acid  1 mg Oral Daily  . furosemide  120 mg Intravenous Q12H  . insulin aspart  0-5 Units Subcutaneous QHS  . insulin aspart  0-9 Units Subcutaneous TID WC  . insulin aspart  6 Units Subcutaneous TID WC  . insulin glargine  10 Units Subcutaneous QHS  . niacin  500 mg Oral QHS  . omega-3 acid ethyl esters  1 g Oral QID  . pantoprazole  40 mg Oral Daily  . pneumococcal 23 valent vaccine  0.5 mL Intramuscular Tomorrow-1000  . polyethylene glycol  17 g Oral Daily  . senna  1 tablet Oral QHS  . sodium chloride  3 mL Intravenous Q12H  . Warfarin - Pharmacist Dosing Inpatient   Does not apply q1800   Dg Chest 2 View  08/17/2013   CLINICAL DATA:  The findings suggest congestive heart failure superimposed upon underlying COPD. No discrete focal pneumonia is demonstrated. Followup films following therapy would be useful to assure clearing.  EXAM: CHEST  2 VIEW  COMPARISON:  January 29, 2013.  FINDINGS: The lungs are mildly hyperinflated. There is stable nodularity in the right pulmonary apex. There are new small bilateral pleural effusions greater on the right than on the left. The interstitial  markings are increased diffusely which is not entirely new but it is more conspicuous than in the past. There is pleural based increased density laterally in the left mid thorax. The cardiac silhouette is top normal in size.  IMPRESSION: The findings are consistent with congestive heart failure superimposed upon COPD. Pleural based density laterally on the left is nonspecific and could reflect an area of early infiltrate. There is stable spiculated density in the right pulmonary apex likely reflecting scarring. Followup films are recommended following therapy to ensure clearing.   Electronically Signed   By: David  [place]   On: 08/17/2013 16:52   BMET    Component Value Date/Time   NA 137 08/19/2013 0143   K 3.7 08/19/2013 0143   CL 105 08/19/2013 0143   CO2 19 08/19/2013 0143   GLUCOSE 97 08/19/2013 0143   BUN 61* 08/19/2013 0143   CREATININE 5.17* 08/19/2013 0143   CALCIUM 8.6 08/19/2013 0143   CALCIUM 8.7 07/01/2011 1831   GFRNONAA 8* 08/19/2013 0143   GFRAA 9* 08/19/2013 0143   CBC    Component Value Date/Time   WBC 7.8 08/19/2013 0143   RBC 2.95* 08/19/2013 0143   HGB 8.0* 08/19/2013 0143  HCT 24.5* 08/19/2013 0143   PLT 226 08/19/2013 0143   MCV 83.1 08/19/2013 0143   MCH 27.1 08/19/2013 0143   MCHC 32.7 08/19/2013 0143   RDW 14.6 08/19/2013 0143   LYMPHSABS 2.9 06/21/2011 0612   MONOABS 0.5 06/21/2011 0612   EOSABS 0.4 06/21/2011 0612   BASOSABS 0.0 06/21/2011 0612     Assessment: 1. 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 Volume overload, diuresed fairly well over night 3 Anemia and iron def 4 DM 5. HTN   Plan: 1. IV IRON 2. Increase lasix to 160mg  tid 3. Will ask [facility] to see for access.  So will hold coumadin for now 4. Recheck labs in AM 5. Add clonidine for BP  Avah Bashor T

## 2013-08-19 NOTE — Progress Notes (Signed)
[facility] Progress Note [place] TEAM 1 - Stepdown/ICU TEAM   MIAA LATTERELL RUE:[id number] DOB: 14-Mar-1941 DOA: 08/17/2013 PCP: Michiel Sites, MD  Admit HPI / Brief Narrative: 72 y.o. female with history of CAD, CABG, dyslipidemia, hypertension, IDDM, stage IV chronic kidney disease, chronic lung scarring on CT chest, ischemic cardiomyopathy with EF 44%, OSA on CPAP, COPD, on Coumadin anticoagulation who presented to the ED with worsening dyspnea. Patient claimed compliance with medications but not compliant with salt restricted diet. She gave 3 day history of progressively worsening dyspnea on exertion, orthopnea, PND. She denied chest pain or worsening leg edema. She stated that walking even a few steps brought on dyspnea. She only is able to sleep for 2 hours at night and has to wakeup secondary to dyspnea.  In the ED, chest x-ray suggestive of CHF, creatinine worse >4 which was 2.8 in April. In reviewing her home med rec: was on Lasix only when necessary and seemed to be on dual ACE inhibitors. Patient has been started on IV nitroglycerin drip and hospitalist admission requested.  Assessment/Plan:  Acute on chronic systolic CHF / ischemic cardiomyopathy / CAD / CABG 2009 Cont diuresis - follow renal function - strict Is/Os and daily weights  Accelerated hypertension BP control improved, but not yet at desired BP - careful titration of meds to continue - cont diuresis   Nitro infiltration into R forearm / hand  Asked nurse to follow establish protocol for IV infiltration of this medication - exam is benign at this time but we will need to follow this serially  Acute on stage IV chronic kidney disease In April 2014 was 2.8 - Nephrology following with [name]- diuresing well  Uncontrolled IDDM with renal complications CBG currently reasonably controlled - no change in treatment plan today  OSA Continue nightly CPAP regimen  COPD Well compensated at the present  time  Anemia Likely secondary to chronic kidney disease - follow trend  Chronic anticoagulation - PVD + hx DVT INR is not at goal - initiate heparin gtt until INR at goal  PVD s/p left femoral-popliteal bypass graft Sept 2012  Code Status: NO CODE Family Communication: no family present at time of exam Disposition Plan: SDU due to risk for acute resp decline  Consultants: Nephrology   Procedures: None  Antibiotics: None  DVT prophylaxis: Heparin  HPI/Subjective: The patient states she feels much better today.   Objective: Blood pressure 144/46, pulse 61, temperature 98.6 F (37 C), temperature source Oral, resp. rate 19, height 5\' 3"  (1.6 m), weight 68.2 kg (150 lb 5.7 oz), SpO2 97.00%.  Intake/Output Summary (Last 24 hours) at 08/19/13 1758 Last data filed at 08/19/13 1400  Gross per 24 hour  Intake    870 ml  Output   2025 ml  Net  -1155 ml   Exam: General: No acute respiratory distress at rest Lungs: Mild diffuse crackles without active wheeze Cardiovascular: Regular rate and rhythm without murmur gallop or rub normal S1 and S2 Abdomen: Nontender, nondistended, soft, bowel sounds positive, no rebound, no ascites, no appreciable mass Extremities: No significant cyanosis, clubbing; 1+ edema bilateral lower extremities  Data Reviewed: Basic Metabolic Panel:  Recent Labs Lab 08/17/13 1603 08/17/13 2343 08/18/13 0452 08/19/13 0143  NA 137 140 141 137  K 4.9 4.4 4.2 3.7  CL 105 109 109 105  CO2 21 19 19 19   GLUCOSE 337* 246* 165* 97  BUN 61* 63* 63* 61*  CREATININE 4.89* 4.83* 5.00* 5.17*  CALCIUM  8.1* 8.3* 8.4 8.6  PHOS  --   --  4.3  --    Liver Function Tests:  Recent Labs Lab 08/18/13 0452  ALBUMIN 2.2*   CBC:  Recent Labs Lab 08/17/13 1603 08/17/13 2343 08/19/13 0143  WBC 6.6 7.9 7.8  HGB 8.1* 7.8* 8.0*  HCT 24.2* 23.7* 24.5*  MCV 83.2 82.9 83.1  PLT 240 216 226   Cardiac Enzymes:  Recent Labs Lab 08/17/13 2000 08/17/13 2343  08/18/13 0701  TROPONINI <0.30 <0.30 <0.30   BNP (last 3 results)  Recent Labs  08/17/13 1603  PROBNP 28521.0*   CBG:  Recent Labs Lab 08/19/13 0120 08/19/13 0143 08/19/13 0810 08/19/13 1125 08/19/13 1732  GLUCAP 67* 97 119* 289* 111*    Recent Results (from the past 240 hour(s))  MRSA PCR SCREENING     Status: None   Collection Time    08/17/13  7:05 PM      Result Value Range Status   MRSA by PCR NEGATIVE  NEGATIVE Final   Comment:            The GeneXpert MRSA Assay (FDA     approved for NASAL specimens     only), is one component of a     comprehensive MRSA colonization     surveillance program. It is not     intended to diagnose MRSA     infection nor to guide or     monitor treatment for     MRSA infections.     Studies:  Recent x-ray studies have been reviewed in detail by the Attending Physician  Scheduled Meds:  Scheduled Meds: . amLODipine  10 mg Oral Daily  . atorvastatin  40 mg Oral Daily  . calcitRIOL  0.25 mcg Oral Daily  . carvedilol  12.5 mg Oral BID WC  . cloNIDine  0.1 mg Oral BID  . clopidogrel  75 mg Oral Daily  . darbepoetin (ARANESP) injection - NON-DIALYSIS  100 mcg Subcutaneous Q Wed-1800  . ferrous sulfate  325 mg Oral BID  . folic acid  1 mg Oral Daily  . furosemide  160 mg Intravenous TID  . insulin aspart  0-5 Units Subcutaneous QHS  . insulin aspart  0-9 Units Subcutaneous TID WC  . insulin aspart  6 Units Subcutaneous TID WC  . insulin glargine  10 Units Subcutaneous QHS  . niacin  500 mg Oral QHS  . omega-3 acid ethyl esters  1 g Oral QID  . pantoprazole  40 mg Oral Daily  . pneumococcal 23 valent vaccine  0.5 mL Intramuscular Tomorrow-1000  . polyethylene glycol  17 g Oral Daily  . senna  1 tablet Oral QHS  . sodium chloride  3 mL Intravenous Q12H    Time spent on care of this patient: 35 mins   Calvert Cantor, MD  [facility] Office  [phone] Pager - Text Page per Loretha Stapler as per  below:  On-Call/Text Page:      [email or web address]      password [email or web address]  If 7PM-7AM, please contact night-coverage [email or web address] Password [email or web address] 08/19/2013, 5:58 PM   LOS: 2 days

## 2013-08-20 ENCOUNTER — Ambulatory Visit: Payer: Medicare Other | Admitting: Vascular Surgery

## 2013-08-20 ENCOUNTER — Inpatient Hospital Stay (HOSPITAL_COMMUNITY): Admission: RE | Admit: 2013-08-20 | Payer: Medicare Other | Source: Ambulatory Visit

## 2013-08-20 LAB — RENAL FUNCTION PANEL
Albumin: 2.1 g/dL — ABNORMAL LOW (ref 3.5–5.2)
Calcium: 8.6 mg/dL (ref 8.4–10.5)
Chloride: 104 mEq/L (ref 96–112)
GFR calc Af Amer: 9 mL/min — ABNORMAL LOW (ref >90)
GFR calc non Af Amer: 8 mL/min — ABNORMAL LOW (ref >90)
Phosphorus: 4.8 mg/dL — ABNORMAL HIGH (ref 2.3–4.6)
Potassium: 3.9 mEq/L (ref 3.5–5.1)
Sodium: 139 mEq/L (ref 135–145)

## 2013-08-20 LAB — HEPARIN LEVEL (UNFRACTIONATED): Heparin Unfractionated: 0.6 IU/mL (ref 0.30–0.70)

## 2013-08-20 LAB — GLUCOSE, CAPILLARY
Glucose-Capillary: 135 mg/dL — ABNORMAL HIGH (ref 70–99)
Glucose-Capillary: 136 mg/dL — ABNORMAL HIGH (ref 70–99)

## 2013-08-20 LAB — PROTIME-INR
INR: 1.72 — ABNORMAL HIGH (ref 0.00–1.49)
Prothrombin Time: 19.7 seconds — ABNORMAL HIGH (ref 11.6–15.2)

## 2013-08-20 LAB — CBC
HCT: 25.3 % — ABNORMAL LOW (ref 36.0–46.0)
MCHC: 33.2 g/dL (ref 30.0–36.0)
Platelets: 230 10*3/uL (ref 150–400)
RDW: 14.4 % (ref 11.5–15.5)
WBC: 5.8 10*3/uL (ref 4.0–10.5)

## 2013-08-20 MED ORDER — FUROSEMIDE 80 MG PO TABS
160.0000 mg | ORAL_TABLET | Freq: Three times a day (TID) | ORAL | Status: DC
  Administered 2013-08-20 – 2013-08-24 (×15): 160 mg via ORAL
  Filled 2013-08-20 (×18): qty 2

## 2013-08-20 NOTE — Progress Notes (Signed)
S: .  Breathing better. No uremic Sxs O:BP 162/54  Pulse 53  Temp(Src) 97.7 F (36.5 C) (Oral)  Resp 17  Ht 5\' 3"  (1.6 m)  Wt 66.6 kg (146 lb 13.2 oz)  BMI 26.02 kg/m2  SpO2 96%  Intake/Output Summary (Last 24 hours) at 08/20/13 0939 Last data filed at 08/20/13 0700  Gross per 24 hour  Intake  934.5 ml  Output   2800 ml  Net -1865.5 ml   Weight change: -1.6 kg (-3 lb 8.4 oz) VPX:TGGYI and alert CVS:RRR Resp: improved basilar crackles Abd:+ BS NTND Ext: 0-tr edema NEURO: CNI Ox3 No asterixis   . amLODipine  10 mg Oral Daily  . atorvastatin  40 mg Oral Daily  . calcitRIOL  0.25 mcg Oral Daily  . carvedilol  12.5 mg Oral BID WC  . cloNIDine  0.1 mg Oral BID  . clopidogrel  75 mg Oral Daily  . darbepoetin (ARANESP) injection - NON-DIALYSIS  100 mcg Subcutaneous Q Wed-1800  . ferrous sulfate  325 mg Oral BID  . folic acid  1 mg Oral Daily  . furosemide  160 mg Intravenous TID  . insulin aspart  0-5 Units Subcutaneous QHS  . insulin aspart  0-9 Units Subcutaneous TID WC  . insulin aspart  6 Units Subcutaneous TID WC  . insulin glargine  10 Units Subcutaneous QHS  . niacin  500 mg Oral QHS  . omega-3 acid ethyl esters  1 g Oral QID  . pantoprazole  40 mg Oral Daily  . pneumococcal 23 valent vaccine  0.5 mL Intramuscular Tomorrow-1000  . polyethylene glycol  17 g Oral Daily  . senna  1 tablet Oral QHS  . sodium chloride  3 mL Intravenous Q12H   No results found. BMET    Component Value Date/Time   NA 139 08/20/2013 0500   K 3.9 08/20/2013 0500   CL 104 08/20/2013 0500   CO2 22 08/20/2013 0500   GLUCOSE 99 08/20/2013 0500   BUN 56* 08/20/2013 0500   CREATININE 5.16* 08/20/2013 0500   CALCIUM 8.6 08/20/2013 0500   CALCIUM 8.7 07/01/2011 1831   GFRNONAA 8* 08/20/2013 0500   GFRAA 9* 08/20/2013 0500   CBC    Component Value Date/Time   WBC 5.8 08/20/2013 0455   RBC 3.07* 08/20/2013 0455   HGB 8.4* 08/20/2013 0455   HCT 25.3* 08/20/2013 0455   PLT 230 08/20/2013 0455   MCV  82.4 08/20/2013 0455   MCH 27.4 08/20/2013 0455   MCHC 33.2 08/20/2013 0455   RDW 14.4 08/20/2013 0455   LYMPHSABS 2.9 06/21/2011 0612   MONOABS 0.5 06/21/2011 0612   EOSABS 0.4 06/21/2011 0612   BASOSABS 0.0 06/21/2011 0612     Assessment: 1. 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 Volume overload, improved 3 Anemia and iron def, SP IV iron and on aranesp 4 DM 5. HTN 6. Sec HPTH on calcitriol   Plan: 1. I discussed with her the need to get access placed now as she will need HD in near future.  She wants to talk to her family.  She is agreeable to HD when the time is needed. She is aware that if she does not proceed accordingly she will end up with permcath to start HD 2. Change to PO lasix 3. Recheck labs in AM Finbar Nippert T

## 2013-08-20 NOTE — Progress Notes (Signed)
Came to visit patient at bedside to discuss and explain [facility] Care Management services. Reports she lives alone but has supportive daughter. Consents were signed and explained to her that she will receive post hospital discharge call and will be evaluated for monthly home visits. Made inpatient RNCM aware that [facility] Care Management to follow.  Raiford Noble, MSN-Ed, RN,BSN- [facility] [phone]

## 2013-08-20 NOTE — Progress Notes (Addendum)
Pt to TX to 4E-04, VSS, called report

## 2013-08-20 NOTE — Progress Notes (Signed)
The patient has arrived to the unit from 3S, report received from nurse, the patient and vital signs are stable, assessment completed; will continue to monitor patient. Lorretta Harp RN

## 2013-08-20 NOTE — Progress Notes (Signed)
[facility]  Pt still undecided on proceeding with access.  Let us know if patient makes a decision to proceed and we will schedule L RC vs BC AVF.  I will be out until 12 NOV 14, so Dr. Myra Gianotti will be covering for me.  Leonides Sake, MD [facility] [place] Office: [phone] Pager: [phone]  08/20/2013, 4:39 PM

## 2013-08-20 NOTE — Progress Notes (Signed)
[facility] Progress Note [place] TEAM 1 - Stepdown/ICU TEAM   DORIE OHMS ZOX:[id number] DOB: 12-11-1940 DOA: 08/17/2013 PCP: Michiel Sites, MD  Admit HPI / Brief Narrative: 72 y.o. female with history of CAD, CABG, dyslipidemia, hypertension, IDDM, stage IV chronic kidney disease, chronic lung scarring on CT chest, ischemic cardiomyopathy with EF 44%, OSA on CPAP, COPD, on Coumadin anticoagulation who presented to the ED with worsening dyspnea. Patient claimed compliance with medications but not compliant with salt restricted diet. She gave 3 day history of progressively worsening dyspnea on exertion, orthopnea, PND. She denied chest pain or worsening leg edema. She stated that walking even a few steps brought on dyspnea. She only is able to sleep for 2 hours at night and has to wakeup secondary to dyspnea.  In the ED, chest x-ray suggestive of CHF, creatinine worse >4 which was 2.8 in April. In reviewing her home med rec: was on Lasix only when necessary and seemed to be on dual ACE inhibitors. Patient has been started on IV nitroglycerin drip and hospitalist admission requested.  Assessment/Plan:  Acute on chronic systolic CHF / ischemic cardiomyopathy / CAD / CABG 2009 -resolved Lasix switched to PO - oxygen saturations on room air 100%  Accelerated hypertension BP control improved, but not yet at desired BP - careful titration of meds to continue - cont diuresis   Nitro infiltration into R forearm / hand  Stable   Acute on stage IV chronic kidney disease In April 2014 was 2.8 - Nephrology following with [name]- diuresing well- lasix switched to PO now - plans for placement of a dialysis cath if pt agrees   Uncontrolled IDDM with renal complications CBG currently reasonably controlled - no change in treatment plan   OSA Continue nightly CPAP regimen  COPD Well compensated at the present time  Anemia Likely secondary to chronic kidney disease - follow  trend  Chronic anticoagulation - PVD + hx DVT INR is not at goal - initiate heparin gtt until INR at goal  PVD s/p left femoral-popliteal bypass graft Sept 2012  Code Status: NO CODE Family Communication: no family present at time of exam Disposition Plan: transfer to tele- pt eval for final disposition is still pending.   Consultants: Nephrology   Procedures: None  Antibiotics: None  DVT prophylaxis: Heparin  HPI/Subjective: The patient continues to feel well and is looking forward to being able to go home soon. She is discussing placement of dialysis cath with family.   Objective: Blood pressure 162/54, pulse 53, temperature 97.7 F (36.5 C), temperature source Oral, resp. rate 17, height 5\' 3"  (1.6 m), weight 66.6 kg (146 lb 13.2 oz), SpO2 100.00%.  Intake/Output Summary (Last 24 hours) at 08/20/13 1218 Last data filed at 08/20/13 0700  Gross per 24 hour  Intake    837 ml  Output   2325 ml  Net  -1488 ml   Exam: General: No acute respiratory distress at rest Lungs: CTA b/l Cardiovascular: Regular rate and rhythm without murmur gallop or rub normal S1 and S2 Abdomen: Nontender, nondistended, soft, bowel sounds positive, no rebound, no ascites, no appreciable mass Extremities: No significant cyanosis, clubbing; 1+ edema bilateral lower extremities  Data Reviewed: Basic Metabolic Panel:  Recent Labs Lab 08/17/13 1603 08/17/13 2343 08/18/13 0452 08/19/13 0143 08/20/13 0500  NA 137 140 141 137 139  K 4.9 4.4 4.2 3.7 3.9  CL 105 109 109 105 104  CO2 21 19 19 19 22   GLUCOSE 337* 246*  165* 97 99  BUN 61* 63* 63* 61* 56*  CREATININE 4.89* 4.83* 5.00* 5.17* 5.16*  CALCIUM 8.1* 8.3* 8.4 8.6 8.6  PHOS  --   --  4.3  --  4.8*   Liver Function Tests:  Recent Labs Lab 08/18/13 0452 08/20/13 0500  ALBUMIN 2.2* 2.1*   CBC:  Recent Labs Lab 08/17/13 1603 08/17/13 2343 08/19/13 0143 08/20/13 0455  WBC 6.6 7.9 7.8 5.8  HGB 8.1* 7.8* 8.0* 8.4*  HCT  24.2* 23.7* 24.5* 25.3*  MCV 83.2 82.9 83.1 82.4  PLT 240 216 226 230   Cardiac Enzymes:  Recent Labs Lab 08/17/13 2000 08/17/13 2343 08/18/13 0701  TROPONINI <0.30 <0.30 <0.30   BNP (last 3 results)  Recent Labs  08/17/13 1603  PROBNP 28521.0*   CBG:  Recent Labs Lab 08/19/13 0810 08/19/13 1125 08/19/13 1732 08/19/13 2151 08/20/13 0743  GLUCAP 119* 289* 111* 135* 108*    Recent Results (from the past 240 hour(s))  MRSA PCR SCREENING     Status: None   Collection Time    08/17/13  7:05 PM      Result Value Range Status   MRSA by PCR NEGATIVE  NEGATIVE Final   Comment:            The GeneXpert MRSA Assay (FDA     approved for NASAL specimens     only), is one component of a     comprehensive MRSA colonization     surveillance program. It is not     intended to diagnose MRSA     infection nor to guide or     monitor treatment for     MRSA infections.     Studies:  Recent x-ray studies have been reviewed in detail by the Attending Physician  Scheduled Meds:  Scheduled Meds: . amLODipine  10 mg Oral Daily  . atorvastatin  40 mg Oral Daily  . calcitRIOL  0.25 mcg Oral Daily  . carvedilol  12.5 mg Oral BID WC  . cloNIDine  0.1 mg Oral BID  . clopidogrel  75 mg Oral Daily  . darbepoetin (ARANESP) injection - NON-DIALYSIS  100 mcg Subcutaneous Q Wed-1800  . ferrous sulfate  325 mg Oral BID  . folic acid  1 mg Oral Daily  . furosemide  160 mg Oral TID  . insulin aspart  0-5 Units Subcutaneous QHS  . insulin aspart  0-9 Units Subcutaneous TID WC  . insulin aspart  6 Units Subcutaneous TID WC  . insulin glargine  10 Units Subcutaneous QHS  . niacin  500 mg Oral QHS  . omega-3 acid ethyl esters  1 g Oral QID  . pantoprazole  40 mg Oral Daily  . pneumococcal 23 valent vaccine  0.5 mL Intramuscular Tomorrow-1000  . polyethylene glycol  17 g Oral Daily  . senna  1 tablet Oral QHS  . sodium chloride  3 mL Intravenous Q12H    Time spent on care of this  patient: 35 mins   Calvert Cantor, MD  [facility] Office  [phone] Pager - Text Page per Loretha Stapler as per below:  On-Call/Text Page:      [email or web address]      password [email or web address]  If 7PM-7AM, please contact night-coverage [email or web address] Password [email or web address] 08/20/2013, 12:18 PM   LOS: 3 days

## 2013-08-20 NOTE — Evaluation (Signed)
Physical Therapy Evaluation Patient Details Name: [name] MRN: [id number] DOB: 1940/10/30 Today's Date: 08/20/2013 Time: 4098-1191 PT Time Calculation (min): 15 min  PT Assessment / Plan / Recommendation History of Present Illness  72 y.o. female admitted to [facility] on 08/17/13 with history of CAD, CABG, her dyslipidemia, hypertension, IDDM, stage IV chronic kidney disease, chronic lung scarring on CT chest, ischemic cardiomyopathy with EF 44%, OSA on CPAP, COPD, on Coumadin anticoagulation-? Etiology, presenting this admission with dyspnea/SOB.    Clinical Impression  Pt is moving well, albeit slowly. She has a very mildly staggering gait pattern, which I believe is due to being mostly in the bed for the last few days.  I encouraged her to walk with staff in the hall later today and over the weekend. O2 sats 100% on RA with gait.  PT to follow acutely, but I do not think she will need follow up at discharge as I believe that once she gets moving again her gait will normalize.     PT Assessment  Patient needs continued PT services    Follow Up Recommendations  No PT follow up    Does the patient have the potential to tolerate intense rehabilitation     NA  Barriers to Discharge   None      Equipment Recommendations  None recommended by PT    Recommendations for Other Services   None  Frequency Min 3X/week    Precautions / Restrictions   None  Pertinent Vitals/Pain O2 sats 100% on RA with gait.       Mobility  Bed Mobility Bed Mobility: Sit to Supine Sit to Supine: 7: Independent Details for Bed Mobility Assistance: pt able to lift bil legs against gravity and get back into bed without assistance.  Transfers Transfers: Sit to Stand;Stand to Sit Sit to Stand: 5: Supervision;With upper extremity assist;With armrests;From bed;From chair/3-in-1 Stand to Sit: 5: Supervision;With upper extremity assist;With armrests;To bed;To chair/3-in-1 Details for Transfer Assistance:  supervision for safety Ambulation/Gait Ambulation/Gait Assistance: 5: Supervision Ambulation Distance (Feet): 400 Feet Assistive device: None Ambulation/Gait Assistance Details: supervision for safety due to midly staggering gait pattern and decreased speed Gait Pattern: Step-through pattern (staggering) Gait velocity: decreased        PT Diagnosis: Difficulty walking;Abnormality of gait  PT Problem List: Decreased activity tolerance;Decreased balance;Decreased mobility PT Treatment Interventions: Gait training;DME instruction;Functional mobility training;Therapeutic activities;Therapeutic exercise;Balance training;Neuromuscular re-education;Patient/family education     PT Goals(Current goals can be found in the care plan section) Acute Rehab PT Goals Patient Stated Goal: to go home, feel back to normal PT Goal Formulation: With patient Time For Goal Achievement: 09/03/13 Potential to Achieve Goals: Good  Visit Information  Last PT Received On: 08/20/13 Assistance Needed: +1 History of Present Illness: 72 y.o. female admitted to [facility] on 08/17/13 with history of CAD, CABG, her dyslipidemia, hypertension, IDDM, stage IV chronic kidney disease, chronic lung scarring on CT chest, ischemic cardiomyopathy with EF 44%, OSA on CPAP, COPD, on Coumadin anticoagulation-? Etiology, presenting this admission with dyspnea/SOB.         Prior Functioning  Home Living Family/patient expects to be discharged to:: Private residence ("retirement apartment") Living Arrangements: Alone Available Help at Discharge: Family;Available PRN/intermittently Type of Home: Apartment Home Access: Level entry Home Layout: One level Home Equipment: None Prior Function Level of Independence: Independent Communication Communication: No difficulties    Cognition  Cognition Arousal/Alertness: Awake/alert Behavior During Therapy: WFL for tasks assessed/performed Overall Cognitive Status: Within Functional  Limits for  tasks assessed    Extremity/Trunk Assessment Upper Extremity Assessment Upper Extremity Assessment: Overall WFL for tasks assessed Lower Extremity Assessment Lower Extremity Assessment: Overall WFL for tasks assessed Cervical / Trunk Assessment Cervical / Trunk Assessment: Normal      End of Session PT - End of Session Activity Tolerance: Patient limited by fatigue Patient left: in bed;with call bell/phone within reach    [facility] B. Bradyn Soward, PT, DPT [phone]   08/20/2013, 2:41 PM

## 2013-08-20 NOTE — Progress Notes (Signed)
ANTICOAGULATION CONSULT NOTE - Follow Up Consult  Pharmacy Consult for heparin  Indication: severe PVD  Allergies  Allergen Reactions  . Ivp Dye [Iodinated Diagnostic Agents]   . Aspirin Hives    Labs:  Recent Labs  08/17/13 2000 08/17/13 2343 08/18/13 0452 08/18/13 0701 08/19/13 0143 08/19/13 1100 08/20/13 0455 08/20/13 0500  HGB  --  7.8*  --   --  8.0*  --  8.4*  --   HCT  --  23.7*  --   --  24.5*  --  25.3*  --   PLT  --  216  --   --  226  --  230  --   LABPROT  --   --  17.6*  --  19.2*  --  19.7*  --   INR  --   --  1.49  --  1.67*  --  1.72*  --   HEPARINUNFRC  --   --   --   --  0.25* 0.32 0.60  --   CREATININE  --  4.83* 5.00*  --  5.17*  --   --  5.16*  TROPONINI <0.30 <0.30  --  <0.30  --   --   --   --     Estimated Creatinine Clearance: 9.2 ml/min (by C-G formula based on Cr of 5.16).  Assessment: Heparin level now therapeutic, but trending up.  Coumadin on hold awaiting access plans.   Goal of Therapy:  Heparin level 0.3-0.7 units/ml Monitor platelets by anticoagulation protocol: Yes   Plan:  Decrease heparin to 900 units / hr Follow up AM labs  Thank you. Okey Regal, PharmD  08/20/2013,8:08 AM

## 2013-08-20 NOTE — Care Management Note (Signed)
    Page 1 of 2   08/25/2013     10:08:50 AM   CARE MANAGEMENT NOTE 08/25/2013  Patient:  [name], [name]   Account Number:  [id number]  Date Initiated:  08/18/2013  Documentation initiated by:  [name]  Subjective/Objective Assessment:   Pt admitted with CHF, RF     Action/Plan:   PTA pt lived at home - NCM to follow for d/c needs   Anticipated DC Date:  08/23/2013   Anticipated DC Plan:  HOME W HOME HEALTH SERVICES      DC Planning Services  CM consult      Choice offered to / List presented to:  C-1 Patient   DME arranged  Levan Hurst      DME agency  [facility]     HH arranged  HH-2 PT      [facility] agency  [facility]   Status of service:  In process, will continue to follow Medicare Important Message given?   (If response is "NO", the following Medicare IM given date fields will be blank) Date Medicare IM given:   Date Additional Medicare IM given:    Discharge Disposition:    Per UR Regulation:  Reviewed for med. necessity/level of care/duration of stay  If discussed at Long Length of Stay Meetings, dates discussed:   08/24/2013    Comments:  08/25/13 0950  Rhonda Cohn, RN, BSN, NCM [phone] Spoke with pt at bedside regarding discharge planning for [facility]. Offered pt list of home health agencies to choose from.  Pt chose [facility] to render services of PT. [name] of [facility] notified.  DME needs identified is Rollator rolling walker. NCM ordered Rollator and it will be delivered to pt room prior to D/C home.  08/20/13- 1230- Rhonda Pierini RN, BSN [phone] In to speak with pt at bedside- per conversation pt states that she does have a CPAP at home but it does not work currently- has had it about a year- checked with [facility] machine did not come from them- pt does not remember where she got machine from- pt states that she pays only $1.20 or $3.60 for meds (one is $10) and uses [facility]  for meds. [facility] has been in to see pt and will follow at discharge. Pt has PCP- [name]. - NCM to cont. to follow for d/c needs

## 2013-08-21 LAB — GLUCOSE, CAPILLARY
Glucose-Capillary: 305 mg/dL — ABNORMAL HIGH (ref 70–99)
Glucose-Capillary: 171 mg/dL — ABNORMAL HIGH (ref 70–99)
Glucose-Capillary: 156 mg/dL — ABNORMAL HIGH (ref 70–99)

## 2013-08-21 LAB — CBC
MCH: 27 pg (ref 26.0–34.0)
MCHC: 33 g/dL (ref 30.0–36.0)
MCV: 82 fL (ref 78.0–100.0)
Platelets: 239 10*3/uL (ref 150–400)

## 2013-08-21 LAB — RENAL FUNCTION PANEL
Calcium: 8.7 mg/dL (ref 8.4–10.5)
Creatinine, Ser: 5.38 mg/dL — ABNORMAL HIGH (ref 0.50–1.10)
GFR calc Af Amer: 8 mL/min — ABNORMAL LOW (ref >90)
GFR calc non Af Amer: 7 mL/min — ABNORMAL LOW (ref >90)
Glucose, Bld: 122 mg/dL — ABNORMAL HIGH (ref 70–99)
Phosphorus: 4.6 mg/dL (ref 2.3–4.6)
Sodium: 140 mEq/L (ref 135–145)

## 2013-08-21 LAB — PROTIME-INR
INR: 1.61 — ABNORMAL HIGH (ref 0.00–1.49)
Prothrombin Time: 18.7 seconds — ABNORMAL HIGH (ref 11.6–15.2)

## 2013-08-21 MED ORDER — SALINE SPRAY 0.65 % NA SOLN
1.0000 | NASAL | Status: DC | PRN
  Administered 2013-08-21: 22:00:00 1 via NASAL
  Filled 2013-08-21: qty 44

## 2013-08-21 NOTE — Progress Notes (Signed)
Pt ambulated x2 in hallway from room to nsg station.

## 2013-08-21 NOTE — Progress Notes (Signed)
S: .  Breathing better. No uremic Sxs O:BP 151/47  Pulse 59  Temp(Src) 97.5 F (36.4 C) (Oral)  Resp 20  Ht 5\' 3"  (1.6 m)  Wt 63.413 kg (139 lb 12.8 oz)  BMI 24.77 kg/m2  SpO2 95%  Intake/Output Summary (Last 24 hours) at 08/21/13 1008 Last data filed at 08/21/13 0800  Gross per 24 hour  Intake  946.5 ml  Output   1400 ml  Net -453.5 ml   Weight change: -0.4 kg (-14.1 oz) ZOX:WRUEA and alert CVS:RRR Resp: improved basilar crackles Abd:+ BS NTND Ext: 0-tr edema NEURO: CNI Ox3 No asterixis   . amLODipine  10 mg Oral Daily  . atorvastatin  40 mg Oral Daily  . calcitRIOL  0.25 mcg Oral Daily  . carvedilol  12.5 mg Oral BID WC  . cloNIDine  0.1 mg Oral BID  . clopidogrel  75 mg Oral Daily  . darbepoetin (ARANESP) injection - NON-DIALYSIS  100 mcg Subcutaneous Q Wed-1800  . ferrous sulfate  325 mg Oral BID  . folic acid  1 mg Oral Daily  . furosemide  160 mg Oral TID  . insulin aspart  0-5 Units Subcutaneous QHS  . insulin aspart  0-9 Units Subcutaneous TID WC  . insulin aspart  6 Units Subcutaneous TID WC  . insulin glargine  10 Units Subcutaneous QHS  . niacin  500 mg Oral QHS  . omega-3 acid ethyl esters  1 g Oral QID  . pantoprazole  40 mg Oral Daily  . pneumococcal 23 valent vaccine  0.5 mL Intramuscular Tomorrow-1000  . polyethylene glycol  17 g Oral Daily  . senna  1 tablet Oral QHS  . sodium chloride  3 mL Intravenous Q12H   No results found. BMET    Component Value Date/Time   NA 140 08/21/2013 0441   K 4.1 08/21/2013 0441   CL 104 08/21/2013 0441   CO2 21 08/21/2013 0441   GLUCOSE 122* 08/21/2013 0441   BUN 56* 08/21/2013 0441   CREATININE 5.38* 08/21/2013 0441   CALCIUM 8.7 08/21/2013 0441   CALCIUM 8.7 07/01/2011 1831   GFRNONAA 7* 08/21/2013 0441   GFRAA 8* 08/21/2013 0441   CBC    Component Value Date/Time   WBC 8.4 08/21/2013 0441   RBC 3.22* 08/21/2013 0441   HGB 8.7* 08/21/2013 0441   HCT 26.4* 08/21/2013 0441   PLT 239 08/21/2013 0441   MCV 82.0  08/21/2013 0441   MCH 27.0 08/21/2013 0441   MCHC 33.0 08/21/2013 0441   RDW 14.1 08/21/2013 0441   LYMPHSABS 2.9 06/21/2011 0612   MONOABS 0.5 06/21/2011 0612   EOSABS 0.4 06/21/2011 0612   BASOSABS 0.0 06/21/2011 0612     Assessment: 1. 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 Volume overload, improved 3 Anemia and iron def, SP IV iron and on aranesp 4 DM 5. HTN 6. Sec HPTH on calcitriol   Plan: 1.She says her family is coming in today to discuss access placement.  She has told me she would do HD, she just seems reluctant to get access placed ahead of time.  I think she will come around.  2. Keep on IV heparin for now 3 Cont with PO lasix 4. Recheck labs in AM [facility] T

## 2013-08-21 NOTE — Progress Notes (Signed)
[facility] PROGRESS NOTE  [name] WUJ:[id number] DOB: 06-03-1941 DOA: 08/17/2013 PCP: Michiel Sites, MD  HPI: 72 y.o. female with history of CAD, CABG, dyslipidemia, hypertension, IDDM, stage IV chronic kidney disease, chronic lung scarring on CT chest, ischemic cardiomyopathy with EF 44%, OSA on CPAP, COPD, on Coumadin anticoagulation who presented to the ED with worsening dyspnea. Patient claimed compliance with medications but not compliant with salt restricted diet. She gave 3 day history of progressively worsening dyspnea on exertion, orthopnea, PND. She denied chest pain or worsening leg edema. She stated that walking even a few steps brought on dyspnea. She only is able to sleep for 2 hours at night and has to wakeup secondary to dyspnea. In the ED, chest x-ray suggestive of CHF, creatinine worse >4 which was 2.8 in April. In reviewing her home med rec: was on Lasix only when necessary and seemed to be on dual ACE inhibitors. Patient has been started on IV nitroglycerin drip and hospitalist admission requested.  Assessment/Plan: Acute on stage IV chronic kidney disease  In April 2014 was 2.8 - Nephrology following with [name]- diuresing well- lasix switched to PO now  - plans for placement of a dialysis cath if pt agrees  - patient is still thinking on whether she wishes to pursue dialysis or not. She will talk to her family today. Plan for vascular acces on Monday should she decide for. No acute needs for dialysis now.  Acute on chronic systolic CHF / ischemic cardiomyopathy / CAD / CABG 2009  - resolved  Lasix switched to PO - oxygen saturations on room air 100%  Accelerated hypertension  BP control improved, but not yet at desired BP - careful titration of meds to continue - cont diuresis  Nitro infiltration into R forearm / hand  Stable  Uncontrolled IDDM with renal complications  CBG currently reasonably controlled - no change in treatment plan  OSA  Continue nightly  CPAP regimen  COPD  Well compensated at the present time  Anemia  Likely secondary to chronic kidney disease - follow trend  Chronic anticoagulation - PVD + hx DVT  INR is not at goal - initiate heparin gtt until INR at goal  PVD s/p left femoral-popliteal bypass graft Sept 2012  Diet: renal Fluids: none DVT Prophylaxis: heparin   Code Status: DNR Family Communication: none  Disposition Plan: inpatient, home when ready  Consultants:  Nephrology  Procedures:  None    Antibiotics  Anti-infectives   None     Antibiotics Given (last 72 hours)   None      HPI/Subjective: - feeling well  Objective: Filed Vitals:   08/20/13 1516 08/20/13 2113 08/21/13 0129 08/21/13 0454  BP: 154/48 149/55 155/47 151/47  Pulse: 55 56 59 59  Temp: 98.9 F (37.2 C) 98.3 F (36.8 C) 98.6 F (37 C) 97.5 F (36.4 C)  TempSrc: Oral Oral Oral Oral  Resp: 16 18 18 20   Height: 5\' 3"  (1.6 m)     Weight: 66.2 kg (145 lb 15.1 oz)   63.413 kg (139 lb 12.8 oz)  SpO2: 97% 97% 95% 95%    Intake/Output Summary (Last 24 hours) at 08/21/13 0714 Last data filed at 08/21/13 0600  Gross per 24 hour  Intake 1306.5 ml  Output   1550 ml  Net -243.5 ml   Filed Weights   08/20/13 0500 08/20/13 1516 08/21/13 0454  Weight: 66.6 kg (146 lb 13.2 oz) 66.2 kg (145 lb 15.1 oz) 63.413 kg (139  lb 12.8 oz)    Exam:   General:  NAD  Cardiovascular: regular rate and rhythm, without MRG  Respiratory: good air movement, clear to auscultation throughout, no wheezing, ronchi or rales  Abdomen: soft, not tender to palpation, positive bowel sounds  MSK: no peripheral edema  Neuro: CN 2-12 grossly intact, MS 5/5 in all 4  Data Reviewed: Basic Metabolic Panel:  Recent Labs Lab 08/17/13 2343 08/18/13 0452 08/19/13 0143 08/20/13 0500 08/21/13 0441  NA 140 141 137 139 140  K 4.4 4.2 3.7 3.9 4.1  CL 109 109 105 104 104  CO2 19 19 19 22 21   GLUCOSE 246* 165* 97 99 122*  BUN 63* 63* 61* 56* 56*   CREATININE 4.83* 5.00* 5.17* 5.16* 5.38*  CALCIUM 8.3* 8.4 8.6 8.6 8.7  PHOS  --  4.3  --  4.8* 4.6   Liver Function Tests:  Recent Labs Lab 08/18/13 0452 08/20/13 0500 08/21/13 0441  ALBUMIN 2.2* 2.1* 2.1*   No results found for this basename: LIPASE, AMYLASE,  in the last 168 hours No results found for this basename: AMMONIA,  in the last 168 hours CBC:  Recent Labs Lab 08/17/13 1603 08/17/13 2343 08/19/13 0143 08/20/13 0455 08/21/13 0441  WBC 6.6 7.9 7.8 5.8 8.4  HGB 8.1* 7.8* 8.0* 8.4* 8.7*  HCT 24.2* 23.7* 24.5* 25.3* 26.4*  MCV 83.2 82.9 83.1 82.4 82.0  PLT 240 216 226 230 239   Cardiac Enzymes:  Recent Labs Lab 08/17/13 2000 08/17/13 2343 08/18/13 0701  TROPONINI <0.30 <0.30 <0.30   BNP (last 3 results)  Recent Labs  08/17/13 1603  PROBNP 28521.0*   CBG:  Recent Labs Lab 08/20/13 0743 08/20/13 1241 08/20/13 1614 08/20/13 2213 08/21/13 0621  GLUCAP 108* 352* 190* 136* 115*    Recent Results (from the past 240 hour(s))  MRSA PCR SCREENING     Status: None   Collection Time    08/17/13  7:05 PM      Result Value Range Status   MRSA by PCR NEGATIVE  NEGATIVE Final   Comment:            The GeneXpert MRSA Assay (FDA     approved for NASAL specimens     only), is one component of a     comprehensive MRSA colonization     surveillance program. It is not     intended to diagnose MRSA     infection nor to guide or     monitor treatment for     MRSA infections.     Studies: No results found.  Scheduled Meds: . amLODipine  10 mg Oral Daily  . atorvastatin  40 mg Oral Daily  . calcitRIOL  0.25 mcg Oral Daily  . carvedilol  12.5 mg Oral BID WC  . cloNIDine  0.1 mg Oral BID  . clopidogrel  75 mg Oral Daily  . darbepoetin (ARANESP) injection - NON-DIALYSIS  100 mcg Subcutaneous Q Wed-1800  . ferrous sulfate  325 mg Oral BID  . folic acid  1 mg Oral Daily  . furosemide  160 mg Oral TID  . insulin aspart  0-5 Units Subcutaneous QHS  .  insulin aspart  0-9 Units Subcutaneous TID WC  . insulin aspart  6 Units Subcutaneous TID WC  . insulin glargine  10 Units Subcutaneous QHS  . niacin  500 mg Oral QHS  . omega-3 acid ethyl esters  1 g Oral QID  . pantoprazole  40 mg Oral  Daily  . pneumococcal 23 valent vaccine  0.5 mL Intramuscular Tomorrow-1000  . polyethylene glycol  17 g Oral Daily  . senna  1 tablet Oral QHS  . sodium chloride  3 mL Intravenous Q12H   Continuous Infusions: . heparin 900 Units/hr (08/21/13 0041)    Principal Problem:   Acute on chronic systolic CHF (congestive heart failure) Active Problems:   PVD (peripheral vascular disease)   S/P CABG x 5   COPD (chronic obstructive pulmonary disease)   OSA on CPAP   Essential hypertension   Renal failure, acute on chronic stage 4   Anemia  Time spent: 25  Pamella Pert, MD [facility] Pager [phone]. If 7 PM - 7 AM, please contact night-coverage at [email or web address], password [facility] 08/21/2013, 7:14 AM  LOS: 4 days

## 2013-08-21 NOTE — Progress Notes (Signed)
PHARMACY CONSULT NOTE   Pharmacy Consult for :  Coumadin with Heparin bridging  Indication: Severe PVD  Dosing weight 66 kg  Labs:  Recent Labs  08/19/13 0143 08/19/13 1100 08/20/13 0455 08/20/13 0500 08/21/13 0441  HGB 8.0*  --  8.4*  --  8.7*  HCT 24.5*  --  25.3*  --  26.4*  PLT 226  --  230  --  239  INR 1.67*  --  1.72*  --  1.61*  HEPARINUNFRC 0.25* 0.32 0.60  --  0.46  CREATININE 5.17*  --   --  5.16* 5.38*   Lab Results  Component Value Date   INR 1.61* 08/21/2013   INR 1.72* 08/20/2013   INR 1.67* 08/19/2013   Estimated Creatinine Clearance: 8.6 ml/min (by C-G formula based on Cr of 5.38).  Medications:  Scheduled:  . amLODipine  10 mg Oral Daily  . atorvastatin  40 mg Oral Daily  . calcitRIOL  0.25 mcg Oral Daily  . carvedilol  12.5 mg Oral BID WC  . cloNIDine  0.1 mg Oral BID  . clopidogrel  75 mg Oral Daily  . darbepoetin (ARANESP) injection - NON-DIALYSIS  100 mcg Subcutaneous Q Wed-1800  . ferrous sulfate  325 mg Oral BID  . folic acid  1 mg Oral Daily  . furosemide  160 mg Oral TID  . insulin aspart  0-5 Units Subcutaneous QHS  . insulin aspart  0-9 Units Subcutaneous TID WC  . insulin aspart  6 Units Subcutaneous TID WC  . insulin glargine  10 Units Subcutaneous QHS  . niacin  500 mg Oral QHS  . omega-3 acid ethyl esters  1 g Oral QID  . pantoprazole  40 mg Oral Daily  . pneumococcal 23 valent vaccine  0.5 mL Intramuscular Tomorrow-1000  . polyethylene glycol  17 g Oral Daily  . senna  1 tablet Oral QHS  . sodium chloride  3 mL Intravenous Q12H   Infusions:  . heparin 900 Units/hr (08/21/13 0041)    Assessment:  72 y/o female on Coumadin with Heparin bridging for severe PVD.  Coumadin currently on hold pending decision by patient for new HD catheter placement.  Heparin rate 900 units/hr.  Heparin level within therapeutic window, 0.46 units/ml.  INR 1.61.  No bleeding complications noted   Goal of Therapy:   Heparin level 0.3-0.7  units/ml   Plan:  1. Continue to hold Coumadin. 2. Continue Heparin infusion at 900 units/hr.  Monitor for bleeding complications  Daily Heparin level, INR, CBC.   Kayston Jodoin, Elisha Headland, Pharm.D. 08/21/2013, 1:04 PM

## 2013-08-21 NOTE — Progress Notes (Signed)
C/o lightheadedness with last ambulation. Cbg 171 BP 147/45  No bradycardia. Resolved with sitting up in chair.

## 2013-08-22 LAB — RENAL FUNCTION PANEL
Albumin: 2.1 g/dL — ABNORMAL LOW (ref 3.5–5.2)
BUN: 53 mg/dL — ABNORMAL HIGH (ref 6–23)
Calcium: 8.6 mg/dL (ref 8.4–10.5)
Creatinine, Ser: 5.56 mg/dL — ABNORMAL HIGH (ref 0.50–1.10)
Glucose, Bld: 179 mg/dL — ABNORMAL HIGH (ref 70–99)
Phosphorus: 4.1 mg/dL (ref 2.3–4.6)
Potassium: 3.8 mEq/L (ref 3.5–5.1)

## 2013-08-22 LAB — CBC
MCH: 27.2 pg (ref 26.0–34.0)
MCV: 83.6 fL (ref 78.0–100.0)
Platelets: 239 10*3/uL (ref 150–400)
RDW: 14.6 % (ref 11.5–15.5)
WBC: 8.8 10*3/uL (ref 4.0–10.5)

## 2013-08-22 LAB — GLUCOSE, CAPILLARY
Glucose-Capillary: 210 mg/dL — ABNORMAL HIGH (ref 70–99)
Glucose-Capillary: 194 mg/dL — ABNORMAL HIGH (ref 70–99)

## 2013-08-22 LAB — PROTIME-INR: Prothrombin Time: 15.9 seconds — ABNORMAL HIGH (ref 11.6–15.2)

## 2013-08-22 MED ORDER — WARFARIN SODIUM 10 MG PO TABS
10.0000 mg | ORAL_TABLET | ORAL | Status: AC
  Administered 2013-08-22: 10 mg via ORAL
  Filled 2013-08-22 (×2): qty 1

## 2013-08-22 MED ORDER — WARFARIN - PHARMACIST DOSING INPATIENT
Freq: Every day | Status: DC
  Administered 2013-08-22 – 2013-08-23 (×2)

## 2013-08-22 NOTE — Progress Notes (Signed)
[facility] PROGRESS NOTE  [name] WUJ:[id number] DOB: 12/15/1940 DOA: 08/17/2013 PCP: Michiel Sites, MD  HPI: 72 y.o. female with history of CAD, CABG, dyslipidemia, hypertension, IDDM, stage IV chronic kidney disease, chronic lung scarring on CT chest, ischemic cardiomyopathy with EF 44%, OSA on CPAP, COPD, on Coumadin anticoagulation who presented to the ED with worsening dyspnea. Patient claimed compliance with medications but not compliant with salt restricted diet. She gave 3 day history of progressively worsening dyspnea on exertion, orthopnea, PND. She denied chest pain or worsening leg edema. She stated that walking even a few steps brought on dyspnea. She only is able to sleep for 2 hours at night and has to wakeup secondary to dyspnea. In the ED, chest x-ray suggestive of CHF, creatinine worse >4 which was 2.8 in April. In reviewing her home med rec: was on Lasix only when necessary and seemed to be on dual ACE inhibitors. Patient has been started on IV nitroglycerin drip and hospitalist admission requested.  Assessment/Plan: Acute on stage IV chronic kidney disease  In April 2014 was 2.8 - Nephrology following with [name]- diuresing well- lasix switched to PO now  - patient would want to wait on dyalisis cath placement until she talks to Dr. Darrick Penna. He will be back in 1 week. Resume Coumadin today and consider discharge when INR therapeutic. No acute dialysis needs.  Acute on chronic systolic CHF / ischemic cardiomyopathy / CAD / CABG 2009  - resolved  Lasix switched to PO - oxygen saturations on room air 100%  Accelerated hypertension  BP control improved, Nitro infiltration into R forearm / hand  Stable  Uncontrolled IDDM with renal complications  CBG currently reasonably controlled - no change in treatment plan  OSA  Continue nightly CPAP regimen  COPD  Well compensated at the present time  Anemia  Likely secondary to chronic kidney disease - follow trend   Chronic anticoagulation - PVD + hx DVT  INR is not at goal - initiate heparin gtt until INR at goal  PVD s/p left femoral-popliteal bypass graft Sept 2012  Diet: renal Fluids: none DVT Prophylaxis: heparin   Code Status: DNR Family Communication: none  Disposition Plan: inpatient, home when ready  Consultants:  Nephrology  Procedures:  None    Antibiotics  Anti-infectives   None     Antibiotics Given (last 72 hours)   None      HPI/Subjective: - no complaints  Objective: Filed Vitals:   08/21/13 1412 08/21/13 1651 08/21/13 1954 08/22/13 0440  BP: 133/43 147/45 144/45 143/41  Pulse: 58 59 59 61  Temp: 97.9 F (36.6 C)  97.7 F (36.5 C) 97.9 F (36.6 C)  TempSrc: Oral  Oral Oral  Resp: 20  20 20   Height:      Weight:    62.6 kg (138 lb 0.1 oz)  SpO2: 99%  98% 97%    Intake/Output Summary (Last 24 hours) at 08/22/13 1038 Last data filed at 08/22/13 0600  Gross per 24 hour  Intake    796 ml  Output   1400 ml  Net   -604 ml   Filed Weights   08/20/13 1516 08/21/13 0454 08/22/13 0440  Weight: 66.2 kg (145 lb 15.1 oz) 63.413 kg (139 lb 12.8 oz) 62.6 kg (138 lb 0.1 oz)    Exam:   General:  NAD  Cardiovascular: regular rate and rhythm, without MRG  Respiratory: good air movement, clear to auscultation throughout, no wheezing, ronchi or rales  Abdomen:  soft, not tender to palpation, positive bowel sounds  MSK: no peripheral edema  Neuro: CN 2-12 grossly intact, MS 5/5 in all 4  Data Reviewed: Basic Metabolic Panel:  Recent Labs Lab 08/18/13 0452 08/19/13 0143 08/20/13 0500 08/21/13 0441 08/22/13 0402  NA 141 137 139 140 139  K 4.2 3.7 3.9 4.1 3.8  CL 109 105 104 104 102  CO2 19 19 22 21 22   GLUCOSE 165* 97 99 122* 179*  BUN 63* 61* 56* 56* 53*  CREATININE 5.00* 5.17* 5.16* 5.38* 5.56*  CALCIUM 8.4 8.6 8.6 8.7 8.6  PHOS 4.3  --  4.8* 4.6 4.1   Liver Function Tests:  Recent Labs Lab 08/18/13 0452 08/20/13 0500 08/21/13 0441  08/22/13 0402  ALBUMIN 2.2* 2.1* 2.1* 2.1*   CBC:  Recent Labs Lab 08/17/13 2343 08/19/13 0143 08/20/13 0455 08/21/13 0441 08/22/13 0402  WBC 7.9 7.8 5.8 8.4 8.8  HGB 7.8* 8.0* 8.4* 8.7* 8.8*  HCT 23.7* 24.5* 25.3* 26.4* 27.1*  MCV 82.9 83.1 82.4 82.0 83.6  PLT 216 226 230 239 239   Cardiac Enzymes:  Recent Labs Lab 08/17/13 2000 08/17/13 2343 08/18/13 0701  TROPONINI <0.30 <0.30 <0.30   BNP (last 3 results)  Recent Labs  08/17/13 1603  PROBNP 28521.0*   CBG:  Recent Labs Lab 08/21/13 0621 08/21/13 1105 08/21/13 1651 08/21/13 2113 08/22/13 0634  GLUCAP 115* 305* 171* 156* 157*    Recent Results (from the past 240 hour(s))  MRSA PCR SCREENING     Status: None   Collection Time    08/17/13  7:05 PM      Result Value Range Status   MRSA by PCR NEGATIVE  NEGATIVE Final   Comment:            The GeneXpert MRSA Assay (FDA     approved for NASAL specimens     only), is one component of a     comprehensive MRSA colonization     surveillance program. It is not     intended to diagnose MRSA     infection nor to guide or     monitor treatment for     MRSA infections.     Studies: No results found.  Scheduled Meds: . amLODipine  10 mg Oral Daily  . atorvastatin  40 mg Oral Daily  . calcitRIOL  0.25 mcg Oral Daily  . carvedilol  12.5 mg Oral BID WC  . cloNIDine  0.1 mg Oral BID  . clopidogrel  75 mg Oral Daily  . darbepoetin (ARANESP) injection - NON-DIALYSIS  100 mcg Subcutaneous Q Wed-1800  . ferrous sulfate  325 mg Oral BID  . folic acid  1 mg Oral Daily  . furosemide  160 mg Oral TID  . insulin aspart  0-5 Units Subcutaneous QHS  . insulin aspart  0-9 Units Subcutaneous TID WC  . insulin aspart  6 Units Subcutaneous TID WC  . insulin glargine  10 Units Subcutaneous QHS  . niacin  500 mg Oral QHS  . omega-3 acid ethyl esters  1 g Oral QID  . pantoprazole  40 mg Oral Daily  . polyethylene glycol  17 g Oral Daily  . senna  1 tablet Oral QHS   . sodium chloride  3 mL Intravenous Q12H   Continuous Infusions: . heparin 900 Units/hr (08/22/13 0330)    Principal Problem:   Acute on chronic systolic CHF (congestive heart failure) Active Problems:   PVD (peripheral vascular disease)  S/P CABG x 5   COPD (chronic obstructive pulmonary disease)   OSA on CPAP   Essential hypertension   Renal failure, acute on chronic stage 4   Anemia  Time spent: 25  Pamella Pert, MD [facility] Pager [phone]. If 7 PM - 7 AM, please contact night-coverage at [email or web address], password [facility] 08/22/2013, 10:38 AM  LOS: 5 days

## 2013-08-22 NOTE — Progress Notes (Signed)
Pt is refusing CPAP tonight. RT will monitor + 

## 2013-08-22 NOTE — Progress Notes (Signed)
CSW received consult for assistance with CPAP, RN CM working with pt regarding needs. .No further Clinical Social Work needs, signing off.   Catha Gosselin, LCSW weekend covering CSW  [phone] .08/22/2013 10:25am

## 2013-08-22 NOTE — Progress Notes (Signed)
Patient refused to wear cpap tonight. RT will continue to monitor. 

## 2013-08-22 NOTE — Progress Notes (Signed)
S: .  No uremic Sxs  No new co O:BP 143/41  Pulse 61  Temp(Src) 97.9 F (36.6 C) (Oral)  Resp 20  Ht 5\' 3"  (1.6 m)  Wt 62.6 kg (138 lb 0.1 oz)  BMI 24.45 kg/m2  SpO2 97%  Intake/Output Summary (Last 24 hours) at 08/22/13 0904 Last data filed at 08/22/13 0600  Gross per 24 hour  Intake    796 ml  Output   1400 ml  Net   -604 ml   Weight change: -3.6 kg (-7 lb 15 oz) ZOX:WRUEA and alert CVS:RRR Resp: improved basilar crackles Abd:+ BS NTND Ext: 0-tr edema NEURO: CNI Ox3 No asterixis   . amLODipine  10 mg Oral Daily  . atorvastatin  40 mg Oral Daily  . calcitRIOL  0.25 mcg Oral Daily  . carvedilol  12.5 mg Oral BID WC  . cloNIDine  0.1 mg Oral BID  . clopidogrel  75 mg Oral Daily  . darbepoetin (ARANESP) injection - NON-DIALYSIS  100 mcg Subcutaneous Q Wed-1800  . ferrous sulfate  325 mg Oral BID  . folic acid  1 mg Oral Daily  . furosemide  160 mg Oral TID  . insulin aspart  0-5 Units Subcutaneous QHS  . insulin aspart  0-9 Units Subcutaneous TID WC  . insulin aspart  6 Units Subcutaneous TID WC  . insulin glargine  10 Units Subcutaneous QHS  . niacin  500 mg Oral QHS  . omega-3 acid ethyl esters  1 g Oral QID  . pantoprazole  40 mg Oral Daily  . polyethylene glycol  17 g Oral Daily  . senna  1 tablet Oral QHS  . sodium chloride  3 mL Intravenous Q12H   No results found. BMET    Component Value Date/Time   NA 139 08/22/2013 0402   K 3.8 08/22/2013 0402   CL 102 08/22/2013 0402   CO2 22 08/22/2013 0402   GLUCOSE 179* 08/22/2013 0402   BUN 53* 08/22/2013 0402   CREATININE 5.56* 08/22/2013 0402   CALCIUM 8.6 08/22/2013 0402   CALCIUM 8.7 07/01/2011 1831   GFRNONAA 7* 08/22/2013 0402   GFRAA 8* 08/22/2013 0402   CBC    Component Value Date/Time   WBC 8.8 08/22/2013 0402   RBC 3.24* 08/22/2013 0402   HGB 8.8* 08/22/2013 0402   HCT 27.1* 08/22/2013 0402   PLT 239 08/22/2013 0402   MCV 83.6 08/22/2013 0402   MCH 27.2 08/22/2013 0402   MCHC 32.5 08/22/2013 0402   RDW  14.6 08/22/2013 0402   LYMPHSABS 2.9 06/21/2011 0612   MONOABS 0.5 06/21/2011 0612   EOSABS 0.4 06/21/2011 0612   BASOSABS 0.0 06/21/2011 0612     Assessment: 1. 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 Volume overload, improved 3 Anemia and iron def, SP IV iron and on aranesp 4 DM 5. HTN, BP better controlled 6. Sec HPTH on calcitriol   Plan: 1. She says she wants to talk with Dr Deterding before having access placed but he is OOT next week .  I told her I have already spoken to him regarding an access and he agrees that it should be placed.  I think she is making things much more difficult for herself by not having it placed now.  Thus, recommend resuming coumadin and DC when INR therapeutic. [name]

## 2013-08-22 NOTE — Progress Notes (Addendum)
PHARMACY CONSULT NOTE   Pharmacy Consult for :  Coumadin with Heparin bridging  Indication: Severe PVD, VTE treatment, hx DVT  Dosing weight 66 kg  Labs:  Recent Labs  08/20/13 0455 08/20/13 0500 08/21/13 0441 08/22/13 0402  HGB 8.4*  --  8.7* 8.8*  HCT 25.3*  --  26.4* 27.1*  PLT 230  --  239 239  INR 1.72*  --  1.61* 1.30  HEPARINUNFRC 0.60  --  0.46 0.41  CREATININE  --  5.16* 5.38* 5.56*   Lab Results  Component Value Date   INR 1.30 08/22/2013   INR 1.61* 08/21/2013   INR 1.72* 08/20/2013   Estimated Creatinine Clearance: 7.7 ml/min (by C-G formula based on Cr of 5.56).  Medications:  Scheduled:  . amLODipine  10 mg Oral Daily  . atorvastatin  40 mg Oral Daily  . calcitRIOL  0.25 mcg Oral Daily  . carvedilol  12.5 mg Oral BID WC  . cloNIDine  0.1 mg Oral BID  . clopidogrel  75 mg Oral Daily  . darbepoetin (ARANESP) injection - NON-DIALYSIS  100 mcg Subcutaneous Q Wed-1800  . ferrous sulfate  325 mg Oral BID  . folic acid  1 mg Oral Daily  . furosemide  160 mg Oral TID  . insulin aspart  0-5 Units Subcutaneous QHS  . insulin aspart  0-9 Units Subcutaneous TID WC  . insulin aspart  6 Units Subcutaneous TID WC  . insulin glargine  10 Units Subcutaneous QHS  . niacin  500 mg Oral QHS  . omega-3 acid ethyl esters  1 g Oral QID  . pantoprazole  40 mg Oral Daily  . polyethylene glycol  17 g Oral Daily  . senna  1 tablet Oral QHS  . sodium chloride  3 mL Intravenous Q12H   Infusions:  . heparin 900 Units/hr (08/22/13 0330)    Assessment:  72 y/o female on Coumadin with Heparin bridging for severe PVD and history of DVT.  Coumadin restarting due to patient decision not to place new HD catheter at this time.  Heparin rate 900 units/hr.  Heparin level within therapeutic range 0.41.  No bleeding complications noted   INR subtherpeutic 1.3.  No Coumadin administered last PM.   Goal of Therapy:   INR 2-3  Heparin level 0.3-0.7 units/ml   Plan:   1. Continue Heparin bridging at 900 units/hr until INR therapeutic. 2. Coumadin 10 mg po now. Daily Heparin Levels, INR, CBC.  Monitor for bleeding complications.   Chon Buhl, Elisha Headland, Pharm.D. 08/22/2013, 10:54 AM

## 2013-08-23 LAB — RENAL FUNCTION PANEL
BUN: 55 mg/dL — ABNORMAL HIGH (ref 6–23)
Calcium: 8.9 mg/dL (ref 8.4–10.5)
Chloride: 99 mEq/L (ref 96–112)
Creatinine, Ser: 5.87 mg/dL — ABNORMAL HIGH (ref 0.50–1.10)
GFR calc Af Amer: 8 mL/min — ABNORMAL LOW (ref >90)
GFR calc non Af Amer: 7 mL/min — ABNORMAL LOW (ref >90)
Glucose, Bld: 146 mg/dL — ABNORMAL HIGH (ref 70–99)
Phosphorus: 4.2 mg/dL (ref 2.3–4.6)
Sodium: 137 mEq/L (ref 135–145)

## 2013-08-23 LAB — CBC
HCT: 26.7 % — ABNORMAL LOW (ref 36.0–46.0)
MCH: 27.6 pg (ref 26.0–34.0)
MCHC: 33 g/dL (ref 30.0–36.0)
Platelets: 265 10*3/uL (ref 150–400)
RBC: 3.19 MIL/uL — ABNORMAL LOW (ref 3.87–5.11)
WBC: 9.3 10*3/uL (ref 4.0–10.5)

## 2013-08-23 LAB — PROTIME-INR
INR: 1.31 (ref 0.00–1.49)
Prothrombin Time: 16 seconds — ABNORMAL HIGH (ref 11.6–15.2)

## 2013-08-23 LAB — GLUCOSE, CAPILLARY: Glucose-Capillary: 238 mg/dL — ABNORMAL HIGH (ref 70–99)

## 2013-08-23 LAB — HEPARIN LEVEL (UNFRACTIONATED): Heparin Unfractionated: 0.64 IU/mL (ref 0.30–0.70)

## 2013-08-23 MED ORDER — WARFARIN SODIUM 7.5 MG PO TABS
7.5000 mg | ORAL_TABLET | Freq: Once | ORAL | Status: AC
  Administered 2013-08-23: 7.5 mg via ORAL
  Filled 2013-08-23: qty 1

## 2013-08-23 NOTE — Progress Notes (Signed)
Patient continues to refuse CPAP. RT will continue to monitor. 

## 2013-08-23 NOTE — Progress Notes (Signed)
Pt ambulated in the hallway from her room to room 30 and back to her room, pt tolerated it fine with no issues.

## 2013-08-23 NOTE — Progress Notes (Signed)
Physical Therapy Treatment Patient Details Name: [name] MRN: [id number] DOB: 1941/02/26 Today's Date: 08/23/2013 Time: 0812-0828 PT Time Calculation (min): 16 min  PT Assessment / Plan / Recommendation  History of Present Illness 72 y.o. female admitted to [facility] on 08/17/13 with history of CAD, CABG, her dyslipidemia, hypertension, IDDM, stage IV chronic kidney disease, chronic lung scarring on CT chest, ischemic cardiomyopathy with EF 44%, OSA on CPAP, COPD, on Coumadin anticoagulation-? Etiology, presenting this admission with dyspnea/SOB.     PT Comments   Pt moving well but she states she does not feel she is at baseline with mobility.  Encouraged pt to increase activity with Nsing.     Follow Up Recommendations  No PT follow up     Does the patient have the potential to tolerate intense rehabilitation     Barriers to Discharge        Equipment Recommendations  None recommended by PT    Recommendations for Other Services    Frequency Min 3X/week   Progress towards PT Goals Progress towards PT goals: Progressing toward goals  Plan Current plan remains appropriate    Precautions / Restrictions Restrictions Weight Bearing Restrictions: No   Pertinent Vitals/Pain No pain reported.     Mobility  Bed Mobility Bed Mobility: Not assessed Details for Bed Mobility Assistance: Pt sitting on BSC upon arrival Transfers Transfers: Sit to Stand;Stand to Sit Sit to Stand: 6: Modified independent (Device/Increase time);With upper extremity assist;With armrests;From chair/3-in-1;From bed Stand to Sit: 6: Modified independent (Device/Increase time);With upper extremity assist;With armrests;To chair/3-in-1;To bed Ambulation/Gait Ambulation/Gait Assistance: 5: Supervision Ambulation Distance (Feet): 400 Feet Assistive device: None Ambulation/Gait Assistance Details: Pt performed various gait challenges such as head turns in all directions, directional changes, & sudden stops.  No  overt LOB but with mild staggering gait & decreased speed.  When encouraged to increase speed pt states "i can do it" Gait Pattern: Step-through pattern;Decreased stride length Gait velocity: decreased Stairs: No Wheelchair Mobility Wheelchair Mobility: No      PT Goals (current goals can now be found in the care plan section) Acute Rehab PT Goals PT Goal Formulation: With patient Time For Goal Achievement: 09/03/13 Potential to Achieve Goals: Good  Visit Information  Last PT Received On: 08/23/13 Assistance Needed: +1 History of Present Illness: 72 y.o. female admitted to [facility] on 08/17/13 with history of CAD, CABG, her dyslipidemia, hypertension, IDDM, stage IV chronic kidney disease, chronic lung scarring on CT chest, ischemic cardiomyopathy with EF 44%, OSA on CPAP, COPD, on Coumadin anticoagulation-? Etiology, presenting this admission with dyspnea/SOB.      Subjective Data      Cognition  Cognition Arousal/Alertness: Awake/alert Behavior During Therapy: WFL for tasks assessed/performed Overall Cognitive Status: Within Functional Limits for tasks assessed    Balance     End of Session PT - End of Session Equipment Utilized During Treatment: Gait belt Activity Tolerance: Patient tolerated treatment well Patient left: in chair;with call bell/phone within reach Nurse Communication: Mobility status   GP     Lara Mulch 08/23/2013, 2:30 PM   Verdell Face, PTA [phone] 08/23/2013

## 2013-08-23 NOTE — Progress Notes (Signed)
[facility] ROUNDING NOTE   Subjective:   Interval History: no complaints  Objective:  Vital signs in last 24 hours:  Temp:  [97.5 F (36.4 C)-97.9 F (36.6 C)] 97.6 F (36.4 C) (11/10 0400) Pulse Rate:  [56-66] 56 (11/10 0931) Resp:  [20] 20 (11/10 0400) BP: (129-148)/(28-50) 136/50 mmHg (11/10 0931) SpO2:  [97 %-99 %] 99 % (11/10 0400) Weight:  [61.9 kg (136 lb 7.4 oz)] 61.9 kg (136 lb 7.4 oz) (11/10 0400)  Weight change: -0.7 kg (-1 lb 8.7 oz) Filed Weights   08/21/13 0454 08/22/13 0440 08/23/13 0400  Weight: 63.413 kg (139 lb 12.8 oz) 62.6 kg (138 lb 0.1 oz) 61.9 kg (136 lb 7.4 oz)    Intake/Output: I/O last 3 completed shifts: In: 1612 [P.O.:1180; I.V.:432] Out: 2902 [Urine:2900; Stool:2]   Intake/Output this shift:  Total I/O In: 360 [P.O.:360] Out: 300 [Urine:300]  CVS- RRR RS- CTA ABD- BS present soft non-distended EXT- no edema   Basic Metabolic Panel:  Recent Labs Lab 08/18/13 0452 08/19/13 0143 08/20/13 0500 08/21/13 0441 08/22/13 0402 08/23/13 0354  NA 141 137 139 140 139 137  K 4.2 3.7 3.9 4.1 3.8 3.7  CL 109 105 104 104 102 99  CO2 19 19 22 21 22 24   GLUCOSE 165* 97 99 122* 179* 146*  BUN 63* 61* 56* 56* 53* 55*  CREATININE 5.00* 5.17* 5.16* 5.38* 5.56* 5.87*  CALCIUM 8.4 8.6 8.6 8.7 8.6 8.9  PHOS 4.3  --  4.8* 4.6 4.1 4.2    Liver Function Tests:  Recent Labs Lab 08/18/13 0452 08/20/13 0500 08/21/13 0441 08/22/13 0402 08/23/13 0354  ALBUMIN 2.2* 2.1* 2.1* 2.1* 2.3*   No results found for this basename: LIPASE, AMYLASE,  in the last 168 hours No results found for this basename: AMMONIA,  in the last 168 hours  CBC:  Recent Labs Lab 08/19/13 0143 08/20/13 0455 08/21/13 0441 08/22/13 0402 08/23/13 0354  WBC 7.8 5.8 8.4 8.8 9.3  HGB 8.0* 8.4* 8.7* 8.8* 8.8*  HCT 24.5* 25.3* 26.4* 27.1* 26.7*  MCV 83.1 82.4 82.0 83.6 83.7  PLT 226 230 239 239 265    Cardiac Enzymes:  Recent Labs Lab 08/17/13 2000  08/17/13 2343 08/18/13 0701  TROPONINI <0.30 <0.30 <0.30    BNP: No components found with this basename: POCBNP,   CBG:  Recent Labs Lab 08/22/13 1123 08/22/13 1547 08/22/13 2107 08/23/13 0046 08/23/13 0606  GLUCAP 210* 194* 176* 151* 142*    Microbiology: Results for orders placed during the hospital encounter of 08/17/13  MRSA PCR SCREENING     Status: None   Collection Time    08/17/13  7:05 PM      Result Value Range Status   MRSA by PCR NEGATIVE  NEGATIVE Final   Comment:            The GeneXpert MRSA Assay (FDA     approved for NASAL specimens     only), is one component of a     comprehensive MRSA colonization     surveillance program. It is not     intended to diagnose MRSA     infection nor to guide or     monitor treatment for     MRSA infections.    Coagulation Studies:  Recent Labs  08/21/13 0441 08/22/13 0402 08/23/13 0354  LABPROT 18.7* 15.9* 16.0*  INR 1.61* 1.30 1.31    Urinalysis: No results found for this basename: COLORURINE, APPERANCEUR, LABSPEC, PHURINE, GLUCOSEU,  HGBUR, BILIRUBINUR, KETONESUR, PROTEINUR, UROBILINOGEN, NITRITE, LEUKOCYTESUR,  in the last 72 hours    Imaging: No results found.   Medications:   . heparin 800 Units/hr (08/23/13 0855)   . amLODipine  10 mg Oral Daily  . atorvastatin  40 mg Oral Daily  . calcitRIOL  0.25 mcg Oral Daily  . carvedilol  12.5 mg Oral BID WC  . cloNIDine  0.1 mg Oral BID  . clopidogrel  75 mg Oral Daily  . darbepoetin (ARANESP) injection - NON-DIALYSIS  100 mcg Subcutaneous Q Wed-1800  . ferrous sulfate  325 mg Oral BID  . folic acid  1 mg Oral Daily  . furosemide  160 mg Oral TID  . insulin aspart  0-5 Units Subcutaneous QHS  . insulin aspart  0-9 Units Subcutaneous TID WC  . insulin aspart  6 Units Subcutaneous TID WC  . insulin glargine  10 Units Subcutaneous QHS  . niacin  500 mg Oral QHS  . omega-3 acid ethyl esters  1 g Oral QID  . pantoprazole  40 mg Oral Daily  .  polyethylene glycol  17 g Oral Daily  . senna  1 tablet Oral QHS  . sodium chloride  3 mL Intravenous Q12H  . warfarin  7.5 mg Oral ONCE-1800  . Warfarin - Pharmacist Dosing Inpatient   Does not apply q1800   acetaminophen, acetaminophen, albuterol, nitroGLYCERIN, ondansetron (ZOFRAN) IV, ondansetron, sodium chloride  Assessment/ Plan:  72 y.o. female with history of CAD, CABG, dyslipidemia, hypertension, IDDM, stage IV chronic kidney disease, chronic lung scarring on CT chest, ischemic cardiomyopathy with EF 44%, OSA on CPAP, COPD, on Coumadin anticoagulation    CKD 5  Patient refuses access in hospital and wished further discussions with Nephrologists Dr Darrick Penna.  Anemia Darbepoietin  Hypertension controlled  Will sign off nothing further to add     LOS: 6 [name] W @TODAY @10 :31 AM

## 2013-08-23 NOTE — Progress Notes (Signed)
[facility] PROGRESS NOTE  [name] ZOX:[id number] DOB: 03/28/1941 DOA: 08/17/2013 PCP: Michiel Sites, MD  Assessment/Plan: Acute on stage IV chronic kidney disease  In April 2014 was 2.8 - Nephrology following with [name]- diuresing well- lasix switched to PO now  - patient would want to wait on dyalisis cath placement until she talks to Dr. Darrick Penna. He will be back in 1 week. - back on Coumadin and can be discharged once INR therapeutic. Acute on chronic systolic CHF / ischemic cardiomyopathy / CAD / CABG 2009  - stable Accelerated hypertension  BP control improved, Nitro infiltration into R forearm / hand  Stable  Uncontrolled IDDM with renal complications  CBG currently reasonably controlled - no change in treatment plan  OSA  Continue nightly CPAP regimen  COPD  Well compensated at the present time  Anemia  Likely secondary to chronic kidney disease - follow trend  Chronic anticoagulation - PVD + hx DVT  INR is not at goal - initiate heparin gtt until INR at goal  PVD s/p left femoral-popliteal bypass graft Sept 2012  Diet: renal Fluids: none DVT Prophylaxis: heparin   Code Status: DNR Family Communication: none  Disposition Plan: inpatient, home when ready  Consultants:  Nephrology  Procedures:  None    Antibiotics  Anti-infectives   None     Antibiotics Given (last 72 hours)   None      HPI/Subjective: - no complaints  Objective: Filed Vitals:   08/22/13 1456 08/22/13 1956 08/23/13 0400 08/23/13 0931  BP: 129/49 148/28 136/49 136/50  Pulse: 66 57 59 56  Temp: 97.9 F (36.6 C) 97.5 F (36.4 C) 97.6 F (36.4 C)   TempSrc: Oral Oral Oral   Resp: 20 20 20    Height:      Weight:   61.9 kg (136 lb 7.4 oz)   SpO2: 97% 98% 99%     Intake/Output Summary (Last 24 hours) at 08/23/13 1321 Last data filed at 08/23/13 1207  Gross per 24 hour  Intake   1516 ml  Output   1902 ml  Net   -386 ml   Filed Weights   08/21/13 0454  08/22/13 0440 08/23/13 0400  Weight: 63.413 kg (139 lb 12.8 oz) 62.6 kg (138 lb 0.1 oz) 61.9 kg (136 lb 7.4 oz)    Exam:   General:  NAD  Cardiovascular: regular rate and rhythm, without MRG  Respiratory: good air movement, clear to auscultation throughout, no wheezing, ronchi or rales  Abdomen: soft, not tender to palpation, positive bowel sounds  MSK: no peripheral edema  Neuro: CN 2-12 grossly intact, MS 5/5 in all 4  Data Reviewed: Basic Metabolic Panel:  Recent Labs Lab 08/18/13 0452 08/19/13 0143 08/20/13 0500 08/21/13 0441 08/22/13 0402 08/23/13 0354  NA 141 137 139 140 139 137  K 4.2 3.7 3.9 4.1 3.8 3.7  CL 109 105 104 104 102 99  CO2 19 19 22 21 22 24   GLUCOSE 165* 97 99 122* 179* 146*  BUN 63* 61* 56* 56* 53* 55*  CREATININE 5.00* 5.17* 5.16* 5.38* 5.56* 5.87*  CALCIUM 8.4 8.6 8.6 8.7 8.6 8.9  PHOS 4.3  --  4.8* 4.6 4.1 4.2   Liver Function Tests:  Recent Labs Lab 08/18/13 0452 08/20/13 0500 08/21/13 0441 08/22/13 0402 08/23/13 0354  ALBUMIN 2.2* 2.1* 2.1* 2.1* 2.3*   CBC:  Recent Labs Lab 08/19/13 0143 08/20/13 0455 08/21/13 0441 08/22/13 0402 08/23/13 0354  WBC 7.8 5.8 8.4 8.8 9.3  HGB 8.0* 8.4* 8.7* 8.8* 8.8*  HCT 24.5* 25.3* 26.4* 27.1* 26.7*  MCV 83.1 82.4 82.0 83.6 83.7  PLT 226 230 239 239 265   Cardiac Enzymes:  Recent Labs Lab 08/17/13 2000 08/17/13 2343 08/18/13 0701  TROPONINI <0.30 <0.30 <0.30   BNP (last 3 results)  Recent Labs  08/17/13 1603  PROBNP 28521.0*   CBG:  Recent Labs Lab 08/22/13 1547 08/22/13 2107 08/23/13 0046 08/23/13 0606 08/23/13 1142  GLUCAP 194* 176* 151* 142* 238*    Recent Results (from the past 240 hour(s))  MRSA PCR SCREENING     Status: None   Collection Time    08/17/13  7:05 PM      Result Value Range Status   MRSA by PCR NEGATIVE  NEGATIVE Final   Comment:            The GeneXpert MRSA Assay (FDA     approved for NASAL specimens     only), is one component of a      comprehensive MRSA colonization     surveillance program. It is not     intended to diagnose MRSA     infection nor to guide or     monitor treatment for     MRSA infections.     Studies: No results found.  Scheduled Meds: . amLODipine  10 mg Oral Daily  . atorvastatin  40 mg Oral Daily  . calcitRIOL  0.25 mcg Oral Daily  . carvedilol  12.5 mg Oral BID WC  . cloNIDine  0.1 mg Oral BID  . clopidogrel  75 mg Oral Daily  . darbepoetin (ARANESP) injection - NON-DIALYSIS  100 mcg Subcutaneous Q Wed-1800  . ferrous sulfate  325 mg Oral BID  . folic acid  1 mg Oral Daily  . furosemide  160 mg Oral TID  . insulin aspart  0-5 Units Subcutaneous QHS  . insulin aspart  0-9 Units Subcutaneous TID WC  . insulin aspart  6 Units Subcutaneous TID WC  . insulin glargine  10 Units Subcutaneous QHS  . niacin  500 mg Oral QHS  . omega-3 acid ethyl esters  1 g Oral QID  . pantoprazole  40 mg Oral Daily  . polyethylene glycol  17 g Oral Daily  . senna  1 tablet Oral QHS  . sodium chloride  3 mL Intravenous Q12H  . warfarin  7.5 mg Oral ONCE-1800  . Warfarin - Pharmacist Dosing Inpatient   Does not apply q1800   Continuous Infusions: . heparin 800 Units/hr (08/23/13 0855)    Principal Problem:   Acute on chronic systolic CHF (congestive heart failure) Active Problems:   PVD (peripheral vascular disease)   S/P CABG x 5   COPD (chronic obstructive pulmonary disease)   OSA on CPAP   Essential hypertension   Renal failure, acute on chronic stage 4   Anemia  Time spent: 25  Pamella Pert, MD [facility] Pager [phone]. If 7 PM - 7 AM, please contact night-coverage at [email or web address], password [facility] 08/23/2013, 1:23 PM  LOS: 6 days

## 2013-08-23 NOTE — Progress Notes (Signed)
PHARMACY CONSULT NOTE   Pharmacy Consult for :  Coumadin  - Heparin bridging  Indication: Severe PVD  Dosing weight 66 kg  Labs:  Recent Labs  08/21/13 0441 08/22/13 0402 08/23/13 0354 08/23/13 0615  HGB 8.7* 8.8* 8.8*  --   HCT 26.4* 27.1* 26.7*  --   PLT 239 239 265  --   INR 1.61* 1.30 1.31  --   HEPARINUNFRC 0.46 0.41 0.93* 0.64  CREATININE 5.38* 5.56* 5.87*  --    Lab Results  Component Value Date   INR 1.31 08/23/2013   INR 1.30 08/22/2013   INR 1.61* 08/21/2013   Estimated Creatinine Clearance: 7.3 ml/min (by C-G formula based on Cr of 5.87).  Medications:  Scheduled:  . amLODipine  10 mg Oral Daily  . atorvastatin  40 mg Oral Daily  . calcitRIOL  0.25 mcg Oral Daily  . carvedilol  12.5 mg Oral BID WC  . cloNIDine  0.1 mg Oral BID  . clopidogrel  75 mg Oral Daily  . darbepoetin (ARANESP) injection - NON-DIALYSIS  100 mcg Subcutaneous Q Wed-1800  . ferrous sulfate  325 mg Oral BID  . folic acid  1 mg Oral Daily  . furosemide  160 mg Oral TID  . insulin aspart  0-5 Units Subcutaneous QHS  . insulin aspart  0-9 Units Subcutaneous TID WC  . insulin aspart  6 Units Subcutaneous TID WC  . insulin glargine  10 Units Subcutaneous QHS  . niacin  500 mg Oral QHS  . omega-3 acid ethyl esters  1 g Oral QID  . pantoprazole  40 mg Oral Daily  . polyethylene glycol  17 g Oral Daily  . senna  1 tablet Oral QHS  . sodium chloride  3 mL Intravenous Q12H  . Warfarin - Pharmacist Dosing Inpatient   Does not apply q1800   Infusions:  . heparin 900 Units/hr (08/23/13 0408)   Assessment:  72 y/o female on Coumadin with Heparin bridging for severe PVD.  Coumadin was resumed last night with plans to discharge patient with therapeutic INR.    Heparin level is therapeutic at 0.64 on IV rate of 900 units/hr.  Heparin level re-drawn below infusion site.  INR 1.31 and has dropped after holding doses.  She received 10mg  last evening, therefore would expect an up trend soon.  No  bleeding complications noted   Goal of Therapy:   Heparin level 0.3-0.7 units/ml  INR 2.0-3.0   Plan:  1. Coumadin 7.5mg  x 1 today. 2. Decrease Heparin infusion to 800 units/hr.  Monitor for bleeding complications  Daily Heparin level, INR, CBC.   Nadara Mustard, PharmD., MS Clinical Pharmacist Pager:  [phone] Thank you for allowing pharmacy to be part of this patients care team. 08/23/2013, 8:42 AM

## 2013-08-24 LAB — HEPARIN LEVEL (UNFRACTIONATED)
Heparin Unfractionated: 0.23 IU/mL — ABNORMAL LOW (ref 0.30–0.70)
Heparin Unfractionated: 0.4 IU/mL (ref 0.30–0.70)

## 2013-08-24 LAB — CBC
MCH: 27.5 pg (ref 26.0–34.0)
MCHC: 32.7 g/dL (ref 30.0–36.0)
MCV: 84.2 fL (ref 78.0–100.0)
Platelets: 280 10*3/uL (ref 150–400)
RBC: 3.49 MIL/uL — ABNORMAL LOW (ref 3.87–5.11)
RDW: 15.1 % (ref 11.5–15.5)

## 2013-08-24 LAB — RENAL FUNCTION PANEL
CO2: 22 mEq/L (ref 19–32)
Calcium: 8.8 mg/dL (ref 8.4–10.5)
Chloride: 97 mEq/L (ref 96–112)
Creatinine, Ser: 5.9 mg/dL — ABNORMAL HIGH (ref 0.50–1.10)
GFR calc Af Amer: 8 mL/min — ABNORMAL LOW (ref >90)
GFR calc non Af Amer: 6 mL/min — ABNORMAL LOW (ref >90)
Phosphorus: 4.5 mg/dL (ref 2.3–4.6)

## 2013-08-24 LAB — GLUCOSE, CAPILLARY
Glucose-Capillary: 250 mg/dL — ABNORMAL HIGH (ref 70–99)
Glucose-Capillary: 197 mg/dL — ABNORMAL HIGH (ref 70–99)

## 2013-08-24 LAB — PROTIME-INR
INR: 1.51 — ABNORMAL HIGH (ref 0.00–1.49)
Prothrombin Time: 17.8 seconds — ABNORMAL HIGH (ref 11.6–15.2)

## 2013-08-24 MED ORDER — CAMPHOR-MENTHOL 0.5-0.5 % EX LOTN
TOPICAL_LOTION | CUTANEOUS | Status: DC | PRN
  Filled 2013-08-24: qty 222

## 2013-08-24 MED ORDER — WARFARIN SODIUM 7.5 MG PO TABS
7.5000 mg | ORAL_TABLET | Freq: Once | ORAL | Status: AC
  Administered 2013-08-24: 17:00:00 7.5 mg via ORAL
  Filled 2013-08-24: qty 1

## 2013-08-24 MED ORDER — DIPHENHYDRAMINE HCL 50 MG PO CAPS
50.0000 mg | ORAL_CAPSULE | Freq: Once | ORAL | Status: DC
  Filled 2013-08-24: qty 1

## 2013-08-24 MED ORDER — NIACIN ER 500 MG PO CPCR
500.0000 mg | ORAL_CAPSULE | Freq: Every day | ORAL | Status: DC
  Administered 2013-08-24: 500 mg via ORAL
  Filled 2013-08-24 (×2): qty 1

## 2013-08-24 NOTE — Progress Notes (Signed)
[facility] PROGRESS NOTE  [name] ZOX:[id number] DOB: November 03, 1940 DOA: 08/17/2013 PCP: Michiel Sites, MD  Assessment/Plan: Acute on stage IV chronic kidney disease  In April 2014 was 2.8 - Nephrology following with [name]- diuresing well- lasix switched to PO now  - patient would want to wait on dyalisis cath placement until she talks to Dr. Darrick Penna. He will be back in 1 week. - back on Coumadin and can be discharged once INR therapeutic. - INR 1.5 this morning, hopeful for d/c tomorrow once closer to 2. Acute on chronic systolic CHF / ischemic cardiomyopathy / CAD / CABG 2009  - stable Accelerated hypertension  BP control improved, Nitro infiltration into R forearm / hand  Stable  Uncontrolled IDDM with renal complications  CBG currently reasonably controlled - no change in treatment plan  OSA  Continue nightly CPAP regimen  COPD  Well compensated at the present time  Anemia  Likely secondary to chronic kidney disease - follow trend  Chronic anticoagulation - PVD + hx DVT  INR is not at goal - initiate heparin gtt until INR at goal  PVD s/p left femoral-popliteal bypass graft Sept 2012  Diet: renal Fluids: none DVT Prophylaxis: heparin gtt  Code Status: DNR Family Communication: none  Disposition Plan: inpatient, home when ready, Wed likely  Consultants:  Nephrology  Procedures:  None    Antibiotics  Anti-infectives   None     Antibiotics Given (last 72 hours)   None      HPI/Subjective: - no complaints  Objective: Filed Vitals:   08/23/13 2049 08/24/13 0147 08/24/13 0540 08/24/13 0934  BP: 162/55 97/42 137/41 129/44  Pulse: 62 55 66 63  Temp: 97.7 F (36.5 C) 97.5 F (36.4 C) 98.1 F (36.7 C) 97.8 F (36.6 C)  TempSrc: Oral Oral Oral Oral  Resp: 20  18 18   Height:      Weight:   61.7 kg (136 lb 0.4 oz)   SpO2: 99% 98% 98% 99%    Intake/Output Summary (Last 24 hours) at 08/24/13 1044 Last data filed at 08/24/13 0900  Gross  per 24 hour  Intake 920.58 ml  Output   1272 ml  Net -351.42 ml   Filed Weights   08/22/13 0440 08/23/13 0400 08/24/13 0540  Weight: 62.6 kg (138 lb 0.1 oz) 61.9 kg (136 lb 7.4 oz) 61.7 kg (136 lb 0.4 oz)    Exam:   General:  NAD  Cardiovascular: regular rate and rhythm, without MRG  Respiratory: good air movement, clear to auscultation throughout, no wheezing, ronchi or rales  Abdomen: soft, not tender to palpation, positive bowel sounds  MSK: no peripheral edema  Neuro: CN 2-12 grossly intact, MS 5/5 in all 4  Data Reviewed: Basic Metabolic Panel:  Recent Labs Lab 08/20/13 0500 08/21/13 0441 08/22/13 0402 08/23/13 0354 08/24/13 0512  NA 139 140 139 137 136  K 3.9 4.1 3.8 3.7 4.4  CL 104 104 102 99 97  CO2 22 21 22 24 22   GLUCOSE 99 122* 179* 146* 183*  BUN 56* 56* 53* 55* 58*  CREATININE 5.16* 5.38* 5.56* 5.87* 5.90*  CALCIUM 8.6 8.7 8.6 8.9 8.8  PHOS 4.8* 4.6 4.1 4.2 4.5   Liver Function Tests:  Recent Labs Lab 08/20/13 0500 08/21/13 0441 08/22/13 0402 08/23/13 0354 08/24/13 0512  ALBUMIN 2.1* 2.1* 2.1* 2.3* 2.3*   CBC:  Recent Labs Lab 08/20/13 0455 08/21/13 0441 08/22/13 0402 08/23/13 0354 08/24/13 0512  WBC 5.8 8.4 8.8 9.3 10.9*  HGB 8.4* 8.7* 8.8* 8.8* 9.6*  HCT 25.3* 26.4* 27.1* 26.7* 29.4*  MCV 82.4 82.0 83.6 83.7 84.2  PLT 230 239 239 265 280   Cardiac Enzymes:  Recent Labs Lab 08/17/13 2000 08/17/13 2343 08/18/13 0701  TROPONINI <0.30 <0.30 <0.30   BNP (last 3 results)  Recent Labs  08/17/13 1603  PROBNP 28521.0*   CBG:  Recent Labs Lab 08/23/13 0606 08/23/13 1142 08/23/13 1605 08/23/13 2117 08/24/13 0636  GLUCAP 142* 238* 210* 201* 193*    Recent Results (from the past 240 hour(s))  MRSA PCR SCREENING     Status: None   Collection Time    08/17/13  7:05 PM      Result Value Range Status   MRSA by PCR NEGATIVE  NEGATIVE Final   Comment:            The GeneXpert MRSA Assay (FDA     approved for NASAL  specimens     only), is one component of a     comprehensive MRSA colonization     surveillance program. It is not     intended to diagnose MRSA     infection nor to guide or     monitor treatment for     MRSA infections.     Studies: No results found.  Scheduled Meds: . amLODipine  10 mg Oral Daily  . atorvastatin  40 mg Oral Daily  . calcitRIOL  0.25 mcg Oral Daily  . carvedilol  12.5 mg Oral BID WC  . cloNIDine  0.1 mg Oral BID  . clopidogrel  75 mg Oral Daily  . darbepoetin (ARANESP) injection - NON-DIALYSIS  100 mcg Subcutaneous Q Wed-1800  . diphenhydrAMINE  50 mg Oral Once  . ferrous sulfate  325 mg Oral BID  . folic acid  1 mg Oral Daily  . furosemide  160 mg Oral TID  . insulin aspart  0-5 Units Subcutaneous QHS  . insulin aspart  0-9 Units Subcutaneous TID WC  . insulin aspart  6 Units Subcutaneous TID WC  . insulin glargine  10 Units Subcutaneous QHS  . niacin  500 mg Oral QHS  . omega-3 acid ethyl esters  1 g Oral QID  . pantoprazole  40 mg Oral Daily  . polyethylene glycol  17 g Oral Daily  . senna  1 tablet Oral QHS  . sodium chloride  3 mL Intravenous Q12H  . Warfarin - Pharmacist Dosing Inpatient   Does not apply q1800   Continuous Infusions: . heparin 900 Units/hr (08/24/13 1324)    Principal Problem:   Acute on chronic systolic CHF (congestive heart failure) Active Problems:   PVD (peripheral vascular disease)   S/P CABG x 5   COPD (chronic obstructive pulmonary disease)   OSA on CPAP   Essential hypertension   Renal failure, acute on chronic stage 4   Anemia  Time spent: 25  Pamella Pert, MD [facility] Pager [phone]. If 7 PM - 7 AM, please contact night-coverage at [email or web address], password [facility] 08/24/2013, 10:44 AM  LOS: 7 days

## 2013-08-24 NOTE — Progress Notes (Signed)
Inpatient Diabetes Program Recommendations  AACE/ADA: New Consensus Statement on Inpatient Glycemic Control (2013)  Target Ranges:  Prepandial:   less than 140 mg/dL      Peak postprandial:   less than 180 mg/dL (1-2 hours)      Critically ill patients:  140 - 180 mg/dL   Results for [name], [name] (MRN [id number]) as of 08/24/2013 12:55  Ref. Range 08/23/2013 00:46 08/23/2013 06:06 08/23/2013 11:42 08/23/2013 16:05 08/23/2013 21:17 08/24/2013 06:36 08/24/2013 11:29  Glucose-Capillary Latest Range: 70-99 mg/dL 409 (H) 811 (H) 914 (H) 210 (H) 201 (H) 193 (H) 250 (H)    Inpatient Diabetes Program Recommendations Insulin - Basal: Please consider increasing Lantus to 15 units QHS.  Note: Blood glucose has ranged from 142-250 mg/dl over the past 30 hours.  Please consider increasing Lantus to 15 units QHS.  Will continue to follow.  Thanks, Orlando Penner, RN, MSN, CCRN Diabetes Coordinator Inpatient Diabetes Program [phone] (Team Pager) [phone] (AP office) [phone] [facility] office)

## 2013-08-24 NOTE — Progress Notes (Signed)
Patient continue to refuse CPAP. RT will continue to monitor.

## 2013-08-24 NOTE — Progress Notes (Signed)
Utilization review completed.  

## 2013-08-24 NOTE — Progress Notes (Signed)
PHARMACY CONSULT NOTE   Pharmacy Consult for :  Coumadin - Heparin bridging  Indication: Severe PVD  Dosing weight 66 kg  Labs:  Recent Labs  08/22/13 0402 08/23/13 0354 08/23/13 0615 08/24/13 0512 08/24/13 1336  HGB 8.8* 8.8*  --  9.6*  --   HCT 27.1* 26.7*  --  29.4*  --   PLT 239 265  --  280  --   INR 1.30 1.31  --  1.51*  --   HEPARINUNFRC 0.41 0.93* 0.64 0.23* 0.40  CREATININE 5.56* 5.87*  --  5.90*  --    Lab Results  Component Value Date   INR 1.51* 08/24/2013   INR 1.31 08/23/2013   INR 1.30 08/22/2013   Estimated Creatinine Clearance: 7.2 ml/min (by C-G formula based on Cr of 5.9).  Medications:  Scheduled:  . amLODipine  10 mg Oral Daily  . atorvastatin  40 mg Oral Daily  . calcitRIOL  0.25 mcg Oral Daily  . carvedilol  12.5 mg Oral BID WC  . cloNIDine  0.1 mg Oral BID  . clopidogrel  75 mg Oral Daily  . darbepoetin (ARANESP) injection - NON-DIALYSIS  100 mcg Subcutaneous Q Wed-1800  . diphenhydrAMINE  50 mg Oral Once  . ferrous sulfate  325 mg Oral BID  . folic acid  1 mg Oral Daily  . furosemide  160 mg Oral TID  . insulin aspart  0-5 Units Subcutaneous QHS  . insulin aspart  0-9 Units Subcutaneous TID WC  . insulin aspart  6 Units Subcutaneous TID WC  . insulin glargine  10 Units Subcutaneous QHS  . niacin  500 mg Oral QHS  . omega-3 acid ethyl esters  1 g Oral QID  . pantoprazole  40 mg Oral Daily  . polyethylene glycol  17 g Oral Daily  . senna  1 tablet Oral QHS  . sodium chloride  3 mL Intravenous Q12H  . Warfarin - Pharmacist Dosing Inpatient   Does not apply q1800   Infusions:  . heparin 900 Units/hr (08/24/13 1227)   Assessment:  72 y/o female on Coumadin with Heparin bridging for severe PVD.  Coumadin was 11/9 with plans to discharge patient with therapeutic INR.    Heparin level is therapeutic at 0.4 on IV rate of 900 units/hr.  INR trending up to 1.51.  No bleeding complications noted   Goal of Therapy:   Heparin level 0.3-0.7  units/ml  INR 2.0-3.0   Plan:  1. Coumadin 7.5mg  x 1 today. 2. Continue heparin at 900 units/hr.  Daily Heparin level, INR, CBC.    [employer], Pharm.D., BCPS Clinical Pharmacist Pager [phone] 08/24/2013 4:15 PM

## 2013-08-24 NOTE — Progress Notes (Signed)
ANTICOAGULATION CONSULT NOTE - Follow Up Consult  Pharmacy Consult for heparin Indication: PVD  Labs:  Recent Labs  08/22/13 0402 08/23/13 0354 08/23/13 0615 08/24/13 0512  HGB 8.8* 8.8*  --  9.6*  HCT 27.1* 26.7*  --  29.4*  PLT 239 265  --  280  LABPROT 15.9* 16.0*  --  17.8*  INR 1.30 1.31  --  1.51*  HEPARINUNFRC 0.41 0.93* 0.64 0.23*  CREATININE 5.56* 5.87*  --  5.90*    Assessment: 72yo female now subtherapeutic on heparin after rate decrease.  Goal of Therapy:  Heparin level 0.3-0.7 units/ml   Plan:  Will increase heparin gtt back to 900 units/hr and check level in 8hr.  Vernard Gambles, PharmD, BCPS  08/24/2013,6:40 AM

## 2013-08-25 DIAGNOSIS — Z951 Presence of aortocoronary bypass graft: Secondary | ICD-10-CM

## 2013-08-25 DIAGNOSIS — I5022 Chronic systolic (congestive) heart failure: Secondary | ICD-10-CM

## 2013-08-25 DIAGNOSIS — J449 Chronic obstructive pulmonary disease, unspecified: Secondary | ICD-10-CM

## 2013-08-25 LAB — BASIC METABOLIC PANEL WITH GFR
BUN: 61 mg/dL — ABNORMAL HIGH (ref 6–23)
CO2: 21 meq/L (ref 19–32)
Calcium: 9.1 mg/dL (ref 8.4–10.5)
Chloride: 98 meq/L (ref 96–112)
Creatinine, Ser: 6.36 mg/dL — ABNORMAL HIGH (ref 0.50–1.10)
GFR calc Af Amer: 7 mL/min — ABNORMAL LOW
GFR calc non Af Amer: 6 mL/min — ABNORMAL LOW
Glucose, Bld: 205 mg/dL — ABNORMAL HIGH (ref 70–99)
Potassium: 4 meq/L (ref 3.5–5.1)
Sodium: 137 meq/L (ref 135–145)

## 2013-08-25 LAB — CBC
HCT: 31 % — ABNORMAL LOW (ref 36.0–46.0)
Hemoglobin: 10 g/dL — ABNORMAL LOW (ref 12.0–15.0)
MCH: 27.2 pg (ref 26.0–34.0)
MCHC: 32.3 g/dL (ref 30.0–36.0)
MCV: 84.5 fL (ref 78.0–100.0)
RDW: 15.7 % — ABNORMAL HIGH (ref 11.5–15.5)
WBC: 9 10*3/uL (ref 4.0–10.5)

## 2013-08-25 LAB — GLUCOSE, CAPILLARY: Glucose-Capillary: 177 mg/dL — ABNORMAL HIGH (ref 70–99)

## 2013-08-25 LAB — HEPARIN LEVEL (UNFRACTIONATED): Heparin Unfractionated: 0.54 IU/mL (ref 0.30–0.70)

## 2013-08-25 MED ORDER — WARFARIN SODIUM 6 MG PO TABS: 6.0000 mg | ORAL_TABLET | ORAL | Status: DC

## 2013-08-25 MED ORDER — WARFARIN SODIUM 4 MG PO TABS
4.0000 mg | ORAL_TABLET | ORAL | Status: DC
  Filled 2013-08-25: qty 1

## 2013-08-25 MED ORDER — FUROSEMIDE 40 MG PO TABS: 80.0000 mg | ORAL_TABLET | Freq: Two times a day (BID) | ORAL | Status: DC

## 2013-08-25 NOTE — Progress Notes (Signed)
PHARMACY CONSULT NOTE   Pharmacy Consult for :  Coumadin - Heparin bridging  Indication: Severe PVD  Dosing weight 66 kg  Labs:  Recent Labs  08/23/13 0354  08/24/13 0512 08/24/13 1336 08/25/13 0510  HGB 8.8*  --  9.6*  --  10.0*  HCT 26.7*  --  29.4*  --  31.0*  PLT 265  --  280  --  298  INR 1.31  --  1.51*  --  2.20*  HEPARINUNFRC 0.93*  < > 0.23* 0.40 0.54  CREATININE 5.87*  --  5.90*  --  6.36*  < > = values in this interval not displayed. Lab Results  Component Value Date   INR 2.20* 08/25/2013   INR 1.51* 08/24/2013   INR 1.31 08/23/2013   Estimated Creatinine Clearance: 6.7 ml/min (by C-G formula based on Cr of 6.36).  Medications:  Scheduled:  . amLODipine  10 mg Oral Daily  . atorvastatin  40 mg Oral Daily  . calcitRIOL  0.25 mcg Oral Daily  . carvedilol  12.5 mg Oral BID WC  . cloNIDine  0.1 mg Oral BID  . clopidogrel  75 mg Oral Daily  . darbepoetin (ARANESP) injection - NON-DIALYSIS  100 mcg Subcutaneous Q Wed-1800  . diphenhydrAMINE  50 mg Oral Once  . ferrous sulfate  325 mg Oral BID  . folic acid  1 mg Oral Daily  . furosemide  160 mg Oral TID  . insulin aspart  0-5 Units Subcutaneous QHS  . insulin aspart  0-9 Units Subcutaneous TID WC  . insulin aspart  6 Units Subcutaneous TID WC  . insulin glargine  10 Units Subcutaneous QHS  . niacin  500 mg Oral QHS  . omega-3 acid ethyl esters  1 g Oral QID  . pantoprazole  40 mg Oral Daily  . polyethylene glycol  17 g Oral Daily  . senna  1 tablet Oral QHS  . sodium chloride  3 mL Intravenous Q12H  . Warfarin - Pharmacist Dosing Inpatient   Does not apply q1800   Infusions:  . heparin 900 Units/hr (08/24/13 1900)   Assessment:  72 y/o female on Coumadin with Heparin bridging for severe PVD.   Heparin level is therapeutic  on IV rate of 900 units/hr.  INR 2.20  No bleeding complications noted   Goal of Therapy:   Heparin level 0.3-0.7 units/ml  INR 2.0-3.0   Plan:  1. Resume home coumadin  dose 2. D/C heparin.  Cont daily INR.   Talbert Cage, Pharm.D Clinical Pharmacist 08/25/2013 7:11 AM

## 2013-08-25 NOTE — Progress Notes (Signed)
[facility]  In reviewing the chart, the patient continues to refuse access placement at this time.  We can arrange permanent access placement electively when the patient is ready.  Thank you.  Leonides Sake, MD [facility] Office: [phone] Pager: [phone]  08/25/2013, 8:24 AM

## 2013-08-25 NOTE — Discharge Summary (Signed)
Physician Discharge Summary  [name] UJW:[id number] DOB: 12-Feb-1941 DOA: 08/17/2013  PCP: Michiel Sites, MD  Admit date: 08/17/2013 Discharge date: 08/25/2013  Time spent: 35 minutes  Recommendations for Outpatient Follow-up:  1. Followup with primary care doctor later this week 2. Followup with Dr. Darrick Penna next week  Recommendations for primary care physician for things to follow:  Repeat BMP  Discharge Diagnoses:  Principal Problem:   Acute on chronic systolic CHF (congestive heart failure) Active Problems:   PVD (peripheral vascular disease)   S/P CABG x 5   COPD (chronic obstructive pulmonary disease)   OSA on CPAP   Essential hypertension   Renal failure, acute on chronic stage 4   Anemia  Discharge Condition: stable  Diet recommendation: Renal, low salt  Filed Weights   08/23/13 0400 08/24/13 0540 08/25/13 0502  Weight: 61.9 kg (136 lb 7.4 oz) 61.7 kg (136 lb 0.4 oz) 61.735 kg (136 lb 1.6 oz)   History of present illness:  [name] is a 72 y.o. female with history of CAD, CABG, her dyslipidemia, hypertension, IDDM, stage IV chronic kidney disease, chronic lung scarring on CT chest, ischemic cardiomyopathy with EF 44%, OSA on CPAP, COPD, on Coumadin anticoagulation-? Etiology, presents to the ED with worsening dyspnea. Patient claims compliance with medications but not compliant with salt restricted diet. She gives 3 days history of progressively worsening dyspnea on exertion, orthopnea, PND. She denies chest pain or worsening leg edema. She states that walking even a few steps brings on dyspnea. She only is able to sleep for 2 hours at night and has to wakeup secondary to dyspnea. In the ED, chest x-ray suggestive of CHF, creatinine worse >4 which was 2.8 in April. In reviewing her home med rec: Is on Lasix only when necessary and seems to be on dual ACE inhibitors. Patient has been started on IV nitroglycerin drip and hospitalist admission  requested.  Hospital Course:  Acute on stage IV chronic kidney disease creatinine In April of 2014 was 2.8. Nephrology has been consulted and followed patient while hospitalized. Vascular surgery was also consulted, since she is approaching end-stage renal disease, for vascular access in preparation for dialysis, and her Coumadin was held and she was started on heparin drip. Patient, however, refused to have vascular access placed at this time. She is not entirely sure whether she wants to undergo dialysis at all, she would like to discuss with Dr. Darrick Penna and him alone. Dr. Briant Cedar and Dr. Hyman Hopes discussed with the patient as well, however she wants to hold on getting access for now. She will call the office and try to set up an appointment next week. Her Coumadin was restarted, and the heparin drip was discontinued once her INR was greater than 2. Patient's renal function is, as expected, worsening. She understands this, but wishes no further interventions at this time. She had good urine output while hospitalized, her mental status has been intact, and she showed no uremic signs. She'll be discharged on 80 mg of Lasix twice daily, and was extensively instructed that if she gains more than 3-4 pounds in one day, she has decreased urination, or nausea or vomiting, she is to call her doctor right away. She expressed understanding. Patient was discharged home in stable condition, and home health PT has been set up upon her discharge. She was advised to followup with her primary care doctor later this week for repeat BMP and blood pressure check. Acute on chronic systolic CHF / ischemic  cardiomyopathy / CAD / CABG 2009 - stable Accelerated hypertension - patient initiated admitted on nitroglycerin drip, later controlled on oral medications.  Nitro infiltration into R forearm / hand Stable  Uncontrolled IDDM with renal complications  OSA  COPD Anemia  Chronic anticoagulation - PVD + hx DVT  PVD s/p left  femoral-popliteal bypass graft Sept 2012  Procedures:  Upper extremity vein mapping bilateral   Consultations:  Nephrology  Vascular surgery  Discharge Exam: Filed Vitals:   08/24/13 1354 08/24/13 1711 08/24/13 2033 08/25/13 0502  BP: 125/47 112/51 135/49 152/47  Pulse: 60 61 61 62  Temp: 97.9 F (36.6 C)  98 F (36.7 C) 97.3 F (36.3 C)  TempSrc: Oral  Oral Oral  Resp: 18  18 18   Height:      Weight:    61.735 kg (136 lb 1.6 oz)  SpO2: 99%  98% 95%    General: No acute distress Cardiovascular: Regular rate and rhythm, no rubs Respiratory: Clear to auscultation bilaterally  Discharge Instructions     Medication List    STOP taking these medications       digoxin 0.125 MG tablet  Commonly known as:  LANOXIN     lisinopril 40 MG tablet  Commonly known as:  PRINIVIL,ZESTRIL     ramipril 10 MG tablet  Commonly known as:  ALTACE      TAKE these medications       amLODipine 5 MG tablet  Commonly known as:  NORVASC  Take 10 mg by mouth daily.     atorvastatin 40 MG tablet  Commonly known as:  LIPITOR  Take 40 mg by mouth daily.     carvedilol 12.5 MG tablet  Commonly known as:  COREG  Take 12.5 mg by mouth 2 (two) times daily with a meal.     clopidogrel 75 MG tablet  Commonly known as:  PLAVIX  Take 75 mg by mouth daily.     ferrous sulfate 325 (65 FE) MG tablet  Take 325 mg by mouth 2 (two) times daily.     folic acid 1 MG tablet  Commonly known as:  FOLVITE  Take 1 mg by mouth daily.     furosemide 40 MG tablet  Commonly known as:  LASIX  Take 2 tablets (80 mg total) by mouth 2 (two) times daily.     insulin aspart 100 UNIT/ML injection  Commonly known as:  novoLOG  Inject 20-26 Units into the skin 3 (three) times daily before meals. Take 20 units at breakfast and lunch. Take 26 units at dinner     isosorbide mononitrate 30 MG 24 hr tablet  Commonly known as:  IMDUR  Take 0.5 tablets (15 mg total) by mouth daily.     niacin 500 MG CR  tablet  Commonly known as:  NIASPAN  Take 500 mg by mouth at bedtime.     nitroGLYCERIN 0.4 MG SL tablet  Commonly known as:  NITROSTAT  Place 0.4 mg under the tongue every 5 (five) minutes as needed for chest pain.     omega-3 acid ethyl esters 1 G capsule  Commonly known as:  LOVAZA  Take 1 g by mouth 4 (four) times daily.     omeprazole 20 MG capsule  Commonly known as:  PRILOSEC  Take 20 mg by mouth daily.     polyethylene glycol packet  Commonly known as:  MIRALAX / GLYCOLAX  Take 17 g by mouth daily.     senna 8.6  MG tablet  Commonly known as:  SENOKOT  Take 1 tablet by mouth at bedtime.     warfarin 4 MG tablet  Commonly known as:  COUMADIN  Take 4-6 mg by mouth. TAKES 1 TABLET (4 MG) MON, WEDS, FRI & SUN.  TAKES 1.5 TABLET (6 MG) TUES, THURS, SAT.           Follow-up Information   Follow up with Michiel Sites, MD. Schedule an appointment as soon as possible for a visit on 08/27/2013. (@ 9:30 am  spoke with Amy )    Specialty:  Endocrinology   Contact information:   [address] [place] [address] [phone]       Follow up with DETERDING,JAMES L, MD. Schedule an appointment as soon as possible for a visit in 1 week.   Specialty:  Nephrology   Contact information:   [address] [facility] [place] [address] [phone]       The results of significant diagnostics from this hospitalization (including imaging, microbiology, ancillary and laboratory) are listed below for reference.    Significant Diagnostic Studies: Dg Chest 2 View  08/17/2013   CLINICAL DATA:  The findings suggest congestive heart failure superimposed upon underlying COPD. No discrete focal pneumonia is demonstrated. Followup films following therapy would be useful to assure clearing.  EXAM: CHEST  2 VIEW  COMPARISON:  January 29, 2013.  FINDINGS: The lungs are mildly hyperinflated. There is stable nodularity in the right pulmonary apex.  There are new small bilateral pleural effusions greater on the right than on the left. The interstitial markings are increased diffusely which is not entirely new but it is more conspicuous than in the past. There is pleural based increased density laterally in the left mid thorax. The cardiac silhouette is top normal in size.  IMPRESSION: The findings are consistent with congestive heart failure superimposed upon COPD. Pleural based density laterally on the left is nonspecific and could reflect an area of early infiltrate. There is stable spiculated density in the right pulmonary apex likely reflecting scarring. Followup films are recommended following therapy to ensure clearing.   Electronically Signed   By: David  [place]   On: 08/17/2013 16:52    Microbiology: Recent Results (from the past 240 hour(s))  MRSA PCR SCREENING     Status: None   Collection Time    08/17/13  7:05 PM      Result Value Range Status   MRSA by PCR NEGATIVE  NEGATIVE Final   Comment:            The GeneXpert MRSA Assay (FDA     approved for NASAL specimens     only), is one component of a     comprehensive MRSA colonization     surveillance program. It is not     intended to diagnose MRSA     infection nor to guide or     monitor treatment for     MRSA infections.     Labs: Basic Metabolic Panel:  Recent Labs Lab 08/20/13 0500 08/21/13 0441 08/22/13 0402 08/23/13 0354 08/24/13 0512 08/25/13 0510  NA 139 140 139 137 136 137  K 3.9 4.1 3.8 3.7 4.4 4.0  CL 104 104 102 99 97 98  CO2 22 21 22 24 22 21   GLUCOSE 99 122* 179* 146* 183* 205*  BUN 56* 56* 53* 55* 58* 61*  CREATININE 5.16* 5.38* 5.56* 5.87* 5.90* 6.36*  CALCIUM 8.6 8.7 8.6 8.9  8.8 9.1  PHOS 4.8* 4.6 4.1 4.2 4.5  --    Liver Function Tests:  Recent Labs Lab 08/20/13 0500 08/21/13 0441 08/22/13 0402 08/23/13 0354 08/24/13 0512  ALBUMIN 2.1* 2.1* 2.1* 2.3* 2.3*   CBC:  Recent Labs Lab 08/21/13 0441 08/22/13 0402 08/23/13 0354  08/24/13 0512 08/25/13 0510  WBC 8.4 8.8 9.3 10.9* 9.0  HGB 8.7* 8.8* 8.8* 9.6* 10.0*  HCT 26.4* 27.1* 26.7* 29.4* 31.0*  MCV 82.0 83.6 83.7 84.2 84.5  PLT 239 239 265 280 298   BNP: BNP (last 3 results)  Recent Labs  08/17/13 1603  PROBNP 28521.0*   CBG:  Recent Labs Lab 08/24/13 0636 08/24/13 1129 08/24/13 1619 08/24/13 2144 08/25/13 0608  GLUCAP 193* 250* 197* 287* 177*    Signed:  GHERGHE, COSTIN  [facility] 08/25/2013, 4:50 PM

## 2013-08-25 NOTE — Progress Notes (Signed)
Physical Therapy Treatment Patient Details Name: [name] MRN: [id number] DOB: 1941/02/17 Today's Date: 08/25/2013 Time: 5621-3086 PT Time Calculation (min): 18 min  PT Assessment / Plan / Recommendation  History of Present Illness 72 y.o. female admitted to [facility] on 08/17/13 with history of CAD, CABG, her dyslipidemia, hypertension, IDDM, stage IV chronic kidney disease, chronic lung scarring on CT chest, ischemic cardiomyopathy with EF 44%, OSA on CPAP, COPD, on Coumadin anticoagulation-? Etiology, presenting this admission with dyspnea/SOB.     PT Comments   Pt admitted with above. Pt currently with functional limitations due to the deficits listed below (see PT Problem List). Pt scored 46/56 on Berg suggesting 50% risk of falls.  Recommended 4 wheeled RW to pt and family and they agree.  Contacted CM who is ordering equipment as well as HHPT f/u.  Pt to go home after she gets her equipment.  Pt will benefit from skilled PT to increase their independence and safety with mobility to allow discharge to the venue listed below.   Follow Up Recommendations  Home health PT;Supervision - Intermittent                 Equipment Recommendations  Other (comment) (4 wheeled RW)        Frequency Min 3X/week   Progress towards PT Goals Progress towards PT goals: Progressing toward goals  Plan Discharge plan needs to be updated;Equipment recommendations need to be updated    Precautions / Restrictions Precautions Precautions: Fall Restrictions Weight Bearing Restrictions: No   Pertinent Vitals/Pain VSS, No pain    Mobility  Bed Mobility Bed Mobility: Supine to Sit Supine to Sit: 7: Independent Transfers Transfers: Sit to Stand;Stand to Sit Sit to Stand: With upper extremity assist;From bed;7: Independent Stand to Sit: With upper extremity assist;To bed;7: Independent Ambulation/Gait Ambulation/Gait Assistance: 5: Supervision Ambulation Distance (Feet): 450 Feet Assistive device:  None Ambulation/Gait Assistance Details: Pt with occasional LOB with challenges.  See Sharlene Motts for balance score.  Pt will need device on d/c for ultimate safety.  Pt and daughter aware.   Gait Pattern: Step-through pattern;Decreased stride length Gait velocity: decreased Stairs: No Wheelchair Mobility Wheelchair Mobility: No     PT Goals (current goals can now be found in the care plan section)    Visit Information  Last PT Received On: 08/25/13 Assistance Needed: +1 History of Present Illness: 71 y.o. female admitted to [facility] on 08/17/13 with history of CAD, CABG, her dyslipidemia, hypertension, IDDM, stage IV chronic kidney disease, chronic lung scarring on CT chest, ischemic cardiomyopathy with EF 44%, OSA on CPAP, COPD, on Coumadin anticoagulation-? Etiology, presenting this admission with dyspnea/SOB.      Subjective Data  Subjective: "I feel ready to go home."   Cognition  Cognition Arousal/Alertness: Awake/alert Behavior During Therapy: WFL for tasks assessed/performed Overall Cognitive Status: Within Functional Limits for tasks assessed    Balance  Standardized Balance Assessment Standardized Balance Assessment: Berg Balance Test Berg Balance Test Sit to Stand: Able to stand  independently using hands Standing Unsupported: Able to stand safely 2 minutes Sitting with Back Unsupported but Feet Supported on Floor or Stool: Able to sit safely and securely 2 minutes Stand to Sit: Controls descent by using hands Transfers: Able to transfer safely, minor use of hands Standing Unsupported with Eyes Closed: Able to stand 10 seconds safely Standing Ubsupported with Feet Together: Able to place feet together independently and stand 1 minute safely From Standing, Reach Forward with Outstretched Arm: Can reach forward >12  cm safely (5") From Standing Position, Pick up Object from Floor: Able to pick up shoe safely and easily From Standing Position, Turn to Look Behind Over each Shoulder:  Looks behind one side only/other side shows less weight shift Turn 360 Degrees: Able to turn 360 degrees safely in 4 seconds or less Standing Unsupported, Alternately Place Feet on Step/Stool: Able to complete 4 steps without aid or supervision Standing Unsupported, One Foot in Front: Able to take small step independently and hold 30 seconds Standing on One Leg: Able to lift leg independently and hold equal to or more than 3 seconds Total Score: 46  End of Session PT - End of Session Equipment Utilized During Treatment: Gait belt Activity Tolerance: Patient tolerated treatment well Patient left: in bed;with call bell/phone within reach;with family/visitor present Nurse Communication: Mobility status       INGOLD,Immaculate Crutcher 08/25/2013, 10:09 AM [facility] [phone] (pager)

## 2013-09-02 ENCOUNTER — Other Ambulatory Visit: Payer: Self-pay | Admitting: *Deleted

## 2013-09-02 ENCOUNTER — Encounter (HOSPITAL_COMMUNITY): Payer: Self-pay | Admitting: Pharmacy Technician

## 2013-09-03 ENCOUNTER — Ambulatory Visit (INDEPENDENT_AMBULATORY_CARE_PROVIDER_SITE_OTHER): Payer: Medicare Other | Admitting: Internal Medicine

## 2013-09-03 ENCOUNTER — Encounter: Payer: Self-pay | Admitting: Internal Medicine

## 2013-09-03 VITALS — BP 150/70 | HR 67 | Ht 63.0 in | Wt 138.6 lb

## 2013-09-03 DIAGNOSIS — J449 Chronic obstructive pulmonary disease, unspecified: Secondary | ICD-10-CM

## 2013-09-03 DIAGNOSIS — R9439 Abnormal result of other cardiovascular function study: Secondary | ICD-10-CM

## 2013-09-03 DIAGNOSIS — N184 Chronic kidney disease, stage 4 (severe): Secondary | ICD-10-CM

## 2013-09-03 DIAGNOSIS — I2510 Atherosclerotic heart disease of native coronary artery without angina pectoris: Secondary | ICD-10-CM

## 2013-09-03 DIAGNOSIS — I739 Peripheral vascular disease, unspecified: Secondary | ICD-10-CM

## 2013-09-03 DIAGNOSIS — Z951 Presence of aortocoronary bypass graft: Secondary | ICD-10-CM

## 2013-09-03 DIAGNOSIS — I5022 Chronic systolic (congestive) heart failure: Secondary | ICD-10-CM

## 2013-09-03 DIAGNOSIS — I10 Essential (primary) hypertension: Secondary | ICD-10-CM

## 2013-09-03 NOTE — Patient Instructions (Signed)
Your physician wants you to follow-up in:  6 months. You will receive a reminder letter in the mail two months in advance. If you don't receive a letter, please call our office to schedule the follow-up appointment.   

## 2013-09-03 NOTE — Progress Notes (Signed)
OFFICE NOTE  Chief Complaint:  Dyspnea on exertion, return office visit  Primary Care Physician: Michiel Sites, MD  HPI:  [name] is a 72 year old female with a history of CABG in 2009 x 5 vessls, dyslipidemia, hypertension, and diabetes. Last summer, she had been having some increasing shortness of breath and underwent a MET-TEST, which was a poor effort, but her VO2 was markedly reduced at 29%. It is difficult, therefore, to interpret the test; however, the reduced VO2 is concerning. There was a slight reduction in diffusion capacity in her PFTs and some mild obstruction, and I think COPD probably plays a role in things. Recently, though, she has been more short of breath with elevated heart rate. She has had pressure in her chest and difficulty when doing activities. She underwent a stress test in our office to further characterize this, which was a [place] nuclear stress test, performed on January 13, 2013. This was interpreted as a high-risk study. There were scattered PVCs during the study. EF was 44% with inferior akinesis and anteroapical hypokinesis. There was a reversible defect in the mid and distal anterior wall and apex consistent with ischemia, and a fixed defect inferiorly consistent with scar. Based on these findings, cardiac catheterization was recommended. Standard preoperative testing however revealed anemia, and marked chronic kidney disease which was unknown. She also had an abnormality on her chest x-ray concerning for possible pneumonia or mass. Catheterization was then canceled and prior to seeing her back in the office, she presented to the emergency department with an episode of epistaxis which is easily controlled. She subsequently underwent a CT scan of the chest which reveals chronic scarring and perhaps a reactive pleural-based nodule. She is multivessel disease which is known and is status post CABG.  assess suspected that some of her shortness of breath was  deemed to worsening renal failure. I referred to see Dr. Eliott Nine at [facility] in May he started to work toward possible dialysis. She was recently seen and admitted to [facility] hospital for worsening shortness of breath and pulmonary edema mostly due to progressive renal dysfunction. She was diureses, placed on a nitroglycerin drip and her medications were adjusted but she did not have dialysis. She was evaluated for fistula placement but wanted to wait to have that done as an outpatient. She reports that she is going to have that placed next week. She denied any chest pain during this episode.  PMHx:  Past Medical History  Diagnosis Date  . CAD (coronary artery disease)   . Hypertension   . Diabetes mellitus     type 2 insulin dependent  . Hyperlipidemia   . CHF (congestive heart failure)   . Cellulitis     left foot  . Ulcer     diabetic ulcer on left foot  . DVT (deep venous thrombosis)     peroneal vein  . Peripheral vascular disease   . Chronic kidney disease   . Anemia     Past Surgical History  Procedure Laterality Date  . Tubal ligation    . Pr vein bypass graft,aorto-fem-pop  06-26-11    1. PATENT LEFT FEMORAL-POPLITEAL BYPASS GRAFT W/NO EVEIDENCE OF STENOSIS. 2.VELOCITIES OF GREATER THAN 200 CM/'s NOTED ON PREVIOUS EXAM 10/03/11 WERE NOT ADEQUATELY VISULAIZED DURING THIS EXAM  . Coronary artery bypass graft  04/25/08    Dr. Tyrone Sage, CABG x 5   . Nm myoview ltd  01/13/13    LEXISCAN; LV WALL MOTION: LVEF 44%, INFERIOR  AKINESIS, ANTEROAPICAL HYPOKINESIS  . Cardiac catheterization  04/22/08    SEVERE 3-VESSEL DISEASE CAD., CABG X 5 04/25/08 WITH DR. Tyrone Sage    FAMHx:  Family History  Problem Relation Age of Onset  . Cancer Mother     Kidney  . Kidney disease Mother   . Cancer Father     skin    SOCHx:   reports that she quit smoking about 17 years ago. Her smoking use included Cigarettes. She has a 40 pack-year smoking history. She has quit using smokeless  tobacco. Her smokeless tobacco use included Snuff. She reports that she does not drink alcohol or use illicit drugs.  ALLERGIES:  Allergies  Allergen Reactions  . Ivp Dye [Iodinated Diagnostic Agents]   . Aspirin Hives    ROS: A comprehensive review of systems was negative except for: Constitutional: positive for fatigue Respiratory: positive for dyspnea on exertion, wheezing and OSA Cardiovascular: positive for dyspnea Hematologic/lymphatic: positive for anemia  HOME MEDS: Current Outpatient Prescriptions  Medication Sig Dispense Refill  . amLODipine (NORVASC) 5 MG tablet Take 10 mg by mouth daily.       [name] atorvastatin (LIPITOR) 40 MG tablet Take 40 mg by mouth daily.        . carvedilol (COREG) 12.5 MG tablet Take 12.5 mg by mouth 2 (two) times daily with a meal.        . clopidogrel (PLAVIX) 75 MG tablet Take 75 mg by mouth daily.        . ferrous sulfate 325 (65 FE) MG tablet Take 325 mg by mouth 2 (two) times daily.        . folic acid (FOLVITE) 1 MG tablet Take 1 mg by mouth daily.        . furosemide (LASIX) 40 MG tablet Take 2 tablets (80 mg total) by mouth 2 (two) times daily.  120 tablet  1  . insulin aspart (NOVOLOG) 100 UNIT/ML injection Inject 20-26 Units into the skin 3 (three) times daily before meals. Take 20 units at breakfast and lunch. Take 26 units at dinner Sliding scale      . isosorbide mononitrate (IMDUR) 30 MG 24 hr tablet Take 0.5 tablets (15 mg total) by mouth daily.  90 tablet  3  . niacin (NIASPAN) 500 MG CR tablet Take 500 mg by mouth at bedtime.        . nitroGLYCERIN (NITROSTAT) 0.4 MG SL tablet Place 0.4 mg under the tongue every 5 (five) minutes as needed for chest pain.      [name] omega-3 acid ethyl esters (LOVAZA) 1 G capsule Take 1 g by mouth 4 (four) times daily.       [name] omeprazole (PRILOSEC) 20 MG capsule Take 20 mg by mouth daily.        . polyethylene glycol (MIRALAX / GLYCOLAX) packet Take 17 g by mouth daily.        [name] senna (SENOKOT) 8.6 MG  tablet Take 1 tablet by mouth at bedtime.      [name] warfarin (COUMADIN) 4 MG tablet Take 4-6 mg by mouth. TAKES 1 TABLET (4 MG) MON, WEDS, FRI & SUN.  TAKES 1.5 TABLET (6 MG) TUES, THURS, SAT.       No current facility-administered medications for this visit.    LABS/IMAGING: No results found for this or any previous visit (from the past 48 hour(s)). No results found.  VITALS: BP 150/70  Pulse 67  Ht 5\' 3"  (1.6 m)  Wt 138 lb 9.6  oz (62.869 kg)  BMI 24.56 kg/m2  EXAM: General appearance: alert and no distress Neck: no adenopathy, no carotid bruit, no JVD, supple, symmetrical, trachea midline and thyroid not enlarged, symmetric, no tenderness/mass/nodules Lungs: wheezes Faint expiratory bilateral Heart: regular rate and rhythm, S1, S2 normal, no murmur, click, rub or gallop Abdomen: Obese, soft nontender Extremities: extremities normal, atraumatic, no cyanosis or edema Pulses: 2+ and symmetric Skin: Skin color, texture, turgor normal. No rashes or lesions Neurologic: Grossly normal  EKG: Sinus rhythm at 67 with a inferior infarct and clear Q waves in 2, 3 and aVF as well as a right bundle-branch pattern  ASSESSMENT: 1. CABG x5 vessels, with probable angina which will be medically managed 2. Ischemic cardiomyopathy, EF 44% with inferior scar and peri-infarct ischemia., [place] heart association class II symptoms. - recent exacerbation 3. CKD 5 - awaiting placement of dialysis fistula 4.   Hypertension  5.   Dyslipidemia 6.   Obstructive sleep apnea on CPAP 7.   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   Mrs. Deiter has heart failure with an inferior MI and probably occluded or near occluded graft. She was having symptoms concerning for angina, but her chest pain has improved. She is breathing much better after treatment for possible heart failure versus worsening chronic kidney disease. I agree with placement of a fistula as she will likely need to start dialysis in  the near future. I reviewed her laboratory work from Dr. Marcene Brawn office which indicated a creatinine now 7.1, BUN of 69, albumin 2.5 and mildly elevated AST and ALT of 54 and 98.  Clearly she will be in need of dialysis in the very near future. For now would recommend continuing her current medical regimen and if she has recurrent chest pain would consider cardiac catheterization based on her abnormal stress test in April.  Followup in 6 months or sooner as necessary.  Chrystie Nose, MD, [facility] Attending Cardiologist [facility]  Saleen Peden C 09/03/2013, 10:36 AM

## 2013-09-06 ENCOUNTER — Encounter (HOSPITAL_COMMUNITY): Payer: Self-pay | Admitting: *Deleted

## 2013-09-06 MED ORDER — SODIUM CHLORIDE 0.9 % IV SOLN: INTRAVENOUS | Status: DC

## 2013-09-06 MED ORDER — DEXTROSE 5 % IV SOLN
1.5000 g | INTRAVENOUS | Status: AC
  Administered 2013-09-07: 1.5 g via INTRAVENOUS
  Filled 2013-09-06: qty 1.5

## 2013-09-07 ENCOUNTER — Encounter (HOSPITAL_COMMUNITY)
Admission: RE | Disposition: A | Discharge status: Home / Self Care | Payer: Self-pay | Source: Ambulatory Visit | Attending: Vascular Surgery

## 2013-09-07 ENCOUNTER — Encounter (HOSPITAL_COMMUNITY): Payer: Self-pay | Admitting: Anesthesiology

## 2013-09-07 ENCOUNTER — Encounter (HOSPITAL_COMMUNITY): Payer: Medicare Other | Admitting: Anesthesiology

## 2013-09-07 ENCOUNTER — Ambulatory Visit (HOSPITAL_COMMUNITY): Payer: Medicare Other | Admitting: Anesthesiology

## 2013-09-07 ENCOUNTER — Ambulatory Visit (HOSPITAL_COMMUNITY)
Admission: RE | Admit: 2013-09-07 | Discharge: 2013-09-07 | Disposition: A | Discharge status: Home / Self Care | Payer: Medicare Other | Source: Ambulatory Visit | Attending: Vascular Surgery | Admitting: Vascular Surgery

## 2013-09-07 DIAGNOSIS — J4489 Other specified chronic obstructive pulmonary disease: Secondary | ICD-10-CM | POA: Insufficient documentation

## 2013-09-07 DIAGNOSIS — N186 End stage renal disease: Secondary | ICD-10-CM

## 2013-09-07 DIAGNOSIS — Z951 Presence of aortocoronary bypass graft: Secondary | ICD-10-CM | POA: Insufficient documentation

## 2013-09-07 DIAGNOSIS — I120 Hypertensive chronic kidney disease with stage 5 chronic kidney disease or end stage renal disease: Secondary | ICD-10-CM | POA: Insufficient documentation

## 2013-09-07 DIAGNOSIS — J449 Chronic obstructive pulmonary disease, unspecified: Secondary | ICD-10-CM | POA: Insufficient documentation

## 2013-09-07 DIAGNOSIS — Z87891 Personal history of nicotine dependence: Secondary | ICD-10-CM | POA: Insufficient documentation

## 2013-09-07 DIAGNOSIS — I509 Heart failure, unspecified: Secondary | ICD-10-CM | POA: Insufficient documentation

## 2013-09-07 DIAGNOSIS — E119 Type 2 diabetes mellitus without complications: Secondary | ICD-10-CM | POA: Insufficient documentation

## 2013-09-07 DIAGNOSIS — N184 Chronic kidney disease, stage 4 (severe): Secondary | ICD-10-CM

## 2013-09-07 HISTORY — DX: Insomnia, unspecified: G47.00

## 2013-09-07 HISTORY — PX: AV FISTULA PLACEMENT: SHX1204

## 2013-09-07 LAB — POCT I-STAT 4, (NA,K, GLUC, HGB,HCT)
Glucose, Bld: 271 mg/dL — ABNORMAL HIGH (ref 70–99)
HCT: 31 % — ABNORMAL LOW (ref 36.0–46.0)
Potassium: 4.9 mEq/L (ref 3.5–5.1)
Sodium: 141 mEq/L (ref 135–145)

## 2013-09-07 LAB — GLUCOSE, CAPILLARY
Glucose-Capillary: 246 mg/dL — ABNORMAL HIGH (ref 70–99)
Glucose-Capillary: 224 mg/dL — ABNORMAL HIGH (ref 70–99)

## 2013-09-07 LAB — PROTIME-INR: INR: 1.1 (ref 0.00–1.49)

## 2013-09-07 SURGERY — ARTERIOVENOUS (AV) FISTULA CREATION
Anesthesia: Monitor Anesthesia Care | Site: Arm Lower | Laterality: Left | Wound class: Clean

## 2013-09-07 MED ORDER — LIDOCAINE HCL (PF) 1 % IJ SOLN
INTRAMUSCULAR | Status: AC
  Filled 2013-09-07: qty 30

## 2013-09-07 MED ORDER — HEPARIN SODIUM (PORCINE) 1000 UNIT/ML IJ SOLN
INTRAMUSCULAR | Status: DC | PRN
  Administered 2013-09-07: 5000 [IU] via INTRAVENOUS

## 2013-09-07 MED ORDER — PROPOFOL INFUSION 10 MG/ML OPTIME
INTRAVENOUS | Status: DC | PRN
  Administered 2013-09-07: 25 ug/kg/min via INTRAVENOUS

## 2013-09-07 MED ORDER — OXYCODONE-ACETAMINOPHEN 5-325 MG PO TABS: 1.0000 | ORAL_TABLET | Freq: Four times a day (QID) | ORAL | Status: DC | PRN

## 2013-09-07 MED ORDER — FENTANYL CITRATE 0.05 MG/ML IJ SOLN
INTRAMUSCULAR | Status: DC | PRN
  Administered 2013-09-07 (×2): 25 ug via INTRAVENOUS
  Administered 2013-09-07: 50 ug via INTRAVENOUS

## 2013-09-07 MED ORDER — LIDOCAINE HCL (PF) 1 % IJ SOLN
INTRAMUSCULAR | Status: DC | PRN
  Administered 2013-09-07: 30 mL

## 2013-09-07 MED ORDER — PROPOFOL 10 MG/ML IV BOLUS
INTRAVENOUS | Status: DC | PRN
  Administered 2013-09-07: 10 mg via INTRAVENOUS

## 2013-09-07 MED ORDER — SODIUM CHLORIDE 0.9 % IV SOLN
INTRAVENOUS | Status: DC | PRN
  Administered 2013-09-07: 07:00:00 via INTRAVENOUS

## 2013-09-07 MED ORDER — 0.9 % SODIUM CHLORIDE (POUR BTL) OPTIME
TOPICAL | Status: DC | PRN
  Administered 2013-09-07: 1000 mL

## 2013-09-07 MED ORDER — THROMBIN 20000 UNITS EX SOLR
CUTANEOUS | Status: AC
  Filled 2013-09-07: qty 20000

## 2013-09-07 MED ORDER — SODIUM CHLORIDE 0.9 % IR SOLN
Status: DC | PRN
  Administered 2013-09-07: 07:00:00

## 2013-09-07 SURGICAL SUPPLY — 47 items
ADH SKN CLS APL DERMABOND .7 (GAUZE/BANDAGES/DRESSINGS) ×1
ADH SKN CLS LQ APL DERMABOND (GAUZE/BANDAGES/DRESSINGS) ×1
ARMBAND PINK RESTRICT EXTREMIT (MISCELLANEOUS) ×2 IMPLANT
CANISTER SUCTION 2500CC (MISCELLANEOUS) ×2 IMPLANT
CLIP TI MEDIUM 6 (CLIP) ×2 IMPLANT
CLIP TI WIDE RED SMALL 6 (CLIP) ×2 IMPLANT
COVER PROBE W GEL 5X96 (DRAPES) ×2 IMPLANT
COVER SURGICAL LIGHT HANDLE (MISCELLANEOUS) ×2 IMPLANT
DECANTER SPIKE VIAL GLASS SM (MISCELLANEOUS) ×2 IMPLANT
DERMABOND ADHESIVE PROPEN (GAUZE/BANDAGES/DRESSINGS) ×1
DERMABOND ADVANCED (GAUZE/BANDAGES/DRESSINGS) ×1
DERMABOND ADVANCED .7 DNX12 (GAUZE/BANDAGES/DRESSINGS) ×1 IMPLANT
DERMABOND ADVANCED .7 DNX6 (GAUZE/BANDAGES/DRESSINGS) IMPLANT
DRAIN PENROSE 1/4X12 LTX STRL (WOUND CARE) ×2 IMPLANT
ELECT REM PT RETURN 9FT ADLT (ELECTROSURGICAL) ×2
ELECTRODE REM PT RTRN 9FT ADLT (ELECTROSURGICAL) ×1 IMPLANT
GEL ULTRASOUND 20GR AQUASONIC (MISCELLANEOUS) IMPLANT
GLOVE BIO SURGEON STRL SZ7.5 (GLOVE) ×2 IMPLANT
GLOVE BIOGEL PI IND STRL 6.5 (GLOVE) IMPLANT
GLOVE BIOGEL PI IND STRL 7.0 (GLOVE) IMPLANT
GLOVE BIOGEL PI IND STRL 7.5 (GLOVE) IMPLANT
GLOVE BIOGEL PI INDICATOR 6.5 (GLOVE) ×2
GLOVE BIOGEL PI INDICATOR 7.0 (GLOVE) ×1
GLOVE BIOGEL PI INDICATOR 7.5 (GLOVE) ×1
GLOVE ECLIPSE 6.5 STRL STRAW (GLOVE) ×1 IMPLANT
GLOVE SS BIOGEL STRL SZ 7 (GLOVE) IMPLANT
GLOVE SUPERSENSE BIOGEL SZ 7 (GLOVE) ×1
GOWN PREVENTION PLUS XLARGE (GOWN DISPOSABLE) ×2 IMPLANT
GOWN STRL NON-REIN LRG LVL3 (GOWN DISPOSABLE) ×6 IMPLANT
KIT BASIN OR (CUSTOM PROCEDURE TRAY) ×2 IMPLANT
KIT ROOM TURNOVER OR (KITS) ×2 IMPLANT
LOOP VESSEL MINI RED (MISCELLANEOUS) ×1 IMPLANT
NDL HYPO 25GX1X1/2 BEV (NEEDLE) ×1 IMPLANT
NEEDLE HYPO 25GX1X1/2 BEV (NEEDLE) ×2 IMPLANT
NS IRRIG 1000ML POUR BTL (IV SOLUTION) ×2 IMPLANT
PACK CV ACCESS (CUSTOM PROCEDURE TRAY) ×2 IMPLANT
PAD ARMBOARD 7.5X6 YLW CONV (MISCELLANEOUS) ×4 IMPLANT
SPONGE SURGIFOAM ABS GEL 100 (HEMOSTASIS) IMPLANT
SUT PROLENE 6 0 CC (SUTURE) ×1 IMPLANT
SUT PROLENE 7 0 BV 1 (SUTURE) ×2 IMPLANT
SUT VIC AB 3-0 SH 27 (SUTURE) ×2
SUT VIC AB 3-0 SH 27X BRD (SUTURE) ×1 IMPLANT
SUT VICRYL 4-0 PS2 18IN ABS (SUTURE) ×2 IMPLANT
TOWEL OR 17X24 6PK STRL BLUE (TOWEL DISPOSABLE) ×2 IMPLANT
TOWEL OR 17X26 10 PK STRL BLUE (TOWEL DISPOSABLE) ×2 IMPLANT
UNDERPAD 30X30 INCONTINENT (UNDERPADS AND DIAPERS) ×2 IMPLANT
WATER STERILE IRR 1000ML POUR (IV SOLUTION) ×2 IMPLANT

## 2013-09-07 NOTE — Transfer of Care (Signed)
Immediate Anesthesia Transfer of Care Note  Patient: [name]  Procedure(s) Performed: Procedure(s): ARTERIOVENOUS (AV) FISTULA CREATION (Left)  Patient Location: PACU  Anesthesia Type:MAC  Level of Consciousness: awake, alert , oriented and patient cooperative  Airway & Oxygen Therapy: Patient Spontanous Breathing  Post-op Assessment: Report given to PACU RN, Post -op Vital signs reviewed and stable and Patient moving all extremities  Post vital signs: Reviewed and stable  Complications: No apparent anesthesia complications

## 2013-09-07 NOTE — Interval H&P Note (Signed)
History and Physical Interval Note:  09/07/2013 7:32 AM  [name]  has presented today for surgery, with the diagnosis of ESRD  The various methods of treatment have been discussed with the patient and family. After consideration of risks, benefits and other options for treatment, the patient has consented to  Procedure(s): ARTERIOVENOUS (AV) FISTULA CREATION (Left) as a surgical intervention .  The patient's history has been reviewed, patient examined, no change in status, stable for surgery.  I have reviewed the patient's chart and labs.  Questions were answered to the patient's satisfaction.     FIELDS,CHARLES E

## 2013-09-07 NOTE — Anesthesia Postprocedure Evaluation (Signed)
  Anesthesia Post-op Note  Patient: [name]  Procedure(s) Performed: Procedure(s): ARTERIOVENOUS (AV) FISTULA CREATION (Left)  Patient Location: PACU  Anesthesia Type:MAC  Level of Consciousness: awake  Airway and Oxygen Therapy: Patient Spontanous Breathing  Post-op Pain: mild  Post-op Assessment: Post-op Vital signs reviewed  Post-op Vital Signs: Reviewed  Complications: No apparent anesthesia complications

## 2013-09-07 NOTE — Anesthesia Preprocedure Evaluation (Addendum)
Anesthesia Evaluation  Patient identified by MRN, date of birth, ID band Patient awake    Reviewed: Allergy & Precautions, H&P , NPO status , Patient's Chart, lab work & pertinent test results  History of Anesthesia Complications Negative for: history of anesthetic complications  Airway Mallampati: II      Dental   Pulmonary shortness of breath, sleep apnea , COPDformer smoker,  Quit smoking 10-15 years ago breath sounds clear to auscultation  Pulmonary exam normal       Cardiovascular hypertension, + CAD, + Peripheral Vascular Disease and +CHF Rhythm:Regular Rate:Normal  CABG 2009,  Recent admission for CHF   Neuro/Psych    GI/Hepatic negative GI ROS, Neg liver ROS,   Endo/Other  diabetes, Well Controlled, Type 1CBG 243  Renal/GU CRFRenal disease     Musculoskeletal   Abdominal Normal abdominal exam  (+)   Peds  Hematology   Anesthesia Other Findings   Reproductive/Obstetrics                        Anesthesia Physical Anesthesia Plan  ASA: III  Anesthesia Plan: MAC   Post-op Pain Management:    Induction: Intravenous  Airway Management Planned:   Additional Equipment:   Intra-op Plan:   Post-operative Plan:   Informed Consent: I have reviewed the patients History and Physical, chart, labs and discussed the procedure including the risks, benefits and alternatives for the proposed anesthesia with the patient or authorized representative who has indicated his/her understanding and acceptance.   Dental advisory given  Plan Discussed with: CRNA, Anesthesiologist and Surgeon  Anesthesia Plan Comments:        Anesthesia Quick Evaluation

## 2013-09-07 NOTE — Op Note (Signed)
Procedure: Left Brachial Cephalic AV fistula  Preop: ESRD  Postop: ESRD  Anesthesia: Local with MAC  Assistant:  Lianne Cure, PA-C  Findings: 3.5 mm cephalic vein  Procedure: After obtaining informed consent, the patient was taken to the operating room.  After induction of general anesthesia, the left upper extremity was prepped and draped in usual sterile fashion.  A transverse incision was then made near the antecubital crease the left arm. The incision was carried into the subcutaneous tissues down to level of the cephalic vein. The cephalic vein was approximately 3.5 mm in diameter. It was of good quality. This was dissected free circumferentially and small side branches ligated and divided between silk ties or clips. Next the brachial artery was dissected free in the medial portion of the incision. The artery was  3-4 mm in diameter. The vessel loops were placed proximal and distal to the planned site of arteriotomy. The patient was given 5000 units of intravenous heparin. After appropriate circulation time, the vessel loops were used to control the artery. A longitudinal opening was made in the brachial artery.  The vein was ligated distally with a 2-0 silk tie. The vein was controlled proximally with a fine bulldog clamp. The vein was then swung over to the artery and sewn end of vein to side of artery using a running 7-0 Prolene suture. Just prior to completion of the anastomosis, everything was fore bled back bled and thoroughly flushed. The anastomosis was secured, vessel loops released, and there was a palpable thrill in the fistula immediately. After hemostasis was obtained, the subcutaneous tissues were reapproximated using a running 3-0 Vicryl suture. The skin was then closed with a 4 Vicryl subcuticular stitch. Dermabond was applied to the skin incision.  The patient had a palpable radial pulse at the end of the case.  Fabienne Bruns, MD [facility]  [place] Office: [phone] Pager: [phone]

## 2013-09-07 NOTE — Addendum Note (Signed)
Addendum created 09/07/13 1112 by Coralee Rud, CRNA   Modules edited: Anesthesia Medication Administration

## 2013-09-07 NOTE — H&P (View-Only) (Signed)
[facility] [place]  Pt still undecided on proceeding with access.  Let us know if patient makes a decision to proceed and we will schedule L RC vs BC AVF.  I will be out until 12 NOV 14, so Dr. Brabham will be covering for me.  Brian Chen, MD [facility] [place] Office: [phone] Pager: [phone]  08/20/2013, 4:39 PM     

## 2013-09-08 ENCOUNTER — Encounter (HOSPITAL_COMMUNITY): Payer: Self-pay | Admitting: Vascular Surgery

## 2013-09-30 ENCOUNTER — Telehealth: Payer: Self-pay | Admitting: *Deleted

## 2013-09-30 NOTE — Telephone Encounter (Signed)
Faxed CPAP supply order back to [facility].

## 2013-10-04 ENCOUNTER — Encounter (HOSPITAL_COMMUNITY): Payer: Self-pay | Admitting: Emergency Medicine

## 2013-10-04 ENCOUNTER — Emergency Department (HOSPITAL_COMMUNITY)
Admission: EM | Admit: 2013-10-04 | Discharge: 2013-10-04 | Disposition: A | Discharge status: Home / Self Care | Payer: Medicare Other | Attending: Emergency Medicine | Admitting: Emergency Medicine

## 2013-10-04 DIAGNOSIS — I509 Heart failure, unspecified: Secondary | ICD-10-CM | POA: Insufficient documentation

## 2013-10-04 DIAGNOSIS — IMO0002 Reserved for concepts with insufficient information to code with codable children: Secondary | ICD-10-CM

## 2013-10-04 DIAGNOSIS — L97509 Non-pressure chronic ulcer of other part of unspecified foot with unspecified severity: Secondary | ICD-10-CM | POA: Insufficient documentation

## 2013-10-04 DIAGNOSIS — E1169 Type 2 diabetes mellitus with other specified complication: Secondary | ICD-10-CM | POA: Insufficient documentation

## 2013-10-04 DIAGNOSIS — S91109A Unspecified open wound of unspecified toe(s) without damage to nail, initial encounter: Secondary | ICD-10-CM | POA: Insufficient documentation

## 2013-10-04 DIAGNOSIS — Z7901 Long term (current) use of anticoagulants: Secondary | ICD-10-CM | POA: Insufficient documentation

## 2013-10-04 DIAGNOSIS — D649 Anemia, unspecified: Secondary | ICD-10-CM | POA: Insufficient documentation

## 2013-10-04 DIAGNOSIS — Z86718 Personal history of other venous thrombosis and embolism: Secondary | ICD-10-CM | POA: Insufficient documentation

## 2013-10-04 DIAGNOSIS — I120 Hypertensive chronic kidney disease with stage 5 chronic kidney disease or end stage renal disease: Secondary | ICD-10-CM | POA: Insufficient documentation

## 2013-10-04 DIAGNOSIS — Z951 Presence of aortocoronary bypass graft: Secondary | ICD-10-CM | POA: Insufficient documentation

## 2013-10-04 DIAGNOSIS — I2510 Atherosclerotic heart disease of native coronary artery without angina pectoris: Secondary | ICD-10-CM | POA: Insufficient documentation

## 2013-10-04 DIAGNOSIS — I739 Peripheral vascular disease, unspecified: Secondary | ICD-10-CM | POA: Insufficient documentation

## 2013-10-04 DIAGNOSIS — Z23 Encounter for immunization: Secondary | ICD-10-CM | POA: Insufficient documentation

## 2013-10-04 DIAGNOSIS — W278XXA Contact with other nonpowered hand tool, initial encounter: Secondary | ICD-10-CM | POA: Insufficient documentation

## 2013-10-04 DIAGNOSIS — Z79899 Other long term (current) drug therapy: Secondary | ICD-10-CM | POA: Insufficient documentation

## 2013-10-04 DIAGNOSIS — Y9389 Activity, other specified: Secondary | ICD-10-CM | POA: Insufficient documentation

## 2013-10-04 DIAGNOSIS — Z87891 Personal history of nicotine dependence: Secondary | ICD-10-CM | POA: Insufficient documentation

## 2013-10-04 DIAGNOSIS — Z7902 Long term (current) use of antithrombotics/antiplatelets: Secondary | ICD-10-CM | POA: Insufficient documentation

## 2013-10-04 DIAGNOSIS — Y92009 Unspecified place in unspecified non-institutional (private) residence as the place of occurrence of the external cause: Secondary | ICD-10-CM | POA: Insufficient documentation

## 2013-10-04 DIAGNOSIS — N186 End stage renal disease: Secondary | ICD-10-CM | POA: Insufficient documentation

## 2013-10-04 DIAGNOSIS — E785 Hyperlipidemia, unspecified: Secondary | ICD-10-CM | POA: Insufficient documentation

## 2013-10-04 DIAGNOSIS — Z794 Long term (current) use of insulin: Secondary | ICD-10-CM | POA: Insufficient documentation

## 2013-10-04 DIAGNOSIS — Z992 Dependence on renal dialysis: Secondary | ICD-10-CM | POA: Insufficient documentation

## 2013-10-04 MED ORDER — TETANUS-DIPHTH-ACELL PERTUSSIS 5-2.5-18.5 LF-MCG/0.5 IM SUSP
0.5000 mL | Freq: Once | INTRAMUSCULAR | Status: AC
  Administered 2013-10-04: 0.5 mL via INTRAMUSCULAR
  Filled 2013-10-04: qty 0.5

## 2013-10-04 MED ORDER — CEPHALEXIN 500 MG PO CAPS: 500.0000 mg | ORAL_CAPSULE | Freq: Two times a day (BID) | ORAL | Status: DC

## 2013-10-04 NOTE — ED Notes (Signed)
Pt st's she was clipping her toenail and accidentally cut her toe.  Pt has very small lac to left great toe.  No active bleeding at this time.

## 2013-10-04 NOTE — ED Provider Notes (Signed)
CSN: [id number]     Arrival date & time 10/04/13  1724 History  This chart was scribed for non-physician practitioner, Santiago Glad, PA-C working with Glynn Octave, MD by Greggory Stallion, ED scribe. This patient was seen in room TR10C/TR10C and the patient's care was started at 6:04 PM.   Chief Complaint  Patient presents with  . Toe Injury   The history is provided by the patient. No language interpreter was used.   HPI Comments: [name] is a 72 y.o. female with history of diabetes who presents to the Emergency Department complaining of left great toe injury that occurred around noon today. States she was clipping her toenails and accidentally cut her toe with the clippers. She has sudden onset left great toe pain. Bleeding controlled at this time.  Pt is on coumadin. She is unsure when her last tetanus was.   Past Medical History  Diagnosis Date  . CAD (coronary artery disease)   . Hypertension   . Diabetes mellitus     type 2 insulin dependent  . Hyperlipidemia   . CHF (congestive heart failure)   . Cellulitis     left foot  . Ulcer     diabetic ulcer on left foot  . DVT (deep venous thrombosis)     peroneal vein  . Peripheral vascular disease   . Chronic kidney disease   . Anemia   . Shortness of breath     with fluid  . Insomnia    Past Surgical History  Procedure Laterality Date  . Tubal ligation    . Pr vein bypass graft,aorto-fem-pop  06-26-11    1. PATENT LEFT FEMORAL-POPLITEAL BYPASS GRAFT W/NO EVEIDENCE OF STENOSIS. 2.VELOCITIES OF GREATER THAN 200 CM/'s NOTED ON PREVIOUS EXAM 10/03/11 WERE NOT ADEQUATELY VISULAIZED DURING THIS EXAM  . Coronary artery bypass graft  04/25/08    Dr. Tyrone Sage, CABG x 5   . Nm myoview ltd  01/13/13    LEXISCAN; LV WALL MOTION: LVEF 44%, INFERIOR AKINESIS, ANTEROAPICAL HYPOKINESIS  . Cardiac catheterization  04/22/08    SEVERE 3-VESSEL DISEASE CAD., CABG X 5 04/25/08 WITH DR. NWGNFAOZ  . Av fistula placement Left 09/07/2013     Procedure: ARTERIOVENOUS (AV) FISTULA CREATION;  Surgeon: Sherren Kerns, MD;  Location: [facility] OR;  Service: Vascular;  Laterality: Left;   Family History  Problem Relation Age of Onset  . Cancer Mother     Kidney  . Kidney disease Mother   . Cancer Father     skin   History  Substance Use Topics  . Smoking status: Former Smoker -- 2.00 packs/day for 20 years    Types: Cigarettes    Quit date: 07/15/1996  . Smokeless tobacco: Former Neurosurgeon    Types: Snuff  . Alcohol Use: No   OB History   Grav Para Term Preterm Abortions TAB SAB Ect Mult Living                 Review of Systems  Musculoskeletal: Positive for arthralgias.  Skin: Positive for wound.  All other systems reviewed and are negative.    Allergies  Ivp dye and Aspirin  Home Medications   Current Outpatient Rx  Name  Route  Sig  Dispense  Refill  . amLODipine (NORVASC) 5 MG tablet   Oral   Take 10 mg by mouth daily.          [name] atorvastatin (LIPITOR) 40 MG tablet   Oral   Take 40 mg  by mouth daily.           . carvedilol (COREG) 12.5 MG tablet   Oral   Take 12.5 mg by mouth 2 (two) times daily with a meal.           . clopidogrel (PLAVIX) 75 MG tablet   Oral   Take 75 mg by mouth daily.           . ferrous sulfate 325 (65 FE) MG tablet   Oral   Take 325 mg by mouth 2 (two) times daily.           . folic acid (FOLVITE) 1 MG tablet   Oral   Take 1 mg by mouth daily.           . furosemide (LASIX) 40 MG tablet   Oral   Take 2 tablets (80 mg total) by mouth 2 (two) times daily.   120 tablet   1   . insulin aspart (NOVOLOG) 100 UNIT/ML injection   Subcutaneous   Inject 20-26 Units into the skin 3 (three) times daily before meals. Take 20 units at breakfast and lunch. Take 26 units at dinner Sliding scale         . isosorbide mononitrate (IMDUR) 30 MG 24 hr tablet   Oral   Take 0.5 tablets (15 mg total) by mouth daily.   90 tablet   3     Take 1/2 tablet by mouth daily   .  niacin (NIASPAN) 500 MG CR tablet   Oral   Take 500 mg by mouth at bedtime.           . nitroGLYCERIN (NITROSTAT) 0.4 MG SL tablet   Sublingual   Place 0.4 mg under the tongue every 5 (five) minutes as needed for chest pain.         [name] omega-3 acid ethyl esters (LOVAZA) 1 G capsule   Oral   Take 1 g by mouth 4 (four) times daily.          [name] omeprazole (PRILOSEC) 20 MG capsule   Oral   Take 20 mg by mouth daily.           [name] oxyCODONE-acetaminophen (PERCOCET/ROXICET) 5-325 MG per tablet   Oral   Take 1 tablet by mouth every 6 (six) hours as needed for moderate pain.   30 tablet   0   . polyethylene glycol (MIRALAX / GLYCOLAX) packet   Oral   Take 17 g by mouth daily.           [name] senna (SENOKOT) 8.6 MG tablet   Oral   Take 1 tablet by mouth at bedtime.         [name] warfarin (COUMADIN) 4 MG tablet   Oral   Take 4-6 mg by mouth. TAKES 1 TABLET (4 MG) MON, WEDS, FRI & SUN.  TAKES 1.5 TABLET (6 MG) TUES, THURS, SAT.          BP 193/51  Pulse 72  Temp(Src) 97.7 F (36.5 C) (Oral)  Resp 18  Ht 5\' 3"  (1.6 m)  Wt 139 lb (63.05 kg)  BMI 24.63 kg/m2  SpO2 99%  Physical Exam  Nursing note and vitals reviewed. Constitutional: She appears well-developed and well-nourished.  HENT:  Head: Normocephalic and atraumatic.  Mouth/Throat: Oropharynx is clear and moist.  Eyes: EOM are normal. Pupils are equal, round, and reactive to light.  Neck: Normal range of motion. Neck supple.  Cardiovascular: Normal rate, regular rhythm  and normal heart sounds.   Pulmonary/Chest: Effort normal and breath sounds normal. She has no wheezes. She has no rhonchi. She has no rales.  Musculoskeletal: Normal range of motion.  Neurological: She is alert.  Sensation intact.   Skin: Skin is warm and dry.  4 mm superficial linear laceration, non gaping, to tip of the left great toe. No active bleeding.   Psychiatric: She has a normal mood and affect. Her behavior is normal.    ED Course   Procedures (including critical care time)  DIAGNOSTIC STUDIES: Oxygen Saturation is 99% on RA, normal by my interpretation.    COORDINATION OF CARE: 6:08 PM-Discussed treatment plan which includes dressing wound and updating tetanus with pt at bedside and pt agreed to plan.   Labs Review Labs Reviewed - No data to display Imaging Review No results found.  EKG Interpretation   None       MDM  No diagnosis found. Patient presenting with a small superficial non gaping laceration of her toe that she sustained from a toe nail clipper.  Laceration cleaned well in the ED and antibiotic ointment applied to the wound.  Sutures not indicated at this time.  Tetanus updated.  Patient stable for discharge.  Return precautions given.  I personally performed the services described in this documentation, which was scribed in my presence. The recorded information has been reviewed and is accurate.   Santiago Glad, PA-C 10/04/13 2357

## 2013-10-04 NOTE — ED Notes (Signed)
Pt c/o left great toe small laceration from hitting on something at home today; bleeding controlled with dressing; pt sts takes coumadin

## 2013-10-05 NOTE — ED Provider Notes (Signed)
Medical screening examination/treatment/procedure(s) were performed by non-physician practitioner and as supervising physician I was immediately available for consultation/collaboration.  EKG Interpretation   None         Ramata Strothman, MD 10/05/13 0010 

## 2013-10-13 ENCOUNTER — Encounter (HOSPITAL_BASED_OUTPATIENT_CLINIC_OR_DEPARTMENT_OTHER): Payer: Medicare Other | Attending: General Surgery

## 2013-10-20 ENCOUNTER — Encounter: Payer: Self-pay | Admitting: Vascular Surgery

## 2013-10-21 ENCOUNTER — Encounter: Payer: Medicare Other | Admitting: Vascular Surgery

## 2013-11-10 ENCOUNTER — Other Ambulatory Visit (HOSPITAL_COMMUNITY): Payer: Self-pay | Admitting: *Deleted

## 2013-11-11 ENCOUNTER — Encounter (HOSPITAL_COMMUNITY)
Admission: RE | Admit: 2013-11-11 | Discharge: 2013-11-11 | Disposition: A | Discharge status: Home / Self Care | Payer: Medicare Other | Source: Ambulatory Visit | Attending: Nephrology | Admitting: Nephrology

## 2013-11-11 DIAGNOSIS — D631 Anemia in chronic kidney disease: Secondary | ICD-10-CM | POA: Insufficient documentation

## 2013-11-11 DIAGNOSIS — N183 Chronic kidney disease, stage 3 unspecified: Secondary | ICD-10-CM | POA: Insufficient documentation

## 2013-11-11 DIAGNOSIS — N039 Chronic nephritic syndrome with unspecified morphologic changes: Principal | ICD-10-CM

## 2013-11-11 LAB — POCT HEMOGLOBIN-HEMACUE: Hemoglobin: 8.4 g/dL — ABNORMAL LOW (ref 12.0–15.0)

## 2013-11-11 MED ORDER — FERUMOXYTOL INJECTION 510 MG/17 ML
INTRAVENOUS | Status: AC
  Filled 2013-11-11: qty 17

## 2013-11-11 MED ORDER — SODIUM CHLORIDE 0.9 % IV SOLN
Freq: Once | INTRAVENOUS | Status: AC
  Administered 2013-11-11: 10:00:00 via INTRAVENOUS

## 2013-11-11 MED ORDER — FERUMOXYTOL INJECTION 510 MG/17 ML
510.0000 mg | Freq: Once | INTRAVENOUS | Status: AC
  Administered 2013-11-11: 10:00:00 510 mg via INTRAVENOUS

## 2013-11-11 MED ORDER — EPOETIN ALFA 20000 UNIT/ML IJ SOLN
INTRAMUSCULAR | Status: AC
  Filled 2013-11-11: qty 1

## 2013-11-11 MED ORDER — EPOETIN ALFA 20000 UNIT/ML IJ SOLN
20000.0000 [IU] | INTRAMUSCULAR | Status: DC
  Administered 2013-11-11: 20000 [IU] via SUBCUTANEOUS

## 2013-11-25 ENCOUNTER — Encounter (HOSPITAL_COMMUNITY)
Admission: RE | Admit: 2013-11-25 | Discharge: 2013-11-25 | Disposition: A | Discharge status: Home / Self Care | Payer: Medicare Other | Source: Ambulatory Visit | Attending: Nephrology | Admitting: Nephrology

## 2013-11-25 DIAGNOSIS — N183 Chronic kidney disease, stage 3 unspecified: Secondary | ICD-10-CM | POA: Insufficient documentation

## 2013-11-25 DIAGNOSIS — D631 Anemia in chronic kidney disease: Secondary | ICD-10-CM | POA: Insufficient documentation

## 2013-11-25 DIAGNOSIS — N039 Chronic nephritic syndrome with unspecified morphologic changes: Principal | ICD-10-CM

## 2013-11-25 LAB — IRON AND TIBC
Iron: 30 ug/dL — ABNORMAL LOW (ref 42–135)
Saturation Ratios: 15 % — ABNORMAL LOW (ref 20–55)
TIBC: 197 ug/dL — ABNORMAL LOW (ref 250–470)
UIBC: 167 ug/dL (ref 125–400)

## 2013-11-25 LAB — FERRITIN: FERRITIN: 554 ng/mL — AB (ref 10–291)

## 2013-11-25 LAB — POCT HEMOGLOBIN-HEMACUE: HEMOGLOBIN: 8.7 g/dL — AB (ref 12.0–15.0)

## 2013-11-25 MED ORDER — EPOETIN ALFA 20000 UNIT/ML IJ SOLN
20000.0000 [IU] | INTRAMUSCULAR | Status: DC
  Administered 2013-11-25: 20000 [IU] via SUBCUTANEOUS

## 2013-11-25 MED ORDER — EPOETIN ALFA 20000 UNIT/ML IJ SOLN
INTRAMUSCULAR | Status: AC
  Filled 2013-11-25: qty 1

## 2013-12-02 ENCOUNTER — Encounter: Payer: Medicare Other | Admitting: Physician Assistant

## 2013-12-03 ENCOUNTER — Encounter: Payer: Self-pay | Admitting: Internal Medicine

## 2013-12-08 ENCOUNTER — Ambulatory Visit: Payer: Medicare Other | Admitting: Physician Assistant

## 2013-12-09 ENCOUNTER — Inpatient Hospital Stay (HOSPITAL_COMMUNITY): Admission: RE | Admit: 2013-12-09 | Payer: Medicare Other | Source: Ambulatory Visit

## 2013-12-14 ENCOUNTER — Encounter: Payer: Self-pay | Admitting: Internal Medicine

## 2013-12-14 ENCOUNTER — Ambulatory Visit (INDEPENDENT_AMBULATORY_CARE_PROVIDER_SITE_OTHER): Payer: Medicare Other | Admitting: Internal Medicine

## 2013-12-14 VITALS — BP 130/64 | HR 74 | Ht 63.0 in | Wt 153.9 lb

## 2013-12-14 DIAGNOSIS — G4733 Obstructive sleep apnea (adult) (pediatric): Secondary | ICD-10-CM

## 2013-12-14 DIAGNOSIS — I10 Essential (primary) hypertension: Secondary | ICD-10-CM

## 2013-12-14 DIAGNOSIS — I5022 Chronic systolic (congestive) heart failure: Secondary | ICD-10-CM

## 2013-12-14 DIAGNOSIS — Z9989 Dependence on other enabling machines and devices: Secondary | ICD-10-CM

## 2013-12-14 DIAGNOSIS — N184 Chronic kidney disease, stage 4 (severe): Secondary | ICD-10-CM

## 2013-12-14 DIAGNOSIS — Z951 Presence of aortocoronary bypass graft: Secondary | ICD-10-CM

## 2013-12-14 DIAGNOSIS — I739 Peripheral vascular disease, unspecified: Secondary | ICD-10-CM

## 2013-12-14 NOTE — Patient Instructions (Signed)
Your physician wants you to follow-up in:  6 months. You will receive a reminder letter in the mail two months in advance. If you don't receive a letter, please call our office to schedule the follow-up appointment.   

## 2013-12-14 NOTE — Progress Notes (Signed)
OFFICE NOTE  Chief Complaint:  Dyspnea on exertion, return office visit  Primary Care Physician: Dwan Bolt, MD  HPI:  [name] is a 73 year old female with a history of CABG in 2009 x 5 vessls, dyslipidemia, hypertension, and diabetes. Last summer, she had been having some increasing shortness of breath and underwent a MET-TEST, which was a poor effort, but her VO2 was markedly reduced at 29%. It is difficult, therefore, to interpret the test; however, the reduced VO2 is concerning. There was a slight reduction in diffusion capacity in her PFTs and some mild obstruction, and I think COPD probably plays a role in things. Recently, though, she has been more short of breath with elevated heart rate. She has had pressure in her chest and difficulty when doing activities. She underwent a stress test in our office to further characterize this, which was a [place] nuclear stress test, performed on January 13, 2013. This was interpreted as a high-risk study. There were scattered PVCs during the study. EF was 44% with inferior akinesis and anteroapical hypokinesis. There was a reversible defect in the mid and distal anterior wall and apex consistent with ischemia, and a fixed defect inferiorly consistent with scar. Based on these findings, cardiac catheterization was recommended. Standard preoperative testing however revealed anemia, and marked chronic kidney disease which was unknown. She also had an abnormality on her chest x-ray concerning for possible pneumonia or mass. Catheterization was then canceled and prior to seeing her back in the office, she presented to the emergency department with an episode of epistaxis which is easily controlled. She subsequently underwent a CT scan of the chest which reveals chronic scarring and perhaps a reactive pleural-based nodule. She is multivessel disease which is known and is status post CABG.  assess suspected that some of her shortness of breath was  deemed to worsening renal failure. I referred to see Dr. Lorrene Reid at [facility] in May he started to work toward possible dialysis. She was recently seen and admitted to [facility] hospital for worsening shortness of breath and pulmonary edema mostly due to progressive renal dysfunction. She was diureses, placed on a nitroglycerin drip and her medications were adjusted but she did not have dialysis. She was evaluated for fistula placement and successfully had that placed.  She returns today and reports that she is not currently having any chest discomfort. She does report shortness of breath with exertion and is complaining of lower extremity swelling. Just has a lot of fatigue which again I believe this is related to end-stage renal disease. According to [name] she's been told by her nephrologist that she is not yet ready to start dialysis.  PMHx:  Past Medical History  Diagnosis Date  . CAD (coronary artery disease)   . Hypertension   . Diabetes mellitus     type 2 insulin dependent  . Hyperlipidemia   . CHF (congestive heart failure)   . Cellulitis     left foot  . Ulcer     diabetic ulcer on left foot  . DVT (deep venous thrombosis)     peroneal vein  . Peripheral vascular disease   . Chronic kidney disease   . Anemia   . Shortness of breath     with fluid  . Insomnia     Past Surgical History  Procedure Laterality Date  . Tubal ligation    . Pr vein bypass graft,aorto-fem-pop  06-26-11    1. PATENT LEFT FEMORAL-POPLITEAL BYPASS GRAFT  W/NO EVEIDENCE OF STENOSIS. 2.VELOCITIES OF GREATER THAN 200 CM/'s NOTED ON PREVIOUS EXAM 10/03/11 WERE NOT ADEQUATELY VISULAIZED DURING THIS EXAM  . Coronary artery bypass graft  04/25/08    Dr. Servando Snare, CABG x 5   . Nm myoview ltd  01/13/13    LEXISCAN; LV WALL MOTION: LVEF 44%, INFERIOR AKINESIS, ANTEROAPICAL HYPOKINESIS  . Cardiac catheterization  04/22/08    SEVERE 3-VESSEL DISEASE CAD., CABG X 5 04/25/08 WITH DR. [email or web address]  . Av  fistula placement Left 09/07/2013    Procedure: ARTERIOVENOUS (AV) FISTULA CREATION;  Surgeon: Elam Dutch, MD;  Location: [facility] OR;  Service: Vascular;  Laterality: Left;    FAMHx:  Family History  Problem Relation Age of Onset  . Cancer Mother     Kidney  . Kidney disease Mother   . Cancer Father     skin    SOCHx:   reports that she quit smoking about 17 years ago. Her smoking use included Cigarettes. She has a 40 pack-year smoking history. She has quit using smokeless tobacco. Her smokeless tobacco use included Snuff. She reports that she does not drink alcohol or use illicit drugs.  ALLERGIES:  Allergies  Allergen Reactions  . Ivp Dye [Iodinated Diagnostic Agents]   . Aspirin Hives    ROS: A comprehensive review of systems was negative except for: Constitutional: positive for fatigue Respiratory: positive for dyspnea on exertion Cardiovascular: positive for dyspnea Hematologic/lymphatic: positive for anemia  HOME MEDS: Current Outpatient Prescriptions  Medication Sig Dispense Refill  . amLODipine (NORVASC) 5 MG tablet Take 10 mg by mouth daily.       [name] atorvastatin (LIPITOR) 40 MG tablet Take 40 mg by mouth daily.        . calcitRIOL (ROCALTROL) 0.25 MCG capsule Take 0.5 mcg by mouth daily.      . carvedilol (COREG) 12.5 MG tablet Take 12.5 mg by mouth 2 (two) times daily with a meal.        . cephALEXin (KEFLEX) 500 MG capsule Take 1 capsule (500 mg total) by mouth 2 (two) times daily.  10 capsule  0  . ferrous sulfate 325 (65 FE) MG tablet Take 325 mg by mouth 2 (two) times daily.        . folic acid (FOLVITE) 1 MG tablet Take 1 mg by mouth daily.        . furosemide (LASIX) 40 MG tablet Take 2 tablets (80 mg total) by mouth 2 (two) times daily.  120 tablet  1  . insulin aspart (NOVOLOG) 100 UNIT/ML injection Inject 20-26 Units into the skin 3 (three) times daily before meals. Take 20 units at breakfast and lunch. Take 26 units at dinner Sliding scale      .  isosorbide mononitrate (IMDUR) 30 MG 24 hr tablet Take 0.5 tablets (15 mg total) by mouth daily.  90 tablet  3  . nitroGLYCERIN (NITROSTAT) 0.4 MG SL tablet Place 0.4 mg under the tongue every 5 (five) minutes as needed for chest pain.      [name] omega-3 acid ethyl esters (LOVAZA) 1 G capsule Take 1 g by mouth 4 (four) times daily.       [name] omeprazole (PRILOSEC) 20 MG capsule Take 20 mg by mouth daily.        [name] warfarin (COUMADIN) 4 MG tablet Take 4-6 mg by mouth. TAKES 1 TABLET (4 MG) MON, WEDS, FRI & SUN.  TAKES 1.5 TABLET (6 MG) TUES, THURS, SAT.  No current facility-administered medications for this visit.    LABS/IMAGING: No results found for this or any previous visit (from the past 48 hour(s)). No results found.  VITALS: BP 130/64  Pulse 74  Ht _0  (1.6 m)  Wt 153 lb 14.4 oz (69.809 kg)  BMI 27.27 kg/m2  EXAM: General appearance: alert and no distress Neck: no adenopathy, no carotid bruit, no JVD, supple, symmetrical, trachea midline and thyroid not enlarged, symmetric, no tenderness/mass/nodules Lungs: clear to auscultation bilaterally Heart: regular rate and rhythm, S1, S2 normal, no murmur, click, rub or gallop Abdomen: Obese, soft nontender Extremities: extremities normal, atraumatic, no cyanosis or edema and LUE fistula, + thrill Pulses: 2+ and symmetric Skin: Skin color, texture, turgor normal. No rashes or lesions Neurologic: Grossly normal  EKG: Sinus rhythm at 74 with a inferior infarct and clear Q waves in 2, 3 and aVF as well as a right bundle-branch pattern  ASSESSMENT: 1. CABG x5 vessels, with probable angina which will be medically managed 2. Ischemic cardiomyopathy, EF 44% with inferior scar and peri-infarct ischemia., [place] heart association class II symptoms. - recent exacerbation 3. CKD 5 - s/p dialysis fistula 4.   Hypertension  5.   Dyslipidemia 6.   Obstructive sleep apnea on CPAP 7.   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    [name] has heart failure with an inferior MI and probably occluded or near occluded graft. She was having symptoms concerning for angina, but her chest pain has improved. She is breathing much better after treatment for possible heart failure versus worsening chronic kidney disease. I agree with placement of a fistula.  She appears volume overloaded chronically and is complaining of fatigue, which may mean that she needs to start dialysis in the near future. For now, I would recommend continuing her current medical regimen and if she has recurrent chest pain, I would consider cardiac catheterization based on her abnormal stress test in April.  Followup in 6 months or sooner as necessary.  Pixie Casino, MD, [facility] Attending Cardiologist [facility] 12/14/2013, 4:17 PM

## 2013-12-21 ENCOUNTER — Other Ambulatory Visit (HOSPITAL_COMMUNITY): Payer: Self-pay | Admitting: *Deleted

## 2013-12-22 ENCOUNTER — Emergency Department (HOSPITAL_COMMUNITY): Payer: Medicare Other

## 2013-12-22 ENCOUNTER — Encounter (HOSPITAL_COMMUNITY): Payer: Self-pay | Admitting: Emergency Medicine

## 2013-12-22 ENCOUNTER — Encounter (HOSPITAL_COMMUNITY): Payer: Medicare Other

## 2013-12-22 ENCOUNTER — Emergency Department (HOSPITAL_COMMUNITY)
Admission: EM | Admit: 2013-12-22 | Discharge: 2013-12-22 | Disposition: A | Discharge status: Home / Self Care | Payer: Medicare Other | Attending: Emergency Medicine | Admitting: Emergency Medicine

## 2013-12-22 DIAGNOSIS — Z794 Long term (current) use of insulin: Secondary | ICD-10-CM | POA: Insufficient documentation

## 2013-12-22 DIAGNOSIS — X58XXXA Exposure to other specified factors, initial encounter: Secondary | ICD-10-CM | POA: Insufficient documentation

## 2013-12-22 DIAGNOSIS — L98499 Non-pressure chronic ulcer of skin of other sites with unspecified severity: Secondary | ICD-10-CM | POA: Insufficient documentation

## 2013-12-22 DIAGNOSIS — Z9889 Other specified postprocedural states: Secondary | ICD-10-CM | POA: Insufficient documentation

## 2013-12-22 DIAGNOSIS — S90129A Contusion of unspecified lesser toe(s) without damage to nail, initial encounter: Secondary | ICD-10-CM | POA: Insufficient documentation

## 2013-12-22 DIAGNOSIS — M7989 Other specified soft tissue disorders: Secondary | ICD-10-CM | POA: Insufficient documentation

## 2013-12-22 DIAGNOSIS — I509 Heart failure, unspecified: Secondary | ICD-10-CM | POA: Insufficient documentation

## 2013-12-22 DIAGNOSIS — R0609 Other forms of dyspnea: Secondary | ICD-10-CM | POA: Insufficient documentation

## 2013-12-22 DIAGNOSIS — E1169 Type 2 diabetes mellitus with other specified complication: Secondary | ICD-10-CM | POA: Insufficient documentation

## 2013-12-22 DIAGNOSIS — M79674 Pain in right toe(s): Secondary | ICD-10-CM

## 2013-12-22 DIAGNOSIS — G589 Mononeuropathy, unspecified: Secondary | ICD-10-CM | POA: Insufficient documentation

## 2013-12-22 DIAGNOSIS — I129 Hypertensive chronic kidney disease with stage 1 through stage 4 chronic kidney disease, or unspecified chronic kidney disease: Secondary | ICD-10-CM | POA: Insufficient documentation

## 2013-12-22 DIAGNOSIS — Z951 Presence of aortocoronary bypass graft: Secondary | ICD-10-CM | POA: Insufficient documentation

## 2013-12-22 DIAGNOSIS — Z86718 Personal history of other venous thrombosis and embolism: Secondary | ICD-10-CM | POA: Insufficient documentation

## 2013-12-22 DIAGNOSIS — E785 Hyperlipidemia, unspecified: Secondary | ICD-10-CM | POA: Insufficient documentation

## 2013-12-22 DIAGNOSIS — I5023 Acute on chronic systolic (congestive) heart failure: Secondary | ICD-10-CM | POA: Insufficient documentation

## 2013-12-22 DIAGNOSIS — Y929 Unspecified place or not applicable: Secondary | ICD-10-CM | POA: Insufficient documentation

## 2013-12-22 DIAGNOSIS — Z79899 Other long term (current) drug therapy: Secondary | ICD-10-CM | POA: Insufficient documentation

## 2013-12-22 DIAGNOSIS — Y939 Activity, unspecified: Secondary | ICD-10-CM | POA: Insufficient documentation

## 2013-12-22 DIAGNOSIS — I2510 Atherosclerotic heart disease of native coronary artery without angina pectoris: Secondary | ICD-10-CM | POA: Insufficient documentation

## 2013-12-22 DIAGNOSIS — Z87891 Personal history of nicotine dependence: Secondary | ICD-10-CM | POA: Insufficient documentation

## 2013-12-22 DIAGNOSIS — N189 Chronic kidney disease, unspecified: Secondary | ICD-10-CM | POA: Insufficient documentation

## 2013-12-22 DIAGNOSIS — D649 Anemia, unspecified: Secondary | ICD-10-CM | POA: Insufficient documentation

## 2013-12-22 DIAGNOSIS — R0989 Other specified symptoms and signs involving the circulatory and respiratory systems: Secondary | ICD-10-CM | POA: Insufficient documentation

## 2013-12-22 DIAGNOSIS — R0602 Shortness of breath: Secondary | ICD-10-CM | POA: Insufficient documentation

## 2013-12-22 DIAGNOSIS — R609 Edema, unspecified: Secondary | ICD-10-CM | POA: Insufficient documentation

## 2013-12-22 DIAGNOSIS — Z7901 Long term (current) use of anticoagulants: Secondary | ICD-10-CM | POA: Insufficient documentation

## 2013-12-22 LAB — I-STAT CHEM 8, ED
BUN: 80 mg/dL — ABNORMAL HIGH (ref 6–23)
Calcium, Ion: 1.18 mmol/L (ref 1.13–1.30)
Chloride: 105 mEq/L (ref 96–112)
Creatinine, Ser: 5.6 mg/dL — ABNORMAL HIGH (ref 0.50–1.10)
Glucose, Bld: 246 mg/dL — ABNORMAL HIGH (ref 70–99)
HEMATOCRIT: 33 % — AB (ref 36.0–46.0)
HEMOGLOBIN: 11.2 g/dL — AB (ref 12.0–15.0)
POTASSIUM: 3.9 meq/L (ref 3.7–5.3)
SODIUM: 143 meq/L (ref 137–147)
TCO2: 22 mmol/L (ref 0–100)

## 2013-12-22 LAB — URINALYSIS, ROUTINE W REFLEX MICROSCOPIC
BILIRUBIN URINE: NEGATIVE
Glucose, UA: 100 mg/dL — AB
Ketones, ur: NEGATIVE mg/dL
Leukocytes, UA: NEGATIVE
NITRITE: NEGATIVE
PH: 5 (ref 5.0–8.0)
Protein, ur: 100 mg/dL — AB
SPECIFIC GRAVITY, URINE: 1.017 (ref 1.005–1.030)
Urobilinogen, UA: 0.2 mg/dL (ref 0.0–1.0)

## 2013-12-22 LAB — URINE MICROSCOPIC-ADD ON

## 2013-12-22 LAB — PROTIME-INR
INR: 4.46 — ABNORMAL HIGH (ref 0.00–1.49)
Prothrombin Time: 40.7 seconds — ABNORMAL HIGH (ref 11.6–15.2)

## 2013-12-22 LAB — CBG MONITORING, ED
GLUCOSE-CAPILLARY: 246 mg/dL — AB (ref 70–99)
GLUCOSE-CAPILLARY: 165 mg/dL — AB (ref 70–99)

## 2013-12-22 LAB — I-STAT TROPONIN, ED: TROPONIN I, POC: 0.04 ng/mL (ref 0.00–0.08)

## 2013-12-22 LAB — PRO B NATRIURETIC PEPTIDE: Pro B Natriuretic peptide (BNP): >70000 pg/mL — ABNORMAL HIGH (ref 0–125)

## 2013-12-22 MED ORDER — CLINDAMYCIN HCL 300 MG PO CAPS: 300.0000 mg | ORAL_CAPSULE | Freq: Three times a day (TID) | ORAL | Status: DC

## 2013-12-22 MED ORDER — HYDROCODONE-ACETAMINOPHEN 5-325 MG PO TABS: 1.0000 | ORAL_TABLET | Freq: Four times a day (QID) | ORAL | Status: DC | PRN

## 2013-12-22 MED ORDER — HYDROCODONE-ACETAMINOPHEN 5-325 MG PO TABS
1.0000 | ORAL_TABLET | Freq: Once | ORAL | Status: AC
  Administered 2013-12-22: 1 via ORAL
  Filled 2013-12-22: qty 1

## 2013-12-22 MED ORDER — CLINDAMYCIN HCL 300 MG PO CAPS
300.0000 mg | ORAL_CAPSULE | Freq: Once | ORAL | Status: AC
  Administered 2013-12-22: 300 mg via ORAL
  Filled 2013-12-22: qty 1

## 2013-12-22 NOTE — ED Notes (Signed)
Pt c/o generalized fatigue, sts when she gets up to do something she is just so tired afterwards. Denies cough/fever/n/d/sob/cp. Pt sts she is supposed to start dialysis soon but hasn't gone yet, has fistula in left arm. Denies change in appetite. Nad, skin warm and dry, resp e/u.

## 2013-12-22 NOTE — ED Notes (Signed)
Pt off the floor for XRAY

## 2013-12-22 NOTE — ED Provider Notes (Signed)
CSN: [id number]     Arrival date & time 12/22/13  1327 History   First MD Initiated Contact with Patient 12/22/13 1652     Chief Complaint  Patient presents with  . Fatigue   HPI Comments: 73 yo F hx of CAD s/p CABG, HTN, HLD, DMII, CHF, DVT, CKD, PVD, presents with CC of right great toe pain, and dyspnea.  Right great toe pain: Pt states symptoms started 2 weeks ago.  Denies any trauma.  C/o right great toe pain, with bruising under nail, and redness to overlying skin.  Denies increased warmth, edema, purulent discharge, or fluctance.  Denies fever.  Denies any new breaks in skin.  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  Dyspnea:  Pt recently saw her cardiologist on 3/3 for her CHF.  Lasix was increased at that time to 80 mg twice daily.  Since that time, pt states she has had much increased in UOP.  She endorses slight increase in dyspnea with exertion, and slight increase in bilateral lower extremity edema since her last visit.  Denies fever, chills, CP, palpitations, abdominal pain, nausea, vomiting, diarrhea, or any other symptoms.  Pt has been taking her medications as prescribed.    The history is provided by the patient. No language interpreter was used.    Past Medical History  Diagnosis Date  . CAD (coronary artery disease)   . Hypertension   . Diabetes mellitus     type 2 insulin dependent  . Hyperlipidemia   . CHF (congestive heart failure)   . Cellulitis     left foot  . Ulcer     diabetic ulcer on left foot  . DVT (deep venous thrombosis)     peroneal vein  . Peripheral vascular disease   . Chronic kidney disease   . Anemia   . Shortness of breath     with fluid  . Insomnia    Past Surgical History  Procedure Laterality Date  . Tubal ligation    . Pr vein bypass graft,aorto-fem-pop  06-26-11    1. PATENT LEFT FEMORAL-POPLITEAL BYPASS GRAFT W/NO EVEIDENCE OF STENOSIS. 2.VELOCITIES OF GREATER THAN 200 CM/'s NOTED ON PREVIOUS EXAM 10/03/11 WERE NOT ADEQUATELY  VISULAIZED DURING THIS EXAM  . Coronary artery bypass graft  04/25/08    Dr. Tyrone Sage, CABG x 5   . Nm myoview ltd  01/13/13    LEXISCAN; LV WALL MOTION: LVEF 44%, INFERIOR AKINESIS, ANTEROAPICAL HYPOKINESIS  . Cardiac catheterization  04/22/08    SEVERE 3-VESSEL DISEASE CAD., CABG X 5 04/25/08 WITH DR. [id number]  . Av fistula placement Left 09/07/2013    Procedure: ARTERIOVENOUS (AV) FISTULA CREATION;  Surgeon: Sherren Kerns, MD;  Location: [facility] OR;  Service: Vascular;  Laterality: Left;   Family History  Problem Relation Age of Onset  . Cancer Mother     Kidney  . Kidney disease Mother   . Cancer Father     skin   History  Substance Use Topics  . Smoking status: Former Smoker -- 2.00 packs/day for 20 years    Types: Cigarettes    Quit date: 07/15/1996  . Smokeless tobacco: Former Neurosurgeon    Types: Snuff  . Alcohol Use: No   OB History   Grav Para Term Preterm Abortions TAB SAB Ect Mult Living                 Review of Systems  Constitutional: Negative for fever and chills.  Respiratory: Positive for shortness of  breath. Negative for cough, wheezing and stridor.   Cardiovascular: Positive for leg swelling. Negative for chest pain and palpitations.  Gastrointestinal: Negative for nausea, vomiting, abdominal pain, diarrhea and constipation.  Musculoskeletal: Positive for arthralgias. Negative for myalgias.  Skin: Negative for rash.  Neurological: Negative for dizziness, weakness, light-headedness, numbness and headaches.  Hematological: Negative for adenopathy. Does not bruise/bleed easily.  All other systems reviewed and are negative.      Allergies  Ivp dye and Aspirin  Home Medications   Current Outpatient Rx  Name  Route  Sig  Dispense  Refill  . acetaminophen (TYLENOL) 500 MG tablet   Oral   Take 1,000-1,500 mg by mouth every 6 (six) hours as needed for mild pain or headache.         [name] amLODipine (NORVASC) 5 MG tablet   Oral   Take 10 mg by mouth daily.           [name] atorvastatin (LIPITOR) 40 MG tablet   Oral   Take 40 mg by mouth daily.          . calcitRIOL (ROCALTROL) 0.25 MCG capsule   Oral   Take 0.5 mcg by mouth daily.         . carvedilol (COREG) 12.5 MG tablet   Oral   Take 12.5 mg by mouth 2 (two) times daily with a meal.          . ferrous sulfate 325 (65 FE) MG tablet   Oral   Take 325 mg by mouth 2 (two) times daily.          . folic acid (FOLVITE) 1 MG tablet   Oral   Take 1 mg by mouth daily.          . furosemide (LASIX) 40 MG tablet   Oral   Take 2 tablets (80 mg total) by mouth 2 (two) times daily.   120 tablet   1   . insulin aspart (NOVOLOG) 100 UNIT/ML injection   Subcutaneous   Inject 20-26 Units into the skin 3 (three) times daily before meals. Take 20 units at breakfast and lunch. Take 26 units at dinner Sliding scale         . isosorbide mononitrate (IMDUR) 30 MG 24 hr tablet   Oral   Take 15 mg by mouth daily.         . nitroGLYCERIN (NITROSTAT) 0.4 MG SL tablet   Sublingual   Place 0.4 mg under the tongue every 5 (five) minutes as needed for chest pain.         [name] omega-3 acid ethyl esters (LOVAZA) 1 G capsule   Oral   Take 1 g by mouth 4 (four) times daily.          [name] omeprazole (PRILOSEC) 20 MG capsule   Oral   Take 20 mg by mouth daily.          [name] warfarin (COUMADIN) 4 MG tablet   Oral   Take 4-6 mg by mouth daily at 6 PM. Take 1 tablet (4mg ) by mouth on Monday, Wednesday, Friday, and Saturday.  Take 1.5 tablet (6mg ) by mouth on Tuesday, Thursday, and saturday          BP 155/56  Pulse 86  Temp(Src) 97.6 F (36.4 C) (Oral)  Resp 14  Ht 5\' 3"  (1.6 m)  Wt 152 lb 9.6 oz (69.219 kg)  BMI 27.04 kg/m2  SpO2 99% Physical Exam  Nursing note and vitals reviewed.  Constitutional: She is oriented to person, place, and time. She appears well-developed and well-nourished.  HENT:  Head: Normocephalic and atraumatic.  Right Ear: External ear normal.  Left Ear: External  ear normal.  Nose: Nose normal.  Mouth/Throat: Oropharynx is clear and moist.  Eyes: Conjunctivae and EOM are normal. Pupils are equal, round, and reactive to light.  Neck: Normal range of motion. Neck supple.  Cardiovascular: Normal rate, regular rhythm, normal heart sounds and intact distal pulses.   2+ pitting edema BLE.   Pulmonary/Chest: Effort normal and breath sounds normal. No respiratory distress. She has no wheezes. She has no rales. She exhibits no tenderness.  Abdominal: Soft. Bowel sounds are normal. She exhibits no distension and no mass. There is no tenderness. There is no rebound and no guarding.  Musculoskeletal: Normal range of motion. She exhibits tenderness. She exhibits no edema.  R great toe TTP along distal and proximal phalanges.  Normal ROM.    Neurological: She is alert and oriented to person, place, and time.  Skin: Skin is warm and dry. There is erythema.  R great toe subungual hematoma present.  Some external erythema, but no warmth, no swelling, no purulence, no fluctuance.     ED Course  Procedures (including critical care time) Labs Review Labs Reviewed  PRO B NATRIURETIC PEPTIDE - Abnormal; Notable for the following:    Pro B Natriuretic peptide (BNP) >70000.0 (*)    All other components within normal limits  PROTIME-INR - Abnormal; Notable for the following:    Prothrombin Time 40.7 (*)    INR 4.46 (*)    All other components within normal limits  URINALYSIS, ROUTINE W REFLEX MICROSCOPIC - Abnormal; Notable for the following:    Glucose, UA 100 (*)    Hgb urine dipstick SMALL (*)    Protein, ur 100 (*)    All other components within normal limits  URINE MICROSCOPIC-ADD ON - Abnormal; Notable for the following:    Squamous Epithelial / LPF FEW (*)    Bacteria, UA FEW (*)    All other components within normal limits  I-STAT CHEM 8, ED - Abnormal; Notable for the following:    BUN 80 (*)    Creatinine, Ser 5.60 (*)    Glucose, Bld 246 (*)     Hemoglobin 11.2 (*)    HCT 33.0 (*)    All other components within normal limits  CBG MONITORING, ED - Abnormal; Notable for the following:    Glucose-Capillary 246 (*)    All other components within normal limits  CBG MONITORING, ED - Abnormal; Notable for the following:    Glucose-Capillary 165 (*)    All other components within normal limits  I-STAT TROPOININ, ED   Imaging Review Dg Chest 2 View  12/22/2013   CLINICAL DATA Fatigue. Shortness of breath. Current history of diabetes and hypertension. Prior CABG.  EXAM CHEST  2 VIEW  COMPARISON DG CHEST 2V dated 11/24/2013; DG CHEST 2V dated 08/27/2013; DG CHEST 2 VIEW dated 08/17/2013; CT CHEST W/O CM dated 02/17/2013; DG CHEST 2V dated 07/28/2012  FINDINGS Cardiac silhouette moderately to markedly enlarged but stable. Thoracic aorta atherosclerotic, unchanged. Hilar and mediastinal contours otherwise unremarkable. Diffuse interstitial and airspace pulmonary edema, superimposed upon COPD/emphysema. Nodular scarring in the right lung apex, unchanged. Moderately large right pleural effusion and associated consolidation in the right lower lobe. No visible left pleural effusion. Degenerative changes involving the thoracic spine.  IMPRESSION Mild CHF superimposed upon COPD/emphysema. Moderate size right pleural  effusion and associated passive atelectasis in the right lower lobe.  SIGNATURE  Electronically Signed   By: Hulan Saas M.D.   On: 12/22/2013 19:46   Dg Foot Complete Right  12/22/2013   CLINICAL DATA Pain and swelling involving the medial right foot and the right great toe. Current history of diabetes.  EXAM RIGHT FOOT COMPLETE - 3+ VIEW  COMPARISON None.  FINDINGS No evidence of acute fracture or dislocation. Mild joint space narrowing involving the IP joints of multiple toes. Remaining joint spaces well preserved for age. Mild osseous demineralization. Very small enthesopathic spur at the insertion of the Achilles tendon on the posterior  calcaneus. No other intrinsic osseous abnormalities. Extensive calcification involving the arteries of the foot.  IMPRESSION No acute osseous abnormality. 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  SIGNATURE  Electronically Signed   By: Hulan Saas M.D.   On: 12/22/2013 19:48     EKG Interpretation None      MDM   Final diagnoses:  None   73 yo F hx of CAD s/p CABG, HTN, HLD, DMII, CHF, DVT, CKD, PVD, presents with CC of right great toe pain, and dyspnea.  Filed Vitals:   12/22/13 1800  BP: 166/83  Pulse: 65  Temp:   Resp:    Physical exam as above.  VS WNL, pt afebrile, satting 100% on RA.  C/o slight increased exertional dyspnea, slight increased bilateral lower extremity edema, concerning for CHF exacerbation.  Also c/o of right great toe pain, which appears erythematous, but not warm, not edematous, and with subungual hematoma.  Question of trauma vs infection.  EKG HR 68, PR 152, QRS 118, QT/QTc 438/465, NSR with sinus arrhythmia, RBBB, nonischemic.  CXR with mild CHF changes, R pleural effusion which appears to be resolving compared to prior.  Troponin is WNL.  BNP elevated 70000.  INR supratherapeutic 4.46.  Foot XR negative for fx.  Diagnosis of mild CHF exacerbation, R toe trauma vs early infection.  Pt satting 100%, looks comfortable, not tachypneic, without CP or fever.  It is determined that pt may be managed as outpatient for mild CHF exacerbation.  Pt given dose of Clindamycin for possible R toe infection in ED, along with Norco for pain.  Pt to be d/c home in good condition.  Encouraged to continue supportive care.  Continue increased Lasix dose of 80 mg BID as prescribed.  Rx for Clindamycin, Norco.  F/u with cardiology tomorrow, and PCP in 1 week.  Return precautions given.  Pt understands and agrees with plan.  I have discussed pt's care plan with Dr. Jodi Mourning.  Jon Gills, MD      Jon Gills, MD 12/23/13 [phone]

## 2013-12-22 NOTE — ED Notes (Signed)
Pharmacy notified to send Cleocin STAT

## 2013-12-22 NOTE — ED Notes (Signed)
Pt refuses to be undressed until the Doctor comes to see her, states she is on blood thinners and is too cold.

## 2013-12-22 NOTE — Discharge Instructions (Signed)
Heart Failure °Heart failure is a condition in which the heart has trouble pumping blood. This means your heart does not pump blood efficiently for your body to work well. In some cases of heart failure, fluid may back up into your lungs or you may have swelling (edema) in your lower legs. Heart failure is usually a long-term (chronic) condition. It is important for you to take good care of yourself and follow your caregiver's treatment plan. °CAUSES  °Some health conditions can cause heart failure. Those health conditions include: °· High blood pressure (hypertension) causes the heart muscle to work harder than normal. When pressure in the blood vessels is high, the heart needs to pump (contract) with more force in order to circulate blood throughout the body. High blood pressure eventually causes the heart to become stiff and weak. °· Coronary artery disease (CAD) is the buildup of cholesterol and fat (plaque) in the arteries of the heart. The blockage in the arteries deprives the heart muscle of oxygen and blood. This can cause chest pain and may lead to a heart attack. High blood pressure can also contribute to CAD. °· Heart attack (myocardial infarction) occurs when 1 or more arteries in the heart become blocked. The loss of oxygen damages the muscle tissue of the heart. When this happens, part of the heart muscle dies. The injured tissue does not contract as well and weakens the heart's ability to pump blood. °· Abnormal heart valves can cause heart failure when the heart valves do not open and close properly. This makes the heart muscle pump harder to keep the blood flowing. °· Heart muscle disease (cardiomyopathy or myocarditis) is damage to the heart muscle from a variety of causes. These can include drug or alcohol abuse, infections, or unknown reasons. These can increase the risk of heart failure. °· Lung disease makes the heart work harder because the lungs do not work properly. This can cause a strain  on the heart, leading it to fail. °· Diabetes increases the risk of heart failure. High blood sugar contributes to high fat (lipid) levels in the blood. Diabetes can also cause slow damage to tiny blood vessels that carry important nutrients to the heart muscle. When the heart does not get enough oxygen and food, it can cause the heart to become weak and stiff. This leads to a heart that does not contract efficiently. °· Other conditions can contribute to heart failure. These include abnormal heart rhythms, thyroid problems, and low blood counts (anemia). °Certain unhealthy behaviors can increase the risk of heart failure. Those unhealthy behaviors include: °· Being overweight. °· Smoking or chewing tobacco. °· Eating foods high in fat and cholesterol. °· Abusing illicit drugs or alcohol. °· Lacking physical activity. °SYMPTOMS  °Heart failure symptoms may vary and can be hard to detect. Symptoms may include: °· Shortness of breath with activity, such as climbing stairs. °· Persistent cough. °· Swelling of the feet, ankles, legs, or abdomen. °· Unexplained weight gain. °· Difficulty breathing when lying flat (orthopnea). °· Waking from sleep because of the need to sit up and get more air. °· Rapid heartbeat. °· Fatigue and loss of energy. °· Feeling lightheaded, dizzy, or close to fainting. °· Loss of appetite. °· Nausea. °· Increased urination during the night (nocturia). °DIAGNOSIS  °A diagnosis of heart failure is based on your history, symptoms, physical examination, and diagnostic tests. °Diagnostic tests for heart failure may include: °· Echocardiography. °· Electrocardiography. °· Chest X-ray. °· Blood tests. °· Exercise   stress test. °· Cardiac angiography. °· Radionuclide scans. °TREATMENT  °Treatment is aimed at managing the symptoms of heart failure. Medicines, behavioral changes, or surgical intervention may be necessary to treat heart failure. °· Medicines to help treat heart failure may  include: °· Angiotensin-converting enzyme (ACE) inhibitors. This type of medicine blocks the effects of a blood protein called angiotensin-converting enzyme. ACE inhibitors relax (dilate) the blood vessels and help lower blood pressure. °· Angiotensin receptor blockers. This type of medicine blocks the actions of a blood protein called angiotensin. Angiotensin receptor blockers dilate the blood vessels and help lower blood pressure. °· Water pills (diuretics). Diuretics cause the kidneys to remove salt and water from the blood. The extra fluid is removed through urination. This loss of extra fluid lowers the volume of blood the heart pumps. °· Beta blockers. These prevent the heart from beating too fast and improve heart muscle strength. °· Digitalis. This increases the force of the heartbeat. °· Healthy behavior changes include: °· Obtaining and maintaining a healthy weight. °· Stopping smoking or chewing tobacco. °· Eating heart healthy foods. °· Limiting or avoiding alcohol. °· Stopping illicit drug use. °· Physical activity as directed by your caregiver. °· Surgical treatment for heart failure may include: °· A procedure to open blocked arteries, repair damaged heart valves, or remove damaged heart muscle tissue. °· A pacemaker to improve heart muscle function and control certain abnormal heart rhythms. °· An internal cardioverter defibrillator to treat certain serious abnormal heart rhythms. °· A left ventricular assist device to assist the pumping ability of the heart. °HOME CARE INSTRUCTIONS  °· Take your medicine as directed by your caregiver. Medicines are important in reducing the workload of your heart, slowing the progression of heart failure, and improving your symptoms. °· Do not stop taking your medicine unless directed by your caregiver. °· Do not skip any dose of medicine. °· Refill your prescriptions before you run out of medicine. Your medicines are needed every day. °· Take over-the-counter  medicine only as directed by your caregiver or pharmacist. °· Engage in moderate physical activity if directed by your caregiver. Moderate physical activity can benefit some people. The elderly and people with severe heart failure should consult with a caregiver for physical activity recommendations. °· Eat heart healthy foods. Food choices should be free of trans fat and low in saturated fat, cholesterol, and salt (sodium). Healthy choices include fresh or frozen fruits and vegetables, fish, lean meats, legumes, fat-free or low-fat dairy products, and whole grain or high fiber foods. Talk to a dietitian to learn more about heart healthy foods. °· Limit sodium if directed by your caregiver. Sodium restriction may reduce symptoms of heart failure in some people. Talk to a dietitian to learn more about heart healthy seasonings. °· Use healthy cooking methods. Healthy cooking methods include roasting, grilling, broiling, baking, poaching, steaming, or stir-frying. Talk to a dietitian to learn more about healthy cooking methods. °· Limit fluids if directed by your caregiver. Fluid restriction may reduce symptoms of heart failure in some people. °· Weigh yourself every day. Daily weights are important in the early recognition of excess fluid. You should weigh yourself every morning after you urinate and before you eat breakfast. Wear the same amount of clothing each time you weigh yourself. Record your daily weight. Provide your caregiver with your weight record. °· Monitor and record your blood pressure if directed by your caregiver. °· Check your pulse if directed by your caregiver. °· Lose weight if directed   by your caregiver. Weight loss may reduce symptoms of heart failure in some people. °· Stop smoking or chewing tobacco. Nicotine makes your heart work harder by causing your blood vessels to constrict. Do not use nicotine gum or patches before talking to your caregiver. °· Schedule and attend follow-up visits as  directed by your caregiver. It is important to keep all your appointments. °· Limit alcohol intake to no more than 1 drink per day for nonpregnant women and 2 drinks per day for men. Drinking more than that is harmful to your heart. Tell your caregiver if you drink alcohol several times a week. Talk with your caregiver about whether alcohol is safe for you. If your heart has already been damaged by alcohol or you have severe heart failure, drinking alcohol should be stopped completely. °· Stop illicit drug use. °· Stay up-to-date with immunizations. It is especially important to prevent respiratory infections through current pneumococcal and influenza immunizations. °· Manage other health conditions such as hypertension, diabetes, thyroid disease, or abnormal heart rhythms as directed by your caregiver. °· Learn to manage stress. °· Plan rest periods when fatigued. °· Learn strategies to manage high temperatures. If the weather is extremely hot: °· Avoid vigorous physical activity. °· Use air conditioning or fans or seek a cooler location. °· Avoid caffeine and alcohol. °· Wear loose-fitting, lightweight, and light-colored clothing. °· Learn strategies to manage cold temperatures. If the weather is extremely cold: °· Avoid vigorous physical activity. °· Layer clothes. °· Wear mittens or gloves, a hat, and a scarf when going outside. °· Avoid alcohol. °· Obtain ongoing education and support as needed. °· Participate or seek rehabilitation as needed to maintain or improve independence and quality of life. °SEEK MEDICAL CARE IF:  °· Your weight increases by 03 lb/1.4 kg in 1 day or 05 lb/2.3 kg in a week. °· You have increasing shortness of breath that is unusual for you. °· You are unable to participate in your usual physical activities. °· You tire easily. °· You cough more than normal, especially with physical activity. °· You have any or more swelling in areas such as your hands, feet, ankles, or abdomen. °· You  are unable to sleep because it is hard to breathe. °· You feel like your heart is beating fast (palpitations). °· You become dizzy or lightheaded upon standing up. °SEEK IMMEDIATE MEDICAL CARE IF:  °· You have difficulty breathing. °· There is a change in mental status such as decreased alertness or difficulty with concentration. °· You have a pain or discomfort in your chest. °· You have an episode of fainting (syncope). °MAKE SURE YOU:  °· Understand these instructions. °· Will watch your condition. °· Will get help right away if you are not doing well or get worse. °Document Released: 09/30/2005 Document Revised: 01/25/2013 Document Reviewed: 10/22/2012 °[employer]® Patient Information ©2014 [employer]. ° °

## 2013-12-23 NOTE — Progress Notes (Signed)
Incoming call from [employer] reports when he tried to process patients Norco/ Vicodin prescription she received from [facility] he was unable to process this due  To an Issue with the [employer]A - # CM will liaise with POD E MD.

## 2013-12-23 NOTE — ED Provider Notes (Signed)
Medical screening examination/treatment/procedure(s) were conducted as a shared visit with non-physician practitioner(s) or resident  and myself.  I personally evaluated the patient during the encounter and agree with the findings and plan unless otherwise indicated.    I have personally reviewed any xrays and/ or EKG's with the provider and I agree with interpretation.   Right great toe pain - no fevers, no gout hx, no streaking.  Exam tender dorsal toe with subungual hematoma, nv intact.  Likely mechanical injury with neuropathy hx however with mild erythema/ tenderness and DM hx plan for po abx and close fup oupt.    Dyspnea - similar to previous, pt has cardiology fup.  Lasix change recently.  Well appearing, lungs few rales, mild leg swelling bilateral, no distress. INR mild elevated, will fup with cardiology to adjust.  Mild CHF on CXR/ clinically.  No O2 requirement, well appearing, stable for outpt fup.  Dg Chest 2 View  12/22/2013   CLINICAL DATA Fatigue. Shortness of breath. Current history of diabetes and hypertension. Prior CABG.  EXAM CHEST  2 VIEW  COMPARISON DG CHEST 2V dated 11/24/2013; DG CHEST 2V dated 08/27/2013; DG CHEST 2 VIEW dated 08/17/2013; CT CHEST W/O CM dated 02/17/2013; DG CHEST 2V dated 07/28/2012  FINDINGS Cardiac silhouette moderately to markedly enlarged but stable. Thoracic aorta atherosclerotic, unchanged. Hilar and mediastinal contours otherwise unremarkable. Diffuse interstitial and airspace pulmonary edema, superimposed upon COPD/emphysema. Nodular scarring in the right lung apex, unchanged. Moderately large right pleural effusion and associated consolidation in the right lower lobe. No visible left pleural effusion. Degenerative changes involving the thoracic spine.  IMPRESSION Mild CHF superimposed upon COPD/emphysema. Moderate size right pleural effusion and associated passive atelectasis in the right lower lobe.  SIGNATURE  Electronically Signed   By: Hulan Saas  M.D.   On: 12/22/2013 19:46   Dg Foot Complete Right  12/22/2013   CLINICAL DATA Pain and swelling involving the medial right foot and the right great toe. Current history of diabetes.  EXAM RIGHT FOOT COMPLETE - 3+ VIEW  COMPARISON None.  FINDINGS No evidence of acute fracture or dislocation. Mild joint space narrowing involving the IP joints of multiple toes. Remaining joint spaces well preserved for age. Mild osseous demineralization. Very small enthesopathic spur at the insertion of the Achilles tendon on the posterior calcaneus. No other intrinsic osseous abnormalities. Extensive calcification involving the arteries of the foot.  IMPRESSION No acute osseous abnormality. 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  SIGNATURE  Electronically Signed   By: Hulan Saas M.D.   On: 12/22/2013 19:48   Filed Vitals:   12/22/13 1730 12/22/13 1800 12/22/13 1830 12/22/13 2000  BP: 153/71 166/83 157/72 173/71  Pulse: 67 65 73 71  Temp:      TempSrc:      Resp:      Height:      Weight:      SpO2: 100% 100% 100% 98%   CHF, Right toe pain  Enid Skeens, MD 12/23/13 [phone]

## 2013-12-27 ENCOUNTER — Encounter (HOSPITAL_COMMUNITY): Payer: Medicare Other

## 2013-12-28 ENCOUNTER — Ambulatory Visit: Payer: Self-pay

## 2013-12-30 ENCOUNTER — Other Ambulatory Visit: Payer: Self-pay | Admitting: Podiatry

## 2013-12-30 ENCOUNTER — Encounter (HOSPITAL_COMMUNITY)
Admission: RE | Admit: 2013-12-30 | Discharge: 2013-12-30 | Disposition: A | Discharge status: Home / Self Care | Payer: Medicare Other | Source: Ambulatory Visit | Attending: Nephrology | Admitting: Nephrology

## 2013-12-30 ENCOUNTER — Encounter: Payer: Self-pay | Admitting: Podiatry

## 2013-12-30 ENCOUNTER — Ambulatory Visit (INDEPENDENT_AMBULATORY_CARE_PROVIDER_SITE_OTHER): Payer: Medicare Other | Admitting: Podiatry

## 2013-12-30 VITALS — BP 125/80 | HR 76 | Resp 18 | Ht 63.0 in | Wt 153.0 lb

## 2013-12-30 DIAGNOSIS — N039 Chronic nephritic syndrome with unspecified morphologic changes: Principal | ICD-10-CM

## 2013-12-30 DIAGNOSIS — N185 Chronic kidney disease, stage 5: Secondary | ICD-10-CM | POA: Insufficient documentation

## 2013-12-30 DIAGNOSIS — M79609 Pain in unspecified limb: Secondary | ICD-10-CM

## 2013-12-30 DIAGNOSIS — D631 Anemia in chronic kidney disease: Secondary | ICD-10-CM | POA: Insufficient documentation

## 2013-12-30 DIAGNOSIS — I739 Peripheral vascular disease, unspecified: Secondary | ICD-10-CM

## 2013-12-30 DIAGNOSIS — R230 Cyanosis: Secondary | ICD-10-CM

## 2013-12-30 DIAGNOSIS — B351 Tinea unguium: Secondary | ICD-10-CM

## 2013-12-30 DIAGNOSIS — E1159 Type 2 diabetes mellitus with other circulatory complications: Secondary | ICD-10-CM

## 2013-12-30 LAB — POCT HEMOGLOBIN-HEMACUE: Hemoglobin: 9.8 g/dL — ABNORMAL LOW (ref 12.0–15.0)

## 2013-12-30 LAB — IRON AND TIBC
IRON: 16 ug/dL — AB (ref 42–135)
Saturation Ratios: 8 % — ABNORMAL LOW (ref 20–55)
TIBC: 205 ug/dL — ABNORMAL LOW (ref 250–470)
UIBC: 189 ug/dL (ref 125–400)

## 2013-12-30 LAB — FERRITIN: Ferritin: 452 ng/mL — ABNORMAL HIGH (ref 10–291)

## 2013-12-30 MED ORDER — EPOETIN ALFA 20000 UNIT/ML IJ SOLN
20000.0000 [IU] | INTRAMUSCULAR | Status: DC
  Administered 2013-12-30: 20000 [IU] via SUBCUTANEOUS

## 2013-12-30 MED ORDER — FERUMOXYTOL INJECTION 510 MG/17 ML
INTRAVENOUS | Status: AC
  Filled 2013-12-30: qty 17

## 2013-12-30 MED ORDER — EPOETIN ALFA 20000 UNIT/ML IJ SOLN
INTRAMUSCULAR | Status: AC
  Filled 2013-12-30: qty 1

## 2013-12-30 MED ORDER — SODIUM CHLORIDE 0.9 % IV SOLN
INTRAVENOUS | Status: DC
Start: 2013-12-30 — End: 2013-12-31
  Administered 2013-12-30: 08:00:00 via INTRAVENOUS

## 2013-12-30 MED ORDER — FERUMOXYTOL INJECTION 510 MG/17 ML
510.0000 mg | Freq: Once | INTRAVENOUS | Status: AC
  Administered 2013-12-30: 510 mg via INTRAVENOUS

## 2013-12-30 NOTE — Progress Notes (Signed)
   Subjective:    Patient ID: [name], female    DOB: [birth year], 73 y.o.   MRN: [id number]  HPIN pain in toe       L right 1st toe       D 2 weeks       O no injury        C pain, swelling, and blister.  Pt's right foot and toes are cold in comparison to the left foot.       A none       T Friendly x-rayed states no fracture    Review of Systems  All other systems reviewed and are negative.       Objective:   Physical Exam        Assessment & Plan:

## 2013-12-31 ENCOUNTER — Telehealth: Payer: Self-pay | Admitting: *Deleted

## 2013-12-31 ENCOUNTER — Inpatient Hospital Stay (HOSPITAL_COMMUNITY)
Admission: EM | Admit: 2013-12-31 | Discharge: 2014-01-15 | DRG: 239 | Disposition: A | Discharge status: Skilled Nursing Facility | Payer: Medicare Other | Attending: Internal Medicine | Admitting: Internal Medicine

## 2013-12-31 ENCOUNTER — Ambulatory Visit (INDEPENDENT_AMBULATORY_CARE_PROVIDER_SITE_OTHER)
Admission: RE | Admit: 2013-12-31 | Discharge: 2013-12-31 | Disposition: A | Discharge status: Home / Self Care | Payer: Medicare Other | Source: Ambulatory Visit | Attending: Vascular Surgery | Admitting: Vascular Surgery

## 2013-12-31 ENCOUNTER — Encounter (HOSPITAL_COMMUNITY): Payer: Self-pay | Admitting: Emergency Medicine

## 2013-12-31 DIAGNOSIS — I10 Essential (primary) hypertension: Secondary | ICD-10-CM | POA: Diagnosis present

## 2013-12-31 DIAGNOSIS — M899 Disorder of bone, unspecified: Secondary | ICD-10-CM | POA: Diagnosis present

## 2013-12-31 DIAGNOSIS — R230 Cyanosis: Secondary | ICD-10-CM

## 2013-12-31 DIAGNOSIS — Z515 Encounter for palliative care: Secondary | ICD-10-CM

## 2013-12-31 DIAGNOSIS — I739 Peripheral vascular disease, unspecified: Secondary | ICD-10-CM

## 2013-12-31 DIAGNOSIS — N039 Chronic nephritic syndrome with unspecified morphologic changes: Secondary | ICD-10-CM

## 2013-12-31 DIAGNOSIS — I70209 Unspecified atherosclerosis of native arteries of extremities, unspecified extremity: Secondary | ICD-10-CM | POA: Diagnosis present

## 2013-12-31 DIAGNOSIS — E785 Hyperlipidemia, unspecified: Secondary | ICD-10-CM | POA: Diagnosis present

## 2013-12-31 DIAGNOSIS — E1165 Type 2 diabetes mellitus with hyperglycemia: Secondary | ICD-10-CM | POA: Diagnosis present

## 2013-12-31 DIAGNOSIS — Z87898 Personal history of other specified conditions: Secondary | ICD-10-CM

## 2013-12-31 DIAGNOSIS — N179 Acute kidney failure, unspecified: Secondary | ICD-10-CM | POA: Diagnosis present

## 2013-12-31 DIAGNOSIS — N058 Unspecified nephritic syndrome with other morphologic changes: Secondary | ICD-10-CM | POA: Diagnosis present

## 2013-12-31 DIAGNOSIS — I70269 Atherosclerosis of native arteries of extremities with gangrene, unspecified extremity: Principal | ICD-10-CM | POA: Diagnosis present

## 2013-12-31 DIAGNOSIS — D631 Anemia in chronic kidney disease: Secondary | ICD-10-CM | POA: Diagnosis present

## 2013-12-31 DIAGNOSIS — D638 Anemia in other chronic diseases classified elsewhere: Secondary | ICD-10-CM | POA: Diagnosis present

## 2013-12-31 DIAGNOSIS — Z87891 Personal history of nicotine dependence: Secondary | ICD-10-CM

## 2013-12-31 DIAGNOSIS — Z992 Dependence on renal dialysis: Secondary | ICD-10-CM

## 2013-12-31 DIAGNOSIS — I5023 Acute on chronic systolic (congestive) heart failure: Secondary | ICD-10-CM

## 2013-12-31 DIAGNOSIS — IMO0002 Reserved for concepts with insufficient information to code with codable children: Secondary | ICD-10-CM | POA: Diagnosis present

## 2013-12-31 DIAGNOSIS — N186 End stage renal disease: Secondary | ICD-10-CM | POA: Diagnosis present

## 2013-12-31 DIAGNOSIS — Z951 Presence of aortocoronary bypass graft: Secondary | ICD-10-CM

## 2013-12-31 DIAGNOSIS — D649 Anemia, unspecified: Secondary | ICD-10-CM

## 2013-12-31 DIAGNOSIS — L97509 Non-pressure chronic ulcer of other part of unspecified foot with unspecified severity: Secondary | ICD-10-CM | POA: Diagnosis present

## 2013-12-31 DIAGNOSIS — I498 Other specified cardiac arrhythmias: Secondary | ICD-10-CM | POA: Diagnosis not present

## 2013-12-31 DIAGNOSIS — E440 Moderate protein-calorie malnutrition: Secondary | ICD-10-CM | POA: Diagnosis present

## 2013-12-31 DIAGNOSIS — F411 Generalized anxiety disorder: Secondary | ICD-10-CM | POA: Diagnosis present

## 2013-12-31 DIAGNOSIS — M2420 Disorder of ligament, unspecified site: Secondary | ICD-10-CM | POA: Diagnosis present

## 2013-12-31 DIAGNOSIS — I743 Embolism and thrombosis of arteries of the lower extremities: Secondary | ICD-10-CM | POA: Diagnosis present

## 2013-12-31 DIAGNOSIS — Z91041 Radiographic dye allergy status: Secondary | ICD-10-CM

## 2013-12-31 DIAGNOSIS — I120 Hypertensive chronic kidney disease with stage 5 chronic kidney disease or end stage renal disease: Secondary | ICD-10-CM | POA: Diagnosis present

## 2013-12-31 DIAGNOSIS — J449 Chronic obstructive pulmonary disease, unspecified: Secondary | ICD-10-CM | POA: Diagnosis present

## 2013-12-31 DIAGNOSIS — N184 Chronic kidney disease, stage 4 (severe): Secondary | ICD-10-CM

## 2013-12-31 DIAGNOSIS — J4489 Other specified chronic obstructive pulmonary disease: Secondary | ICD-10-CM | POA: Diagnosis present

## 2013-12-31 DIAGNOSIS — F5105 Insomnia due to other mental disorder: Secondary | ICD-10-CM

## 2013-12-31 DIAGNOSIS — E875 Hyperkalemia: Secondary | ICD-10-CM | POA: Diagnosis not present

## 2013-12-31 DIAGNOSIS — E1159 Type 2 diabetes mellitus with other circulatory complications: Secondary | ICD-10-CM | POA: Diagnosis present

## 2013-12-31 DIAGNOSIS — G4733 Obstructive sleep apnea (adult) (pediatric): Secondary | ICD-10-CM

## 2013-12-31 DIAGNOSIS — M949 Disorder of cartilage, unspecified: Secondary | ICD-10-CM

## 2013-12-31 DIAGNOSIS — Z6827 Body mass index (BMI) 27.0-27.9, adult: Secondary | ICD-10-CM

## 2013-12-31 DIAGNOSIS — I4719 Other supraventricular tachycardia: Secondary | ICD-10-CM

## 2013-12-31 DIAGNOSIS — L02619 Cutaneous abscess of unspecified foot: Secondary | ICD-10-CM | POA: Diagnosis present

## 2013-12-31 DIAGNOSIS — R9439 Abnormal result of other cardiovascular function study: Secondary | ICD-10-CM

## 2013-12-31 DIAGNOSIS — L03039 Cellulitis of unspecified toe: Secondary | ICD-10-CM | POA: Diagnosis present

## 2013-12-31 DIAGNOSIS — L03119 Cellulitis of unspecified part of limb: Secondary | ICD-10-CM

## 2013-12-31 DIAGNOSIS — I509 Heart failure, unspecified: Secondary | ICD-10-CM | POA: Diagnosis present

## 2013-12-31 DIAGNOSIS — I5022 Chronic systolic (congestive) heart failure: Secondary | ICD-10-CM | POA: Diagnosis present

## 2013-12-31 DIAGNOSIS — I4891 Unspecified atrial fibrillation: Secondary | ICD-10-CM | POA: Diagnosis not present

## 2013-12-31 DIAGNOSIS — L98499 Non-pressure chronic ulcer of skin of other sites with unspecified severity: Secondary | ICD-10-CM

## 2013-12-31 DIAGNOSIS — M79609 Pain in unspecified limb: Secondary | ICD-10-CM

## 2013-12-31 DIAGNOSIS — M629 Disorder of muscle, unspecified: Secondary | ICD-10-CM | POA: Diagnosis present

## 2013-12-31 DIAGNOSIS — F418 Other specified anxiety disorders: Secondary | ICD-10-CM

## 2013-12-31 DIAGNOSIS — G609 Hereditary and idiopathic neuropathy, unspecified: Secondary | ICD-10-CM | POA: Diagnosis present

## 2013-12-31 DIAGNOSIS — Z9989 Dependence on other enabling machines and devices: Secondary | ICD-10-CM

## 2013-12-31 DIAGNOSIS — E1169 Type 2 diabetes mellitus with other specified complication: Secondary | ICD-10-CM

## 2013-12-31 DIAGNOSIS — Z66 Do not resuscitate: Secondary | ICD-10-CM | POA: Diagnosis present

## 2013-12-31 DIAGNOSIS — H543 Unqualified visual loss, both eyes: Secondary | ICD-10-CM | POA: Diagnosis present

## 2013-12-31 DIAGNOSIS — G4700 Insomnia, unspecified: Secondary | ICD-10-CM | POA: Diagnosis present

## 2013-12-31 DIAGNOSIS — E1129 Type 2 diabetes mellitus with other diabetic kidney complication: Secondary | ICD-10-CM | POA: Diagnosis present

## 2013-12-31 DIAGNOSIS — N189 Chronic kidney disease, unspecified: Secondary | ICD-10-CM

## 2013-12-31 DIAGNOSIS — Z79899 Other long term (current) drug therapy: Secondary | ICD-10-CM

## 2013-12-31 DIAGNOSIS — Z794 Long term (current) use of insulin: Secondary | ICD-10-CM

## 2013-12-31 DIAGNOSIS — I471 Supraventricular tachycardia: Secondary | ICD-10-CM

## 2013-12-31 DIAGNOSIS — I7092 Chronic total occlusion of artery of the extremities: Secondary | ICD-10-CM | POA: Diagnosis present

## 2013-12-31 LAB — CBC WITH DIFFERENTIAL/PLATELET
BASOS ABS: 0 10*3/uL (ref 0.0–0.1)
Basophils Relative: 0 % (ref 0–1)
EOS PCT: 0 % (ref 0–5)
Eosinophils Absolute: 0 10*3/uL (ref 0.0–0.7)
HCT: 33.8 % — ABNORMAL LOW (ref 36.0–46.0)
Hemoglobin: 11.1 g/dL — ABNORMAL LOW (ref 12.0–15.0)
LYMPHS ABS: 1 10*3/uL (ref 0.7–4.0)
Lymphocytes Relative: 7 % — ABNORMAL LOW (ref 12–46)
MCH: 26.7 pg (ref 26.0–34.0)
MCHC: 32.8 g/dL (ref 30.0–36.0)
MCV: 81.4 fL (ref 78.0–100.0)
Monocytes Absolute: 0.3 10*3/uL (ref 0.1–1.0)
Monocytes Relative: 2 % — ABNORMAL LOW (ref 3–12)
NEUTROS ABS: 12.8 10*3/uL — AB (ref 1.7–7.7)
NEUTROS PCT: 91 % — AB (ref 43–77)
Platelets: 224 10*3/uL (ref 150–400)
RBC: 4.15 MIL/uL (ref 3.87–5.11)
RDW: 15.6 % — AB (ref 11.5–15.5)
WBC: 14.2 10*3/uL — ABNORMAL HIGH (ref 4.0–10.5)

## 2013-12-31 LAB — COMPREHENSIVE METABOLIC PANEL
ALBUMIN: 2.6 g/dL — AB (ref 3.5–5.2)
ALK PHOS: 100 U/L (ref 39–117)
ALT: 13 U/L (ref 0–35)
AST: 12 U/L (ref 0–37)
BILIRUBIN TOTAL: 0.4 mg/dL (ref 0.3–1.2)
BUN: 92 mg/dL — AB (ref 6–23)
CHLORIDE: 97 meq/L (ref 96–112)
CO2: 18 mEq/L — ABNORMAL LOW (ref 19–32)
Calcium: 9.6 mg/dL (ref 8.4–10.5)
Creatinine, Ser: 5.11 mg/dL — ABNORMAL HIGH (ref 0.50–1.10)
GFR, EST AFRICAN AMERICAN: 9 mL/min — AB (ref >90)
GFR, EST NON AFRICAN AMERICAN: 8 mL/min — AB (ref >90)
GLUCOSE: 213 mg/dL — AB (ref 70–99)
POTASSIUM: 4.6 meq/L (ref 3.7–5.3)
Sodium: 137 mEq/L (ref 137–147)
Total Protein: 7.5 g/dL (ref 6.0–8.3)

## 2013-12-31 LAB — GLUCOSE, CAPILLARY: Glucose-Capillary: 185 mg/dL — ABNORMAL HIGH (ref 70–99)

## 2013-12-31 LAB — PROTIME-INR
INR: 4.08 — ABNORMAL HIGH (ref 0.00–1.49)
PROTHROMBIN TIME: 38 s — AB (ref 11.6–15.2)

## 2013-12-31 MED ORDER — ISOSORBIDE MONONITRATE 15 MG HALF TABLET
15.0000 mg | ORAL_TABLET | Freq: Every day | ORAL | Status: DC
  Administered 2014-01-01 – 2014-01-02 (×2): 15 mg via ORAL
  Filled 2013-12-31 (×3): qty 1

## 2013-12-31 MED ORDER — FERROUS SULFATE 325 (65 FE) MG PO TABS
325.0000 mg | ORAL_TABLET | Freq: Two times a day (BID) | ORAL | Status: DC
  Administered 2014-01-01: 325 mg via ORAL
  Filled 2013-12-31 (×3): qty 1

## 2013-12-31 MED ORDER — ONDANSETRON HCL 4 MG/2ML IJ SOLN
4.0000 mg | Freq: Four times a day (QID) | INTRAMUSCULAR | Status: DC | PRN
  Administered 2013-12-31: 4 mg via INTRAVENOUS
  Filled 2013-12-31: qty 2

## 2013-12-31 MED ORDER — METOPROLOL TARTRATE 1 MG/ML IV SOLN: 2.0000 mg | INTRAVENOUS | Status: DC | PRN

## 2013-12-31 MED ORDER — PANTOPRAZOLE SODIUM 40 MG PO TBEC
40.0000 mg | DELAYED_RELEASE_TABLET | Freq: Every day | ORAL | Status: DC
  Administered 2014-01-01 – 2014-01-02 (×2): 40 mg via ORAL
  Filled 2013-12-31 (×2): qty 1

## 2013-12-31 MED ORDER — OXYCODONE-ACETAMINOPHEN 5-325 MG PO TABS
1.0000 | ORAL_TABLET | Freq: Three times a day (TID) | ORAL | Status: DC | PRN
  Administered 2014-01-01 – 2014-01-08 (×13): 1 via ORAL
  Filled 2013-12-31 (×13): qty 1

## 2013-12-31 MED ORDER — HYDROMORPHONE HCL PF 1 MG/ML IJ SOLN
1.0000 mg | Freq: Once | INTRAMUSCULAR | Status: AC
  Administered 2013-12-31: 1 mg via INTRAVENOUS
  Filled 2013-12-31: qty 1

## 2013-12-31 MED ORDER — POTASSIUM CHLORIDE CRYS ER 20 MEQ PO TBCR
20.0000 meq | EXTENDED_RELEASE_TABLET | Freq: Once | ORAL | Status: DC
Start: 2013-12-31 — End: 2013-12-31

## 2013-12-31 MED ORDER — ACETAMINOPHEN 650 MG RE SUPP: 325.0000 mg | RECTAL | Status: DC | PRN

## 2013-12-31 MED ORDER — PHENOL 1.4 % MT LIQD: 1.0000 | OROMUCOSAL | Status: DC | PRN

## 2013-12-31 MED ORDER — INSULIN ASPART 100 UNIT/ML ~~LOC~~ SOLN: 20.0000 [IU] | Freq: Three times a day (TID) | SUBCUTANEOUS | Status: DC

## 2013-12-31 MED ORDER — OXYCODONE HCL 5 MG PO TABS
5.0000 mg | ORAL_TABLET | Freq: Three times a day (TID) | ORAL | Status: DC | PRN
  Administered 2014-01-01 – 2014-01-08 (×14): 5 mg via ORAL
  Filled 2013-12-31 (×13): qty 1

## 2013-12-31 MED ORDER — INSULIN ASPART 100 UNIT/ML ~~LOC~~ SOLN: 20.0000 [IU] | Freq: Two times a day (BID) | SUBCUTANEOUS | Status: DC

## 2013-12-31 MED ORDER — HYDROMORPHONE HCL PF 1 MG/ML IJ SOLN
0.5000 mg | INTRAMUSCULAR | Status: DC | PRN
  Administered 2014-01-01 – 2014-01-03 (×3): 1 mg via INTRAVENOUS
  Filled 2013-12-31 (×3): qty 1

## 2013-12-31 MED ORDER — FOLIC ACID 1 MG PO TABS
1.0000 mg | ORAL_TABLET | Freq: Every day | ORAL | Status: DC
  Administered 2014-01-01 – 2014-01-02 (×2): 1 mg via ORAL
  Filled 2013-12-31 (×3): qty 1

## 2013-12-31 MED ORDER — OXYCODONE-ACETAMINOPHEN 10-325 MG PO TABS: 1.0000 | ORAL_TABLET | Freq: Three times a day (TID) | ORAL | Status: DC | PRN

## 2013-12-31 MED ORDER — CARVEDILOL 12.5 MG PO TABS
12.5000 mg | ORAL_TABLET | Freq: Two times a day (BID) | ORAL | Status: DC
  Administered 2014-01-01 – 2014-01-03 (×5): 12.5 mg via ORAL
  Filled 2013-12-31 (×7): qty 1

## 2013-12-31 MED ORDER — FUROSEMIDE 80 MG PO TABS
80.0000 mg | ORAL_TABLET | Freq: Two times a day (BID) | ORAL | Status: DC
  Administered 2014-01-01 – 2014-01-02 (×4): 80 mg via ORAL
  Filled 2013-12-31 (×7): qty 1

## 2013-12-31 MED ORDER — ACETAMINOPHEN 325 MG PO TABS
325.0000 mg | ORAL_TABLET | ORAL | Status: DC | PRN
Start: 2013-12-31 — End: 2014-01-03

## 2013-12-31 MED ORDER — CLINDAMYCIN PHOSPHATE 900 MG/50ML IV SOLN
900.0000 mg | Freq: Once | INTRAVENOUS | Status: AC
  Administered 2013-12-31: 900 mg via INTRAVENOUS
  Filled 2013-12-31: qty 50

## 2013-12-31 MED ORDER — ALUM & MAG HYDROXIDE-SIMETH 200-200-20 MG/5ML PO SUSP: 15.0000 mL | ORAL | Status: DC | PRN

## 2013-12-31 MED ORDER — ACETAMINOPHEN 325 MG PO TABS: 650.0000 mg | ORAL_TABLET | Freq: Four times a day (QID) | ORAL | Status: DC | PRN

## 2013-12-31 MED ORDER — HYDRALAZINE HCL 20 MG/ML IJ SOLN: 10.0000 mg | INTRAMUSCULAR | Status: DC | PRN

## 2013-12-31 MED ORDER — GUAIFENESIN-DM 100-10 MG/5ML PO SYRP: 15.0000 mL | ORAL_SOLUTION | ORAL | Status: DC | PRN

## 2013-12-31 MED ORDER — OMEGA-3-ACID ETHYL ESTERS 1 G PO CAPS
1.0000 g | ORAL_CAPSULE | Freq: Three times a day (TID) | ORAL | Status: DC
  Administered 2014-01-01 – 2014-01-03 (×6): 1 g via ORAL
  Filled 2013-12-31 (×10): qty 1

## 2013-12-31 MED ORDER — AMLODIPINE BESYLATE 10 MG PO TABS
10.0000 mg | ORAL_TABLET | Freq: Every day | ORAL | Status: DC
  Administered 2014-01-01 – 2014-01-02 (×2): 10 mg via ORAL
  Filled 2013-12-31 (×3): qty 1

## 2013-12-31 MED ORDER — NITROGLYCERIN 0.4 MG SL SUBL: 0.4000 mg | SUBLINGUAL_TABLET | SUBLINGUAL | Status: DC | PRN

## 2013-12-31 MED ORDER — ATORVASTATIN CALCIUM 40 MG PO TABS
40.0000 mg | ORAL_TABLET | Freq: Every day | ORAL | Status: DC
  Administered 2014-01-01 – 2014-01-02 (×2): 40 mg via ORAL
  Filled 2013-12-31 (×3): qty 1

## 2013-12-31 MED ORDER — KCL IN DEXTROSE-NACL 20-5-0.45 MEQ/L-%-% IV SOLN
INTRAVENOUS | Status: DC
  Administered 2014-01-01: 50 mL via INTRAVENOUS
  Filled 2013-12-31 (×2): qty 1000

## 2013-12-31 MED ORDER — INSULIN ASPART 100 UNIT/ML ~~LOC~~ SOLN: 26.0000 [IU] | Freq: Every day | SUBCUTANEOUS | Status: DC

## 2013-12-31 MED ORDER — CALCITRIOL 0.5 MCG PO CAPS
0.5000 ug | ORAL_CAPSULE | Freq: Every day | ORAL | Status: DC
  Administered 2014-01-01 – 2014-01-02 (×2): 0.5 ug via ORAL
  Filled 2013-12-31 (×3): qty 1

## 2013-12-31 MED ORDER — LABETALOL HCL 5 MG/ML IV SOLN
10.0000 mg | INTRAVENOUS | Status: DC | PRN
  Filled 2013-12-31: qty 4

## 2013-12-31 NOTE — Progress Notes (Signed)
Subjective:     Patient ID: [name], female   DOB: [birth year], 73 y.o.   MRN: [id number]  Toe Pain    patient presents with caregiver stating that my right foot has really started to hurt me over the last few weeks and I've noted discoloration in my right big toe and my nails really bother me and I cannot cut them due to thickness and pain   Review of Systems  All other systems reviewed and are negative.       Objective:   Physical Exam  Nursing note and vitals reviewed. Constitutional: She is oriented to person, place, and time.  Neurological: She is oriented to person, place, and time.  Skin: Skin is dry.   patient is found to have significant loss of circulatory status DP and PT pulses in the right foot is cool to touch at the current time along with discoloration in the right big toe and diminished range of motion subtalar midtarsal joint both feet and diminished muscle strength noted both feet. There was no active ulceration noted but discomfort in the toe and forefoot right with shiny skin formation     Assessment:     I believe we are dealing with a vascular issue with this patient that has possibly occurred over the last month. I questioned her caregiver and she states that they do go to [facility] for treatment and I am ordering stat Doppler is on her with possibilities that she does have blockage in her right leg.    Plan:     order testing at the lab and went ahead today and debridement nailbeds 1-5 both feet which took some of the pressure off the big toe and she states it feels some better. Explained to her caregiver there is a chance that she will need amputation depending on results of vascular studies but this is the most important thing for [name] to get first to make a determination as to what will be best for her long-term

## 2013-12-31 NOTE — ED Notes (Addendum)
RLE: (by BS doppler per Dr. Peyton Najjar): (+)PT, (-)DP

## 2013-12-31 NOTE — ED Notes (Signed)
Pt alert, NAD, calm, interactive, skin W&D, resps e/u, c/o R great toe pain, Dr. Peyton Najjar in to see pt, family at [facility], IV established and blood drawn.

## 2013-12-31 NOTE — ED Notes (Signed)
Pt here from MD office to get her great toe looked at , the tip of the toe is black in color and the toe also has a large intact blister on it , the foot is painful to touch and the foot is red in color that extends up to the ankle

## 2013-12-31 NOTE — ED Notes (Signed)
Alert, NAD, calm, interactive, no changes, visitor at [facility].

## 2013-12-31 NOTE — ED Notes (Signed)
2W Unable to take report

## 2013-12-31 NOTE — Progress Notes (Signed)
Preliminary results phoned and given to valerie at Dr. Beverlee Nims office and was instructed to send patient to the emergency room.

## 2013-12-31 NOTE — ED Notes (Signed)
Transporting patient to new room assignment. 

## 2013-12-31 NOTE — Progress Notes (Signed)
ANTICOAGULATION CONSULT NOTE - Initial Consult  Pharmacy Consult for heparin Indication: Hx DVT, ischemic foot  Allergies  Allergen Reactions  . Ivp Dye [Iodinated Diagnostic Agents] Hives and Rash  . Aspirin Hives    Patient Measurements:    Vital Signs: Temp: 97.3 F (36.3 C) (03/20 1553) Temp src: Oral (03/20 1553) BP: 165/75 mmHg (03/20 2145) Pulse Rate: 80 (03/20 2145)  Labs:  Recent Labs  12/30/13 0824 12/31/13 1946  HGB 9.8* 11.1*  HCT  --  33.8*  PLT  --  224  LABPROT  --  38.0*  INR  --  4.08*  CREATININE  --  5.11*    The CrCl is unknown because both a height and weight (above a minimum accepted value) are required for this calculation.   Medical History: Past Medical History  Diagnosis Date  . CAD (coronary artery disease)   . Hypertension   . Diabetes mellitus     type 2 insulin dependent  . Hyperlipidemia   . CHF (congestive heart failure)   . Cellulitis     left foot  . Ulcer     diabetic ulcer on left foot  . DVT (deep venous thrombosis)     peroneal vein  . Peripheral vascular disease   . Chronic kidney disease   . Anemia   . Shortness of breath     with fluid  . Insomnia     Medications:  See med history  Assessment: 23 yof presented to the ED with toe pain/ischemic foot. She is on chronic coumadin for hx of DVT. Heparin IV ordered for anticoagulation however, INR is supratherapeutic at 4.08 today. H/H is slightly low but plts are WNL. No overt bleeding noted.   Goal of Therapy:  Heparin level 0.3-0.7 units/ml Monitor platelets by anticoagulation protocol: Yes   Plan:  1. Daily INR - start IV heparin when INR<2  Natalia Wittmeyer, Drake Leach 12/31/2013,10:05 PM

## 2013-12-31 NOTE — H&P (Addendum)
[facility]  Patient name: [name] MRN: [id number] DOB: 24-Sep-1941 Sex: female  REASON FOR ADMISSION: Ischemic right foot  HPI: [name] is a 73 y.o. female who underwent a left femoral to below knee popliteal artery bypass with a vein graft by Dr. Hart Rochester on 06/26/2011. She was last seen in our office in July of 2013. She was sent to the emergency department by her podiatrist who is concerned about the perfusion to her right foot.  The patient is a very poor historian and currently there is no family available to obtain a history from. However, she does state that she developed the gradual onset of pain in her right foot approximately a week ago but this has been worse over the last 3-4 days. The foot is tender to touch. She states that this did not occur suddenly. I do not get any clear-cut history of claudication although she states that her activity is limited by her shortness of breath. She is unaware of any previous nonhealing ulcers in the right foot.  She noticed some blistering on the right great toe and apparently was seen in the emergency department several days ago and started on antibiotics by mouth.  She does have a history of chronic renal insufficiency but is not on dialysis. She has a left upper arm fistula.  Past Medical History  Diagnosis Date  . CAD (coronary artery disease)   . Hypertension   . Diabetes mellitus     type 2 insulin dependent  . Hyperlipidemia   . CHF (congestive heart failure)   . Cellulitis     left foot  . Ulcer     diabetic ulcer on left foot  . DVT (deep venous thrombosis)     peroneal vein  . Peripheral vascular disease   . Chronic kidney disease   . Anemia   . Shortness of breath     with fluid  . Insomnia    Family History  Problem Relation Age of Onset  . Cancer Mother     Kidney  . Kidney disease Mother   . Cancer Father     skin   SOCIAL HISTORY: History  Substance Use Topics  .  Smoking status: Former Smoker -- 2.00 packs/day for 20 years    Types: Cigarettes    Quit date: 07/15/1996  . Smokeless tobacco: Former Neurosurgeon    Types: Snuff  . Alcohol Use: No   Allergies  Allergen Reactions  . Ivp Dye [Iodinated Diagnostic Agents] Hives and Rash  . Aspirin Hives   Current Facility-Administered Medications  Medication Dose Route Frequency Provider Last Rate Last Dose  . clindamycin (CLEOCIN) IVPB 900 mg  900 mg Intravenous Once Toney Sang, MD      . HYDROmorphone (DILAUDID) injection 1 mg  1 mg Intravenous Once Toney Sang, MD       Current Outpatient Prescriptions  Medication Sig Dispense Refill  . acetaminophen (TYLENOL) 500 MG tablet Take 1,000-1,500 mg by mouth every 6 (six) hours as needed for mild pain or headache.      [name] amLODipine (NORVASC) 5 MG tablet Take 10 mg by mouth daily.       [name] atorvastatin (LIPITOR) 40 MG tablet Take 40 mg by mouth daily.       . calcitRIOL (ROCALTROL) 0.25 MCG capsule Take 0.5 mcg by mouth daily.      . carvedilol (COREG) 12.5 MG tablet Take 12.5 mg by mouth 2 (two) times  daily with a meal.       . cephALEXin (KEFLEX) 500 MG capsule Take 500 mg by mouth 4 (four) times daily. 10 day course filled 12/30/13      . ferrous sulfate 325 (65 FE) MG tablet Take 325 mg by mouth 2 (two) times daily.       . folic acid (FOLVITE) 1 MG tablet Take 1 mg by mouth daily.       . furosemide (LASIX) 40 MG tablet Take 2 tablets (80 mg total) by mouth 2 (two) times daily.  120 tablet  1  . insulin aspart (NOVOLOG) 100 UNIT/ML injection Inject 20-26 Units into the skin 3 (three) times daily before meals. Take 20 units at breakfast and lunch. Take 26 units at dinner Sliding scale      . isosorbide mononitrate (IMDUR) 30 MG 24 hr tablet Take 15 mg by mouth daily.      . nitroGLYCERIN (NITROSTAT) 0.4 MG SL tablet Place 0.4 mg under the tongue every 5 (five) minutes as needed for chest pain.      [name]. omega-3 acid ethyl esters (LOVAZA) 1 G capsule Take 1 g  by mouth 4 (four) times daily.       [name]. omeprazole (PRILOSEC) 20 MG capsule Take 20 mg by mouth daily.       [name]. oxyCODONE-acetaminophen (PERCOCET) 10-325 MG per tablet Take 1 tablet by mouth 3 (three) times daily as needed for pain.      [name]. warfarin (COUMADIN) 4 MG tablet Take 4-6 mg by mouth daily at 6 PM. Take 1 tablet (4mg ) by mouth on Monday, Wednesday, Friday, and Saturday.  Take 1.5 tablet (6mg ) by mouth on Tuesday, Thursday, and saturday      . clindamycin (CLEOCIN) 300 MG capsule Take 300 mg by mouth 3 (three) times daily. 7 day course filled 12/23/13       REVIEW OF SYSTEMS: Arly.Keller[X ] denotes positive finding; [  ] denotes negative finding CARDIOVASCULAR:  [ ]  chest pain   [ ]  chest pressure   [ ]  palpitations   Arly.Keller[X ] orthopnea   Arly.Keller[X ] dyspnea on exertion   [ ]  claudication   [ ]  rest pain   Arly.Keller[X ] DVT   [ ]  phlebitis PULMONARY:   [ ]  productive cough   [ ]  asthma   Arly.Keller[X ] wheezing NEUROLOGIC:   [ ]  weakness  [ ]  paresthesias  [ ]  aphasia  [ ]  amaurosis  [ ]  dizziness HEMATOLOGIC:   [ ]  bleeding problems   [ ]  clotting disorders MUSCULOSKELETAL:  [ ]  joint pain   [ ]  joint swelling [ ]  leg swelling GASTROINTESTINAL: [ ]   blood in stool  [ ]   hematemesis GENITOURINARY:  [ ]   dysuria  [ ]   hematuria PSYCHIATRIC:  [ ]  history of major depression INTEGUMENTARY:  Arly.Keller[X ] rashes  [ ]  ulcers CONSTITUTIONAL:  [ ]  fever   [ ]  chills  PHYSICAL EXAM: Filed Vitals:   12/31/13 1553 12/31/13 2001  BP: 151/103 160/71  Pulse: 77 78  Temp: 97.3 F (36.3 C)   TempSrc: Oral   Resp: 17 16  SpO2: 97%    There is no weight on file to calculate BMI. GENERAL: The patient is a well-nourished female, in no acute distress. The vital signs are documented above. CARDIOVASCULAR: There is a regular rate and rhythm. I do not detect carotid bruits. She has a weakly palpable right femoral pulse with a normal left femoral pulse.  She has a palpable left popliteal pulse and brisk monophasic Doppler signals in the left foot. On  the right side she has a dampened monophasic lateral tarsal signal and a dampened monophasic posterior tibial signal with the Doppler. PULMONARY: There is good air exchange bilaterally without wheezing or rales. ABDOMEN: Soft and non-tender with normal pitched bowel sounds.  MUSCULOSKELETAL: There are no major deformities or cyanosis. NEUROLOGIC: No focal weakness or paresthesias are detected. SKIN: She has a blister on her right great toe and some mild cellulitis on the foot. PSYCHIATRIC: The patient has a normal affect. EXT: It looks like she has had endoscopic vein harvest in the right leg for her previous CABG. (She does not know).  DATA:  Lab Results  Component Value Date   WBC 14.2* 12/31/2013   HGB 11.1* 12/31/2013   HCT 33.8* 12/31/2013   MCV 81.4 12/31/2013   PLT 224 12/31/2013   Lab Results  Component Value Date   NA 137 12/31/2013   K 4.6 12/31/2013   CL 97 12/31/2013   CO2 18* 12/31/2013   Lab Results  Component Value Date   CREATININE 5.11* 12/31/2013   Lab Results  Component Value Date   INR 4.46* 12/22/2013   INR 1.10 09/07/2013   INR 2.20* 08/25/2013   Lab Results  Component Value Date   HGBA1C 11.4* 06/17/2011   I have reviewed the Doppler studies performed our office today. ABI on the right was 0%. There was a posterior tibial signal on the right with Doppler but the artery was not compressible because of calcific disease. The patient had a normal toe brachial index on the left.  Duplex of the right lower extremity showed evidence of proximal SFA disease in the distal SFA occlusion.  MEDICAL ISSUES:  CHRONIC RIGHT LOWER EXTREMITY ISCHEMIA: this patient has critical ischemia of the right foot which is likely chronic and has gradually progressed. She has evidence of multilevel arterial occlusive disease. Unfortunately she has significant chronic renal insufficiency and her creatinine tonight is 5.1. She is also on Coumadin and her last INR on 12/22/2013 was 4.46. She  could be considered for a CO2 arteriogram and we can try to cannulate above her bypass graft on the left. This is clearly a limb threatening situation but does not appear to be in acute arterial occlusion. Her history suggests progression of chronic multilevel arterial occlusive disease now with significant ischemia of the right foot. She will be gently hydrated and started on intravenous heparin. I will recheck her pro time tomorrow. We will consider CO2 arteriography.  DICKSON,CHRISTOPHER S [facility] [place] Beeper: [phone]

## 2013-12-31 NOTE — Telephone Encounter (Signed)
Judeth Cornfield gave preliminary results from pt's lower right leg arterial dopplers: right 1st toe has blister, no flow to superficial arteries, artery occlusion is distal right thigh, ABI non-compressible due to calcification.  Dr Charlsie Merles referred to the ER for treatment, orders given to [facility] as pt is still in the doppler lab.

## 2013-12-31 NOTE — ED Notes (Signed)
Pt updated, alert, NAD, calm, "feels better", repositioned in bed, propped to R with pillow under L.

## 2013-12-31 NOTE — ED Provider Notes (Signed)
CSN: [id number]     Arrival date & time 12/31/13  1535 History   First MD Initiated Contact with Patient 12/31/13 1905     Chief Complaint  Patient presents with  . Toe Pain    HPI 73 year old female with a history of coronary artery disease, hypertension, diabetes, hyperlipidemia, congestive heart failure, and peripheral vascular disease status post bypass in her left leg in 2012. She presents complaining of right foot pain. Onset of her symptoms was about a week ago. She woke up one morning and noticed that her right great toenail was dark as if she dropped something on it. She's had progressive, severe pain to the foot and toes since that time. She's had some swelling of the foot and lower leg. She started to have some redness of the top of the foot and lower leg. Pain is worse with movement. It is worse with touching the foot. It is relieved by nothing. No treatments tried. Today she was sent by her vascular surgeon for an arterial ultrasound that demonstrated occlusion proximal to the knee with no documented flow in the dorsalis pedis, some documented flow in her posterior tibialis. She does have a history of peripheral vascular disease requiring bypass and the other leg. She's not had any fever, chills, nausea, vomiting, or other symptoms.   Past Medical History  Diagnosis Date  . CAD (coronary artery disease)   . Hypertension   . Diabetes mellitus     type 2 insulin dependent  . Hyperlipidemia   . CHF (congestive heart failure)   . Cellulitis     left foot  . Ulcer     diabetic ulcer on left foot  . DVT (deep venous thrombosis)     peroneal vein  . Peripheral vascular disease   . Chronic kidney disease   . Anemia   . Shortness of breath     with fluid  . Insomnia    Past Surgical History  Procedure Laterality Date  . Tubal ligation    . Pr vein bypass graft,aorto-fem-pop  06-26-11    1. PATENT LEFT FEMORAL-POPLITEAL BYPASS GRAFT W/NO EVEIDENCE OF STENOSIS. 2.VELOCITIES OF  GREATER THAN 200 CM/'s NOTED ON PREVIOUS EXAM 10/03/11 WERE NOT ADEQUATELY VISULAIZED DURING THIS EXAM  . Coronary artery bypass graft  04/25/08    Dr. Tyrone Sage, CABG x 5   . Nm myoview ltd  01/13/13    LEXISCAN; LV WALL MOTION: LVEF 44%, INFERIOR AKINESIS, ANTEROAPICAL HYPOKINESIS  . Cardiac catheterization  04/22/08    SEVERE 3-VESSEL DISEASE CAD., CABG X 5 04/25/08 WITH DR. [id number]  . Av fistula placement Left 09/07/2013    Procedure: ARTERIOVENOUS (AV) FISTULA CREATION;  Surgeon: Sherren Kerns, MD;  Location: [facility] OR;  Service: Vascular;  Laterality: Left;   Family History  Problem Relation Age of Onset  . Cancer Mother     Kidney  . Kidney disease Mother   . Cancer Father     skin   History  Substance Use Topics  . Smoking status: Former Smoker -- 2.00 packs/day for 20 years    Types: Cigarettes    Quit date: 07/15/1996  . Smokeless tobacco: Former Neurosurgeon    Types: Snuff  . Alcohol Use: No   OB History   Grav Para Term Preterm Abortions TAB SAB Ect Mult Living                 Review of Systems  Constitutional: Negative for fever, chills and diaphoresis.  HENT: Negative  for congestion and rhinorrhea.   Respiratory: Negative for cough, shortness of breath and wheezing.   Cardiovascular: Negative for chest pain and leg swelling.  Gastrointestinal: Negative for nausea, vomiting, abdominal pain and diarrhea.  Genitourinary: Negative for dysuria, urgency, frequency, flank pain, vaginal bleeding, vaginal discharge and difficulty urinating.  Musculoskeletal: Negative for neck pain and neck stiffness.  Skin: Positive for color change, pallor and wound. Negative for rash.  Neurological: Negative for weakness, numbness and headaches.  All other systems reviewed and are negative.      Allergies  Ivp dye and Aspirin  Home Medications   No current outpatient prescriptions on file. BP 146/77  Pulse 72  Temp(Src) 97.3 F (36.3 C) (Oral)  Resp 15  Ht 5\' 3"  (1.6 m)  Wt 153  lb 14.1 oz (69.8 kg)  BMI 27.27 kg/m2  SpO2 94% Physical Exam  Constitutional: She is oriented to person, place, and time. She appears well-developed and well-nourished. No distress.  HENT:  Head: Normocephalic and atraumatic.  Mouth/Throat: Oropharynx is clear and moist.  Eyes: Conjunctivae are normal. Pupils are equal, round, and reactive to light.  Neck: Normal range of motion. Neck supple.  Cardiovascular: Normal rate, regular rhythm, normal heart sounds and intact distal pulses.  Exam reveals no gallop and no friction rub.   No murmur heard. Pulmonary/Chest: Effort normal and breath sounds normal. No respiratory distress. She has no wheezes. She has no rales.  Abdominal: Soft. She exhibits no mass. There is no tenderness. There is no rebound and no guarding.  Musculoskeletal:  Right great toe is cyanotic; there is a blister on the toe.  The dorsum of the right foot is erythematous.  The foot is slightly cool to the touch.  Tender to palpation.  2+ pitting edema of the right foot and lower leg, but symmetric with left leg.  Doppler signal detected, right PT pulse.  No palpable DP pulse on the right foot.  Pulse is not detected by doppler either.  Neurological: She is alert and oriented to person, place, and time. She displays normal reflexes. No cranial nerve deficit. She exhibits normal muscle tone. Coordination normal.  Skin: Skin is warm and dry.  Changes to right foot noted above    ED Course  Procedures (including critical care time) Labs Review Labs Reviewed  COMPREHENSIVE METABOLIC PANEL - Abnormal; Notable for the following:    CO2 18 (*)    Glucose, Bld 213 (*)    BUN 92 (*)    Creatinine, Ser 5.11 (*)    Albumin 2.6 (*)    GFR calc non Af Amer 8 (*)    GFR calc Af Amer 9 (*)    All other components within normal limits  CBC WITH DIFFERENTIAL - Abnormal; Notable for the following:    WBC 14.2 (*)    Hemoglobin 11.1 (*)    HCT 33.8 (*)    RDW 15.6 (*)    Neutrophils  Relative % 91 (*)    Neutro Abs 12.8 (*)    Lymphocytes Relative 7 (*)    Monocytes Relative 2 (*)    All other components within normal limits  PROTIME-INR - Abnormal; Notable for the following:    Prothrombin Time 38.0 (*)    INR 4.08 (*)    All other components within normal limits  PROTIME-INR   Imaging Review No results found.   EKG Interpretation None      MDM   73 year old female presents with right foot pain, cyanosis  of the right great toe, bullous lesion of the right great toe. I cannot palpate or Doppler a DP pulse. She has a dopplerable posterior tibialis pulse. Articular ultrasound performed today demonstrating arterial occlusion. I feel that she has critical ischemia although not acute. Likely subacute on chronic given the progression of her symptoms over the past week.  Consult vascular surgery. They evaluated patient in the emergency department. They started the patient on heparin. Patient is admitted to their service for further management.  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

## 2013-12-31 NOTE — ED Notes (Signed)
Admitting CV at [facility].

## 2014-01-01 ENCOUNTER — Inpatient Hospital Stay (HOSPITAL_COMMUNITY): Payer: Medicare Other

## 2014-01-01 DIAGNOSIS — N184 Chronic kidney disease, stage 4 (severe): Secondary | ICD-10-CM

## 2014-01-01 DIAGNOSIS — E1165 Type 2 diabetes mellitus with hyperglycemia: Secondary | ICD-10-CM

## 2014-01-01 DIAGNOSIS — IMO0002 Reserved for concepts with insufficient information to code with codable children: Secondary | ICD-10-CM | POA: Diagnosis present

## 2014-01-01 DIAGNOSIS — I5022 Chronic systolic (congestive) heart failure: Secondary | ICD-10-CM

## 2014-01-01 DIAGNOSIS — L98499 Non-pressure chronic ulcer of skin of other sites with unspecified severity: Secondary | ICD-10-CM

## 2014-01-01 DIAGNOSIS — I739 Peripheral vascular disease, unspecified: Secondary | ICD-10-CM

## 2014-01-01 DIAGNOSIS — E1129 Type 2 diabetes mellitus with other diabetic kidney complication: Secondary | ICD-10-CM | POA: Diagnosis present

## 2014-01-01 DIAGNOSIS — I10 Essential (primary) hypertension: Secondary | ICD-10-CM

## 2014-01-01 LAB — CBC
HEMATOCRIT: 29.1 % — AB (ref 36.0–46.0)
HEMOGLOBIN: 9.7 g/dL — AB (ref 12.0–15.0)
MCH: 27 pg (ref 26.0–34.0)
MCHC: 33.3 g/dL (ref 30.0–36.0)
MCV: 81.1 fL (ref 78.0–100.0)
Platelets: 204 10*3/uL (ref 150–400)
RBC: 3.59 MIL/uL — AB (ref 3.87–5.11)
RDW: 15.6 % — ABNORMAL HIGH (ref 11.5–15.5)
WBC: 11.9 10*3/uL — AB (ref 4.0–10.5)

## 2014-01-01 LAB — COMPREHENSIVE METABOLIC PANEL
ALBUMIN: 2.2 g/dL — AB (ref 3.5–5.2)
ALT: 11 U/L (ref 0–35)
AST: 15 U/L (ref 0–37)
Alkaline Phosphatase: 84 U/L (ref 39–117)
BILIRUBIN TOTAL: 0.3 mg/dL (ref 0.3–1.2)
BUN: 93 mg/dL — AB (ref 6–23)
CO2: 18 meq/L — AB (ref 19–32)
Calcium: 9 mg/dL (ref 8.4–10.5)
Chloride: 99 mEq/L (ref 96–112)
Creatinine, Ser: 5.62 mg/dL — ABNORMAL HIGH (ref 0.50–1.10)
GFR calc non Af Amer: 7 mL/min — ABNORMAL LOW (ref >90)
GFR, EST AFRICAN AMERICAN: 8 mL/min — AB (ref >90)
GLUCOSE: 110 mg/dL — AB (ref 70–99)
POTASSIUM: 4.5 meq/L (ref 3.7–5.3)
Sodium: 138 mEq/L (ref 137–147)
Total Protein: 6.6 g/dL (ref 6.0–8.3)

## 2014-01-01 LAB — URINE MICROSCOPIC-ADD ON

## 2014-01-01 LAB — GLUCOSE, CAPILLARY
Glucose-Capillary: 198 mg/dL — ABNORMAL HIGH (ref 70–99)
Glucose-Capillary: 185 mg/dL — ABNORMAL HIGH (ref 70–99)
Glucose-Capillary: 96 mg/dL (ref 70–99)
Glucose-Capillary: 103 mg/dL — ABNORMAL HIGH (ref 70–99)
Glucose-Capillary: 139 mg/dL — ABNORMAL HIGH (ref 70–99)

## 2014-01-01 LAB — PROTIME-INR
INR: 5.61 (ref 0.00–1.49)
INR: 4.45 — AB (ref 0.00–1.49)
Prothrombin Time: 48.5 seconds — ABNORMAL HIGH (ref 11.6–15.2)
Prothrombin Time: 40.6 seconds — ABNORMAL HIGH (ref 11.6–15.2)

## 2014-01-01 LAB — URINALYSIS, ROUTINE W REFLEX MICROSCOPIC
BILIRUBIN URINE: NEGATIVE
GLUCOSE, UA: NEGATIVE mg/dL
KETONES UR: NEGATIVE mg/dL
Nitrite: NEGATIVE
Protein, ur: >300 mg/dL — AB
Specific Gravity, Urine: 1.021 (ref 1.005–1.030)
Urobilinogen, UA: 0.2 mg/dL (ref 0.0–1.0)
pH: 5 (ref 5.0–8.0)

## 2014-01-01 LAB — PHOSPHORUS: PHOSPHORUS: 6.2 mg/dL — AB (ref 2.3–4.6)

## 2014-01-01 LAB — HEPATITIS B SURFACE ANTIGEN: Hepatitis B Surface Ag: NEGATIVE

## 2014-01-01 MED ORDER — INSULIN ASPART 100 UNIT/ML ~~LOC~~ SOLN
0.0000 [IU] | Freq: Three times a day (TID) | SUBCUTANEOUS | Status: DC
  Administered 2014-01-02 (×2): 2 [IU] via SUBCUTANEOUS
  Administered 2014-01-02: 3 [IU] via SUBCUTANEOUS
  Administered 2014-01-03 – 2014-01-04 (×2): 2 [IU] via SUBCUTANEOUS
  Administered 2014-01-04 (×2): 3 [IU] via SUBCUTANEOUS
  Administered 2014-01-10: 2 [IU] via SUBCUTANEOUS
  Administered 2014-01-11 – 2014-01-13 (×3): 3 [IU] via SUBCUTANEOUS
  Administered 2014-01-13: 2 [IU] via SUBCUTANEOUS
  Administered 2014-01-14: 8 [IU] via SUBCUTANEOUS
  Administered 2014-01-14: 3 [IU] via SUBCUTANEOUS
  Administered 2014-01-15: 8 [IU] via SUBCUTANEOUS

## 2014-01-01 MED ORDER — PIPERACILLIN-TAZOBACTAM IN DEX 2-0.25 GM/50ML IV SOLN
2.2500 g | Freq: Four times a day (QID) | INTRAVENOUS | Status: DC
  Administered 2014-01-01 – 2014-01-04 (×12): 2.25 g via INTRAVENOUS
  Filled 2014-01-01 (×15): qty 50

## 2014-01-01 MED ORDER — POLYETHYLENE GLYCOL 3350 17 G PO PACK
17.0000 g | PACK | Freq: Every day | ORAL | Status: DC
  Administered 2014-01-01 – 2014-01-08 (×5): 17 g via ORAL
  Filled 2014-01-01 (×15): qty 1

## 2014-01-01 MED ORDER — INSULIN ASPART 100 UNIT/ML ~~LOC~~ SOLN
10.0000 [IU] | Freq: Once | SUBCUTANEOUS | Status: AC
  Administered 2014-01-01: 10 [IU] via SUBCUTANEOUS

## 2014-01-01 MED ORDER — VITAMIN K1 10 MG/ML IJ SOLN
1.0000 mg | Freq: Once | INTRAVENOUS | Status: AC
  Administered 2014-01-01: 1 mg via INTRAVENOUS
  Filled 2014-01-01: qty 0.1

## 2014-01-01 MED ORDER — FERUMOXYTOL INJECTION 510 MG/17 ML
1020.0000 mg | Freq: Once | INTRAVENOUS | Status: AC
  Administered 2014-01-01: 1020 mg via INTRAVENOUS
  Filled 2014-01-01: qty 34

## 2014-01-01 NOTE — Progress Notes (Signed)
ANTIBIOTIC CONSULT NOTE - INITIAL  Pharmacy Consult for zosyn Indication: R foot cellulitis  Allergies  Allergen Reactions  . Ivp Dye [Iodinated Diagnostic Agents] Hives and Rash  . Aspirin Hives    Patient Measurements: Height: 5\' 3"  (160 cm) Weight: 153 lb 14.1 oz (69.8 kg) IBW/kg (Calculated) : 52.4   Vital Signs: Temp: 97.3 F (36.3 C) (03/21 0549) Temp src: Oral (03/21 0549) BP: 146/77 mmHg (03/21 0549) Pulse Rate: 72 (03/21 0549) Intake/Output from previous day: 03/20 0701 - 03/21 0700 In: 420 [P.O.:120; I.V.:300] Out: 100 [Urine:100] Intake/Output from this shift:    Labs:  Recent Labs  12/30/13 0824 12/31/13 1946  WBC  --  14.2*  HGB 9.8* 11.1*  PLT  --  224  CREATININE  --  5.11*   Estimated Creatinine Clearance: 9.5 ml/min (by C-G formula based on Cr of 5.11). No results found for this basename: VANCOTROUGH, VANCOPEAK, VANCORANDOM, GENTTROUGH, GENTPEAK, GENTRANDOM, TOBRATROUGH, TOBRAPEAK, TOBRARND, AMIKACINPEAK, AMIKACINTROU, AMIKACIN,  in the last 72 hours   Microbiology: No results found for this or any previous visit (from the past 720 hour(s)).  Medical History: Past Medical History  Diagnosis Date  . CAD (coronary artery disease)   . Hypertension   . Diabetes mellitus     type 2 insulin dependent  . Hyperlipidemia   . CHF (congestive heart failure)   . Cellulitis     left foot  . Ulcer     diabetic ulcer on left foot  . DVT (deep venous thrombosis)     peroneal vein  . Peripheral vascular disease   . Chronic kidney disease   . Anemia   . Shortness of breath     with fluid  . Insomnia     Medications:  Scheduled:  . amLODipine  10 mg Oral Daily  . atorvastatin  40 mg Oral Daily  . calcitRIOL  0.5 mcg Oral Daily  . carvedilol  12.5 mg Oral BID WC  . ferrous sulfate  325 mg Oral BID WC  . folic acid  1 mg Oral Daily  . furosemide  80 mg Oral BID  . insulin aspart  20 Units Subcutaneous BID WC   And  . insulin aspart  26  Units Subcutaneous QAC supper  . isosorbide mononitrate  15 mg Oral Daily  . omega-3 acid ethyl esters  1 g Oral TID AC & HS  . pantoprazole  40 mg Oral Daily   Assessment: 73 yo female with chronic RLE ischemia and here with ischemic foot.  Patient to begin zosyn for right foot cellulitis (patient was on keflex PTA).  WBC= 14.2, afeb, SCr= 5.11 and CrCl ~ 10.   3/21 zosyn  Plan:  -Zosyn 2.25gm IV q6h -Will follow renal function and clinical progress  Harland German, Pharm D 01/01/2014 8:33 AM

## 2014-01-01 NOTE — Progress Notes (Addendum)
   VASCULAR PROGRESS NOTE  SUBJECTIVE: Still with some right foot pain.   PHYSICAL EXAM:   In NSR  Filed Vitals:   12/31/13 2200 12/31/13 2230 12/31/13 2344 01/01/14 0549  BP: 155/71 153/67 174/82 146/77  Pulse: 68 67 71 72  Temp:   97.3 F (36.3 C) 97.3 F (36.3 C)  TempSrc:   Oral Oral  Resp: 11  14 15   Height:   5\' 3"  (1.6 m)   Weight:   153 lb 14.1 oz (69.8 kg) 153 lb 14.1 oz (69.8 kg)  SpO2: 96% 93% 97% 94%   No change in exam.  No right radial pulse. No right femoral pulse. Chronic ischemia of right foot.  Blister unchanged.   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 Now with limb threatening ischemia of the right foot. The situation is complicated by her CKD. Ideally would need an arteriogram to evaluate her multilevel disease, however, this would likely put her on HD. I have asked Renal to see. If she is likely to need dialysis soon anyway, then that may be an option. She does have a dye allergy and would need to be premedicated. By duplex she has occlusion of the distal SFA and tibial artery occlusive disease. There is not enough info with the duplex to know if she has a distal target for bypass. CO2 arteriography would likely not provide adequate visualization of the distal vessels on the right either, because of her multilevel disease. In addition, I am concerned that she has significant inflow disease on the right. To even further complicate the situation, she has likely had  endoscopic vein harvest from the right leg for her CABG in 2009.  * Renal to see (Her fistula was placed in Nov 2014 (09/07/13).  * F/U INR today pend.  Coumadin on hold.   * DM: HgbA1C pending  * IV Zosyn for cellulitis of right foot with elevated WBC. Currently no fever.   * Given her multiple medical issues (CAD, CHF, CKD IV, DM, HTN, COPD) I have asked for [facility] help.  Cari Caraway Beeper: [phone] 01/01/2014

## 2014-01-01 NOTE — Progress Notes (Signed)
CRITICAL VALUE ALERT  Critical value received:  INR 5.61  Date of notification:  01/01/14  Time of notification:  11:10  Critical value read back:yes  Nurse who received alert:  Lajuana Matte, RN  MD notified (1st page):  11:53   Responding MD: C. Edilia Bo, MD  Time MD responded:  11:55

## 2014-01-01 NOTE — Consult Note (Addendum)
Reason for Consult: Chronic kidney disease stage V with anticipated contrast exposure Referring Physician: Deitra Mayo M.D. (vascular surgery)  HPI:  73 year old African American woman with past medical history significant for progressive chronic kidney disease-currently stage V from underlying diabetes, hypertension and possibly congestive heart failure. She has peripheral vascular disease and presents with progressive pain and discoloration of the right foot hallux. She denies constitutional complaints including fever and chills. She admits to poor appetite and declining energy levels over the past few weeks. Denies any leg edema but has progressive worsening shortness of breath (none at rest). Denies cough or sputum production.  Concern is raised as accurate evaluation and management of her right ischemic hallux would involve exposure to iodinated contrast for better quality images-with attendant risk of CI NP/progression to ESRD. The patient has a left upper arm brachiocephalic fistula that was placed on 09/07/2013.  Past Medical History  Diagnosis Date  . CAD (coronary artery disease)   . Hypertension   . Diabetes mellitus     type 2 insulin dependent  . Hyperlipidemia   . CHF (congestive heart failure)   . Cellulitis     left foot  . Ulcer     diabetic ulcer on left foot  . DVT (deep venous thrombosis)     peroneal vein  . Peripheral vascular disease   . Chronic kidney disease   . Anemia   . Shortness of breath     with fluid  . Insomnia     Past Surgical History  Procedure Laterality Date  . Tubal ligation    . Pr vein bypass graft,aorto-fem-pop  06-26-11    1. PATENT LEFT FEMORAL-POPLITEAL BYPASS GRAFT W/NO EVEIDENCE OF STENOSIS. 2.VELOCITIES OF GREATER THAN 200 CM/'s NOTED ON PREVIOUS EXAM 10/03/11 WERE NOT ADEQUATELY VISULAIZED DURING THIS EXAM  . Coronary artery bypass graft  04/25/08    Dr. Servando Snare, CABG x 5   . Nm myoview ltd  01/13/13    LEXISCAN; LV WALL  MOTION: LVEF 44%, INFERIOR AKINESIS, ANTEROAPICAL HYPOKINESIS  . Cardiac catheterization  04/22/08    SEVERE 3-VESSEL DISEASE CAD., CABG X 5 04/25/08 WITH DR. [id number]  . Av fistula placement Left 09/07/2013    Procedure: ARTERIOVENOUS (AV) FISTULA CREATION;  Surgeon: Elam Dutch, MD;  Location: [facility] OR;  Service: Vascular;  Laterality: Left;    Family History  Problem Relation Age of Onset  . Cancer Mother     Kidney  . Kidney disease Mother   . Cancer Father     skin    Social History:  reports that she quit smoking about 17 years ago. Her smoking use included Cigarettes. She has a 40 pack-year smoking history. She has quit using smokeless tobacco. Her smokeless tobacco use included Snuff. She reports that she does not drink alcohol or use illicit drugs.  Allergies:  Allergies  Allergen Reactions  . Ivp Dye [Iodinated Diagnostic Agents] Hives and Rash  . Aspirin Hives    Medications:  Scheduled: . amLODipine  10 mg Oral Daily  . atorvastatin  40 mg Oral Daily  . calcitRIOL  0.5 mcg Oral Daily  . carvedilol  12.5 mg Oral BID WC  . ferrous sulfate  325 mg Oral BID WC  . folic acid  1 mg Oral Daily  . furosemide  80 mg Oral BID  . insulin aspart  0-15 Units Subcutaneous TID WC  . isosorbide mononitrate  15 mg Oral Daily  . omega-3 acid ethyl esters  1 g Oral  TID AC & HS  . pantoprazole  40 mg Oral Daily  . piperacillin-tazobactam (ZOSYN)  IV  2.25 g Intravenous Q6H    Results for orders placed during the hospital encounter of 12/31/13 (from the past 48 hour(s))  COMPREHENSIVE METABOLIC PANEL     Status: Abnormal   Collection Time    12/31/13  7:46 PM      Result Value Ref Range   Sodium 137  137 - 147 mEq/L   Potassium 4.6  3.7 - 5.3 mEq/L   Chloride 97  96 - 112 mEq/L   CO2 18 (*) 19 - 32 mEq/L   Glucose, Bld 213 (*) 70 - 99 mg/dL   BUN 92 (*) 6 - 23 mg/dL   Creatinine, Ser 5.11 (*) 0.50 - 1.10 mg/dL   Calcium 9.6  8.4 - 10.5 mg/dL   Total Protein 7.5  6.0 - 8.3  g/dL   Albumin 2.6 (*) 3.5 - 5.2 g/dL   AST 12  0 - 37 U/L   ALT 13  0 - 35 U/L   Alkaline Phosphatase 100  39 - 117 U/L   Total Bilirubin 0.4  0.3 - 1.2 mg/dL   GFR calc non Af Amer 8 (*) >90 mL/min   GFR calc Af Amer 9 (*) >90 mL/min   Comment: (NOTE)     The eGFR has been calculated using the CKD EPI equation.     This calculation has not been validated in all clinical situations.     eGFR's persistently <90 mL/min signify possible Chronic Kidney     Disease.  CBC WITH DIFFERENTIAL     Status: Abnormal   Collection Time    12/31/13  7:46 PM      Result Value Ref Range   WBC 14.2 (*) 4.0 - 10.5 K/uL   RBC 4.15  3.87 - 5.11 MIL/uL   Hemoglobin 11.1 (*) 12.0 - 15.0 g/dL   HCT 33.8 (*) 36.0 - 46.0 %   MCV 81.4  78.0 - 100.0 fL   MCH 26.7  26.0 - 34.0 pg   MCHC 32.8  30.0 - 36.0 g/dL   RDW 15.6 (*) 11.5 - 15.5 %   Platelets 224  150 - 400 K/uL   Neutrophils Relative % 91 (*) 43 - 77 %   Neutro Abs 12.8 (*) 1.7 - 7.7 K/uL   Lymphocytes Relative 7 (*) 12 - 46 %   Lymphs Abs 1.0  0.7 - 4.0 K/uL   Monocytes Relative 2 (*) 3 - 12 %   Monocytes Absolute 0.3  0.1 - 1.0 K/uL   Eosinophils Relative 0  0 - 5 %   Eosinophils Absolute 0.0  0.0 - 0.7 K/uL   Basophils Relative 0  0 - 1 %   Basophils Absolute 0.0  0.0 - 0.1 K/uL  PROTIME-INR     Status: Abnormal   Collection Time    12/31/13  7:46 PM      Result Value Ref Range   Prothrombin Time 38.0 (*) 11.6 - 15.2 seconds   INR 4.08 (*) 0.00 - 1.49  GLUCOSE, CAPILLARY     Status: Abnormal   Collection Time    12/31/13 11:34 PM      Result Value Ref Range   Glucose-Capillary 185 (*) 70 - 99 mg/dL  GLUCOSE, CAPILLARY     Status: Abnormal   Collection Time    01/01/14  6:21 AM      Result Value Ref Range   Glucose-Capillary  198 (*) 70 - 99 mg/dL  GLUCOSE, CAPILLARY     Status: Abnormal   Collection Time    01/01/14  8:35 AM      Result Value Ref Range   Glucose-Capillary 185 (*) 70 - 99 mg/dL  PROTIME-INR     Status: Abnormal    Collection Time    01/01/14  9:55 AM      Result Value Ref Range   Prothrombin Time 48.5 (*) 11.6 - 15.2 seconds   INR 5.61 (*) 0.00 - 1.49   Comment: CRITICAL RESULT CALLED TO, READ BACK BY AND VERIFIED WITH:     DEENA JACOBS RN AT 2094 01/01/14 BY WOOLLENK    No results found.  Review of Systems  Constitutional: Negative.   HENT: Negative.   Eyes: Negative.   Respiratory:       Short of breath with activity-no cough or wheezing  Cardiovascular:       Complaints of exertional dyspnea  Gastrointestinal: Negative.        Reports recent decline in her appetite  Genitourinary: Negative.   Musculoskeletal:       Focal right foot pain  Skin: Negative.   Neurological: Negative.   Endo/Heme/Allergies: Negative.   Psychiatric/Behavioral: Negative.    Blood pressure 146/77, pulse 72, temperature 97.3 F (36.3 C), temperature source Oral, resp. rate 15, height 5' 3"  (1.6 m), weight 69.8 kg (153 lb 14.1 oz), SpO2 94.00%. Physical Exam  Nursing note and vitals reviewed. Constitutional: She is oriented to person, place, and time. She appears well-developed and well-nourished. No distress.  HENT:  Head: Normocephalic and atraumatic.  Left Ear: External ear normal.  Nose: Nose normal.  Eyes: EOM are normal. Pupils are equal, round, and reactive to light. No scleral icterus.  Neck: Normal range of motion. JVD present. No tracheal deviation present. No thyromegaly present.  10cm JVD  Cardiovascular: Normal rate and regular rhythm.  Exam reveals no friction rub.   Respiratory: Effort normal. No respiratory distress. She has no wheezes. She has rales. She exhibits no tenderness.  Fine bibasal rales  GI: Soft. Bowel sounds are normal. She exhibits no distension. There is no tenderness. There is no rebound and no guarding.  Musculoskeletal: Normal range of motion. She exhibits no edema and no tenderness.  Ischemic right hallux  Neurological: She is alert and oriented to person, place,  and time. No cranial nerve deficit. Coordination normal.  Oriented to place, person and time  Skin: Skin is warm and dry. No erythema. No pallor.  Psychiatric: She has a normal mood and affect.    Assessment/Plan: 1. Chronic kidney disease stage V with anticipated contrast exposure: Based on predictive models, she has a high chance of progression to ESRD and maintenance hemodialysis. She recognizes this risk and understands that it is inevitable to try and best understand the nature of her ischemic toe so as to best manage it. Given her progressive shortness of breath/history of congestive heart failure-I doubt she would be able to withstand "contrast prophylaxis" with fluids. The timing of dialysis at this point is unclear however-preferably to follow her angiogram. I will check a chest x-ray today and sent for hepatitis serologies. 2. Right hallux ischemia: Ongoing pain control at this time and awaiting clarification of goals prior to contrast exposure. I had discussed earlier with Dr. Scot Dock and he informed me of the limited images/quality with CO2 angiography. On broad-spectrum antibiotic therapy with Zosyn. 3. Anemia: Hemoglobin currently at goal but low iron saturations noted-we'll  give intravenous iron 4. Hypertension: Resumed on oral antihypertensive therapy-likely to be aggravated by pain/fluids. 5. Metabolic bone disease: On calcitriol, follow up on phosphorus levels to determine need for binder  Houda Brau K. 01/01/2014, 11:02 AM

## 2014-01-01 NOTE — Consult Note (Signed)
[facility] History and Physical  [name] VAP:[id number] DOB: Mar 03, 1941 DOA: 12/31/2013  Referring physician: Chuck Hint, MD PCP: Michiel Sites, MD   Chief Complaint: Consult for Medical management  HPI: [name] is a 73 y.o. African American female with multiple medical problems including CKD stage IV, PVD, diabetes and chronic CHF. Patient came in to the hospital because of right hallux discoloration. Patient admitted under the vascular surgery team, with mottled, cold, painful and tender right hallux, per Dr. Edilia Bo patient has chronic multilevel arterial occlusive disease which has progressed significantly. [facility] consulted for medical management of multiple medical problems.  Review of Systems:  Constitutional: negative for anorexia, fevers and sweats Eyes: negative for irritation, redness and visual disturbance Ears, nose, mouth, throat, and face: negative for earaches, epistaxis, nasal congestion and sore throat Respiratory: negative for cough, dyspnea on exertion, sputum and wheezing Cardiovascular: negative for chest pain, dyspnea, lower extremity edema, orthopnea, palpitations and syncope Gastrointestinal: negative for abdominal pain, constipation, diarrhea, melena, nausea and vomiting Genitourinary:negative for dysuria, frequency and hematuria Hematologic/lymphatic: negative for bleeding, easy bruising and lymphadenopathy Musculoskeletal: pain in the right hallux Neurological: negative for coordination problems, gait problems, headaches and weakness Endocrine: negative for diabetic symptoms including polydipsia, polyuria and weight loss Allergic/Immunologic: negative for anaphylaxis, hay fever and urticaria  Past Medical History  Diagnosis Date  . CAD (coronary artery disease)   . Hypertension   . Diabetes mellitus     type 2 insulin dependent  . Hyperlipidemia   . CHF (congestive heart failure)   . Cellulitis     left foot  .  Ulcer     diabetic ulcer on left foot  . DVT (deep venous thrombosis)     peroneal vein  . Peripheral vascular disease   . Chronic kidney disease   . Anemia   . Shortness of breath     with fluid  . Insomnia    Past Surgical History  Procedure Laterality Date  . Tubal ligation    . Pr vein bypass graft,aorto-fem-pop  06-26-11    1. PATENT LEFT FEMORAL-POPLITEAL BYPASS GRAFT W/NO EVEIDENCE OF STENOSIS. 2.VELOCITIES OF GREATER THAN 200 CM/'s NOTED ON PREVIOUS EXAM 10/03/11 WERE NOT ADEQUATELY VISULAIZED DURING THIS EXAM  . Coronary artery bypass graft  04/25/08    Dr. Tyrone Sage, CABG x 5   . Nm myoview ltd  01/13/13    LEXISCAN; LV WALL MOTION: LVEF 44%, INFERIOR AKINESIS, ANTEROAPICAL HYPOKINESIS  . Cardiac catheterization  04/22/08    SEVERE 3-VESSEL DISEASE CAD., CABG X 5 04/25/08 WITH DR. HYHOOILN  . Av fistula placement Left 09/07/2013    Procedure: ARTERIOVENOUS (AV) FISTULA CREATION;  Surgeon: Sherren Kerns, MD;  Location: [facility] OR;  Service: Vascular;  Laterality: Left;   Social History:   reports that she quit smoking about 17 years ago. Her smoking use included Cigarettes. She has a 40 pack-year smoking history. She has quit using smokeless tobacco. Her smokeless tobacco use included Snuff. She reports that she does not drink alcohol or use illicit drugs.  Allergies  Allergen Reactions  . Ivp Dye [Iodinated Diagnostic Agents] Hives and Rash  . Aspirin Hives    Family History  Problem Relation Age of Onset  . Cancer Mother     Kidney  . Kidney disease Mother   . Cancer Father     skin     Prior to Admission medications   Medication Sig Start Date End Date Taking? Authorizing Provider  acetaminophen (  TYLENOL) 500 MG tablet Take 1,000-1,500 mg by mouth every 6 (six) hours as needed for mild pain or headache.   Yes Historical Provider, MD  amLODipine (NORVASC) 5 MG tablet Take 10 mg by mouth daily.    Yes Historical Provider, MD  atorvastatin (LIPITOR) 40 MG tablet Take 40  mg by mouth daily.    Yes Historical Provider, MD  calcitRIOL (ROCALTROL) 0.25 MCG capsule Take 0.5 mcg by mouth daily.   Yes Historical Provider, MD  carvedilol (COREG) 12.5 MG tablet Take 12.5 mg by mouth 2 (two) times daily with a meal.    Yes Historical Provider, MD  cephALEXin (KEFLEX) 500 MG capsule Take 500 mg by mouth 4 (four) times daily. 10 day course filled 12/30/13   Yes Historical Provider, MD  ferrous sulfate 325 (65 FE) MG tablet Take 325 mg by mouth 2 (two) times daily.    Yes Historical Provider, MD  folic acid (FOLVITE) 1 MG tablet Take 1 mg by mouth daily.    Yes Historical Provider, MD  furosemide (LASIX) 40 MG tablet Take 2 tablets (80 mg total) by mouth 2 (two) times daily. 08/25/13  Yes Costin Otelia SergeantM Gherghe, MD  insulin aspart (NOVOLOG) 100 UNIT/ML injection Inject 20-26 Units into the skin 3 (three) times daily before meals. Take 20 units at breakfast and lunch. Take 26 units at dinner Sliding scale   Yes Historical Provider, MD  isosorbide mononitrate (IMDUR) 30 MG 24 hr tablet Take 15 mg by mouth daily.   Yes Historical Provider, MD  nitroGLYCERIN (NITROSTAT) 0.4 MG SL tablet Place 0.4 mg under the tongue every 5 (five) minutes as needed for chest pain.   Yes Historical Provider, MD  omega-3 acid ethyl esters (LOVAZA) 1 G capsule Take 1 g by mouth 4 (four) times daily.    Yes Historical Provider, MD  omeprazole (PRILOSEC) 20 MG capsule Take 20 mg by mouth daily.    Yes Historical Provider, MD  oxyCODONE-acetaminophen (PERCOCET) 10-325 MG per tablet Take 1 tablet by mouth 3 (three) times daily as needed for pain.   Yes Historical Provider, MD  warfarin (COUMADIN) 4 MG tablet Take 4-6 mg by mouth daily at 6 PM. Take 1 tablet (4mg ) by mouth on Monday, Wednesday, Friday, and Saturday.  Take 1.5 tablet (6mg ) by mouth on Tuesday, Thursday, and saturday   Yes Historical Provider, MD  clindamycin (CLEOCIN) 300 MG capsule Take 300 mg by mouth 3 (three) times daily. 7 day course filled  12/23/13    Historical Provider, MD   Physical Exam: Filed Vitals:   01/01/14 0549  BP: 146/77  Pulse: 72  Temp: 97.3 F (36.3 C)  Resp: 15   Constitutional: Oriented to person, place, and time. Well-developed and well-nourished. Cooperative.  Head: Normocephalic and atraumatic.  Nose: Nose normal.  Mouth/Throat: Uvula is midline, oropharynx is clear and moist and mucous membranes are normal.  Eyes: Conjunctivae and EOM are normal. Pupils are equal, round, and reactive to light.  Neck: Trachea normal and normal range of motion. Neck supple.  Cardiovascular: Normal rate, regular rhythm, S1 normal, S2 normal, normal heart sounds and intact distal pulses.   Pulmonary/Chest: Effort normal and breath sounds normal.  Abdominal: Soft. Bowel sounds are normal. There is no hepatosplenomegaly. There is no tenderness.  Musculoskeletal: Right lower extremity is cold, tender and dark colored right great toe.  Neurological: Alert and oriented to person, place, and time. Has normal strength. No cranial nerve deficit or sensory deficit.  Skin: Skin is  warm, dry and intact.  Psychiatric: Has a normal mood and affect. Speech is normal and behavior is normal.   Labs on Admission:  Basic Metabolic Panel:  Recent Labs Lab 12/31/13 1946  NA 137  K 4.6  CL 97  CO2 18*  GLUCOSE 213*  BUN 92*  CREATININE 5.11*  CALCIUM 9.6   Liver Function Tests:  Recent Labs Lab 12/31/13 1946  AST 12  ALT 13  ALKPHOS 100  BILITOT 0.4  PROT 7.5  ALBUMIN 2.6*   No results found for this basename: LIPASE, AMYLASE,  in the last 168 hours No results found for this basename: AMMONIA,  in the last 168 hours CBC:  Recent Labs Lab 12/30/13 0824 12/31/13 1946  WBC  --  14.2*  NEUTROABS  --  12.8*  HGB 9.8* 11.1*  HCT  --  33.8*  MCV  --  81.4  PLT  --  224   Cardiac Enzymes: No results found for this basename: CKTOTAL, CKMB, CKMBINDEX, TROPONINI,  in the last 168 hours  BNP (last 3  results)  Recent Labs  08/17/13 1603 12/22/13 1809  PROBNP 28521.0* >70000.0*   CBG:  Recent Labs Lab 12/31/13 2334 01/01/14 0621 01/01/14 0835 01/01/14 1151  GLUCAP 185* 198* 185* 96    Radiological Exams on Admission: No results found.  EKG: Independently reviewed.   Assessment/Plan Principal Problem:   Atherosclerotic PVD with ulceration Active Problems:   Chronic systolic heart failure   CKD (chronic kidney disease), stage IV   COPD (chronic obstructive pulmonary disease)   Essential hypertension   Renal failure, acute on chronic stage 4    Atherosclerotic PVD with limb threatening ischemia -Management by Dr. Edilia Bo of vascular surgery. -Efforts to salvage the right lower extremity with arteriogram. -Complex situation as CO2 angiography will not provide adequate visualization, patient has CKD stage IV. -Iodine-based dye might progress her renal disease to ESRD, renal consulted.  CKD stage IV -Patient has dialysis graft placed already for anticipated dialysis in the near future. -Renal consulted.  Diabetes mellitus type 2 -Check hemoglobin A1c, started on insulin sliding scale. -Carbohydrate modified diet.  Chronic systolic CHF -No recent echo but LexiScan stress test on April 2014 showed LVEF of 44%. -Patient has had inferior and anterior apical hypokinesis. -Check EKG, I will check 2-D echo anticipating she might need surgery. -Trace pedal edema, check chest x-ray and continue oral Lasix.  Hypertension -Continue home medications including Imdur, amlodipine, carvedilol and Lasix.  History of DVT -On Coumadin, came in with super therapeutic INR of 5.6.  Time spent: 70 minutes  [facility] A [facility] Pager [phone]

## 2014-01-02 DIAGNOSIS — D649 Anemia, unspecified: Secondary | ICD-10-CM

## 2014-01-02 DIAGNOSIS — I509 Heart failure, unspecified: Secondary | ICD-10-CM

## 2014-01-02 DIAGNOSIS — I059 Rheumatic mitral valve disease, unspecified: Secondary | ICD-10-CM

## 2014-01-02 DIAGNOSIS — R9439 Abnormal result of other cardiovascular function study: Secondary | ICD-10-CM

## 2014-01-02 DIAGNOSIS — I5023 Acute on chronic systolic (congestive) heart failure: Secondary | ICD-10-CM

## 2014-01-02 LAB — GLUCOSE, CAPILLARY
GLUCOSE-CAPILLARY: 136 mg/dL — AB (ref 70–99)
GLUCOSE-CAPILLARY: 151 mg/dL — AB (ref 70–99)
Glucose-Capillary: 132 mg/dL — ABNORMAL HIGH (ref 70–99)
Glucose-Capillary: 108 mg/dL — ABNORMAL HIGH (ref 70–99)

## 2014-01-02 LAB — RENAL FUNCTION PANEL
ALBUMIN: 2.1 g/dL — AB (ref 3.5–5.2)
BUN: 95 mg/dL — ABNORMAL HIGH (ref 6–23)
CALCIUM: 9.1 mg/dL (ref 8.4–10.5)
CO2: 11 mEq/L — ABNORMAL LOW (ref 19–32)
Chloride: 96 mEq/L (ref 96–112)
Creatinine, Ser: 5.64 mg/dL — ABNORMAL HIGH (ref 0.50–1.10)
GFR calc non Af Amer: 7 mL/min — ABNORMAL LOW (ref >90)
GFR, EST AFRICAN AMERICAN: 8 mL/min — AB (ref >90)
Glucose, Bld: 138 mg/dL — ABNORMAL HIGH (ref 70–99)
PHOSPHORUS: 6.9 mg/dL — AB (ref 2.3–4.6)
Potassium: 4.8 mEq/L (ref 3.7–5.3)
Sodium: 134 mEq/L — ABNORMAL LOW (ref 137–147)

## 2014-01-02 LAB — HEMOGLOBIN A1C
Hgb A1c MFr Bld: 8.1 % — ABNORMAL HIGH (ref <5.7)
Mean Plasma Glucose: 186 mg/dL — ABNORMAL HIGH (ref <117)

## 2014-01-02 LAB — PROTIME-INR
INR: 1.55 — ABNORMAL HIGH (ref 0.00–1.49)
Prothrombin Time: 18.2 seconds — ABNORMAL HIGH (ref 11.6–15.2)

## 2014-01-02 MED ORDER — LANTHANUM CARBONATE 500 MG PO CHEW
500.0000 mg | CHEWABLE_TABLET | Freq: Three times a day (TID) | ORAL | Status: DC
  Administered 2014-01-02 – 2014-01-15 (×20): 500 mg via ORAL
  Filled 2014-01-02 (×43): qty 1

## 2014-01-02 MED ORDER — VITAMIN K1 10 MG/ML IJ SOLN
2.0000 mg | Freq: Once | INTRAVENOUS | Status: AC
  Administered 2014-01-02: 2 mg via INTRAVENOUS
  Filled 2014-01-02: qty 0.2

## 2014-01-02 MED ORDER — HEPARIN (PORCINE) IN NACL 100-0.45 UNIT/ML-% IJ SOLN
1000.0000 [IU]/h | INTRAMUSCULAR | Status: DC
  Administered 2014-01-02: 1000 [IU]/h via INTRAVENOUS
  Filled 2014-01-02 (×3): qty 250

## 2014-01-02 NOTE — Progress Notes (Signed)
  Echocardiogram 2D Echocardiogram has been performed.  [name] 01/02/2014, 11:35 AM

## 2014-01-02 NOTE — Progress Notes (Signed)
Patient ID: [name], female   DOB: 07-04-1941, 73 y.o.   MRN: [id number]  [facility] PROGRESS NOTE  [name]:[id number] DOB: 07-13-1941 DOA: 12/31/2013 PCP: Rhonda Sites, MD  Brief narrative: Pt is 73 yo AAF with multiple, complex medical problems including CKD stage V from uncontrolled diabetes and HTN , PVD, presented to [name] March 20th, with main concern of progressively worsening discoloration of the right foot hallux associated with throbbing and constant pain, 10/10 in severity, worse with touching, cold to touch, no specific alleviating factors. No reported fevers on admission, no chest pain or shortness of breath. [facility] consulted for assistance with chronic medical conditions. Main concern raised is management of the right ischemic hallux as iodinated contrast is required, risk being progression to ESRD and requiring HD. The patient has a left upper arm brachiocephalic fistula placed 09/07/2013.   Principal Problem:   Atherosclerotic PVD with ulceration - management per primary team  - plan is to proceed with arteriography, risks of progression to ESRD discussed with pt and she is willing to proceed  Active Problems:   Cellulitis of the right foot - continue Zosyn day #2   Chronic systolic heart failure - per 2 D ECHO 3/22 --> EF 30 - 35 % - continue Lasix IV BID - weight today is 153 lbs    CKD (chronic kidney disease), stage IV - appreciate nephrology following  - continue to monitor renal function    COPD (chronic obstructive pulmonary disease) - clinically compensated, maintaining oxygen saturations at target range   Essential hypertension - BP slightly above target range - continue Imdur, Coreg, Amlodipine    Anemia of chronic disease - no signs of active bleeding - CBC in AM   DM type 2, uncontrolled, with renal complications - reasonable inpatient control, CBG's in 130's over the past 24 hours  - A1C (3/21) 8.1 - continue SSI while inpatient   Leukocytosis - secondary to right hallux ischemia and cellulitis  - Zosyn day #2 as noted above and repeat CBC in AM   HLD - continue statin as per home medical regimen    Moderate malnutrition  - albumin ~2, secondary to progressive nature of the complex medical conditions outlined above  Procedures/Studies: CXR 01/01/2014  Congestive heart failure pattern which is mildly worsened from prior. Antibiotics:  Zosyn 3/21 -->   Code Status: Full Family Communication: Pt at bedside Disposition Plan: Home when medically stable  HPI/Subjective: No events overnight.   Objective: Filed Vitals:   01/01/14 0549 01/01/14 1541 01/01/14 2021 01/02/14 0420  BP: 146/77 156/49 150/73 146/60  Pulse: 72 69 69 69  Temp: 97.3 F (36.3 C) 97.9 F (36.6 C) 98.4 F (36.9 C) 98.1 F (36.7 C)  TempSrc: Oral Oral Oral Oral  Resp: 15 17 17 18   Height:      Weight: 69.8 kg (153 lb 14.1 oz)     SpO2: 94% 97% 95% 97%    Intake/Output Summary (Last 24 hours) at 01/02/14 1445 Last data filed at 01/02/14 1300  Gross per 24 hour  Intake    440 ml  Output    200 ml  Net    240 ml    Exam:   General:  Pt is alert, follows commands appropriately, not in acute distress  Cardiovascular: Regular rate and rhythm, S1/S2, no murmurs, no rubs, no gallops  Respiratory: Clear to auscultation bilaterally, no wheezing, no crackles, no rhonchi  Abdomen: Soft, non tender, non distended, bowel  sounds present, no guarding  Data Reviewed: Basic Metabolic Panel:  Recent Labs Lab 12/31/13 1946 01/01/14 1522  NA 137 138  K 4.6 4.5  CL 97 99  CO2 18* 18*  GLUCOSE 213* 110*  BUN 92* 93*  CREATININE 5.11* 5.62*  CALCIUM 9.6 9.0  PHOS  --  6.2*   Liver Function Tests:  Recent Labs Lab 12/31/13 1946 01/01/14 1522  AST 12 15  ALT 13 11  ALKPHOS 100 84  BILITOT 0.4 0.3  PROT 7.5 6.6  ALBUMIN 2.6* 2.2*   CBC:  Recent Labs Lab 12/30/13 0824 12/31/13 1946 01/01/14 1522  WBC  --  14.2* 11.9*   NEUTROABS  --  12.8*  --   HGB 9.8* 11.1* 9.7*  HCT  --  33.8* 29.1*  MCV  --  81.4 81.1  PLT  --  224 204   CBG:  Recent Labs Lab 01/01/14 1151 01/01/14 1640 01/01/14 2122 01/02/14 0656 01/02/14 1134  GLUCAP 96 103* 139* 132* 136*   Scheduled Meds: . amLODipine  10 mg Oral Daily  . atorvastatin  40 mg Oral Daily  . calcitRIOL  0.5 mcg Oral Daily  . carvedilol  12.5 mg Oral BID WC  . folic acid  1 mg Oral Daily  . furosemide  80 mg Oral BID  . insulin aspart  0-15 Units Subcutaneous TID WC  . isosorbide mononitrate  15 mg Oral Daily  . lanthanum  500 mg Oral TID WC  . pantoprazole  40 mg Oral Daily  . ZOSYN  IV  2.25 g Intravenous Q6H   Continuous Infusions:   [name], [name], MD  [facility] Pager [phone]  If 7PM-7AM, please contact night-coverage [email or web address] Password [email or web address] 01/02/2014, 2:45 PM   LOS: 2 days

## 2014-01-02 NOTE — Progress Notes (Signed)
ANTICOAGULATION CONSULT NOTE - Follow Up Consult  Pharmacy Consult for Heparin when INR < 2 Indication: Ischemic Foot  Allergies  Allergen Reactions  . Ivp Dye [Iodinated Diagnostic Agents] Hives and Rash  . Aspirin Hives    Patient Measurements: Height: 5\' 3"  (160 cm) Weight: 153 lb 14.1 oz (69.8 kg) IBW/kg (Calculated) : 52.4 Heparin Dosing Weight:   Vital Signs: Temp: 97.6 F (36.4 C) (03/22 1454) Temp src: Oral (03/22 1454) BP: 152/56 mmHg (03/22 1806) Pulse Rate: 58 (03/22 1806)  Labs:  Recent Labs  12/31/13 1946 01/01/14 0955 01/01/14 1522 01/01/14 2200 01/02/14 1903  HGB 11.1*  --  9.7*  --   --   HCT 33.8*  --  29.1*  --   --   PLT 224  --  204  --   --   LABPROT 38.0* 48.5*  --  40.6* 18.2*  INR 4.08* 5.61*  --  4.45* 1.55*  CREATININE 5.11*  --  5.62*  --   --     Estimated Creatinine Clearance: 8.6 ml/min (by C-G formula based on Cr of 5.62).   Medications:  Scheduled:  . amLODipine  10 mg Oral Daily  . atorvastatin  40 mg Oral Daily  . calcitRIOL  0.5 mcg Oral Daily  . carvedilol  12.5 mg Oral BID WC  . folic acid  1 mg Oral Daily  . furosemide  80 mg Oral BID  . insulin aspart  0-15 Units Subcutaneous TID WC  . isosorbide mononitrate  15 mg Oral Daily  . lanthanum  500 mg Oral TID WC  . omega-3 acid ethyl esters  1 g Oral TID AC & HS  . pantoprazole  40 mg Oral Daily  . piperacillin-tazobactam (ZOSYN)  IV  2.25 g Intravenous Q6H  . polyethylene glycol  17 g Oral Daily    Assessment: 73yo female admitted with supratherapeutic INR, now s/p Vit K 1mg  IV and Vit K 2mg  IV with INR down to 1.55 this evening.  OK to start Heparin.    Goal of Therapy:  Heparin level 0.3-0.7 units/ml Monitor platelets by anticoagulation protocol: Yes   Plan:  1-  Heparin 1000 units/hr, no bolus 2-  Heparin level 8hr 3-  Daily HL, CBC  Marisue Humble, PharmD Clinical Pharmacist [facility]

## 2014-01-02 NOTE — Progress Notes (Signed)
   VASCULAR PROGRESS NOTE  SUBJECTIVE: Right foot feels a little better.  PHYSICAL EXAM: Filed Vitals:   01/01/14 0549 01/01/14 1541 01/01/14 2021 01/02/14 0420  BP: 146/77 156/49 150/73 146/60  Pulse: 72 69 69 69  Temp: 97.3 F (36.3 C) 97.9 F (36.6 C) 98.4 F (36.9 C) 98.1 F (36.7 C)  TempSrc: Oral Oral Oral Oral  Resp: 15 17 17 18   Height:      Weight: 153 lb 14.1 oz (69.8 kg)     SpO2: 94% 97% 95% 97%   No change in right foot. I am unable to palpate a right femoral pulse. She does have a left femoral pulse.  LABS: Lab Results  Component Value Date   WBC 11.9* 01/01/2014   HGB 9.7* 01/01/2014   HCT 29.1* 01/01/2014   MCV 81.1 01/01/2014   PLT 204 01/01/2014   Lab Results  Component Value Date   CREATININE 5.62* 01/01/2014   Lab Results  Component Value Date   INR 4.45* 01/01/2014   CBG (last 3)   Recent Labs  01/01/14 1640 01/01/14 2122 01/02/14 0656  GLUCAP 103* 139* 132*    Principal Problem:   Atherosclerotic PVD with ulceration Active Problems:   Chronic systolic heart failure   CKD (chronic kidney disease), stage IV   COPD (chronic obstructive pulmonary disease)   Essential hypertension   DM type 2, uncontrolled, with renal complications   ASSESSMENT AND PLAN:  * Appreciate Dr. Eliane Decree input. The only chance for limb salvage is to proceed with arteriography using some contrast. She has stage V chronic kidney disease and it is felt that there is a 50-60% chance of progression to dialysis with contrast exposure. Patient understands this risk and is willing to proceed. I'll plan on proceeding with the arteriogram tomorrow to evaluate her options for revascularization of the right lower extremity. If we see iliac disease amenable to angioplasty this could potentially be addressed at the same time. I have reviewed with the patient the indications for arteriography. In addition, I have reviewed the potential complications of arteriography including but  not limited to: Bleeding, arterial injury, arterial thrombosis, dye action, renal insufficiency, or other unpredictable medical problems. I have explained to the patient that if we find disease amenable to angioplasty we could potentially address this at the same time. I have discussed the potential complications of angioplasty and stenting, including but not limited to: Bleeding, arterial thrombosis, arterial injury, dissection, or the need for surgical intervention.  * Day 2 of IV Zosyn for cellulitis of right foot.  * Coumadin being held. She received 1 mg of vitamin K IV yesterday. Will check protime today. She may need additional vitamin K.  Cari Caraway Beeper: [phone] 01/02/2014

## 2014-01-02 NOTE — ED Provider Notes (Signed)
Medical screening examination/treatment/procedure(s) were conducted as a shared visit with resident-physician practitioner(s) and myself.  I personally evaluated the patient during the encounter.  Pt is a 73 y.o. female with pmhx as above presenting with cyanotic R great toe with decreased pulses and US demonstrating arterial occulusion prior to ED visit. VSS, pt in NAD.   Vascular surg consulted, will admit for further mgmt.   Shanna Cisco, MD 01/02/14 1153

## 2014-01-02 NOTE — Progress Notes (Signed)
Patient ID: Marlise EvesMary P Canavan, female   DOB: 09/24/1941, 73 y.o.   MRN: [id number]   [facility] Progress Note    Assessment/ Plan:   1. Chronic kidney disease stage V with anticipated contrast exposure: 50-60% chance of progression to ESRD/maintenance hemodialysis following contrast exposure-the patient is aware and is willing to undertake this risk realizing that it might mean salvage of her limb. We'll continue to follow closely for acute dialysis needs pre-/post procedure. Based on her exam/fluid status-would not recommend IV fluids (contrast prophylaxis).  2. Right hallux ischemia: Per patient, improving pain control and awaiting angiogram-she is uncertain when. On broad-spectrum antibiotic therapy with Zosyn for associated cellulitis (clinical).  3. Anemia: Hemoglobin currently at goal and she is status post intravenous iron therapy for low Tsat . 4. Hypertension: Resumed on oral antihypertensive therapy-likely to be aggravated by pain/fluids.  5. Metabolic bone disease: On calcitriol, will start a phosphorus binder for hyperphosphatemia   Subjective:   Reports to had a good night with satisfactory pain control of her right leg. Denies any chest pain or shortness of breath.    Objective:   BP 146/60  Pulse 69  Temp(Src) 98.1 F (36.7 C) (Oral)  Resp 18  Ht 5\' 3"  (1.6 m)  Wt 69.8 kg (153 lb 14.1 oz)  BMI 27.27 kg/m2  SpO2 97%  Intake/Output Summary (Last 24 hours) at 01/02/14 0920 Last data filed at 01/01/14 1300  Gross per 24 hour  Intake    530 ml  Output      0 ml  Net    530 ml   Weight change:   Physical Exam: Gen: Comfortably resting in bed CVS: Pulse regular in rate and rhythm, S1 and S2 with an ejection systolic murmur Resp: Clear to auscultation bilaterally, no rales/rhonchi Abd: Soft, flat, nontender Ext: Right lower extremity hallux with ischemic changes-some erythema over the pretibial surface/ankle. One plus edema  Imaging: Dg Chest 2  View  01/01/2014   CLINICAL DATA:  Short of breath upon exertion  EXAM: CHEST  2 VIEW  COMPARISON:  DG CHEST 2 VIEW dated 12/22/2013  FINDINGS: Sternotomy wires overlie enlarged cardiac silhouette. There is mild increase in fine airspace disease in the left and right lung. Bilateral small effusions are present. The right effusion is larger but not changed from prior. No pneumothorax.  IMPRESSION: Congestive heart failure pattern which is mildly worsened from prior. .   Electronically Signed   By: Genevive BiStewart  Edmunds M.D.   On: 01/01/2014 18:11    Labs: BMET  Recent Labs Lab 12/31/13 1946 01/01/14 1522  NA 137 138  K 4.6 4.5  CL 97 99  CO2 18* 18*  GLUCOSE 213* 110*  BUN 92* 93*  CREATININE 5.11* 5.62*  CALCIUM 9.6 9.0  PHOS  --  6.2*   CBC  Recent Labs Lab 12/30/13 0824 12/31/13 1946 01/01/14 1522  WBC  --  14.2* 11.9*  NEUTROABS  --  12.8*  --   HGB 9.8* 11.1* 9.7*  HCT  --  33.8* 29.1*  MCV  --  81.4 81.1  PLT  --  224 204   Medications:    . amLODipine  10 mg Oral Daily  . atorvastatin  40 mg Oral Daily  . calcitRIOL  0.5 mcg Oral Daily  . carvedilol  12.5 mg Oral BID WC  . folic acid  1 mg Oral Daily  . furosemide  80 mg Oral BID  . insulin aspart  0-15 Units Subcutaneous  TID WC  . isosorbide mononitrate  15 mg Oral Daily  . omega-3 acid ethyl esters  1 g Oral TID AC & HS  . pantoprazole  40 mg Oral Daily  . piperacillin-tazobactam (ZOSYN)  IV  2.25 g Intravenous Q6H  . polyethylene glycol  17 g Oral Daily   Zetta Bills, MD 01/02/2014, 9:20 AM

## 2014-01-03 ENCOUNTER — Inpatient Hospital Stay (HOSPITAL_COMMUNITY): Payer: Medicare Other

## 2014-01-03 ENCOUNTER — Encounter (HOSPITAL_COMMUNITY)
Admission: EM | Disposition: A | Discharge status: Skilled Nursing Facility | Payer: Self-pay | Source: Home / Self Care | Attending: Internal Medicine

## 2014-01-03 ENCOUNTER — Inpatient Hospital Stay (HOSPITAL_COMMUNITY): Admission: RE | Admit: 2014-01-03 | Payer: Medicare Other | Source: Ambulatory Visit

## 2014-01-03 DIAGNOSIS — N186 End stage renal disease: Secondary | ICD-10-CM

## 2014-01-03 HISTORY — PX: ABDOMINAL AORTAGRAM: SHX5454

## 2014-01-03 LAB — CREATININE, SERUM
Creatinine, Ser: 5.9 mg/dL — ABNORMAL HIGH (ref 0.50–1.10)
GFR calc non Af Amer: 6 mL/min — ABNORMAL LOW (ref >90)
GFR, EST AFRICAN AMERICAN: 8 mL/min — AB (ref >90)

## 2014-01-03 LAB — CBC
HCT: 29 % — ABNORMAL LOW (ref 36.0–46.0)
HEMATOCRIT: 30.9 % — AB (ref 36.0–46.0)
HEMOGLOBIN: 10 g/dL — AB (ref 12.0–15.0)
Hemoglobin: 9.4 g/dL — ABNORMAL LOW (ref 12.0–15.0)
MCH: 26.8 pg (ref 26.0–34.0)
MCH: 26.6 pg (ref 26.0–34.0)
MCHC: 32.4 g/dL (ref 30.0–36.0)
MCHC: 32.4 g/dL (ref 30.0–36.0)
MCV: 82.6 fL (ref 78.0–100.0)
MCV: 82.2 fL (ref 78.0–100.0)
Platelets: 210 10*3/uL (ref 150–400)
Platelets: 240 10*3/uL (ref 150–400)
RBC: 3.51 MIL/uL — ABNORMAL LOW (ref 3.87–5.11)
RBC: 3.76 MIL/uL — AB (ref 3.87–5.11)
RDW: 15.9 % — AB (ref 11.5–15.5)
RDW: 15.8 % — ABNORMAL HIGH (ref 11.5–15.5)
WBC: 13.4 10*3/uL — ABNORMAL HIGH (ref 4.0–10.5)
WBC: 15 10*3/uL — AB (ref 4.0–10.5)

## 2014-01-03 LAB — RENAL FUNCTION PANEL
Albumin: 2.1 g/dL — ABNORMAL LOW (ref 3.5–5.2)
BUN: 98 mg/dL — ABNORMAL HIGH (ref 6–23)
CO2: 18 mEq/L — ABNORMAL LOW (ref 19–32)
Calcium: 8.9 mg/dL (ref 8.4–10.5)
Chloride: 98 mEq/L (ref 96–112)
Creatinine, Ser: 5.94 mg/dL — ABNORMAL HIGH (ref 0.50–1.10)
GFR, EST AFRICAN AMERICAN: 7 mL/min — AB (ref >90)
GFR, EST NON AFRICAN AMERICAN: 6 mL/min — AB (ref >90)
Glucose, Bld: 109 mg/dL — ABNORMAL HIGH (ref 70–99)
PHOSPHORUS: 6.9 mg/dL — AB (ref 2.3–4.6)
POTASSIUM: 5 meq/L (ref 3.7–5.3)
SODIUM: 136 meq/L — AB (ref 137–147)

## 2014-01-03 LAB — GLUCOSE, CAPILLARY
GLUCOSE-CAPILLARY: 129 mg/dL — AB (ref 70–99)
GLUCOSE-CAPILLARY: 148 mg/dL — AB (ref 70–99)
GLUCOSE-CAPILLARY: 219 mg/dL — AB (ref 70–99)

## 2014-01-03 LAB — PROTIME-INR
INR: 1.52 — ABNORMAL HIGH (ref 0.00–1.49)
PROTHROMBIN TIME: 17.9 s — AB (ref 11.6–15.2)

## 2014-01-03 LAB — HEPARIN LEVEL (UNFRACTIONATED): HEPARIN UNFRACTIONATED: 0.49 [IU]/mL (ref 0.30–0.70)

## 2014-01-03 SURGERY — ABDOMINAL AORTAGRAM
Anesthesia: LOCAL

## 2014-01-03 MED ORDER — LIDOCAINE HCL (PF) 1 % IJ SOLN
INTRAMUSCULAR | Status: AC
  Filled 2014-01-03: qty 30

## 2014-01-03 MED ORDER — HYDRALAZINE HCL 20 MG/ML IJ SOLN
10.0000 mg | INTRAMUSCULAR | Status: DC | PRN
  Administered 2014-01-09 – 2014-01-10 (×2): 10 mg via INTRAVENOUS
  Filled 2014-01-03 (×3): qty 1

## 2014-01-03 MED ORDER — DARBEPOETIN ALFA-POLYSORBATE 60 MCG/0.3ML IJ SOLN
60.0000 ug | INTRAMUSCULAR | Status: DC
  Administered 2014-01-04 – 2014-01-05 (×2): 60 ug via SUBCUTANEOUS
  Filled 2014-01-03: qty 0.3

## 2014-01-03 MED ORDER — FENTANYL CITRATE 0.05 MG/ML IJ SOLN
INTRAMUSCULAR | Status: AC
  Filled 2014-01-03: qty 2

## 2014-01-03 MED ORDER — METHYLPREDNISOLONE SODIUM SUCC 125 MG IJ SOLR: 125.0000 mg | INTRAMUSCULAR | Status: DC

## 2014-01-03 MED ORDER — SODIUM CHLORIDE 0.9 % IJ SOLN
3.0000 mL | Freq: Two times a day (BID) | INTRAMUSCULAR | Status: DC
  Administered 2014-01-03 – 2014-01-15 (×18): 3 mL via INTRAVENOUS

## 2014-01-03 MED ORDER — SODIUM CHLORIDE 0.9 % IV SOLN: 250.0000 mL | INTRAVENOUS | Status: DC | PRN

## 2014-01-03 MED ORDER — HYDRALAZINE HCL 20 MG/ML IJ SOLN
INTRAMUSCULAR | Status: AC
  Filled 2014-01-03: qty 1

## 2014-01-03 MED ORDER — HEPARIN (PORCINE) IN NACL 2-0.9 UNIT/ML-% IJ SOLN
INTRAMUSCULAR | Status: AC
  Filled 2014-01-03: qty 1000

## 2014-01-03 MED ORDER — FAMOTIDINE IN NACL 20-0.9 MG/50ML-% IV SOLN: 20.0000 mg | INTRAVENOUS | Status: DC

## 2014-01-03 MED ORDER — DIPHENHYDRAMINE HCL 50 MG/ML IJ SOLN
INTRAMUSCULAR | Status: AC
  Filled 2014-01-03: qty 1

## 2014-01-03 MED ORDER — FAMOTIDINE IN NACL 20-0.9 MG/50ML-% IV SOLN
INTRAVENOUS | Status: AC
  Filled 2014-01-03: qty 50

## 2014-01-03 MED ORDER — DIPHENHYDRAMINE HCL 50 MG/ML IJ SOLN: 25.0000 mg | INTRAMUSCULAR | Status: DC

## 2014-01-03 MED ORDER — SODIUM CHLORIDE 0.9 % IJ SOLN: 3.0000 mL | INTRAMUSCULAR | Status: DC | PRN

## 2014-01-03 MED ORDER — MIDAZOLAM HCL 2 MG/2ML IJ SOLN
INTRAMUSCULAR | Status: AC
  Filled 2014-01-03: qty 2

## 2014-01-03 MED ORDER — ENOXAPARIN SODIUM 30 MG/0.3ML ~~LOC~~ SOLN
30.0000 mg | SUBCUTANEOUS | Status: DC
  Administered 2014-01-04 – 2014-01-11 (×8): 30 mg via SUBCUTANEOUS
  Filled 2014-01-03 (×10): qty 0.3

## 2014-01-03 MED ORDER — METHYLPREDNISOLONE SODIUM SUCC 125 MG IJ SOLR
INTRAMUSCULAR | Status: AC
  Filled 2014-01-03: qty 2

## 2014-01-03 NOTE — Progress Notes (Signed)
ANTICOAGULATION CONSULT NOTE Consult  Pharmacy Consult for Heparin Indication: h/o DVT  Allergies  Allergen Reactions  . Ivp Dye [Iodinated Diagnostic Agents] Hives and Rash  . Aspirin Hives    Patient Measurements: Height: 5\' 3"  (160 cm) Weight: 153 lb 14.1 oz (69.8 kg) IBW/kg (Calculated) : 52.4 Heparin Dosing Weight:   Vital Signs: Temp: 97.4 F (36.3 C) (03/23 0500) Temp src: Oral (03/23 0500) BP: 163/67 mmHg (03/23 0500) Pulse Rate: 68 (03/23 0500)  Labs:  Recent Labs  12/31/13 1946  01/01/14 1522 01/01/14 2200 01/02/14 1903 01/03/14 0548  HGB 11.1*  --  9.7*  --   --  9.4*  HCT 33.8*  --  29.1*  --   --  29.0*  PLT 224  --  204  --   --  210  LABPROT 38.0*  < >  --  40.6* 18.2* 17.9*  INR 4.08*  < >  --  4.45* 1.55* 1.52*  HEPARINUNFRC  --   --   --   --   --  0.49  CREATININE 5.11*  --  5.62*  --  5.64*  --   < > = values in this interval not displayed.  Estimated Creatinine Clearance: 8.6 ml/min (by C-G formula based on Cr of 5.64).  Assessment: 73 yo female with h/o DVT, Coumadin on hold while awaiting arteriogram, for heparin  Goal of Therapy:  Heparin level 0.3-0.7 units/ml Monitor platelets by anticoagulation protocol: Yes   Plan:  Continue Heparin at current rate F/U after procedure today  Geannie Risen, PharmD, BCPS

## 2014-01-03 NOTE — Progress Notes (Addendum)
Notified Dr. Lenise Arena of pts c/o of numbness and pain in left hand worsening since sx. 4th and 5th fingers are more discolored then before sx. Also, made aware of EKG changes.

## 2014-01-03 NOTE — Progress Notes (Signed)
Patient ID: [name], female   DOB: 04/23/1941, 73 y.o.   MRN: [id number]  [facility] PROGRESS NOTE  [name] WUJ:[id number] DOB: 08/16/1941 DOA: 12/31/2013 PCP: Michiel SitesKOHUT,WALTER DENNIS, MD  Brief narrative:  Pt is 73 yo AAF with multiple, complex medical problems including CKD stage V from uncontrolled diabetes and HTN , PVD, [facility], with main concern of progressively worsening discoloration of the right foot hallux associated with throbbing and constant pain, 10/10 in severity, worse with touching, cold to touch, no specific alleviating factors. No reported fevers on admission, no chest pain or shortness of breath. [facility] consulted for assistance with chronic medical conditions. Main concern raised is management of the right ischemic hallux as iodinated contrast is required, risk being progression to ESRD and requiring HD. The patient has a left upper arm brachiocephalic fistula placed 09/07/2013.   Principal Problem:  Atherosclerotic PVD with ulceration  - management per primary team  - plan is to proceed with arteriography, risks of progression to ESRD discussed with pt and she is willing to proceed  - pt is now status post US guided access to the left common femoral artery, clinically stable post op Active Problems:  Cellulitis of the right foot  - continue Zosyn day #3 Left arm numbness - unclear etiology - concern for CVA is reasonable and will therefore proceed with MRI brain for further evaluation  Chronic systolic congestive  heart failure  - per 2 D ECHO 3/22 --> EF 30 - 35 %  - continue Lasix IV BID  - weight today is 153 lbs and stable over 24 hours with no significant change  CKD (chronic kidney disease), stage IV  - appreciate nephrology following  - continue to monitor renal function  - may need HD this hospitalization  COPD (chronic obstructive pulmonary disease)  - clinically compensated, maintaining oxygen saturations at target range   Essential hypertension  - BP slightly above target range  - continue Imdur, Coreg, Amlodipine  Anemia of chronic disease  - no signs of active bleeding but drop in hg since admission - aranesp started per nephrology team  - CBC in AM  DM type 2, uncontrolled, with renal complications  - reasonable inpatient control, CBG's in 130's over the past 24 hours  - A1C (3/21) 8.1  - continue SSI while inpatient  Leukocytosis  - secondary to right hallux ischemia and cellulitis  - Zosyn day #3 as noted above and repeat CBC in AM  HLD  - continue statin as per home medical regimen  Moderate malnutrition  - albumin ~2, secondary to progressive nature of the complex medical conditions outlined above   Procedures/Studies:  CXR 01/01/2014 Congestive heart failure pattern which is mildly worsened from prior. 01/03/2014 US-guided access to left common femoral artery, CO2 aortogram, selective catheterization of the right external iliac artery with right lower extremity runoff using CO2  Antibiotics:  Zosyn 3/21 -->   Code Status: Full  Family Communication: Pt at bedside  Disposition Plan: Home when medically stable  HPI/Subjective: No events overnight.   Objective: Filed Vitals:   01/03/14 1056 01/03/14 1215 01/03/14 1230 01/03/14 1448  BP:  161/84 160/82 158/68  Pulse: 53 66 70 70  Temp:    98 F (36.7 C)  TempSrc:    Oral  Resp:  22 20 18   Height:      Weight:      SpO2:  96% 99% 99%    Intake/Output Summary (Last  24 hours) at 01/03/14 1626 Last data filed at 01/02/14 2300  Gross per 24 hour  Intake      0 ml  Output    300 ml  Net   -300 ml    Exam:   General:  Pt is somnolent and confused but sable and follows commands   Cardiovascular: Regular rate and rhythm, S1/S2, no murmurs, no rubs, no gallops  Respiratory: Clear to auscultation bilaterally, no wheezing, no crackles, no rhonchi  Abdomen: Soft, non tender, non distended, bowel sounds present, no  guarding  Extremities: Right lower extremity hallux ischemic changes, +1 LE edema, left upper arm AVF  Neuro: Grossly nonfocal  Data Reviewed: Basic Metabolic Panel:  Recent Labs Lab 12/31/13 1946 01/01/14 1522 01/02/14 1903 01/03/14 0548 01/03/14 1329  NA 137 138 134* 136*  --   K 4.6 4.5 4.8 5.0  --   CL 97 99 96 98  --   CO2 18* 18* 11* 18*  --   GLUCOSE 213* 110* 138* 109*  --   BUN 92* 93* 95* 98*  --   CREATININE 5.11* 5.62* 5.64* 5.94* 5.90*  CALCIUM 9.6 9.0 9.1 8.9  --   PHOS  --  6.2* 6.9* 6.9*  --    Liver Function Tests:  Recent Labs Lab 12/31/13 1946 01/01/14 1522 01/02/14 1903 01/03/14 0548  AST 12 15  --   --   ALT 13 11  --   --   ALKPHOS 100 84  --   --   BILITOT 0.4 0.3  --   --   PROT 7.5 6.6  --   --   ALBUMIN 2.6* 2.2* 2.1* 2.1*   CBC:  Recent Labs Lab 12/30/13 0824 12/31/13 1946 01/01/14 1522 01/03/14 0548 01/03/14 1329  WBC  --  14.2* 11.9* 13.4* 15.0*  NEUTROABS  --  12.8*  --   --   --   HGB 9.8* 11.1* 9.7* 9.4* 10.0*  HCT  --  33.8* 29.1* 29.0* 30.9*  MCV  --  81.4 81.1 82.6 82.2  PLT  --  224 204 210 240   CBG:  Recent Labs Lab 01/02/14 0656 01/02/14 1134 01/02/14 1620 01/02/14 2127 01/03/14 1235  GLUCAP 132* 136* 151* 108* 129*   Scheduled Meds: . [START ON 01/04/2014] darbepoetin (ARANESP) injection - NON-DIALYSIS  60 mcg Subcutaneous Q Tue-1800  . [START ON 01/04/2014] enoxaparin (LOVENOX) injection  30 mg Subcutaneous Q24H  . insulin aspart  0-15 Units Subcutaneous TID WC  . lanthanum  500 mg Oral TID WC  . piperacillin-tazobactam (ZOSYN)  IV  2.25 g Intravenous Q6H  . polyethylene glycol  17 g Oral Daily  . sodium chloride  3 mL Intravenous Q12H   Continuous Infusions:   Debbora Presto, MD  [facility] Pager [phone]  If 7PM-7AM, please contact night-coverage [email or web address] Password [facility] 01/03/2014, 4:26 PM   LOS: 3 days

## 2014-01-03 NOTE — Clinical Documentation Improvement (Signed)
  Patient with history Chronic Systolic CHF. CxR 3/21:"IMPRESSION: Congestive heart failure pattern which is mildly worsened from prior.", 2 D ECHO 3/22 --> EF 30 - 35 %. BNP > 7000, receiving Lasix IV BID. Please clarify Acuity of CHF being treated to illustrate severity of illness and risk of mortality. Thank you.  Possible Clinical Conditions? - Chronic Systolic Congestive Heart Failure - Acute Systolic Congestive Heart Failure - Acute on Chronic Systolic Congestive Heart Failure - Other Condition (please specify)   Thank You, Beverley Fiedler ,RN Clinical Documentation Specialist:  [phone]  [facility]- Health Information Management

## 2014-01-03 NOTE — Op Note (Signed)
PATIENT: [name]   MRN: [id number] DOB: 11/03/1940    DATE OF PROCEDURE: 01/03/2014  INDICATIONS: Rhonda EvesMary P Bourque is a 73 y.o. female who presented with an ischemic right foot. Her history suggested chronic multilevel arterial occlusive disease. She presents for arteriography. She has stage V chronic kidney disease and we elected to use CO2 with limited contrast with the understanding that she may very well need dialysis in the near future regardless.  PROCEDURE:  1. Ultrasound-guided access to the left common femoral artery 2. CO2 aortogram 3. Selective catheterization of the right external iliac artery with right lower extremity runoff using CO2 4. Contrast arteriography of the distal right leg using a total of 30 cc of contrast  SURGEON: Di Kindlehristopher S. Edilia Boickson, MD, FACS  ANESTHESIA: local with sedation   EBL: minimal  TECHNIQUE: The patient was brought to the peripheral vascular lab and received half a milligram of Versed and 25 mcg of fentanyl. Both groins are prepped and draped in usual sterile fashion. Under ultrasound guidance, after the skin was anesthetized, I cannulated the common femoral artery on the left just above the bypass graft with a micropuncture needle. The micropuncture sheath was introduced over the wire. This was then exchanged for a 5 [place] sheath over a [employer] wire. The pigtail catheter was positioned at the L1 vertebral body a flush aortogram obtained using CO2. The catheter was positioned above the aortic bifurcation and an oblique iliac projection was obtained. I then used a crossover catheter to cannulate the proximal right common iliac artery. A wire was advanced down into the common femoral artery and the crossover catheter exchanged for an endhole catheter. Selective right external iliac arteriogram was obtained using CO2. Of note I attempted to advance the angled glide wire into the superficial femoral artery however was unsuccessful despite multiple attempts  using an angled Glidewire and an endhole catheter for support. There was significant calcific plaque present at the origin and I was concerned that using a more aggressive approach to cannulate the superficial femoral artery in order to obtain better visualization distally could potentially result in occlusion of the artery and therefore did not persist. I did obtain 2 films of the right leg with a total of 30 cc of contrast. A pullback pressure was then measured a cross the right common iliac artery as there was some irregularity in the proximal right common iliac artery. The resting gradient was 8 mm mercury. I considered a dressing this as there were really no other options for revascularization on the right even this was not felt to be a very significant gradient. However the common femoral artery on the right is quite small and diseased and under ultrasound the right common femoral artery is essentially full plaque and I thought that cannulating this could potentially result in occlusion and therefore elected not to proceed with right common iliac artery angioplasty at the completion of the procedure pressure was held for hemostasis after the sheath was removed. No immediate complications were noted.  FINDINGS:  1. No significant occlusive disease of the infrarenal aorta. 2. On the right side, which is the symptomatic side, there was an approximate 40% eccentric stenosis in the right common iliac artery that produced only an 8 mm of mercury resting gradient which was not felt to be clinically significant. There was diffuse disease of the external iliac and common femoral artery which were quite small. The deep femoral artery was patent. The superficial femoral artery was small and had  moderate diffuse disease as did the popliteal artery. On the right side the anterior tibial artery is occluded. There is two-vessel runoff on the right via the peroneal and posterior tibial arteries which have diffuse disease  and are quite small. 3. On the left side, there is moderate disease of the common iliac artery and also the distal external iliac artery on the left above the bypass graft. The proximal bypass graft is visualized and appears to be patent. The native superficial femoral artery is occluded. The femoral artery on the left is patent.  CLINICAL NOTE: Based on the arteriogram there really are no good options for revascularization. We will switch her from IV heparin to Lovenox, and follow her exam. If her ischemia in the right foot progresses she could potentially require amputation.  Waverly Ferrari, MD, FACS [facility]  DATE OF DICTATION:   01/03/2014

## 2014-01-03 NOTE — H&P (View-Only) (Signed)
   VASCULAR PROGRESS NOTE  SUBJECTIVE: Right foot feels a little better.  PHYSICAL EXAM: Filed Vitals:   01/01/14 0549 01/01/14 1541 01/01/14 2021 01/02/14 0420  BP: 146/77 156/49 150/73 146/60  Pulse: 72 69 69 69  Temp: 97.3 F (36.3 C) 97.9 F (36.6 C) 98.4 F (36.9 C) 98.1 F (36.7 C)  TempSrc: Oral Oral Oral Oral  Resp: 15 17 17 18  Height:      Weight: 153 lb 14.1 oz (69.8 kg)     SpO2: 94% 97% 95% 97%   No change in right foot. I am unable to palpate a right femoral pulse. She does have a left femoral pulse.  LABS: Lab Results  Component Value Date   WBC 11.9* 01/01/2014   HGB 9.7* 01/01/2014   HCT 29.1* 01/01/2014   MCV 81.1 01/01/2014   PLT 204 01/01/2014   Lab Results  Component Value Date   CREATININE 5.62* 01/01/2014   Lab Results  Component Value Date   INR 4.45* 01/01/2014   CBG (last 3)   Recent Labs  01/01/14 1640 01/01/14 2122 01/02/14 0656  GLUCAP 103* 139* 132*    Principal Problem:   Atherosclerotic PVD with ulceration Active Problems:   Chronic systolic heart failure   CKD (chronic kidney disease), stage IV   COPD (chronic obstructive pulmonary disease)   Essential hypertension   DM type 2, uncontrolled, with renal complications   ASSESSMENT AND PLAN:  * Appreciate Dr. Patel's input. The only chance for limb salvage is to proceed with arteriography using some contrast. She has stage V chronic kidney disease and it is felt that there is a 50-60% chance of progression to dialysis with contrast exposure. Patient understands this risk and is willing to proceed. I'll plan on proceeding with the arteriogram tomorrow to evaluate her options for revascularization of the right lower extremity. If we see iliac disease amenable to angioplasty this could potentially be addressed at the same time. I have reviewed with the patient the indications for arteriography. In addition, I have reviewed the potential complications of arteriography including but  not limited to: Bleeding, arterial injury, arterial thrombosis, dye action, renal insufficiency, or other unpredictable medical problems. I have explained to the patient that if we find disease amenable to angioplasty we could potentially address this at the same time. I have discussed the potential complications of angioplasty and stenting, including but not limited to: Bleeding, arterial thrombosis, arterial injury, dissection, or the need for surgical intervention.  * Day 2 of IV Zosyn for cellulitis of right foot.  * Coumadin being held. She received 1 mg of vitamin K IV yesterday. Will check protime today. She may need additional vitamin K.  Chris Milfred Krammes Beeper: [phone] 01/02/2014    

## 2014-01-03 NOTE — Progress Notes (Signed)
Patient ID: Rhonda EvesMary P [name], female   DOB: 10/16/1940, 73 y.o.   MRN: [id number]   [facility] Progress Note    Assessment/ Plan:   1. Chronic kidney disease stage V with anticipated contrast exposure:  70-80% chance of progression to ESRD/maintenance hemodialysis following contrast exposure-the patient is aware and is willing to undertake this risk realizing that it might mean salvage of her limb. We'll continue to follow closely for acute dialysis needs pre-/post procedure. Based on her exam/fluid status-would not recommend IV fluids (contrast prophylaxis).  2. Right hallux ischemia: Per patient, improving pain control and awaiting angiogram-today.  On broad-spectrum antibiotic therapy with Zosyn for associated cellulitis (clinical).  3. Anemia: Hemoglobin falling-she is status post intravenous iron therapy for low Tsat . I will start aranesp 4. Hypertension: Resumed on oral antihypertensive therapy-likely to be aggravated by pain/fluids.  5. Metabolic bone disease: On calcitriol, and fosrenol 6.  With BUN close to 100 I am almost positive that we will need to initiate dialysis this hospitalization.  I am not sure she understands this    Subjective:   This AM patient maybe seems confused- also reporting nausea   Objective:   BP 163/67  Pulse 68  Temp(Src) 97.4 F (36.3 C) (Oral)  Resp 16  Ht 5\' 3"  (1.6 m)  Wt 69.8 kg (153 lb 14.1 oz)  BMI 27.27 kg/m2  SpO2 97%  Intake/Output Summary (Last 24 hours) at 01/03/14 [id number] Last data filed at 01/02/14 2300  Gross per 24 hour  Intake    530 ml  Output    850 ml  Net   -320 ml   Weight change:   Physical Exam: Gen: Comfortably resting in bed CVS: Pulse regular in rate and rhythm, S1 and S2 with an ejection systolic murmur Resp: Clear to auscultation bilaterally, no rales/rhonchi Abd: Soft, flat, nontender Ext: Right lower extremity hallux  ischemic changes- really throughout foot. One plus edema. Left upper arm AVF- patent  but soft  Imaging: Dg Chest 2 View  01/01/2014   CLINICAL DATA:  Short of breath upon exertion  EXAM: CHEST  2 VIEW  COMPARISON:  DG CHEST 2 VIEW dated 12/22/2013  FINDINGS: Sternotomy wires overlie enlarged cardiac silhouette. There is mild increase in fine airspace disease in the left and right lung. Bilateral small effusions are present. The right effusion is larger but not changed from prior. No pneumothorax.  IMPRESSION: Congestive heart failure pattern which is mildly worsened from prior. .   Electronically Signed   By: Genevive BiStewart  Edmunds M.D.   On: 01/01/2014 18:11    Labs: BMET  Recent Labs Lab 12/31/13 1946 01/01/14 1522 01/02/14 1903 01/03/14 0548  NA 137 138 134* 136*  K 4.6 4.5 4.8 5.0  CL 97 99 96 98  CO2 18* 18* 11* 18*  GLUCOSE 213* 110* 138* 109*  BUN 92* 93* 95* 98*  CREATININE 5.11* 5.62* 5.64* 5.94*  CALCIUM 9.6 9.0 9.1 8.9  PHOS  --  6.2* 6.9* 6.9*   CBC  Recent Labs Lab 12/30/13 0824 12/31/13 1946 01/01/14 1522 01/03/14 0548  WBC  --  14.2* 11.9* 13.4*  NEUTROABS  --  12.8*  --   --   HGB 9.8* 11.1* 9.7* 9.4*  HCT  --  33.8* 29.1* 29.0*  MCV  --  81.4 81.1 82.6  PLT  --  224 204 210   Medications:    . amLODipine  10 mg Oral Daily  . atorvastatin  40 mg Oral  Daily  . calcitRIOL  0.5 mcg Oral Daily  . carvedilol  12.5 mg Oral BID WC  . folic acid  1 mg Oral Daily  . furosemide  80 mg Oral BID  . insulin aspart  0-15 Units Subcutaneous TID WC  . isosorbide mononitrate  15 mg Oral Daily  . lanthanum  500 mg Oral TID WC  . omega-3 acid ethyl esters  1 g Oral TID AC & HS  . pantoprazole  40 mg Oral Daily  . piperacillin-tazobactam (ZOSYN)  IV  2.25 g Intravenous Q6H  . polyethylene glycol  17 g Oral Daily   Jaquelynn Wanamaker A   01/03/2014, 9:04 AM

## 2014-01-03 NOTE — Progress Notes (Signed)
Called to assist with assessment of patient with onset of left hand numbness.  On arrival patient alert and oriented - she c/o numbness in left hand - her fingertips of 3 fingers are blue in color - cool to touch she c/o pain in this hand.  She states that this happens at home intermittently.  Unclear from her report what causes it to get better.  Patient has fistula left arm - good bruit and thrill - she is adamant about not having anything else done to that hand.  Attempted several times to get patient to allow [name] to doppler a radial pulse which we cannot feel.  She adamantly refused to the point of being slightly combative if we try.  She was told by her MD to never let anyone do anything to that arm/hand.  No other neurological signs noted.  The patient continues to describe this hand pain and coolness with the blue discoloration as something that happens to her at home.  RN Misty has communicated with both [facility] MD and Dr. Edilia Bo.  Will follow MRI as ordered by Dr. Izola Price.

## 2014-01-03 NOTE — Interval H&P Note (Signed)
History and Physical Interval Note:  01/03/2014 10:16 AM  [name]  has presented today for surgery, with the diagnosis of claudication  The various methods of treatment have been discussed with the patient and family. After consideration of risks, benefits and other options for treatment, the patient has consented to  Procedure(s): ABDOMINAL AORTAGRAM WITH BILATERAL RUNOFF POSSIBLE INTERVENTION (N/A) as a surgical intervention .  The patient's history has been reviewed, patient examined, no change in status, stable for surgery.  I have reviewed the patient's chart and labs.  Questions were answered to the patient's satisfaction.     DICKSON,CHRISTOPHER S

## 2014-01-04 LAB — RENAL FUNCTION PANEL
ALBUMIN: 2 g/dL — AB (ref 3.5–5.2)
BUN: 104 mg/dL — ABNORMAL HIGH (ref 6–23)
CHLORIDE: 95 meq/L — AB (ref 96–112)
CO2: 15 mEq/L — ABNORMAL LOW (ref 19–32)
Calcium: 9.1 mg/dL (ref 8.4–10.5)
Creatinine, Ser: 6.03 mg/dL — ABNORMAL HIGH (ref 0.50–1.10)
GFR calc non Af Amer: 6 mL/min — ABNORMAL LOW (ref >90)
GFR, EST AFRICAN AMERICAN: 7 mL/min — AB (ref >90)
Glucose, Bld: 161 mg/dL — ABNORMAL HIGH (ref 70–99)
POTASSIUM: 5.5 meq/L — AB (ref 3.7–5.3)
Phosphorus: 7.8 mg/dL — ABNORMAL HIGH (ref 2.3–4.6)
SODIUM: 133 meq/L — AB (ref 137–147)

## 2014-01-04 LAB — GLUCOSE, CAPILLARY
GLUCOSE-CAPILLARY: 181 mg/dL — AB (ref 70–99)
Glucose-Capillary: 182 mg/dL — ABNORMAL HIGH (ref 70–99)
Glucose-Capillary: 136 mg/dL — ABNORMAL HIGH (ref 70–99)
Glucose-Capillary: 101 mg/dL — ABNORMAL HIGH (ref 70–99)

## 2014-01-04 LAB — PROTIME-INR
INR: 1.3 (ref 0.00–1.49)
PROTHROMBIN TIME: 15.9 s — AB (ref 11.6–15.2)

## 2014-01-04 LAB — CBC
HCT: 29.6 % — ABNORMAL LOW (ref 36.0–46.0)
Hemoglobin: 9.7 g/dL — ABNORMAL LOW (ref 12.0–15.0)
MCH: 26.6 pg (ref 26.0–34.0)
MCHC: 32.8 g/dL (ref 30.0–36.0)
MCV: 81.3 fL (ref 78.0–100.0)
Platelets: 246 10*3/uL (ref 150–400)
RBC: 3.64 MIL/uL — ABNORMAL LOW (ref 3.87–5.11)
RDW: 15.7 % — ABNORMAL HIGH (ref 11.5–15.5)
WBC: 14.1 10*3/uL — ABNORMAL HIGH (ref 4.0–10.5)

## 2014-01-04 MED ORDER — AMLODIPINE BESYLATE 5 MG PO TABS
5.0000 mg | ORAL_TABLET | Freq: Every day | ORAL | Status: DC
  Administered 2014-01-04 – 2014-01-06 (×3): 5 mg via ORAL
  Filled 2014-01-04 (×3): qty 1

## 2014-01-04 MED ORDER — PIPERACILLIN-TAZOBACTAM IN DEX 2-0.25 GM/50ML IV SOLN
2.2500 g | Freq: Three times a day (TID) | INTRAVENOUS | Status: DC
  Administered 2014-01-04 – 2014-01-13 (×24): 2.25 g via INTRAVENOUS
  Filled 2014-01-04 (×28): qty 50

## 2014-01-04 NOTE — Progress Notes (Signed)
ANTIBIOTIC CONSULT NOTE - follow up Pharmacy Consult for zosyn Indication: R foot cellulitis  Allergies  Allergen Reactions  . Ivp Dye [Iodinated Diagnostic Agents] Hives and Rash  . Aspirin Hives    Patient Measurements: Height: 5\' 3"  (160 cm) Weight: 153 lb 14.1 oz (69.8 kg) IBW/kg (Calculated) : 52.4   Vital Signs: Temp: 98.3 F (36.8 C) (03/24 1351) Temp src: Oral (03/24 0435) BP: 138/55 mmHg (03/24 1351) Pulse Rate: 62 (03/24 1351) Intake/Output from previous day:   Intake/Output from this shift:    Labs:  Recent Labs  01/03/14 0548 01/03/14 1329 01/04/14 0550  WBC 13.4* 15.0* 14.1*  HGB 9.4* 10.0* 9.7*  PLT 210 240 246  CREATININE 5.94* 5.90* 6.03*   Estimated Creatinine Clearance: 8 ml/min (by C-G formula based on Cr of 6.03). No results found for this basename: VANCOTROUGH, VANCOPEAK, VANCORANDOM, GENTTROUGH, GENTPEAK, GENTRANDOM, TOBRATROUGH, TOBRAPEAK, TOBRARND, AMIKACINPEAK, AMIKACINTROU, AMIKACIN,  in the last 72 hours   Microbiology: No results found for this or any previous visit (from the past 720 hour(s)).  Medical History: Past Medical History  Diagnosis Date  . CAD (coronary artery disease)   . Hypertension   . Diabetes mellitus     type 2 insulin dependent  . Hyperlipidemia   . CHF (congestive heart failure)   . Cellulitis     left foot  . Ulcer     diabetic ulcer on left foot  . DVT (deep venous thrombosis)     peroneal vein  . Peripheral vascular disease   . Chronic kidney disease   . Anemia   . Shortness of breath     with fluid  . Insomnia     Assessment: 73 yo female with chronic RLE ischemia and here with ischemic foot.  Day # 4 zosyn for right foot cellulitis (patient was on keflex PTA).  WBC= 14.1, afeb, SCr= 6.03 and CrCl ~ 8. Most likely will start HD today due to worsening renal failure.  No good options for revascularization, may need amputation of R foot.    3/21 zosyn>>  Plan:  -change Zosyn 2.25gm IV q6h to  q8h in anticipation of starting HD -Will follow renal function and clinical progress Herby Abraham, Pharm.D. [phone] 01/04/2014 2:08 PM

## 2014-01-04 NOTE — Progress Notes (Signed)
VASCULAR LAB PRELIMINARY  PRELIMINARY  PRELIMINARY  PRELIMINARY  Duplex of left brachio cephalic AVF completed.    Preliminary report:  No noticeable thrill in the cephalic vein except at the AC/anastomosis.  Velocity at the anastomosis is  474/251cm/sec.  There are three main branches of the cephalic vein.  The first, at the lower upper arm, measures 2.79mm.  The second, in the mid upper arm, measures 2.77mm.  At the axilla, it measures 3.26mm.  The cephalic vein measures 6.34mm, 6.8TM, and 5.35mm at the same locations.  Ragan Reale, RVT 01/04/2014, 5:09 PM

## 2014-01-04 NOTE — Progress Notes (Signed)
   VASCULAR PROGRESS NOTE  SUBJECTIVE: She says that her right foot does not hurt.  PHYSICAL EXAM: Filed Vitals:   01/03/14 2200 01/04/14 0435 01/04/14 1038 01/04/14 1351  BP: 154/72 168/68 130/57 138/55  Pulse: 68 66  62  Temp:  97.8 F (36.6 C)  98.3 F (36.8 C)  TempSrc:  Oral    Resp:  18    Height:      Weight:      SpO2:  95%  98%   Right foot ischemia looks a little worse No hematoma left groin (s/p cath yesterday)  LABS: Lab Results  Component Value Date   WBC 14.1* 01/04/2014   HGB 9.7* 01/04/2014   HCT 29.6* 01/04/2014   MCV 81.3 01/04/2014   PLT 246 01/04/2014   Lab Results  Component Value Date   CREATININE 6.03* 01/04/2014   Lab Results  Component Value Date   INR 1.30 01/04/2014   CBG (last 3)   Recent Labs  01/04/14 0620 01/04/14 1109 01/04/14 1608  GLUCAP 182* 181* 136*    Principal Problem:   Atherosclerotic PVD with ulceration Active Problems:   Chronic systolic heart failure   CKD (chronic kidney disease), stage IV   COPD (chronic obstructive pulmonary disease)   Essential hypertension   DM type 2, uncontrolled, with renal complications   ASSESSMENT AND PLAN:  * Angiogram yesterday showed diffuse multilevel disease with no options for revascularization.   * If the foot progresses, she will need a right BKA. I have discussed this with her and she understands. The family also understands.   * Duplex of left AVF shows narrowing at arterial anastomosis and several branches. If she starts dialysis, she will likely need a tunneled dialysis catheter as the AVF is not ready to use. She could have a fistulogram after she starts HD.      Cari Caraway Beeper: [phone] 01/04/2014

## 2014-01-04 NOTE — Progress Notes (Signed)
I spoke with pts daughter and POA [name] [phone]- she said that she would be here from 8 AM until lunchtime tomorrow and requests to speak with me and Dr. Edilia Bo about the future plans for her Mom.  She is aware that these future plans are likely to include an amputation and initiation of dialysis.

## 2014-01-04 NOTE — Care Management Note (Unsigned)
    Page 1 of 1   01/13/2014     4:40:21 PM   CARE MANAGEMENT NOTE 01/13/2014  Patient:  [name], [name]   Account Number:  [id number]  Date Initiated:  01/04/2014  Documentation initiated by:  Tayden Duran  Subjective/Objective Assessment:   PT ADM ON 3/20 WITH RT FOOT PAIN.  PTA, PT RESIDED AT HOME WITH DAUGHTER.     Action/Plan:   WILL NEED P.T. CONSULT WHEN ABLE TO TOLERATE THERAPY TO DETERMINE HOME NEEDS.   Anticipated DC Date:  01/14/2014   Anticipated DC Plan:  SKILLED NURSING FACILITY  In-house referral  Clinical Social Worker      DC Planning Services  CM consult      Choice offered to / List presented to:             Status of service:  In process, will continue to follow Medicare Important Message given?   (If response is "NO", the following Medicare IM given date fields will be blank) Date Medicare IM given:   Date Additional Medicare IM given:    Discharge Disposition:    Per UR Regulation:  Reviewed for med. 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  CSW CONSULTED FOR POSS DC TO SNF AT DC WHEN MEDICALLY STABLE.  WILL FOLLOW PROGRESS.

## 2014-01-04 NOTE — Clinical Documentation Improvement (Signed)
  Patient with hx Chronic Systolic CHF. CxR 3/21:"Congestive heart failure pattern which is mildly worsened from prior.", 2 D ECHO 3/22 --> EF 30 - 35 %. BNP > 7000, treated with Lasix IV BID. Please clarify CHF being treated to illustrate severity of illness and risk of mortality. Thank you.   Possible Clinical Conditions?  - Mild decompensated Chronic Systolic Congestive Heart Failure  - Acute on Chronic Systolic Congestive Heart Failure  - Other Condition (please specify)   Thank You, Beverley Fiedler ,RN Clinical Documentation Specialist:  [phone]  [facility]- Health Information Management

## 2014-01-04 NOTE — Progress Notes (Signed)
Patient ID: [name], female   DOB: 1941-04-20, 73 y.o.   MRN: [id number]   [facility] Progress Note    Assessment/ Plan:   1. Chronic kidney disease stage V with contrast exposure: Patient was really on the border prior to exposure and now simply by numbers getting worse, possible worsening of confusion- I think that we need to go ahead and initiate dialysis therapy.  The patient seems agreeable but her confusion is noted and I have a call in to her POA to discuss.  As soon as I discuss with them will initiate HD and start procedure for search of OP HD unit. 2. Right hallux ischemia: unfortunately no good options for revasc- I am afraid this is heading to amputation.  Pain is severe, at times better controlled than others.  On broad-spectrum antibiotic therapy with Zosyn for associated cellulitis.  3. Anemia: Hemoglobin falling-she is status post intravenous iron therapy for low Tsat . I have  started aranesp as well 4. Hypertension: thought on norvasc , willl resume-likely to be aggravated by pain/fluids.  5. Metabolic bone disease: On  fosrenol- was previously on calcitriol, will change to hectorol once starts on HD and follow PTH 6. Possible steal- hand warm and no pain today.  Will follow clinically     Subjective:   Results of angio noted- no good options for revascularization- may need amputation- c/o hand numbness overnight.  Kidney function numbers worse    Objective:   BP 168/68  Pulse 66  Temp(Src) 97.8 F (36.6 C) (Oral)  Resp 18  Ht 5\' 3"  (1.6 m)  Wt 69.8 kg (153 lb 14.1 oz)  BMI 27.27 kg/m2  SpO2 95% No intake or output data in the 24 hours ending 01/04/14 0859 Weight change:   Physical Exam: Gen: Comfortably resting in bed CVS: Pulse regular in rate and rhythm, S1 and S2 with an ejection systolic murmur Resp: Clear to auscultation bilaterally, no rales/rhonchi Abd: Soft, flat, nontender Ext: Right lower extremity hallux  ischemic changes- really  throughout foot. One plus edema. Left upper arm AVF- patent but soft  Imaging: Mr Brain Wo Contrast  01/03/2014   CLINICAL DATA:  Left arm numbness. Evaluate for stroke versus related to recent surgical procedure.  EXAM: MRI HEAD WITHOUT CONTRAST  TECHNIQUE: Multiplanar, multiecho pulse sequences of the brain and surrounding structures were obtained without intravenous contrast.  COMPARISON:  None.  FINDINGS: The patient was unable to remain motionless for the exam. Small or subtle lesions could be overlooked.  No acute stroke is evident. There is generalized atrophy with moderate chronic microvascular ischemic change. Remote right pontine and right basal ganglia lacunes are observed. Diffuse marrow signal abnormality consistent with chronic anemia/chronic renal disease. Cervical spondylosis. Left C1-C2 joint effusion, likely degenerative. Mild pannus.  IMPRESSION: Atrophy.  Chronic microvascular ischemic change.  Remote areas of ischemia without visible acute infarction. No underlying abnormality is seen which might contribute to left arm numbness.   Electronically Signed   By: Davonna Belling M.D.   On: 01/03/2014 17:57    Labs: BMET  Recent Labs Lab 12/31/13 1946 01/01/14 1522 01/02/14 1903 01/03/14 0548 01/03/14 1329 01/04/14 0550  NA 137 138 134* 136*  --  133*  K 4.6 4.5 4.8 5.0  --  5.5*  CL 97 99 96 98  --  95*  CO2 18* 18* 11* 18*  --  15*  GLUCOSE 213* 110* 138* 109*  --  161*  BUN 92* 93* 95*  98*  --  104*  CREATININE 5.11* 5.62* 5.64* 5.94* 5.90* 6.03*  CALCIUM 9.6 9.0 9.1 8.9  --  9.1  PHOS  --  6.2* 6.9* 6.9*  --  7.8*   CBC  Recent Labs Lab 12/31/13 1946 01/01/14 1522 01/03/14 0548 01/03/14 1329 01/04/14 0550  WBC 14.2* 11.9* 13.4* 15.0* 14.1*  NEUTROABS 12.8*  --   --   --   --   HGB 11.1* 9.7* 9.4* 10.0* 9.7*  HCT 33.8* 29.1* 29.0* 30.9* 29.6*  MCV 81.4 81.1 82.6 82.2 81.3  PLT 224 204 210 240 246   Medications:    . darbepoetin (ARANESP) injection -  NON-DIALYSIS  60 mcg Subcutaneous Q Tue-1800  . enoxaparin (LOVENOX) injection  30 mg Subcutaneous Q24H  . insulin aspart  0-15 Units Subcutaneous TID WC  . lanthanum  500 mg Oral TID WC  . piperacillin-tazobactam (ZOSYN)  IV  2.25 g Intravenous Q6H  . polyethylene glycol  17 g Oral Daily  . sodium chloride  3 mL Intravenous Q12H   Cally Nygard A   01/04/2014, 8:59 AM

## 2014-01-04 NOTE — Progress Notes (Signed)
Patient ID: [name], female   DOB: 06/09/1941, 73 y.o.   MRN: [id number]  [facility] PROGRESS NOTE  Rhonda EvesMary P Purdom [id number] DOB: 12/30/1940 DOA: 12/31/2013 PCP: Michiel SitesKOHUT,WALTER DENNIS, MD  Brief narrative:  Pt is 73 yo AAF with multiple, complex medical problems including CKD stage V from uncontrolled diabetes and HTN , PVD, [facility], with main concern of progressively worsening discoloration of the right foot hallux associated with throbbing and constant pain, 10/10 in severity, worse with touching, cold to touch, no specific alleviating factors. No reported fevers on admission, no chest pain or shortness of breath. [facility] consulted for assistance with chronic medical conditions. Main concern raised is management of the right ischemic hallux as iodinated contrast is required, risk being progression to ESRD and requiring HD. The patient has a left upper arm brachiocephalic fistula placed 09/07/2013.   Principal Problem:  Atherosclerotic PVD with ulceration  - management per primary team  - pt is now status post US guided access to the left common femoral artery, clinically stable post op day #1 - no good option for revascularization, may need right BKA, this was discussed with pt and her daughter at extent and they both verbalized understanding  Active Problems:  Cellulitis of the right foot  - continue Zosyn day #4 Left arm numbness  - unclear etiology  - MRI negative for acute events but chronic ischemic changes noted  - better this Am per pt Chronic systolic congestive heart failure  - per 2 D ECHO 3/22 --> EF 30 - 35 %  - continue Lasix IV BID  - weight today is 153 lbs and stable over 48 hours with no significant change  CKD (chronic kidney disease), stage IV  - appreciate nephrology following  - continue to monitor renal function  - need for HD inpatient per nephrology  Hyperkalemia - will need HD and will defer to nephrology team  COPD (chronic  obstructive pulmonary disease)  - clinically compensated, maintaining oxygen saturations at target range  Essential hypertension  - BP slightly above target range  - continue Imdur, Coreg, Amlodipine  Anemia of chronic disease  - no signs of active bleeding but drop in hg since admission  - aranesp started per nephrology team  - CBC in AM  DM type 2, uncontrolled, with renal complications  - reasonable inpatient control, CBG's in 130's over the past 24 hours  - A1C (3/21) 8.1  - continue SSI while inpatient  Leukocytosis  - secondary to right hallux ischemia and cellulitis  - Zosyn day #3 as noted above and repeat CBC in AM  HLD  - continue statin as per home medical regimen  Moderate malnutrition  - albumin ~2, secondary to progressive nature of the complex medical conditions outlined above   Procedures/Studies:  CXR 01/01/2014 Congestive heart failure pattern which is mildly worsened from prior.  01/03/2014 US-guided access to left common femoral artery, CO2 aortogram, selective catheterization of the right external iliac artery with right lower extremity runoff using CO2  Antibiotics:  Zosyn 3/21 -->   Code Status: Full  Family Communication: Pt and daughter at bedside  Disposition Plan: Home when medically stable, daughter wants to take her home, no SNF   HPI/Subjective: No events overnight.   Objective: Filed Vitals:   01/03/14 2200 01/04/14 0435 01/04/14 1038 01/04/14 1351  BP: 154/72 168/68 130/57 138/55  Pulse: 68 66  62  Temp:  97.8 F (36.6 C)  98.3 F (36.8  C)  TempSrc:  Oral    Resp:  18    Height:      Weight:      SpO2:  95%  98%    Intake/Output Summary (Last 24 hours) at 01/04/14 1937 Last data filed at 01/04/14 1825  Gross per 24 hour  Intake    540 ml  Output      0 ml  Net    540 ml    Exam:   General:  Pt is alert, follows commands appropriately, not in acute distress  Cardiovascular: Regular rate and rhythm, S1/S2, no murmurs, no rubs,  no gallops  Respiratory: Clear to auscultation bilaterally, no wheezing, no crackles, no rhonchi  Abdomen: Soft, non tender, non distended, bowel sounds present, no guarding  Extremities: Right foot cold and ischemia, edema and tender to palpation   Data Reviewed: Basic Metabolic Panel:  Recent Labs Lab 12/31/13 1946 01/01/14 1522 01/02/14 1903 01/03/14 0548 01/03/14 1329 01/04/14 0550  NA 137 138 134* 136*  --  133*  K 4.6 4.5 4.8 5.0  --  5.5*  CL 97 99 96 98  --  95*  CO2 18* 18* 11* 18*  --  15*  GLUCOSE 213* 110* 138* 109*  --  161*  BUN 92* 93* 95* 98*  --  104*  CREATININE 5.11* 5.62* 5.64* 5.94* 5.90* 6.03*  CALCIUM 9.6 9.0 9.1 8.9  --  9.1  PHOS  --  6.2* 6.9* 6.9*  --  7.8*   Liver Function Tests:  Recent Labs Lab 12/31/13 1946 01/01/14 1522 01/02/14 1903 01/03/14 0548 01/04/14 0550  AST 12 15  --   --   --   ALT 13 11  --   --   --   ALKPHOS 100 84  --   --   --   BILITOT 0.4 0.3  --   --   --   PROT 7.5 6.6  --   --   --   ALBUMIN 2.6* 2.2* 2.1* 2.1* 2.0*   CBC:  Recent Labs Lab 12/31/13 1946 01/01/14 1522 01/03/14 0548 01/03/14 1329 01/04/14 0550  WBC 14.2* 11.9* 13.4* 15.0* 14.1*  NEUTROABS 12.8*  --   --   --   --   HGB 11.1* 9.7* 9.4* 10.0* 9.7*  HCT 33.8* 29.1* 29.0* 30.9* 29.6*  MCV 81.4 81.1 82.6 82.2 81.3  PLT 224 204 210 240 246   CBG:  Recent Labs Lab 01/03/14 1737 01/03/14 2116 01/04/14 0620 01/04/14 1109 01/04/14 1608  GLUCAP 148* 219* 182* 181* 136*   Scheduled Meds: . amLODipine  5 mg Oral Daily  . darbepoetin (ARANESP) injection - NON-DIALYSIS  60 mcg Subcutaneous Q Tue-1800  . enoxaparin (LOVENOX) injection  30 mg Subcutaneous Q24H  . insulin aspart  0-15 Units Subcutaneous TID WC  . lanthanum  500 mg Oral TID WC  . piperacillin-tazobactam (ZOSYN)  IV  2.25 g Intravenous 3 times per day  . polyethylene glycol  17 g Oral Daily  . sodium chloride  3 mL Intravenous Q12H   Continuous Infusions:  Debbora Presto, MD  [facility] Pager [phone]  If 7PM-7AM, please contact night-coverage [email or web address] Password [facility] 01/04/2014, 7:37 PM   LOS: 4 days

## 2014-01-05 ENCOUNTER — Inpatient Hospital Stay (HOSPITAL_COMMUNITY): Payer: Medicare Other

## 2014-01-05 DIAGNOSIS — L98499 Non-pressure chronic ulcer of skin of other sites with unspecified severity: Secondary | ICD-10-CM

## 2014-01-05 DIAGNOSIS — I739 Peripheral vascular disease, unspecified: Secondary | ICD-10-CM

## 2014-01-05 DIAGNOSIS — R4182 Altered mental status, unspecified: Secondary | ICD-10-CM

## 2014-01-05 LAB — GLUCOSE, CAPILLARY
GLUCOSE-CAPILLARY: 100 mg/dL — AB (ref 70–99)
GLUCOSE-CAPILLARY: 98 mg/dL (ref 70–99)
Glucose-Capillary: 91 mg/dL (ref 70–99)
Glucose-Capillary: 98 mg/dL (ref 70–99)

## 2014-01-05 LAB — RENAL FUNCTION PANEL
Albumin: 2 g/dL — ABNORMAL LOW (ref 3.5–5.2)
BUN: 105 mg/dL — AB (ref 6–23)
CALCIUM: 8.5 mg/dL (ref 8.4–10.5)
CO2: 16 mEq/L — ABNORMAL LOW (ref 19–32)
Chloride: 93 mEq/L — ABNORMAL LOW (ref 96–112)
Creatinine, Ser: 6.65 mg/dL — ABNORMAL HIGH (ref 0.50–1.10)
GFR calc Af Amer: 6 mL/min — ABNORMAL LOW (ref >90)
GFR calc non Af Amer: 6 mL/min — ABNORMAL LOW (ref >90)
GLUCOSE: 94 mg/dL (ref 70–99)
PHOSPHORUS: 7.1 mg/dL — AB (ref 2.3–4.6)
Potassium: 5.3 mEq/L (ref 3.7–5.3)
Sodium: 132 mEq/L — ABNORMAL LOW (ref 137–147)

## 2014-01-05 MED ORDER — NALOXONE HCL 0.4 MG/ML IJ SOLN: 0.4000 mg | INTRAMUSCULAR | Status: DC | PRN

## 2014-01-05 MED ORDER — HYDROMORPHONE 0.3 MG/ML IV SOLN
INTRAVENOUS | Status: DC
  Administered 2014-01-05: 03:00:00 via INTRAVENOUS
  Filled 2014-01-05: qty 25

## 2014-01-05 MED ORDER — DIPHENHYDRAMINE HCL 12.5 MG/5ML PO ELIX
12.5000 mg | ORAL_SOLUTION | Freq: Four times a day (QID) | ORAL | Status: DC | PRN
  Filled 2014-01-05: qty 5

## 2014-01-05 MED ORDER — SODIUM CHLORIDE 0.9 % IJ SOLN: 9.0000 mL | INTRAMUSCULAR | Status: DC | PRN

## 2014-01-05 MED ORDER — ONDANSETRON HCL 4 MG/2ML IJ SOLN: 4.0000 mg | Freq: Four times a day (QID) | INTRAMUSCULAR | Status: DC | PRN

## 2014-01-05 MED ORDER — DIPHENHYDRAMINE HCL 50 MG/ML IJ SOLN: 12.5000 mg | Freq: Four times a day (QID) | INTRAMUSCULAR | Status: DC | PRN

## 2014-01-05 MED ORDER — DARBEPOETIN ALFA-POLYSORBATE 60 MCG/0.3ML IJ SOLN
INTRAMUSCULAR | Status: AC
  Filled 2014-01-05: qty 0.3

## 2014-01-05 NOTE — Progress Notes (Signed)
Patient ID: [name], female   DOB: Mar 23, 1941, 73 y.o.   MRN: [id number]  [facility] PROGRESS NOTE  [name] [id number] DOB: 1940-10-24 DOA: 12/31/2013 PCP: Dwan Bolt, MD  Brief narrative:  Pt is 73 yo AAF with multiple, complex medical problems including CKD stage V from uncontrolled diabetes and HTN , PVD, presented to [facility] ED March 20th, with main concern of progressively worsening discoloration of the right foot hallux associated with throbbing and constant pain, 10/10 in severity, worse with touching, cold to touch, no specific alleviating factors. No reported fevers on admission, no chest pain or shortness of breath. [facility] consulted for assistance with chronic medical conditions. Main concern raised is management of the right ischemic hallux as iodinated contrast is required, risk being progression to ESRD and requiring HD. The patient has a left upper arm brachiocephalic fistula placed 96/78/9381.   Atherosclerotic PVD with ulceration  - management per primary team  - pt is now status post US guided access to the left common femoral artery, clinically stable post op day #1 - no good option for revascularization, may need right BKA, this was discussed with pt and her daughter at extent and they both verbalized understanding  Cellulitis of the right foot  - continue Zosyn day #4 Left arm numbness  - unclear etiology  - MRI negative for acute events but chronic ischemic changes noted  - better this Am per pt ?Confusion and transient blindness - patient recognized me when I walked into her room as we have met last November however could not tell me how many fingers I had up (~ 1 foot away from her eyes). - neurology consulted, appreciate input.  Chronic systolic congestive heart failure - per 2 D ECHO 3/22 --> EF 30 - 35 %  - weight today 173 lbs? - to have HD soon, appreciate nephrology input.  CKD (chronic kidney disease), stage IV - HD today Hyperkalemia- will  need HD and will defer to nephrology team  COPD (chronic obstructive pulmonary disease) - clinically compensated, maintaining oxygen saturations at target range  Essential hypertension - continue Imdur, Coreg, Amlodipine  Anemia of chronic disease  - no signs of active bleeding but drop in hg since admission  - aranesp started per nephrology team  - CBC in AM  DM type 2, uncontrolled, with renal complications  - reasonable inpatient control, CBG's in 130's over the past 24 hours  - A1C (3/21) 8.1  - continue SSI while inpatient  Leukocytosis  - secondary to right hallux ischemia and cellulitis  - Zosyn day #4 as noted above and repeat CBC in AM  HLD  - continue statin as per home medical regimen  Moderate malnutrition  - albumin ~2, secondary to progressive nature of the complex medical conditions outlined above   Procedures/Studies:  CXR 01/01/2014 Congestive heart failure pattern which is mildly worsened from prior.  01/03/2014 US-guided access to left common femoral artery, CO2 aortogram, selective catheterization of the right external iliac artery with right lower extremity runoff using CO2  Antibiotics:  Zosyn 3/21 -->   Code Status: Full  Family Communication: Pt and daughter at bedside  Disposition Plan: Home when medically stable, daughter wants to take her home, no SNF; plan for surgery per vascular early next week.   HPI/Subjective: No events overnight.   Objective: Filed Vitals:   01/05/14 0609 01/05/14 0800 01/05/14 1100 01/05/14 1200  BP: 146/59     Pulse: 60     Temp:  98 F (36.7 C)     TempSrc: Oral     Resp: 18 16  16   Height:   5' 3"  (1.6 m)   Weight:   78.7 kg (173 lb 8 oz)   SpO2: 98% 99%  99%    Intake/Output Summary (Last 24 hours) at 01/05/14 1442 Last data filed at 01/05/14 0730  Gross per 24 hour  Intake    390 ml  Output      0 ml  Net    390 ml   Exam:  General:  Pt is alert, follows commands appropriately, not in acute  distress  Cardiovascular: Regular rate and rhythm, S1/S2, no murmurs, no rubs, no gallops  Respiratory: Clear to auscultation bilaterally, no wheezing, no crackles, no rhonchi  Abdomen: Soft, non tender, non distended, bowel sounds present, no guarding  Extremities: Right foot cold and ischemia, edema and tender to palpation   Data Reviewed: Basic Metabolic Panel:  Recent Labs Lab 01/01/14 1522 01/02/14 1903 01/03/14 0548 01/03/14 1329 01/04/14 0550 01/05/14 0307  NA 138 134* 136*  --  133* 132*  K 4.5 4.8 5.0  --  5.5* 5.3  CL 99 96 98  --  95* 93*  CO2 18* 11* 18*  --  15* 16*  GLUCOSE 110* 138* 109*  --  161* 94  BUN 93* 95* 98*  --  104* 105*  CREATININE 5.62* 5.64* 5.94* 5.90* 6.03* 6.65*  CALCIUM 9.0 9.1 8.9  --  9.1 8.5  PHOS 6.2* 6.9* 6.9*  --  7.8* 7.1*   Liver Function Tests:  Recent Labs Lab 12/31/13 1946 01/01/14 1522 01/02/14 1903 01/03/14 0548 01/04/14 0550 01/05/14 0307  AST 12 15  --   --   --   --   ALT 13 11  --   --   --   --   ALKPHOS 100 84  --   --   --   --   BILITOT 0.4 0.3  --   --   --   --   PROT 7.5 6.6  --   --   --   --   ALBUMIN 2.6* 2.2* 2.1* 2.1* 2.0* 2.0*   CBC:  Recent Labs Lab 12/31/13 1946 01/01/14 1522 01/03/14 0548 01/03/14 1329 01/04/14 0550  WBC 14.2* 11.9* 13.4* 15.0* 14.1*  NEUTROABS 12.8*  --   --   --   --   HGB 11.1* 9.7* 9.4* 10.0* 9.7*  HCT 33.8* 29.1* 29.0* 30.9* 29.6*  MCV 81.4 81.1 82.6 82.2 81.3  PLT 224 204 210 240 246   CBG:  Recent Labs Lab 01/04/14 1109 01/04/14 1608 01/04/14 2107 01/05/14 0610 01/05/14 1128  GLUCAP 181* 136* 101* 91 100*   Scheduled Meds: . amLODipine  5 mg Oral Daily  . darbepoetin (ARANESP) injection - NON-DIALYSIS  60 mcg Subcutaneous Q Tue-1800  . enoxaparin (LOVENOX) injection  30 mg Subcutaneous Q24H  . insulin aspart  0-15 Units Subcutaneous TID WC  . lanthanum  500 mg Oral TID WC  . piperacillin-tazobactam (ZOSYN)  IV  2.25 g Intravenous 3 times per day   . polyethylene glycol  17 g Oral Daily  . sodium chloride  3 mL Intravenous Q12H   Continuous Infusions:   Marzetta Board, MD  Pager [phone]  Time spent: 35 minutes.   If 7PM-7AM, please contact night-coverage [email or web address] Password [email or web address] 01/05/2014, 2:42 PM   LOS: 5 days

## 2014-01-05 NOTE — Significant Event (Signed)
Rapid Response Event Note  Overview: Time Called: 0629 Arrival Time: 0631 Event Type: Neurologic  Initial Focused Assessment: Responded to page indicating need for stroke team, upon arrival neurologist on phone with bedside RN indicating that this patient was not a Code Stroke as there was no exact last known well. Patient laying in bed, arouses to name, but easily falls back asleep. Bedside RN reports that patient had forgetfulness and confusion throughout the day (3/24). Patient complained of pain in her left leg around 2200 and PRN pain meds were administered. Patient continued to complain of pain around 2400 and orders were received for PCA. When bedside RN went to teach patient about PCA around 0130-0200 patient was having difficulty understanding. On Vascular MD rounds around 0615/0620, it was noted that patient had generalized weakness. Patient is only oriented to self, follows some commands, weak in all extremities, and has some global aphasia. VSS. NIH stroke scale 13  Interventions: Neurologist updated regarding patient's assessment. Bedside RN updated [facility] since they had been following as well. Day shift neurologist will follow. Will continue to monitor, advised bedside RN to call with further needs  Event Summary: Name of Physician Notified: Edilia Bo, MD at [phone]  Name of Consulting Physician Notified: Roseanne Reno, MD at 0630  Outcome: Stayed in room and stabalized  Event End Time: 0488  Lenord Carbo

## 2014-01-05 NOTE — Progress Notes (Signed)
Patient complaint of 10 of 10 rt. Foot pain approximately 23:00, medicated with 5mg  of oxyir and 1 percocet. Upon reassessment of pain, patient had no relief, full dose pca was ordered however patient could not take instruction on  PCA use due to confusion. Patient had profound generalized weakness, with virtually no response to pain stimuli. Physician notified, continue to monitor.

## 2014-01-05 NOTE — Consult Note (Signed)
NEURO HOSPITALIST CONSULT NOTE    Reason for Consult: AMS  HPI:                                                                                                                                          [name] is an 73 y.o. 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  Pateint underwent a arteriogram of her LE which found no good options for revascularization. During hospitalization she had complaints of left hand numbness in the tips of her fingers. MRI brain was obtained showing no acute infarct. On 01/05/14 rapid response was called for decreased mental status. Per RRN note " Patient complained of pain in her left leg around 2200 and PRN pain meds were administered. Patient continued to complain of pain around 2400 and orders were received for PCA. When bedside RN went to teach patient about PCA around 0130-0200 patient was having difficulty understanding. Patient is only oriented to self, follows some commands, weak in all extremities, and has some global aphasia."  On consultation patient is awake and oriented to hospital and month.  She cannot give me any history and is very inconsistent. She states she can see my hand but when asked to count fingers cannot.  She is complaining about not being able to move her left arm but lifting arm and wiping face when distracted. Son is at bedside, and states she was driving a car one month ago. He feels she is not her baseline (usually able to hold a clear conversation and take part in exam).    Of note she is going for her first round of dialysis today due to phosphorus 7.1, Cr 6.65, BUN 105, WBC 14.1   Past Medical History  Diagnosis Date  . CAD (coronary artery disease)   . Hypertension   . Diabetes mellitus     type 2 insulin dependent  . Hyperlipidemia   . CHF (congestive heart failure)   . Cellulitis     left  foot  . Ulcer     diabetic ulcer on left foot  . DVT (deep venous thrombosis)     peroneal vein  . Peripheral vascular disease   . Chronic kidney disease   . Anemia   . Shortness of breath     with fluid  . Insomnia     Past Surgical History  Procedure Laterality Date  . Tubal ligation    . Pr vein bypass graft,aorto-fem-pop  06-26-11    1. PATENT LEFT FEMORAL-POPLITEAL BYPASS GRAFT W/NO EVEIDENCE OF STENOSIS. 2.VELOCITIES OF GREATER THAN 200 CM/'s NOTED ON PREVIOUS EXAM 10/03/11 WERE NOT ADEQUATELY VISULAIZED DURING THIS EXAM  . Coronary artery  bypass graft  04/25/08    Dr. Tyrone Sage, CABG x 5   . Nm myoview ltd  01/13/13    LEXISCAN; LV WALL MOTION: LVEF 44%, INFERIOR AKINESIS, ANTEROAPICAL HYPOKINESIS  . Cardiac catheterization  04/22/08    SEVERE 3-VESSEL DISEASE CAD., CABG X 5 04/25/08 WITH DR. ZOXWRUEA  . Av fistula placement Left 09/07/2013    Procedure: ARTERIOVENOUS (AV) FISTULA CREATION;  Surgeon: Sherren Kerns, MD;  Location: [facility];  Service: Vascular;  Laterality: Left;    Family History  Problem Relation Age of Onset  . Cancer Mother     Kidney  . Kidney disease Mother   . Cancer Father     skin     Social History:  reports that she quit smoking about 17 years ago. Her smoking use included Cigarettes. She has a 40 pack-year smoking history. She has quit using smokeless tobacco. Her smokeless tobacco use included Snuff. She reports that she does not drink alcohol or use illicit drugs.  Allergies  Allergen Reactions  . Ivp Dye [Iodinated Diagnostic Agents] Hives and Rash  . Aspirin Hives    MEDICATIONS:                                                                                                                     Scheduled: . amLODipine  5 mg Oral Daily  . darbepoetin (ARANESP) injection - NON-DIALYSIS  60 mcg Subcutaneous Q Tue-1800  . enoxaparin (LOVENOX) injection  30 mg Subcutaneous Q24H  . HYDROmorphone PCA 0.3 mg/mL   Intravenous 6 times per day  .  insulin aspart  0-15 Units Subcutaneous TID WC  . lanthanum  500 mg Oral TID WC  . piperacillin-tazobactam (ZOSYN)  IV  2.25 g Intravenous 3 times per day  . polyethylene glycol  17 g Oral Daily  . sodium chloride  3 mL Intravenous Q12H   Continuous:  VWU:JWJXBJ chloride, diphenhydrAMINE, diphenhydrAMINE, hydrALAZINE, naloxone, ondansetron (ZOFRAN) IV, oxyCODONE, oxyCODONE-acetaminophen, sodium chloride, sodium chloride   ROS:                                                                                                                                       History obtained from son  General ROS: negative for - chills, fatigue, fever, night sweats, weight gain or weight loss Psychological ROS: negative for - behavioral disorder, hallucinations, memory difficulties, mood swings or suicidal ideation Ophthalmic ROS: negative  for - blurry vision, double vision, eye pain or loss of vision ENT ROS: negative for - epistaxis, nasal discharge, oral lesions, sore throat, tinnitus or vertigo Allergy and Immunology ROS: negative for - hives or itchy/watery eyes Hematological and Lymphatic ROS: negative for - bleeding problems, bruising or swollen lymph nodes Endocrine ROS: negative for - galactorrhea, hair pattern changes, polydipsia/polyuria or temperature intolerance Respiratory ROS: negative for - cough, hemoptysis, shortness of breath or wheezing Cardiovascular ROS: negative for - chest pain, dyspnea on exertion, edema or irregular heartbeat Gastrointestinal ROS: negative for - abdominal pain, diarrhea, hematemesis, nausea/vomiting or stool incontinence Genito-Urinary ROS: negative for - dysuria, hematuria, incontinence or urinary frequency/urgency Musculoskeletal ROS: negative for - joint swelling or muscular weakness Neurological ROS: as noted in HPI Dermatological ROS: negative for rash and skin lesion changes   Blood pressure 146/59, pulse 60, temperature 98 F (36.7 C), temperature source  Oral, resp. rate 18, height 5\' 3"  (1.6 m), weight 69.8 kg (153 lb 14.1 oz), SpO2 98.00%.   Neurologic Examination:                                                                                                      Mental Status: Alert, oriented hospital, month, year.  Speech fluent without evidence of aphasia.  Able to follow 3 step commands without difficulty. Cranial Nerves: II: Discs flat bilaterally; Visual fields blinks to threat but unable to count my fingers, inconsistant with telling when light placed in visual field, unable to track my finger, pupils equal, round, reactive to light and accommodation III,IV, VI: ptosis not present, extra-ocular motions intact bilaterally V,VII: smile symmetric, with slight right NL fold decrease at rest,  facial light touch sensation stated to be decreased on the left lower face VIII: hearing normal bilaterally IX,X: gag reflex present XI: bilateral shoulder shrug XII: midline tongue extension without atrophy or fasciculations  Motor: Moving all extremities 4/5 strength, again inconsistent, patient states she is not able to move her left arm but when distracted she is able to lift her left arm antigravity and then give 4/5 resistance.  Sensory: Pinprick and light touch intact throughout, bilaterally Deep Tendon Reflexes:  Right: Upper Extremity   Left: Upper extremity   biceps (C-5 to C-6) 2/4   biceps (C-5 to C-6) 2/4 tricep (C7) 2/4    triceps (C7) 2/4 Brachioradialis (C6) 2/4  Brachioradialis (C6) 2/4  Lower Extremity Lower Extremity  quadriceps (L-2 to L-4) 2/4   quadriceps (L-2 to L-4) 2/4 Achilles (S1) 0/4   Achilles (S1) /04  Plantars: Mute bilaterally Cerebellar: Slight ataxia finger-to-nose,  normal heel-to-shin test Gait: not tested  CV: pulses palpable throughout    Lab Results: Basic Metabolic Panel:  Recent Labs Lab 01/01/14 1522 01/02/14 1903 01/03/14 0548 01/03/14 1329 01/04/14 0550 01/05/14 0307  NA 138 134*  136*  --  133* 132*  K 4.5 4.8 5.0  --  5.5* 5.3  CL 99 96 98  --  95* 93*  CO2 18* 11* 18*  --  15* 16*  GLUCOSE 110* 138* 109*  --  161* 94  BUN 93* 95* 98*  --  104* 105*  CREATININE 5.62* 5.64* 5.94* 5.90* 6.03* 6.65*  CALCIUM 9.0 9.1 8.9  --  9.1 8.5  PHOS 6.2* 6.9* 6.9*  --  7.8* 7.1*    Liver Function Tests:  Recent Labs Lab 12/31/13 1946 01/01/14 1522 01/02/14 1903 01/03/14 0548 01/04/14 0550 01/05/14 0307  AST 12 15  --   --   --   --   ALT 13 11  --   --   --   --   ALKPHOS 100 84  --   --   --   --   BILITOT 0.4 0.3  --   --   --   --   PROT 7.5 6.6  --   --   --   --   ALBUMIN 2.6* 2.2* 2.1* 2.1* 2.0* 2.0*   No results found for this basename: LIPASE, AMYLASE,  in the last 168 hours No results found for this basename: AMMONIA,  in the last 168 hours  CBC:  Recent Labs Lab 12/31/13 1946 01/01/14 1522 01/03/14 0548 01/03/14 1329 01/04/14 0550  WBC 14.2* 11.9* 13.4* 15.0* 14.1*  NEUTROABS 12.8*  --   --   --   --   HGB 11.1* 9.7* 9.4* 10.0* 9.7*  HCT 33.8* 29.1* 29.0* 30.9* 29.6*  MCV 81.4 81.1 82.6 82.2 81.3  PLT 224 204 210 240 246    Cardiac Enzymes: No results found for this basename: CKTOTAL, CKMB, CKMBINDEX, TROPONINI,  in the last 168 hours  Lipid Panel: No results found for this basename: CHOL, TRIG, HDL, CHOLHDL, VLDL, LDLCALC,  in the last 168 hours  CBG:  Recent Labs Lab 01/04/14 0620 01/04/14 1109 01/04/14 1608 01/04/14 2107 01/05/14 0610  GLUCAP 182* 181* 136* 101* 91    Microbiology: Results for orders placed during the hospital encounter of 08/17/13  MRSA PCR SCREENING     Status: None   Collection Time    08/17/13  7:05 PM      Result Value Ref Range Status   MRSA by PCR NEGATIVE  NEGATIVE Final   Comment:            The GeneXpert MRSA Assay (FDA     approved for NASAL specimens     only), is one component of a     comprehensive MRSA colonization     surveillance program. It is not     intended to diagnose MRSA      infection nor to guide or     monitor treatment for     MRSA infections.    Coagulation Studies:  Recent Labs  01/02/14 1903 01/03/14 0548 01/04/14 0550  LABPROT 18.2* 17.9* 15.9*  INR 1.55* 1.52* 1.30    Imaging: Mr Brain Wo Contrast  01/03/2014   CLINICAL DATA:  Left arm numbness. Evaluate for stroke versus related to recent surgical procedure.  EXAM: MRI HEAD WITHOUT CONTRAST  TECHNIQUE: Multiplanar, multiecho pulse sequences of the brain and surrounding structures were obtained without intravenous contrast.  COMPARISON:  None.  FINDINGS: The patient was unable to remain motionless for the exam. Small or subtle lesions could be overlooked.  No acute stroke is evident. There is generalized atrophy with moderate chronic microvascular ischemic change. Remote right pontine and right basal ganglia lacunes are observed. Diffuse marrow signal abnormality consistent with chronic anemia/chronic renal disease. Cervical spondylosis. Left C1-C2 joint effusion, likely degenerative. Mild pannus.  IMPRESSION: Atrophy.  Chronic microvascular ischemic change.  Remote areas of ischemia without visible acute infarction. No underlying abnormality is seen which might contribute to left arm numbness.   Electronically Signed   By: Davonna Belling M.D.   On: 01/03/2014 17:57     Assessment and plan per attending neurologist  Felicie Morn PA-C [facility] [phone]  01/05/2014, 10:08 AM   Assessment/Plan:  73 year old female with mild confusion, intermittently with problems with answering questions about vision. In the setting of her renal failure, she would be at higher risk for posterior reversible encephalopathy syndrome. It also could be that her confusion is related to uremia. At this time I think an MRI would be useful, if negative then I would attribute these changes to uremia.  1) MRI of the brain  Ritta Slot, MD [facility] [phone]  If 7pm- 7am, please  page neurology on call as listed in AMION.

## 2014-01-05 NOTE — Progress Notes (Signed)
Patient ID: [name], female   DOB: 1941-06-16, 73 y.o.   MRN: [id number] Discuss situation with the patient's daughter today. Patient is having some discomfort in the right foot but not severe. Most of the time she does not complain of any pain.  On examination there is some mottling of the right first toe but no proximal cellulitis or proximal skin changes.  calf nontender. difficult to palpate femoral pulses.  Patient to begin hemodialysis today. We'll not plan amputation of right leg until next week. We'll likely the right AKA. Patient's daughter is in agreement with this. Patient to be transferred to hospitalist service today and we will follow over weekend and determine timing of right leg amputation

## 2014-01-05 NOTE — Progress Notes (Signed)
   VASCULAR PROGRESS NOTE  SUBJECTIVE: confused of this a.m. Yesterday at 5:30 PM she denied any pain in the right foot. Subsequently, at approximately 11 PM she was complaining of severe pain in the right foot. A PCA pump was ordered although she never used to this. This morning she denies pain in the right foot. However, she is confused and is not oriented to place for time. In addition, she appears to have right upper extremity weakness and for this reason a code stroke was called.  PHYSICAL EXAM: Filed Vitals:   01/04/14 2018 01/05/14 0118 01/05/14 0233 01/05/14 0609  BP: 141/64   146/59  Pulse: 61   60  Temp: 97.7 F (36.5 C)   98 F (36.7 C)  TempSrc: Oral   Oral  Resp: 18 18 18 18   Height:      Weight:      SpO2: 99% 98% 97% 98%   On my exam, 0 on a 5 strength in the right upper extremity. Ischemia in the right foot is progressing. She now has decreased motor function which could be secondary to ischemia or could be secondary to a stroke. She does have a reasonable thrill in her left upper arm AV fistula. No hematoma in the left groin where she had catheterization.  LABS: Lab Results  Component Value Date   WBC 14.1* 01/04/2014   HGB 9.7* 01/04/2014   HCT 29.6* 01/04/2014   MCV 81.3 01/04/2014   PLT 246 01/04/2014   Lab Results  Component Value Date   CREATININE 6.65* 01/05/2014   Lab Results  Component Value Date   INR 1.30 01/04/2014   CBG (last 3)   Recent Labs  01/04/14 1109 01/04/14 1608 01/04/14 2107  GLUCAP 181* 136* 101*    Principal Problem:   Atherosclerotic PVD with ulceration Active Problems:   Chronic systolic heart failure   CKD (chronic kidney disease), stage IV   COPD (chronic obstructive pulmonary disease)   Essential hypertension   DM type 2, uncontrolled, with renal complications   ASSESSMENT AND PLAN:  * ISCHEMIC RIGHT FOOT: The ischemia of her right foot is progressing. Based on her arteriogram she has diffuse multilevel arterial  occlusive disease with no options for revascularization. When she is medically stable she should undergo right below-the-knee amputation or above-the-knee amputation.  *  MENTAL STATUS CHANGE: In addition she has some right-sided weakness. A code stroke was called.  * STAGE V CHRONIC KIDNEY DISEASE: Creatinine is relatively stable today. Nephrology is following. They feel that she will likely need dialysis soon. If she does require dialysis I think it would be reasonable to attempt to cannulate her fistula which was placed on 09/07/2013.  * This patient is followed by the Hospitalist. However, it looks like she still on the vascular surgery service. Given her multiple medical issues including heart failure, stage V chronic kidney disease, COPD, type 2 diabetes, hypertension, coronary artery disease, and now a new onset mental status change, if Medicine is agreeable I would prefer to have her on their service.  Cari Caraway Beeper: [phone] 01/05/2014

## 2014-01-05 NOTE — Progress Notes (Signed)
Patient ID: [name], female   DOB: 09/22/1941, 73 y.o.   MRN: [id number]   [facility] Progress Note    Assessment/ Plan:   1. Chronic kidney disease stage V with contrast exposure: Patient was really on the border prior to exposure and now simply by numbers getting worse, possible worsening of confusion- I think that we need to go ahead and initiate dialysis therapy.  The patient and daughter are agreeable so will proceed with first HD today.   Start procedure for search of OP HD unit as well, will likely need placement after hosp.  Has AVF- will try with 17 gauge needles 2. Right hallux ischemia: unfortunately no good options for revasc- I am afraid this is heading to amputation.  Pain is severe, at times better controlled than others.  On broad-spectrum antibiotic therapy with Zosyn for associated cellulitis.  3. Anemia: Hemoglobin falling-she is status post intravenous iron therapy for low Tsat . I have  started aranesp as well 4. Hypertension: on norvasc ,-likely to be aggravated by pain/fluids. - dialysis will help  5. Metabolic bone disease: On  fosrenol- was previously on calcitriol, will change to hectorol once starts on HD and follow PTH 6. Possible steal- hand warm and no pain today.  Will follow clinically     Subjective:   Pain in leg, now on PCA- will eventually need BKA/AKA-  have spoken to daughter who is POA (a little challenging) and she is Ok with us proceeding with HD- pt has confusion waxes and wanes-    Objective:   BP 146/59  Pulse 60  Temp(Src) 98 F (36.7 C) (Oral)  Resp 18  Ht 5\' 3"  (1.6 m)  Wt 69.8 kg (153 lb 14.1 oz)  BMI 27.27 kg/m2  SpO2 98%  Intake/Output Summary (Last 24 hours) at 01/05/14 [id number] Last data filed at 01/04/14 1825  Gross per 24 hour  Intake    440 ml  Output      0 ml  Net    440 ml   Weight change:   Physical Exam: Gen: Comfortably resting in bed CVS: Pulse regular in rate and rhythm, S1 and S2 with an ejection  systolic murmur Resp: Clear to auscultation bilaterally, no rales/rhonchi Abd: Soft, flat, nontender Ext:  ischemic changes- really throughout foot. One plus edema. Left upper arm AVF- patent but soft  Imaging: Mr Brain Wo Contrast  01/03/2014   CLINICAL DATA:  Left arm numbness. Evaluate for stroke versus related to recent surgical procedure.  EXAM: MRI HEAD WITHOUT CONTRAST  TECHNIQUE: Multiplanar, multiecho pulse sequences of the brain and surrounding structures were obtained without intravenous contrast.  COMPARISON:  None.  FINDINGS: The patient was unable to remain motionless for the exam. Small or subtle lesions could be overlooked.  No acute stroke is evident. There is generalized atrophy with moderate chronic microvascular ischemic change. Remote right pontine and right basal ganglia lacunes are observed. Diffuse marrow signal abnormality consistent with chronic anemia/chronic renal disease. Cervical spondylosis. Left C1-C2 joint effusion, likely degenerative. Mild pannus.  IMPRESSION: Atrophy.  Chronic microvascular ischemic change.  Remote areas of ischemia without visible acute infarction. No underlying abnormality is seen which might contribute to left arm numbness.   Electronically Signed   By: Davonna BellingJohn  Curnes M.D.   On: 01/03/2014 17:57    Labs: BMET  Recent Labs Lab 12/31/13 1946 01/01/14 1522 01/02/14 1903 01/03/14 0548 01/03/14 1329 01/04/14 0550 01/05/14 0307  NA 137 138 134* 136*  --  133* 132*  K 4.6 4.5 4.8 5.0  --  5.5* 5.3  CL 97 99 96 98  --  95* 93*  CO2 18* 18* 11* 18*  --  15* 16*  GLUCOSE 213* 110* 138* 109*  --  161* 94  BUN 92* 93* 95* 98*  --  104* 105*  CREATININE 5.11* 5.62* 5.64* 5.94* 5.90* 6.03* 6.65*  CALCIUM 9.6 9.0 9.1 8.9  --  9.1 8.5  PHOS  --  6.2* 6.9* 6.9*  --  7.8* 7.1*   CBC  Recent Labs Lab 12/31/13 1946 01/01/14 1522 01/03/14 0548 01/03/14 1329 01/04/14 0550  WBC 14.2* 11.9* 13.4* 15.0* 14.1*  NEUTROABS 12.8*  --   --   --   --    HGB 11.1* 9.7* 9.4* 10.0* 9.7*  HCT 33.8* 29.1* 29.0* 30.9* 29.6*  MCV 81.4 81.1 82.6 82.2 81.3  PLT 224 204 210 240 246   Medications:    . amLODipine  5 mg Oral Daily  . darbepoetin (ARANESP) injection - NON-DIALYSIS  60 mcg Subcutaneous Q Tue-1800  . enoxaparin (LOVENOX) injection  30 mg Subcutaneous Q24H  . HYDROmorphone PCA 0.3 mg/mL   Intravenous 6 times per day  . insulin aspart  0-15 Units Subcutaneous TID WC  . lanthanum  500 mg Oral TID WC  . piperacillin-tazobactam (ZOSYN)  IV  2.25 g Intravenous 3 times per day  . polyethylene glycol  17 g Oral Daily  . sodium chloride  3 mL Intravenous Q12H   Lillyen Schow A   01/05/2014, 9:22 AM

## 2014-01-06 ENCOUNTER — Inpatient Hospital Stay (HOSPITAL_COMMUNITY): Payer: Medicare Other

## 2014-01-06 LAB — RENAL FUNCTION PANEL
Albumin: 2 g/dL — ABNORMAL LOW (ref 3.5–5.2)
BUN: 82 mg/dL — ABNORMAL HIGH (ref 6–23)
CALCIUM: 8.2 mg/dL — AB (ref 8.4–10.5)
CO2: 18 mEq/L — ABNORMAL LOW (ref 19–32)
CREATININE: 5.67 mg/dL — AB (ref 0.50–1.10)
Chloride: 91 mEq/L — ABNORMAL LOW (ref 96–112)
GFR calc Af Amer: 8 mL/min — ABNORMAL LOW (ref >90)
GFR calc non Af Amer: 7 mL/min — ABNORMAL LOW (ref >90)
GLUCOSE: 102 mg/dL — AB (ref 70–99)
PHOSPHORUS: 5.1 mg/dL — AB (ref 2.3–4.6)
Potassium: 4.3 mEq/L (ref 3.7–5.3)
SODIUM: 130 meq/L — AB (ref 137–147)

## 2014-01-06 LAB — GLUCOSE, CAPILLARY
GLUCOSE-CAPILLARY: 106 mg/dL — AB (ref 70–99)
GLUCOSE-CAPILLARY: 112 mg/dL — AB (ref 70–99)
GLUCOSE-CAPILLARY: 106 mg/dL — AB (ref 70–99)
GLUCOSE-CAPILLARY: 94 mg/dL (ref 70–99)
GLUCOSE-CAPILLARY: 87 mg/dL (ref 70–99)

## 2014-01-06 LAB — HEPATITIS B SURFACE ANTIBODY,QUALITATIVE: Hep B S Ab: NEGATIVE

## 2014-01-06 LAB — HEPATITIS B CORE ANTIBODY, TOTAL: Hep B Core Total Ab: NONREACTIVE

## 2014-01-06 MED ORDER — DOXERCALCIFEROL 4 MCG/2ML IV SOLN
2.0000 ug | INTRAVENOUS | Status: DC
  Filled 2014-01-06: qty 2

## 2014-01-06 MED ORDER — AMLODIPINE BESYLATE 10 MG PO TABS
10.0000 mg | ORAL_TABLET | Freq: Every day | ORAL | Status: DC
  Administered 2014-01-08 – 2014-01-14 (×6): 10 mg via ORAL
  Filled 2014-01-06 (×9): qty 1

## 2014-01-06 MED ORDER — HYDROMORPHONE HCL PF 1 MG/ML IJ SOLN
0.5000 mg | Freq: Once | INTRAMUSCULAR | Status: AC
  Administered 2014-01-06: 0.5 mg via INTRAVENOUS
  Filled 2014-01-06: qty 1

## 2014-01-06 NOTE — Progress Notes (Signed)
Patient ID: Rhonda EvesMary P [name], female   DOB: 12/28/1940, 73 y.o.   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 No reported fevers on admission, no chest pain or shortness of breath. [facility] consulted for assistance with chronic medical conditions. Main concern raised is management of the right ischemic hallux as iodinated contrast is required, risk being progression to ESRD and requiring HD. The patient has a left upper arm brachiocephalic fistula placed 09/07/2013.   Atherosclerotic PVD with ulceration  - appreciate vascular surgery input, plan for surgery next week.  - no good option for revascularization for now.  Cellulitis of the right foot - continue Zosyn day #5 Left arm numbness - MRI negative for acute events but chronic ischemic changes noted  - better this Am per pt ?Confusion and transient blindness - neurology consulted, appreciate input. Improved today.  Chronic systolic congestive heart failure - per 2 D ECHO 3/22 --> EF 30 - 35 %  - undergoing HD again today per renal.  CKD (chronic kidney disease), stage IV - on HD now. Hyperkalemia- will need HD and will defer to nephrology team  COPD (chronic obstructive pulmonary disease) - clinically compensated, maintaining oxygen saturations at target range  Essential hypertension - continue Imdur, Coreg, Amlodipine  Anemia of chronic disease  - no signs of active bleeding but drop in hg since admission  - aranesp started per nephrology team  - CBC in AM  DM type 2,  uncontrolled, with renal complications  - reasonable inpatient control - A1C (3/21) 8.1  - continue SSI while inpatient  Leukocytosis - secondary to right hallux ischemia and cellulitis  HLD - continue statin as per home medical regimen  Moderate malnutrition - albumin ~2, secondary to progressive nature of the complex medical conditions outlined above   Procedures/Studies:  CXR 01/01/2014 Congestive heart failure pattern which is mildly worsened from prior.  01/03/2014 US-guided access to left common femoral artery, CO2 aortogram, selective catheterization of the right external iliac artery with right lower extremity runoff using CO2  Antibiotics:  Zosyn 3/21 -->   Code Status: Full  Family Communication: Pt and daughter at bedside  Disposition Plan: Home when medically stable, daughter wants to take her home, no SNF; plan for surgery per vascular early next week.   HPI/Subjective: No events overnight, complains of "pain all over" this morning   Objective: Filed Vitals:   01/05/14 1843 01/05/14 2103 01/06/14 0535 01/06/14 0539  BP: 190/74 169/63  172/70  Pulse: 83 77  74  Temp: 97.9 F (36.6 C) 98.7 F (37.1 C)  98.2 F (36.8 C)  TempSrc: Oral Oral  Oral  Resp: 10 12  16   Height:      Weight: 74.9 kg (165 lb 2 oz)  75.2 kg (165 lb 12.6 oz)   SpO2: 96% 92%  94%    Intake/Output Summary (Last 24 hours) at 01/06/14 0803 Last data filed at 01/05/14 2130  Gross per 24 hour  Intake      0 ml  Output    941 ml  Net   -  941 ml   Exam:  General:  Pt is alert, follows commands appropriately, not in acute distress  Cardiovascular: Regular rate and rhythm, S1/S2, no murmurs, no rubs, no gallops  Respiratory: Clear to auscultation bilaterally, no wheezing, no crackles, no rhonchi  Abdomen: Soft, non tender, non distended, bowel sounds present, no guarding  Extremities: Right foot cold and ischemia, edema and tender to palpation   Data Reviewed: Basic Metabolic  Panel:  Recent Labs Lab 01/02/14 1903 01/03/14 0548 01/03/14 1329 01/04/14 0550 01/05/14 0307 01/06/14 0421  NA 134* 136*  --  133* 132* 130*  K 4.8 5.0  --  5.5* 5.3 4.3  CL 96 98  --  95* 93* 91*  CO2 11* 18*  --  15* 16* 18*  GLUCOSE 138* 109*  --  161* 94 102*  BUN 95* 98*  --  104* 105* 82*  CREATININE 5.64* 5.94* 5.90* 6.03* 6.65* 5.67*  CALCIUM 9.1 8.9  --  9.1 8.5 8.2*  PHOS 6.9* 6.9*  --  7.8* 7.1* 5.1*   Liver Function Tests:  Recent Labs Lab 12/31/13 1946 01/01/14 1522 01/02/14 1903 01/03/14 0548 01/04/14 0550 01/05/14 0307 01/06/14 0421  AST 12 15  --   --   --   --   --   ALT 13 11  --   --   --   --   --   ALKPHOS 100 84  --   --   --   --   --   BILITOT 0.4 0.3  --   --   --   --   --   PROT 7.5 6.6  --   --   --   --   --   ALBUMIN 2.6* 2.2* 2.1* 2.1* 2.0* 2.0* 2.0*   CBC:  Recent Labs Lab 12/31/13 1946 01/01/14 1522 01/03/14 0548 01/03/14 1329 01/04/14 0550  WBC 14.2* 11.9* 13.4* 15.0* 14.1*  NEUTROABS 12.8*  --   --   --   --   HGB 11.1* 9.7* 9.4* 10.0* 9.7*  HCT 33.8* 29.1* 29.0* 30.9* 29.6*  MCV 81.4 81.1 82.6 82.2 81.3  PLT 224 204 210 240 246   CBG:  Recent Labs Lab 01/05/14 1128 01/05/14 1926 01/05/14 2114 01/06/14 0143 01/06/14 0614  GLUCAP 100* 98 98 106* 112*   Scheduled Meds: . amLODipine  5 mg Oral Daily  . darbepoetin (ARANESP) injection - NON-DIALYSIS  60 mcg Subcutaneous Q Tue-1800  . enoxaparin (LOVENOX) injection  30 mg Subcutaneous Q24H  .  HYDROmorphone (DILAUDID) injection  0.5 mg Intravenous Once  . insulin aspart  0-15 Units Subcutaneous TID WC  . lanthanum  500 mg Oral TID WC  . piperacillin-tazobactam (ZOSYN)  IV  2.25 g Intravenous 3 times per day  . polyethylene glycol  17 g Oral Daily  . sodium chloride  3 mL Intravenous Q12H   Continuous Infusions:     Pamella Pert, MD  Pager [phone] Time spent: 35 minutes.  If 7PM-7AM, please contact night-coverage [email or web address] Password  [email or web address] 01/06/2014, 8:03 AM   LOS: 6 days

## 2014-01-06 NOTE — Progress Notes (Signed)
Patient ID: [name], female   DOB: 06-02-1941, 73 y.o.   MRN: [id number]   [facility] Progress Note    Assessment/ Plan:   1. Chronic kidney disease stage V with contrast exposure: Now ESRD-  initiated dialysis therapy this admit.  Plan is for second HD tomorrow and third on Saturday.   Start procedure for search of OP HD unit as well, will likely need placement after hosp.  Has AVF- so far successful with 17 gauge needles 2. Right hallux ischemia: unfortunately no good options for revasc- I am afraid this is heading to amputation.  Pain is severe, at times better controlled others.  On broad-spectrum antibiotic therapy with Zosyn for associated cellulitis.  3. Anemia: Hemoglobin falling-she is status post intravenous iron therapy for low Tsat . I have  started aranesp as well 4. Hypertension: on norvasc ,-likely to be aggravated by pain/fluids. - dialysis will help - will increase norvasc today also  5. Metabolic bone disease: On  fosrenol-  hectorol with HD and follow PTH 6. Possible steal- hand warm and no pain today.  Will follow clinically     Subjective:   Pt has been more confused, BP high- first dialysis treatment went pretty well. C/O pain all over   Objective:   BP 172/70  Pulse 74  Temp(Src) 98.2 F (36.8 C) (Oral)  Resp 16  Ht 5\' 3"  (1.6 m)  Wt 75.2 kg (165 lb 12.6 oz)  BMI 29.38 kg/m2  SpO2 94%  Intake/Output Summary (Last 24 hours) at 01/06/14 0916 Last data filed at 01/05/14 2130  Gross per 24 hour  Intake      0 ml  Output    941 ml  Net   -941 ml   Weight change:   Physical Exam: Gen: Comfortably resting in bed CVS: Pulse regular in rate and rhythm, S1 and S2 with an ejection systolic murmur Resp: Clear to auscultation bilaterally, no rales/rhonchi Abd: Soft, flat, nontender Ext:  ischemic changes- really throughout foot. Positive edema. Left upper arm AVF- patent but soft  Imaging: Ct Head Wo Contrast  01/05/2014   CLINICAL DATA:   Decreased mental status.  Left hand numbness.  EXAM: CT HEAD WITHOUT CONTRAST  TECHNIQUE: Contiguous axial images were obtained from the base of the skull through the vertex without intravenous contrast.  COMPARISON:  MR HEAD W/O CM dated 01/03/2014; DG ANG/EXT/UNI/OR*L* dated 06/26/2011  FINDINGS: There is no evidence of acute intracranial hemorrhage, mass lesion, brain edema or extra-axial fluid collection. The ventricles and subarachnoid spaces are appropriately sized for age. There is no CT evidence of acute cortical infarction. Mild periventricular white matter disease appears stable. There diffuse intracranial vascular calcifications.  The visualized paranasal sinuses, mastoid air cells and middle ears are clear. The calvarium is intact.  IMPRESSION: Stable appearance of the brain.  No acute intracranial findings.   Electronically Signed   By: Roxy Horseman M.D.   On: 01/05/2014 12:58    Labs: BMET  Recent Labs Lab 12/31/13 1946 01/01/14 1522 01/02/14 1903 01/03/14 0548 01/03/14 1329 01/04/14 0550 01/05/14 0307 01/06/14 0421  NA 137 138 134* 136*  --  133* 132* 130*  K 4.6 4.5 4.8 5.0  --  5.5* 5.3 4.3  CL 97 99 96 98  --  95* 93* 91*  CO2 18* 18* 11* 18*  --  15* 16* 18*  GLUCOSE 213* 110* 138* 109*  --  161* 94 102*  BUN 92* 93* 95* 98*  --  104* 105* 82*  CREATININE 5.11* 5.62* 5.64* 5.94* 5.90* 6.03* 6.65* 5.67*  CALCIUM 9.6 9.0 9.1 8.9  --  9.1 8.5 8.2*  PHOS  --  6.2* 6.9* 6.9*  --  7.8* 7.1* 5.1*   CBC  Recent Labs Lab 12/31/13 1946 01/01/14 1522 01/03/14 0548 01/03/14 1329 01/04/14 0550  WBC 14.2* 11.9* 13.4* 15.0* 14.1*  NEUTROABS 12.8*  --   --   --   --   HGB 11.1* 9.7* 9.4* 10.0* 9.7*  HCT 33.8* 29.1* 29.0* 30.9* 29.6*  MCV 81.4 81.1 82.6 82.2 81.3  PLT 224 204 210 240 246   Medications:    . amLODipine  5 mg Oral Daily  . darbepoetin (ARANESP) injection - NON-DIALYSIS  60 mcg Subcutaneous Q Tue-1800  . enoxaparin (LOVENOX) injection  30 mg Subcutaneous  Q24H  . insulin aspart  0-15 Units Subcutaneous TID WC  . lanthanum  500 mg Oral TID WC  . piperacillin-tazobactam (ZOSYN)  IV  2.25 g Intravenous 3 times per day  . polyethylene glycol  17 g Oral Daily  . sodium chloride  3 mL Intravenous Q12H   Erle Guster A   01/06/2014, 9:16 AM

## 2014-01-06 NOTE — Progress Notes (Signed)
NEURO HOSPITALIST PROGRESS NOTE   SUBJECTIVE:                                                                                                                        Patient resting comfortably in room, states she is feeling well, states her vision is "bad".  When asked to tell me further about her vision she stated " I do not want to" and would give no further information. Initially would follow my commands but as the exam continued she would not participate.   OBJECTIVE:                                                                                                                           Vital signs in last 24 hours: Temp:  [97.4 F (36.3 C)-98.7 F (37.1 C)] 98.2 F (36.8 C) (03/26 0539) Pulse Rate:  [64-83] 74 (03/26 0539) Resp:  [10-16] 16 (03/26 0539) BP: (148-190)/(63-80) 172/70 mmHg (03/26 0539) SpO2:  [92 %-97 %] 94 % (03/26 0539) Weight:  [74.9 kg (165 lb 2 oz)-75.3 kg (166 lb 0.1 oz)] 75.2 kg (165 lb 12.6 oz) (03/26 0535)  Intake/Output from previous day: 03/25 0701 - 03/26 0700 In: 240 [P.O.:240] Out: 941 [Urine:350] Intake/Output this shift:   Nutritional status: Diabetic  Past Medical History  Diagnosis Date  . CAD (coronary artery disease)   . Hypertension   . Diabetes mellitus     type 2 insulin dependent  . Hyperlipidemia   . CHF (congestive heart failure)   . Cellulitis     left foot  . Ulcer     diabetic ulcer on left foot  . DVT (deep venous thrombosis)     peroneal vein  . Peripheral vascular disease   . Chronic kidney disease   . Anemia   . Shortness of breath     with fluid  . Insomnia      Neurologic Exam:  Mental Status: Alert, oriented.  Speech fluent without evidence of aphasia.  Able to follow commands without difficulty when desires to take part in exam. Cranial Nerves: II:  Visual fields --states she cannot see my fingers but will track my fingers, blink to threat and wince when light is shown  in  her eyes. , pupils equal, round, reactive to light and accommodation III,IV, VI: ptosis not present, extra-ocular motions intact bilaterally V,VII: smile symmetric, facial light touch sensation normal bilaterally VIII: hearing normal bilaterally IX,X: gag reflex present XI: bilateral shoulder shrug XII: midline tongue extension without atrophy or fasciculations  Motor: 4/5 bilaterally  Will not take part in formal exam but when distracted shows good  Strength.  Sensory: Pinprick and light touch intact throughout, bilaterally Deep Tendon Reflexes:  Right: Upper Extremity   Left: Upper extremity   biceps (C-5 to C-6) 2/4   biceps (C-5 to C-6) 2/4 tricep (C7) 2/4    triceps (C7) 2/4 Brachioradialis (C6) 2/4  Brachioradialis (C6) 2/4  Lower Extremity Lower Extremity  quadriceps (L-2 to L-4) 2/4   quadriceps (L-2 to L-4) 2/4 Achilles (S1) 0/4   Achilles (S1) 0/4  Plantars: Mute bilaterally t    Lab Results: Basic Metabolic Panel:  Recent Labs Lab 01/02/14 1903 01/03/14 0548 01/03/14 1329 01/04/14 0550 01/05/14 0307 01/06/14 0421  NA 134* 136*  --  133* 132* 130*  K 4.8 5.0  --  5.5* 5.3 4.3  CL 96 98  --  95* 93* 91*  CO2 11* 18*  --  15* 16* 18*  GLUCOSE 138* 109*  --  161* 94 102*  BUN 95* 98*  --  104* 105* 82*  CREATININE 5.64* 5.94* 5.90* 6.03* 6.65* 5.67*  CALCIUM 9.1 8.9  --  9.1 8.5 8.2*  PHOS 6.9* 6.9*  --  7.8* 7.1* 5.1*    Liver Function Tests:  Recent Labs Lab 12/31/13 1946 01/01/14 1522 01/02/14 1903 01/03/14 0548 01/04/14 0550 01/05/14 0307 01/06/14 0421  AST 12 15  --   --   --   --   --   ALT 13 11  --   --   --   --   --   ALKPHOS 100 84  --   --   --   --   --   BILITOT 0.4 0.3  --   --   --   --   --   PROT 7.5 6.6  --   --   --   --   --   ALBUMIN 2.6* 2.2* 2.1* 2.1* 2.0* 2.0* 2.0*   No results found for this basename: LIPASE, AMYLASE,  in the last 168 hours No results found for this basename: AMMONIA,  in the last 168  hours  CBC:  Recent Labs Lab 12/31/13 1946 01/01/14 1522 01/03/14 0548 01/03/14 1329 01/04/14 0550  WBC 14.2* 11.9* 13.4* 15.0* 14.1*  NEUTROABS 12.8*  --   --   --   --   HGB 11.1* 9.7* 9.4* 10.0* 9.7*  HCT 33.8* 29.1* 29.0* 30.9* 29.6*  MCV 81.4 81.1 82.6 82.2 81.3  PLT 224 204 210 240 246    Cardiac Enzymes: No results found for this basename: CKTOTAL, CKMB, CKMBINDEX, TROPONINI,  in the last 168 hours  Lipid Panel: No results found for this basename: CHOL, TRIG, HDL, CHOLHDL, VLDL, LDLCALC,  in the last 168 hours  CBG:  Recent Labs Lab 01/05/14 1926 01/05/14 2114 01/06/14 0143 01/06/14 0614 01/06/14 1151  GLUCAP 98 98 106* 112* 106*    Microbiology: Results for orders placed during the hospital encounter of 08/17/13  MRSA PCR SCREENING     Status: None   Collection Time    08/17/13  7:05 PM      Result Value Ref Range Status   MRSA by  PCR NEGATIVE  NEGATIVE Final   Comment:            The GeneXpert MRSA Assay (FDA     approved for NASAL specimens     only), is one component of a     comprehensive MRSA colonization     surveillance program. It is not     intended to diagnose MRSA     infection nor to guide or     monitor treatment for     MRSA infections.    Coagulation Studies:  Recent Labs  01/04/14 0550  LABPROT 15.9*  INR 1.30    Imaging: Ct Head Wo Contrast  01/05/2014   CLINICAL DATA:  Decreased mental status.  Left hand numbness.  EXAM: CT HEAD WITHOUT CONTRAST  TECHNIQUE: Contiguous axial images were obtained from the base of the skull through the vertex without intravenous contrast.  COMPARISON:  MR HEAD W/O CM dated 01/03/2014; DG ANG/EXT/UNI/OR*L* dated 06/26/2011  FINDINGS: There is no evidence of acute intracranial hemorrhage, mass lesion, brain edema or extra-axial fluid collection. The ventricles and subarachnoid spaces are appropriately sized for age. There is no CT evidence of acute cortical infarction. Mild periventricular white  matter disease appears stable. There diffuse intracranial vascular calcifications.  The visualized paranasal sinuses, mastoid air cells and middle ears are clear. The calvarium is intact.  IMPRESSION: Stable appearance of the brain.  No acute intracranial findings.   Electronically Signed   By: Roxy HorsemanBill  Veazey M.D.   On: 01/05/2014 12:58       MEDICATIONS                                                                                                                        Scheduled: . [START ON 01/07/2014] amLODipine  10 mg Oral Daily  . darbepoetin (ARANESP) injection - NON-DIALYSIS  60 mcg Subcutaneous Q Tue-1800  . doxercalciferol  2 mcg Intravenous Q T,Th,Sa-HD  . enoxaparin (LOVENOX) injection  30 mg Subcutaneous Q24H  . insulin aspart  0-15 Units Subcutaneous TID WC  . lanthanum  500 mg Oral TID WC  . piperacillin-tazobactam (ZOSYN)  IV  2.25 g Intravenous 3 times per day  . polyethylene glycol  17 g Oral Daily  . sodium chloride  3 mL Intravenous Q12H    ASSESSMENT/PLAN:                                                                                                            73 year old female with mild confusion, stating she is unable  to see however during exam she is able to trak my finger, blink to threat and winces to light. When asked about vision patient will not divulge any information. Sensory exam is very inconsistent and patient refuses to take part in exam. As stated prior, In the setting of her renal failure, she would be at higher risk for posterior reversible encephalopathy syndrome. It also could be that her confusion is related to uremia.   Recommend: 1) MRI brain     Assessment and plan discussed with with attending physician and they are in agreement.    Felicie Morn PA-C [facility] [phone]  01/06/2014, 1:05 PM

## 2014-01-07 LAB — GLUCOSE, CAPILLARY
GLUCOSE-CAPILLARY: 93 mg/dL (ref 70–99)
GLUCOSE-CAPILLARY: 103 mg/dL — AB (ref 70–99)
Glucose-Capillary: 81 mg/dL (ref 70–99)
Glucose-Capillary: 90 mg/dL (ref 70–99)
Glucose-Capillary: 91 mg/dL (ref 70–99)

## 2014-01-07 LAB — RENAL FUNCTION PANEL
ALBUMIN: 1.9 g/dL — AB (ref 3.5–5.2)
BUN: 84 mg/dL — ABNORMAL HIGH (ref 6–23)
CALCIUM: 8.5 mg/dL (ref 8.4–10.5)
CO2: 19 mEq/L (ref 19–32)
Chloride: 96 mEq/L (ref 96–112)
Creatinine, Ser: 6.05 mg/dL — ABNORMAL HIGH (ref 0.50–1.10)
GFR, EST AFRICAN AMERICAN: 7 mL/min — AB (ref >90)
GFR, EST NON AFRICAN AMERICAN: 6 mL/min — AB (ref >90)
GLUCOSE: 82 mg/dL (ref 70–99)
PHOSPHORUS: 5.8 mg/dL — AB (ref 2.3–4.6)
POTASSIUM: 4.4 meq/L (ref 3.7–5.3)
SODIUM: 136 meq/L — AB (ref 137–147)

## 2014-01-07 MED ORDER — DOXERCALCIFEROL 4 MCG/2ML IV SOLN
2.0000 ug | INTRAVENOUS | Status: DC
  Administered 2014-01-07: 2 ug via INTRAVENOUS
  Filled 2014-01-07 (×4): qty 2

## 2014-01-07 MED ORDER — DARBEPOETIN ALFA-POLYSORBATE 60 MCG/0.3ML IJ SOLN: 60.0000 ug | INTRAMUSCULAR | Status: DC

## 2014-01-07 MED ORDER — DOXERCALCIFEROL 4 MCG/2ML IV SOLN
INTRAVENOUS | Status: AC
  Filled 2014-01-07: qty 2

## 2014-01-07 MED ORDER — OXYCODONE HCL 5 MG PO TABS
ORAL_TABLET | ORAL | Status: AC
  Filled 2014-01-07: qty 1

## 2014-01-07 NOTE — Progress Notes (Signed)
Patient ID: [name], female   DOB: 02-Aug-1941, 73 y.o.   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 No reported fevers on admission, no chest pain or shortness of breath. Main concern raised is management of the right ischemic hallux as iodinated contrast is required, risk being progression to ESRD and requiring HD. The patient has a left upper arm brachiocephalic fistula placed 09/07/2013.   Atherosclerotic PVD with ulceration  - plan for surgery next week.  - no good option for revascularization for now.  Cellulitis of the right foot - continue Zosyn day #6, we'll have to continue until surgery Left arm numbness - MRI negative for acute events but chronic ischemic changes noted  - better this Am per pt ?Confusion and transient blindness - neurology consulted, appreciate input. Improved today.  Chronic systolic congestive heart failure - per 2 D ECHO 3/22 --> EF 30 - 35 %  - undergoing HD again today per renal.  CKD (chronic kidney disease), stage IV - on HD now. Hyperkalemia- will need HD and will defer to nephrology team  COPD (chronic obstructive pulmonary disease) - clinically compensated, maintaining oxygen saturations at target range  Essential hypertension - continue Imdur, Coreg, Amlodipine  Anemia of chronic disease  - no signs of active bleeding but drop in hg since admission  - aranesp started per nephrology team  - CBC in AM  DM type 2, uncontrolled, with renal complications  - reasonable inpatient  control - A1C (3/21) 8.1  - continue SSI while inpatient  Leukocytosis - secondary to right hallux ischemia and cellulitis  HLD - continue statin as per home medical regimen  Moderate malnutrition - albumin ~2, secondary to progressive nature of the complex medical conditions outlined above  - Patient encouraged to eat again today  Procedures/Studies:  CXR 01/01/2014 Congestive heart failure pattern which is mildly worsened from prior.  01/03/2014 US-guided access to left common femoral artery, CO2 aortogram, selective catheterization of the right external iliac artery with right lower extremity runoff using CO2  Antibiotics:  Zosyn 3/21 -->   Code Status: Full  Family Communication: Pt and daughter at bedside  Disposition Plan: Home when medically stable, daughter wants to take her home, no SNF; plan for surgery per vascular early next week.   HPI/Subjective: - Patient denies pain this morning, states that she can't see for the past 2 days  Objective: Filed Vitals:   01/06/14 1313 01/06/14 1955 01/07/14 0316 01/07/14 0609  BP: 177/81 169/62 162/72 168/47  Pulse: 77 76 77 78  Temp: 98.2 F (36.8 C) 98.5 F (36.9 C) 98.3 F (36.8 C) 98.6 F (37 C)  TempSrc: Oral Oral Oral Oral  Resp: 17 16 16 16   Height:      Weight:   75.2 kg (165 lb 12.6 oz)   SpO2: 98% 100% 100% 99%    Intake/Output Summary (Last 24 hours) at 01/07/14 1315 Last data filed at 01/07/14 0730  Gross per 24 hour  Intake    100 ml  Output      0  ml  Net    100 ml   Exam:  General:  Pt is alert, follows commands appropriately, not in acute distress  Cardiovascular: Regular rate and rhythm, S1/S2, no murmurs, no rubs, no gallops  Respiratory: Clear to auscultation bilaterally, no wheezing, no crackles, no rhonchi  Abdomen: Soft, non tender, non distended, bowel sounds present, no guarding  Extremities: Right foot cold and ischemia, edema and tender to palpation   Data Reviewed: Basic Metabolic  Panel:  Recent Labs Lab 01/03/14 0548 01/03/14 1329 01/04/14 0550 01/05/14 0307 01/06/14 0421 01/07/14 0320  NA 136*  --  133* 132* 130* 136*  K 5.0  --  5.5* 5.3 4.3 4.4  CL 98  --  95* 93* 91* 96  CO2 18*  --  15* 16* 18* 19  GLUCOSE 109*  --  161* 94 102* 82  BUN 98*  --  104* 105* 82* 84*  CREATININE 5.94* 5.90* 6.03* 6.65* 5.67* 6.05*  CALCIUM 8.9  --  9.1 8.5 8.2* 8.5  PHOS 6.9*  --  7.8* 7.1* 5.1* 5.8*   Liver Function Tests:  Recent Labs Lab 12/31/13 1946 01/01/14 1522  01/03/14 0548 01/04/14 0550 01/05/14 0307 01/06/14 0421 01/07/14 0320  AST 12 15  --   --   --   --   --   --   ALT 13 11  --   --   --   --   --   --   ALKPHOS 100 84  --   --   --   --   --   --   BILITOT 0.4 0.3  --   --   --   --   --   --   PROT 7.5 6.6  --   --   --   --   --   --   ALBUMIN 2.6* 2.2*  < > 2.1* 2.0* 2.0* 2.0* 1.9*  < > = values in this interval not displayed. CBC:  Recent Labs Lab 12/31/13 1946 01/01/14 1522 01/03/14 0548 01/03/14 1329 01/04/14 0550  WBC 14.2* 11.9* 13.4* 15.0* 14.1*  NEUTROABS 12.8*  --   --   --   --   HGB 11.1* 9.7* 9.4* 10.0* 9.7*  HCT 33.8* 29.1* 29.0* 30.9* 29.6*  MCV 81.4 81.1 82.6 82.2 81.3  PLT 224 204 210 240 246   CBG:  Recent Labs Lab 01/06/14 1620 01/06/14 2125 01/07/14 0316 01/07/14 0625 01/07/14 1117  GLUCAP 94 87 81 90 93   Scheduled Meds: . amLODipine  10 mg Oral Daily  . darbepoetin (ARANESP) injection - NON-DIALYSIS  60 mcg Subcutaneous Q Tue-1800  . doxercalciferol  2 mcg Intravenous Q T,Th,Sa-HD  . enoxaparin (LOVENOX) injection  30 mg Subcutaneous Q24H  . insulin aspart  0-15 Units Subcutaneous TID WC  . lanthanum  500 mg Oral TID WC  . piperacillin-tazobactam (ZOSYN)  IV  2.25 g Intravenous 3 times per day  . polyethylene glycol  17 g Oral Daily  . sodium chloride  3 mL Intravenous Q12H   Continuous Infusions:     [name], Prince Olivier, MD  Pager [phone] Time spent: 25 minutes.  If 7PM-7AM, please contact  night-coverage [email or web address] Password [email or web address] 01/07/2014, 1:15 PM   LOS: 7 days

## 2014-01-07 NOTE — Progress Notes (Signed)
Ran 3hr on dialysis, rather then ordered 3hr Tx due to mix up on order date. Tx was stopped when recognized. Removed 1739cc of a 2L goal. Pt tolereated procedure well. Pts fistula was stuck with 17G needles and ran with a DFR of 500 and BFR of 300 without problems

## 2014-01-07 NOTE — Consult Note (Signed)
Reason for Consult: "second opinion" Date: 01/07/14 Location: [facility] Inpatient  [name] is an 73 y.o. female.  HPI: The patient or patient's family contacted the Wound Center today regarding possible amputation and requested nursing staff at Wound Center for second opinion. The patient was previous wound center patient, last seen 08/2013. She is s/p LLE revascularization. There is no family at bedside. The patient reports she has pain in right foot, reports she was ambulatory prior to admission. The remainder of history obtained from chart as patient at times confused.  Past Medical History  Diagnosis Date  . CAD (coronary artery disease)   . Hypertension   . Diabetes mellitus     type 2 insulin dependent  . Hyperlipidemia   . CHF (congestive heart failure)   . Cellulitis     left foot  . Ulcer     diabetic ulcer on left foot  . DVT (deep venous thrombosis)     peroneal vein  . Peripheral vascular disease   . Chronic kidney disease   . Anemia   . Shortness of breath     with fluid  . Insomnia     Past Surgical History  Procedure Laterality Date  . Tubal ligation    . Pr vein bypass graft,aorto-fem-pop  06-26-11    1. PATENT LEFT FEMORAL-POPLITEAL BYPASS GRAFT W/NO EVEIDENCE OF STENOSIS. 2.VELOCITIES OF GREATER THAN 200 CM/'s NOTED ON PREVIOUS EXAM 10/03/11 WERE NOT ADEQUATELY VISULAIZED DURING THIS EXAM  . Coronary artery bypass graft  04/25/08    Dr. Tyrone Sage, CABG x 5   . Nm myoview ltd  01/13/13    LEXISCAN; LV WALL MOTION: LVEF 44%, INFERIOR AKINESIS, ANTEROAPICAL HYPOKINESIS  . Cardiac catheterization  04/22/08    SEVERE 3-VESSEL DISEASE CAD., CABG X 5 04/25/08 WITH DR. [id number]  . Av fistula placement Left 09/07/2013    Procedure: ARTERIOVENOUS (AV) FISTULA CREATION;  Surgeon: Sherren Kerns, MD;  Location: [facility] OR;  Service: Vascular;  Laterality: Left;    Family History  Problem Relation Age of Onset  . Cancer Mother     Kidney  . Kidney disease Mother   . Cancer  Father     skin    Social History:  reports that she quit smoking about 17 years ago. Her smoking use included Cigarettes. She has a 40 pack-year smoking history. She has quit using smokeless tobacco. Her smokeless tobacco use included Snuff. She reports that she does not drink alcohol or use illicit drugs.  Allergies:  Allergies  Allergen Reactions  . Ivp Dye [Iodinated Diagnostic Agents] Hives and Rash  . Aspirin Hives    Medications: I have reviewed the patient's current medications.   ROS Blood pressure 168/47, pulse 78, temperature 98.6 F (37 C), temperature source Oral, resp. rate 16, height 5\' 3"  (1.6 m), weight 75.2 kg (165 lb 12.6 oz), SpO2 99.00%. Physical Exam Dry gangrene, mottling of R foot, no palpable pulses foot, no palpable popliteal, + palpable femoral Mild edema RLE Pt-reports too weak to grip my hands, but I was able to observe her raising both UE during exam  I reviewed patient's studies to date including [name] RLE, ABIs, and angiogram. Occluded SFA distal and AT. Diseased vasculature throughout.   Assessment/Plan: Critical ischemia in patient with non reconstructable disease RLE Agree with plan for amputation. Do not feel conservative debridement and local wound care would be appropriate for this patient at this time.  Glenna Fellows, MD [facility]astic [facility] Reconstructive Surgery [phone]

## 2014-01-07 NOTE — Progress Notes (Signed)
Patient ID: [name], female   DOB: 09-12-1941, 73 y.o.   MRN: [id number]    [facility] Note    Assessment/ Plan:   1. Chronic kidney disease stage V with contrast exposure: Now ESRD-  initiated dialysis therapy this admit.  Plan is for second HD today and third on Saturday.   Start procedure for search of OP HD unit as well, will likely need placement after hosp.  Has AVF- so far successful with 17 gauge needles 2. Right hallux ischemia: unfortunately no good options for revasc- I am afraid this is heading to amputation.  Pain is severe, at times better controlled others.  On broad-spectrum antibiotic therapy with Zosyn for associated cellulitis.  3. Anemia: Hemoglobin falling-she is status post intravenous iron therapy for low Tsat . I have  started aranesp as well 4. Hypertension: on norvasc ,-likely to be aggravated by pain/fluids. - dialysis will help - norvasc has been increased 5. Metabolic bone disease: On  fosrenol-  hectorol with HD and follow PTH 6. Possible steal- hand warm and no pain today.  Will follow clinically     Subjective:   Pt has been more confused, BP high- also says she cant see- MRI neg ?    Objective:   BP 168/47  Pulse 78  Temp(Src) 98.6 F (37 C) (Oral)  Resp 16  Ht 5\' 3"  (1.6 m)  Wt 75.2 kg (165 lb 12.6 oz)  BMI 29.38 kg/m2  SpO2 99%  Intake/Output Summary (Last 24 hours) at 01/07/14 0924 Last data filed at 01/07/14 0730  Gross per 24 hour  Intake    100 ml  Output      0 ml  Net    100 ml   Weight change: -3.5 kg (-7 lb 11.5 oz)  Physical Exam: Gen: Comfortably resting in bed CVS: Pulse regular in rate and rhythm, S1 and S2 with an ejection systolic murmur Resp: Clear to auscultation bilaterally, no rales/rhonchi Abd: Soft, flat, nontender Ext:  ischemic changes- really throughout foot. Positive edema. Left upper arm AVF- patent but soft  Imaging: Ct Head Wo Contrast  01/05/2014   CLINICAL DATA:  Decreased mental  status.  Left hand numbness.  EXAM: CT HEAD WITHOUT CONTRAST  TECHNIQUE: Contiguous axial images were obtained from the base of the skull through the vertex without intravenous contrast.  COMPARISON:  MR HEAD W/O CM dated 01/03/2014; DG ANG/EXT/UNI/OR*L* dated 06/26/2011  FINDINGS: There is no evidence of acute intracranial hemorrhage, mass lesion, brain edema or extra-axial fluid collection. The ventricles and subarachnoid spaces are appropriately sized for age. There is no CT evidence of acute cortical infarction. Mild periventricular white matter disease appears stable. There diffuse intracranial vascular calcifications.  The visualized paranasal sinuses, mastoid air cells and middle ears are clear. The calvarium is intact.  IMPRESSION: Stable appearance of the brain.  No acute intracranial findings.   Electronically Signed   By: Roxy Horseman M.D.   On: 01/05/2014 12:58   Mr Brain Wo Contrast  01/06/2014   CLINICAL DATA:  Evaluate for stroke. Decreased mental status. Left hand numbness.  EXAM: MRI HEAD WITHOUT CONTRAST  TECHNIQUE: Multiplanar, multiecho pulse sequences of the brain and surrounding structures were obtained without intravenous contrast.  COMPARISON:  CT HEAD W/O CM dated 01/05/2014; MR HEAD W/O CM dated 01/03/2014  FINDINGS: No evidence for acute infarction, hemorrhage, mass lesion, hydrocephalus, or extra-axial fluid. Unchanged atrophy with small vessel disease.  No changes suggestive of posterior reversible encephalopathy syndrome.  Post infusion, no abnormal enhancement of the brain or meninges. Low T1 signal intensity bone marrow due to renal disease. Mild pannus. No acute sinus, orbital, or mastoid disease.  IMPRESSION: No acute stroke or changes suggestive of hypertensive encephalopathy.  Atrophy and small vessel disease, stable.   Electronically Signed   By: Davonna BellingJohn  Curnes M.D.   On: 01/06/2014 21:17    Labs: BMET  Recent Labs Lab 01/01/14 1522 01/02/14 1903 01/03/14 0548  01/03/14 1329 01/04/14 0550 01/05/14 0307 01/06/14 0421 01/07/14 0320  NA 138 134* 136*  --  133* 132* 130* 136*  K 4.5 4.8 5.0  --  5.5* 5.3 4.3 4.4  CL 99 96 98  --  95* 93* 91* 96  CO2 18* 11* 18*  --  15* 16* 18* 19  GLUCOSE 110* 138* 109*  --  161* 94 102* 82  BUN 93* 95* 98*  --  104* 105* 82* 84*  CREATININE 5.62* 5.64* 5.94* 5.90* 6.03* 6.65* 5.67* 6.05*  CALCIUM 9.0 9.1 8.9  --  9.1 8.5 8.2* 8.5  PHOS 6.2* 6.9* 6.9*  --  7.8* 7.1* 5.1* 5.8*   CBC  Recent Labs Lab 12/31/13 1946 01/01/14 1522 01/03/14 0548 01/03/14 1329 01/04/14 0550  WBC 14.2* 11.9* 13.4* 15.0* 14.1*  NEUTROABS 12.8*  --   --   --   --   HGB 11.1* 9.7* 9.4* 10.0* 9.7*  HCT 33.8* 29.1* 29.0* 30.9* 29.6*  MCV 81.4 81.1 82.6 82.2 81.3  PLT 224 204 210 240 246   Medications:    . amLODipine  10 mg Oral Daily  . darbepoetin (ARANESP) injection - NON-DIALYSIS  60 mcg Subcutaneous Q Tue-1800  . doxercalciferol  2 mcg Intravenous Q T,Th,Sa-HD  . enoxaparin (LOVENOX) injection  30 mg Subcutaneous Q24H  . insulin aspart  0-15 Units Subcutaneous TID WC  . lanthanum  500 mg Oral TID WC  . piperacillin-tazobactam (ZOSYN)  IV  2.25 g Intravenous 3 times per day  . polyethylene glycol  17 g Oral Daily  . sodium chloride  3 mL Intravenous Q12H   Kamani Lewter A   01/07/2014, 9:24 AM

## 2014-01-07 NOTE — Progress Notes (Signed)
Pt's daughter arrived to unit and I asked her to bring in her documented POA paper since there isn't one on file on the paper chart or in Epic.  Pt's daughter proceeded to write down family references who would claim she was the POA.  I told her these could not replace the legal POA, and requested the document.  Daughter became argumentative and wanted to know who wanted this document.  I informed her that it is part of our assessment when a pt is confused to make sure a POA is in place and the document is in the pt's record.  Daughter verbalized understanding.

## 2014-01-08 LAB — GLUCOSE, CAPILLARY
GLUCOSE-CAPILLARY: 82 mg/dL (ref 70–99)
GLUCOSE-CAPILLARY: 84 mg/dL (ref 70–99)
GLUCOSE-CAPILLARY: 79 mg/dL (ref 70–99)
Glucose-Capillary: 86 mg/dL (ref 70–99)

## 2014-01-08 LAB — RENAL FUNCTION PANEL
Albumin: 2 g/dL — ABNORMAL LOW (ref 3.5–5.2)
BUN: 40 mg/dL — ABNORMAL HIGH (ref 6–23)
CO2: 20 meq/L (ref 19–32)
Calcium: 8.9 mg/dL (ref 8.4–10.5)
Chloride: 94 mEq/L — ABNORMAL LOW (ref 96–112)
Creatinine, Ser: 3.97 mg/dL — ABNORMAL HIGH (ref 0.50–1.10)
GFR calc non Af Amer: 10 mL/min — ABNORMAL LOW (ref >90)
GFR, EST AFRICAN AMERICAN: 12 mL/min — AB (ref >90)
Glucose, Bld: 77 mg/dL (ref 70–99)
PHOSPHORUS: 3.9 mg/dL (ref 2.3–4.6)
POTASSIUM: 3.8 meq/L (ref 3.7–5.3)
SODIUM: 138 meq/L (ref 137–147)

## 2014-01-08 LAB — CBC
HCT: 32.4 % — ABNORMAL LOW (ref 36.0–46.0)
HEMOGLOBIN: 10.5 g/dL — AB (ref 12.0–15.0)
MCH: 26.7 pg (ref 26.0–34.0)
MCHC: 32.4 g/dL (ref 30.0–36.0)
MCV: 82.4 fL (ref 78.0–100.0)
Platelets: 244 10*3/uL (ref 150–400)
RBC: 3.93 MIL/uL (ref 3.87–5.11)
RDW: 16.8 % — ABNORMAL HIGH (ref 11.5–15.5)
WBC: 15.9 10*3/uL — AB (ref 4.0–10.5)

## 2014-01-08 MED ORDER — OXYCODONE-ACETAMINOPHEN 5-325 MG PO TABS
1.0000 | ORAL_TABLET | Freq: Four times a day (QID) | ORAL | Status: DC | PRN
  Administered 2014-01-08 – 2014-01-11 (×7): 1 via ORAL
  Filled 2014-01-08 (×8): qty 1

## 2014-01-08 MED ORDER — LIDOCAINE HCL (CARDIAC) 20 MG/ML IV SOLN
INTRAVENOUS | Status: AC
  Filled 2014-01-08: qty 5

## 2014-01-08 MED ORDER — OXYCODONE HCL 5 MG PO TABS
ORAL_TABLET | ORAL | Status: AC
  Administered 2014-01-08: 5 mg via ORAL
  Filled 2014-01-08: qty 1

## 2014-01-08 MED ORDER — ARTIFICIAL TEARS OP OINT
TOPICAL_OINTMENT | OPHTHALMIC | Status: AC
  Filled 2014-01-08: qty 3.5

## 2014-01-08 MED ORDER — OXYCODONE HCL 5 MG PO TABS
5.0000 mg | ORAL_TABLET | Freq: Four times a day (QID) | ORAL | Status: DC | PRN
  Administered 2014-01-08 – 2014-01-12 (×10): 5 mg via ORAL
  Filled 2014-01-08 (×9): qty 1

## 2014-01-08 NOTE — Progress Notes (Signed)
Patient ID: [name], female   DOB: 07/20/1941, 73 y.o.   MRN: [id number]   [facility] Progress Note    Assessment/ Plan:   1. Chronic kidney disease stage V with contrast exposure: Now ESRD-  initiated dialysis therapy this admit.  Plan is for third HD today.   Start procedure for search of OP HD unit as well, will likely need placement after hosp.  Has AVF- so far successful with 17 gauge needles 2. Right hallux ischemia: unfortunately no good options for revasc.  Pain is severe, at times better controlled others.  On broad-spectrum antibiotic therapy with Zosyn for associated cellulitis. Will need amputation , likely AKA , next week 3. Anemia: Hemoglobin falling-she is status post intravenous iron therapy for low Tsat . I have started aranesp as well 4. Hypertension: on norvasc ,-likely to be aggravated by pain/fluids. - dialysis has helped - norvasc has been increased- UF with HD 5. Metabolic bone disease: On  fosrenol-  hectorol with HD and follow PTH 6. Possible steal- hand warm and no pain today.  Will follow clinically     Subjective:   Pt seems [name] little brighter- son is here, initially he was refusing therapy for her but now seems agreeable.  Pt tolerated her second treatment yesterday    Objective:   BP 166/76  Pulse 94  Temp(Src) 98.8 F (37.1 C) (Oral)  Resp 16  Ht 5\' 3"  (1.6 m)  Wt 73.1 kg (161 lb 2.5 oz)  BMI 28.55 kg/m2  SpO2 94%  Intake/Output Summary (Last 24 hours) at 01/08/14 0802 Last data filed at 01/07/14 2200  Gross per 24 hour  Intake    0.5 ml  Output   1739 ml  Net -1738.5 ml   Weight change: -0.2 kg (-7.1 oz)  Physical Exam: Gen: Comfortably resting in bed CVS: Pulse regular in rate and rhythm, S1 and S2 with an ejection systolic murmur Resp: Clear to auscultation bilaterally, no rales/rhonchi Abd: Soft, flat, nontender Ext:  ischemic changes- really throughout foot. Positive edema. Left upper arm AVF- patent but  soft  Imaging: Mr Brain Wo Contrast  01/06/2014   CLINICAL DATA:  Evaluate for stroke. Decreased mental status. Left hand numbness.  EXAM: MRI HEAD WITHOUT CONTRAST  TECHNIQUE: Multiplanar, multiecho pulse sequences of the brain and surrounding structures were obtained without intravenous contrast.  COMPARISON:  CT HEAD W/O CM dated 01/05/2014; MR HEAD W/O CM dated 01/03/2014  FINDINGS: No evidence for acute infarction, hemorrhage, mass lesion, hydrocephalus, or extra-axial fluid. Unchanged atrophy with small vessel disease.  No changes suggestive of posterior reversible encephalopathy syndrome.  Post infusion, no abnormal enhancement of the brain or meninges. Low T1 signal intensity bone marrow due to renal disease. Mild pannus. No acute sinus, orbital, or mastoid disease.  IMPRESSION: No acute stroke or changes suggestive of hypertensive encephalopathy.  Atrophy and small vessel disease, stable.   Electronically Signed   By: Davonna BellingJohn  Curnes M.D.   On: 01/06/2014 21:17    Labs: BMET  Recent Labs Lab 01/02/14 1903 01/03/14 0548 01/03/14 1329 01/04/14 0550 01/05/14 0307 01/06/14 0421 01/07/14 0320 01/08/14 0353  NA 134* 136*  --  133* 132* 130* 136* 138  K 4.8 5.0  --  5.5* 5.3 4.3 4.4 3.8  CL 96 98  --  95* 93* 91* 96 94*  CO2 11* 18*  --  15* 16* 18* 19 20  GLUCOSE 138* 109*  --  161* 94 102* 82 77  BUN  95* 98*  --  104* 105* 82* 84* 40*  CREATININE 5.64* 5.94* 5.90* 6.03* 6.65* 5.67* 6.05* 3.97*  CALCIUM 9.1 8.9  --  9.1 8.5 8.2* 8.5 8.9  PHOS 6.9* 6.9*  --  7.8* 7.1* 5.1* 5.8* 3.9   CBC  Recent Labs Lab 01/03/14 0548 01/03/14 1329 01/04/14 0550 01/08/14 0353  WBC 13.4* 15.0* 14.1* 15.9*  HGB 9.4* 10.0* 9.7* 10.5*  HCT 29.0* 30.9* 29.6* 32.4*  MCV 82.6 82.2 81.3 82.4  PLT 210 240 246 244   Medications:    . amLODipine  10 mg Oral Daily  . [START ON 01/12/2014] darbepoetin (ARANESP) injection - DIALYSIS  60 mcg Intravenous Q Wed-HD  . doxercalciferol      . doxercalciferol   2 mcg Intravenous Q M,W,F-HD  . enoxaparin (LOVENOX) injection  30 mg Subcutaneous Q24H  . insulin aspart  0-15 Units Subcutaneous TID WC  . lanthanum  500 mg Oral TID WC  . piperacillin-tazobactam (ZOSYN)  IV  2.25 g Intravenous 3 times per day  . polyethylene glycol  17 g Oral Daily  . sodium chloride  3 mL Intravenous Q12H   [name]   01/08/2014, 8:02 AM

## 2014-01-08 NOTE — Progress Notes (Addendum)
[facility] [place]  Subjective  - Oldest son in room.  He states he will sign the consent for the amputation today to plan for next week surgery.  She has not eaten in the past 2 days.  I asked her to eat so her body can heal after surgery.   Objective 166/76 94 98.8 F (37.1 C) (Oral) 16 94%  Intake/Output Summary (Last 24 hours) at 01/08/14 0754 Last data filed at 01/07/14 2200  Gross per 24 hour  Intake    0.5 ml  Output   1739 ml  Net -1738.5 ml    Right great toe cellulitis Ischemia in the right foot is progressing.  Assessment/Planning: Based on the arteriogram there really are no good options for revascularization. We will switch her from IV heparin to Lovenox, and follow her exam. If her ischemia in the right foot progresses she will need an amputation . Consent will be signed by her son today.  Changed pain medication frequency and scheduled instead of PRN    Clinton Gallant [facility] 01/08/2014 7:54 AM -- 2nd opinion yesterday agrees with amputation AKA scheduled with Dr Hart [place] next Wednesday.  Call if questions. Will need consent from daughter POA  Fabienne Bruns, MD [facility] [place] Office: [phone] Pager: [phone]  Laboratory Lab Results:  Recent Labs  01/08/14 0353  WBC 15.9*  HGB 10.5*  HCT 32.4*  PLT 244   BMET  Recent Labs  01/07/14 0320 01/08/14 0353  NA 136* 138  K 4.4 3.8  CL 96 94*  CO2 19 20  GLUCOSE 82 77  BUN 84* 40*  CREATININE 6.05* 3.97*  CALCIUM 8.5 8.9    COAG Lab Results  Component Value Date   INR 1.30 01/04/2014   INR 1.52* 01/03/2014   INR 1.55* 01/02/2014   No results found for this basename: PTT

## 2014-01-08 NOTE — Consult Note (Signed)
Called by Hospitalist service to evaluate pt's left hand for ischemia.  Pt is confused and not a very good historian.  She has a left arm av fistula which has been in place several months.  She had complaints of left hand numbness 5 days ago and some workup with brain MRI and duplex of AV fistula performed.  Several notes documenting possible steal from fistula but no actual documented physical exam for several days.  Apparently this is the first time the patient was noted to have discoloration of the 5th digit.  She does not really complain of numbness or pain on questioning but at other times she does.  PE:  Filed Vitals:   01/08/14 1930 01/08/14 1935 01/08/14 1945 01/08/14 2141  BP: 181/88 174/92 159/90 158/69  Pulse: 88 88 90 92  Temp:  97.8 F (36.6 C)  98.8 F (37.1 C)  TempSrc:  Oral  Oral  Resp: 12 16 12 14   Height:      Weight:  153 lb 10.6 oz (69.7 kg)    SpO2:  97%  95%    Cardiac: occasional premature beat slightly irregular Left upper extremity: palpable pulse/thrill left upper arm fistula, biphasic ulnar and radial doppler left and right hand.  Slight augmentation of left wrist doppler with compression of fistula.  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  This does not appear to be acute large vessel embolic phenonmenon.  Will discuss with family consent for procedure.  Fabienne Bruns, MD [facility] [place] Office: [phone] Pager: [phone]

## 2014-01-08 NOTE — Progress Notes (Signed)
Zosyn per Pharmacy:  4 YOF with multiple, complex medical problems including CKD stage V from uncontrolled diabetes and HTN , PVD, on Zosyn D#8 for R foot cellulitis, plan for amputation on 4/1. She is Afebrile, WBC elevated at 15.9, no cultures, just started HD, tolerated 4 hr session at BFR of 300.   3/21 zosyn>>  Plan: 1. Continue zosyn 2.25 g IV Q8H - planning to cont at least through surgery 2. F/u [facility] plan after procedure 3. F/u restart home meds  Bayard Hugger, PharmD, BCPS  Clinical Pharmacist  Pager: [phone]

## 2014-01-08 NOTE — Progress Notes (Signed)
Patient ID: [name], female   DOB: 02/17/1941, 73 y.o.   MRN: [id number]  [facility] PROGRESS NOTE  Rhonda EvesMary P Diviney FAO:[id number] DOB: 01/09/1941 DOA: 12/31/2013 PCP: Michiel SitesKOHUT,WALTER DENNIS, MD  Brief narrative:  Pt is 73 yo AAF with multiple, complex medical problems including CKD stage V from uncontrolled diabetes and HTN , PVD, [facility], with main concern of progressively worsening discoloration of the right foot hallux associated with throbbing and constant pain, 10/10 in severity, worse with touching, cold to touch, no specific alleviating factors. No reported fevers on admission, no chest pain or shortness of breath. Main concern raised is management of the right ischemic hallux as iodinated contrast is required, risk being progression to ESRD and requiring HD. The patient has a left upper arm brachiocephalic fistula placed 09/07/2013.   Atherosclerotic PVD with ulceration - plan for surgery next week.  Cellulitis of the right foot - continue Zosyn day #7, we'll have to continue until surgery Left arm numbness - MRI negative for acute events but chronic ischemic changes noted  ?Confusion and transient blindness - neurology consulted, appreciate input. Patient's exam inconsistent per neurology notes.  Chronic systolic congestive heart failure - per 2 D ECHO 3/22 --> EF 30 - 35 %  - undergoing HD again today per renal.  CKD (chronic kidney disease), stage IV - on HD now. Hyperkalemia- HD COPD (chronic obstructive pulmonary disease) - clinically compensated, maintaining oxygen saturations at target range  Essential hypertension - continue Imdur, Coreg, Amlodipine  Anemia of chronic disease  - no signs of active bleeding but drop in hg since admission  - aranesp started per nephrology team  DM type 2, uncontrolled, with renal complications  - reasonable inpatient control - A1C (3/21) 8.1  - continue SSI while inpatient  Leukocytosis - secondary to right hallux  ischemia and cellulitis  HLD - continue statin as per home medical regimen  Moderate malnutrition - albumin ~2, secondary to progressive nature of the complex medical conditions outlined above  - Patient encouraged to eat again today  Procedures/Studies:  CXR 01/01/2014 Congestive heart failure pattern which is mildly worsened from prior.  01/03/2014 US-guided access to left common femoral artery, CO2 aortogram, selective catheterization of the right external iliac artery with right lower extremity runoff using CO2  Antibiotics:  Zosyn 3/21 -->   Code Status: Full  Family Communication: Pt and daughter at bedside  Disposition Plan: Home when medically stable, daughter wants to take her home, no SNF; plan for surgery per vascular early next week.   HPI/Subjective: - no pain today.  Objective: Filed Vitals:   01/07/14 2200 01/07/14 2215 01/07/14 2242 01/08/14 0449  BP: 174/85 129/93 172/62 166/76  Pulse: 91 103 92 94  Temp: 98.5 F (36.9 C)  98.9 F (37.2 C) 98.8 F (37.1 C)  TempSrc: Oral  Oral Oral  Resp: 17 16 16 16   Height:      Weight: 73.1 kg (161 lb 2.5 oz)     SpO2: 96%  96% 94%    Intake/Output Summary (Last 24 hours) at 01/08/14 1000 Last data filed at 01/07/14 2200  Gross per 24 hour  Intake    0.5 ml  Output   1739 ml  Net -1738.5 ml   Exam:  General:  Pt is alert, follows commands appropriately, not in acute distress  Cardiovascular: Regular rate and rhythm, S1/S2, no murmurs, no rubs, no gallops  Respiratory: Clear to auscultation bilaterally, no wheezing, no crackles,  no rhonchi  Abdomen: Soft, non tender, non distended, bowel sounds present, no guarding  Extremities: Right foot cold and ischemia, edema and tender to palpation   Data Reviewed: Basic Metabolic Panel:  Recent Labs Lab 01/04/14 0550 01/05/14 0307 01/06/14 0421 01/07/14 0320 01/08/14 0353  NA 133* 132* 130* 136* 138  K 5.5* 5.3 4.3 4.4 3.8  CL 95* 93* 91* 96 94*  CO2 15* 16*  18* 19 20  GLUCOSE 161* 94 102* 82 77  BUN 104* 105* 82* 84* 40*  CREATININE 6.03* 6.65* 5.67* 6.05* 3.97*  CALCIUM 9.1 8.5 8.2* 8.5 8.9  PHOS 7.8* 7.1* 5.1* 5.8* 3.9   Liver Function Tests:  Recent Labs Lab 01/01/14 1522  01/04/14 0550 01/05/14 0307 01/06/14 0421 01/07/14 0320 01/08/14 0353  AST 15  --   --   --   --   --   --   ALT 11  --   --   --   --   --   --   ALKPHOS 84  --   --   --   --   --   --   BILITOT 0.3  --   --   --   --   --   --   PROT 6.6  --   --   --   --   --   --   ALBUMIN 2.2*  < > 2.0* 2.0* 2.0* 1.9* 2.0*  < > = values in this interval not displayed. CBC:  Recent Labs Lab 01/01/14 1522 01/03/14 0548 01/03/14 1329 01/04/14 0550 01/08/14 0353  WBC 11.9* 13.4* 15.0* 14.1* 15.9*  HGB 9.7* 9.4* 10.0* 9.7* 10.5*  HCT 29.1* 29.0* 30.9* 29.6* 32.4*  MCV 81.1 82.6 82.2 81.3 82.4  PLT 204 210 240 246 244   CBG:  Recent Labs Lab 01/07/14 0625 01/07/14 1117 01/07/14 1611 01/07/14 2249 01/08/14 0637  GLUCAP 90 93 103* 91 82   Scheduled Meds: . amLODipine  10 mg Oral Daily  . [START ON 01/12/2014] darbepoetin (ARANESP) injection - DIALYSIS  60 mcg Intravenous Q Wed-HD  . doxercalciferol  2 mcg Intravenous Q M,W,F-HD  . enoxaparin (LOVENOX) injection  30 mg Subcutaneous Q24H  . insulin aspart  0-15 Units Subcutaneous TID WC  . lanthanum  500 mg Oral TID WC  . piperacillin-tazobactam (ZOSYN)  IV  2.25 g Intravenous 3 times per day  . polyethylene glycol  17 g Oral Daily  . sodium chloride  3 mL Intravenous Q12H   Continuous Infusions:   Pamella Pert, MD  Pager [phone] Time spent: 25 minutes.  If 7PM-7AM, please contact night-coverage [email or web address] Password [email or web address] 01/08/2014, 10:00 AM  LOS: 8 days

## 2014-01-08 NOTE — Progress Notes (Signed)
Pt son arrived to unit and stated that the pt does not have a POA, and that no such document exists citing a POA.  Pt's daughter still has not brought in the POA form that she states she has. Pt's son states he is the eldest child and he typically makes the decisions on his mother's behalf.  He says that his mother definitely did not want CPR or to be hooked up to any machines to be kept alive. Son's number is [phone], or [phone].  Will notify MD.

## 2014-01-08 NOTE — Progress Notes (Signed)
[facility] hospital[facility] progress note. Chief complaint. Discolored left hand fifth finger. History of present illness. 73 year old female with end-stage renal disease on chronic hemodialysis is in hospital with right hallux ischemia. Patient had dialysis today and upon return was noted by the nursing staff to have a discolored left hand fifth finger. I came to see the patient at bedside and found her alert and in no distress. The left hand fourth and fifth fingers demonstrate signs of ischemia. Both fingers appear mottled and the tip of the fifth finger dark purplish in color. There is pain with soft touch. Radial pulse left hand is dopplerable and strong. Patient does describe pain to the site particularly when touched. Vital signs. Temperature 98.8, pulse 92, respiration 14, blood pressure 158/69. O2 sats 95%. General appearance. Frail elderly female who is alert and in no distress. Cardiac. Rate and rhythm regular. Lungs. Breath sounds clear and equal. Abdomen. Soft with positive bowel sounds. No pain. Extremities. The left biceps area dialysis graft site has a palpable thrill. I cannot palpate radial pulse but the radial pulse is clear and strong per Doppler. Impression/plan. Problem #1. Ischemic left hand fourth and fifth fingers. I have contacted Doctor Fields of vascular surgery and he is calm he consented to come tonight and see the patient. I did update the patient's daughter by phone about this development. We'll follow for any further recommendations per vascular surgery.

## 2014-01-08 NOTE — Progress Notes (Signed)
After PA and I spoke with son regarding consent for pt, son had left immediately after we had left the room.  So, he was unavailable to sign a consent, but told other staff he will be back tomorrow to sign it.

## 2014-01-09 ENCOUNTER — Inpatient Hospital Stay (HOSPITAL_COMMUNITY): Payer: Medicare Other

## 2014-01-09 ENCOUNTER — Encounter (HOSPITAL_COMMUNITY): Payer: Medicare Other | Admitting: Anesthesiology

## 2014-01-09 ENCOUNTER — Inpatient Hospital Stay (HOSPITAL_COMMUNITY): Payer: Medicare Other | Admitting: Anesthesiology

## 2014-01-09 ENCOUNTER — Encounter (HOSPITAL_COMMUNITY)
Admission: EM | Disposition: A | Discharge status: Skilled Nursing Facility | Payer: Self-pay | Source: Home / Self Care | Attending: Internal Medicine

## 2014-01-09 HISTORY — PX: LIGATION ARTERIOVENOUS GORTEX GRAFT: SHX5947

## 2014-01-09 HISTORY — PX: INSERTION OF DIALYSIS CATHETER: SHX1324

## 2014-01-09 LAB — SURGICAL PCR SCREEN
MRSA, PCR: NEGATIVE
Staphylococcus aureus: NEGATIVE

## 2014-01-09 LAB — GLUCOSE, CAPILLARY
GLUCOSE-CAPILLARY: 87 mg/dL (ref 70–99)
Glucose-Capillary: 98 mg/dL (ref 70–99)
Glucose-Capillary: 113 mg/dL — ABNORMAL HIGH (ref 70–99)
Glucose-Capillary: 114 mg/dL — ABNORMAL HIGH (ref 70–99)

## 2014-01-09 LAB — CBC
HCT: 33.4 % — ABNORMAL LOW (ref 36.0–46.0)
Hemoglobin: 10.8 g/dL — ABNORMAL LOW (ref 12.0–15.0)
MCH: 27.1 pg (ref 26.0–34.0)
MCHC: 32.3 g/dL (ref 30.0–36.0)
MCV: 83.9 fL (ref 78.0–100.0)
Platelets: 267 10*3/uL (ref 150–400)
RBC: 3.98 MIL/uL (ref 3.87–5.11)
RDW: 17.5 % — ABNORMAL HIGH (ref 11.5–15.5)
WBC: 18.4 10*3/uL — ABNORMAL HIGH (ref 4.0–10.5)

## 2014-01-09 LAB — RENAL FUNCTION PANEL
Albumin: 2.1 g/dL — ABNORMAL LOW (ref 3.5–5.2)
BUN: 20 mg/dL (ref 6–23)
CO2: 22 meq/L (ref 19–32)
Calcium: 9.2 mg/dL (ref 8.4–10.5)
Chloride: 96 meq/L (ref 96–112)
Creatinine, Ser: 2.8 mg/dL — ABNORMAL HIGH (ref 0.50–1.10)
GFR calc Af Amer: 18 mL/min — ABNORMAL LOW
GFR calc non Af Amer: 16 mL/min — ABNORMAL LOW
Glucose, Bld: 92 mg/dL (ref 70–99)
Phosphorus: 4 mg/dL (ref 2.3–4.6)
Potassium: 4.2 meq/L (ref 3.7–5.3)
Sodium: 141 meq/L (ref 137–147)

## 2014-01-09 SURGERY — LIGATION ARTERIOVENOUS GORTEX GRAFT
Anesthesia: Monitor Anesthesia Care | Site: Chest | Laterality: Right

## 2014-01-09 MED ORDER — FENTANYL CITRATE 0.05 MG/ML IJ SOLN
INTRAMUSCULAR | Status: DC | PRN
  Administered 2014-01-09 (×2): 25 ug via INTRAVENOUS
  Administered 2014-01-09: 50 ug via INTRAVENOUS

## 2014-01-09 MED ORDER — LIDOCAINE HCL (PF) 1 % IJ SOLN
INTRAMUSCULAR | Status: AC
  Filled 2014-01-09: qty 30

## 2014-01-09 MED ORDER — CEFAZOLIN SODIUM 1-5 GM-% IV SOLN
INTRAVENOUS | Status: DC | PRN
  Administered 2014-01-09: 1 g via INTRAVENOUS

## 2014-01-09 MED ORDER — PROPOFOL 10 MG/ML IV BOLUS
INTRAVENOUS | Status: AC
  Filled 2014-01-09: qty 20

## 2014-01-09 MED ORDER — FENTANYL CITRATE 0.05 MG/ML IJ SOLN
INTRAMUSCULAR | Status: AC
  Filled 2014-01-09: qty 5

## 2014-01-09 MED ORDER — DEXTROSE 5 % IV SOLN
INTRAVENOUS | Status: DC | PRN
  Administered 2014-01-09: 01:00:00 via INTRAVENOUS

## 2014-01-09 MED ORDER — SODIUM CHLORIDE 0.9 % IR SOLN
Status: DC | PRN
  Administered 2014-01-09 (×2)

## 2014-01-09 MED ORDER — MIDAZOLAM HCL 5 MG/5ML IJ SOLN
INTRAMUSCULAR | Status: DC | PRN
  Administered 2014-01-09: 1 mg via INTRAVENOUS
  Administered 2014-01-09 (×2): 0.5 mg via INTRAVENOUS

## 2014-01-09 MED ORDER — LIDOCAINE HCL (PF) 1 % IJ SOLN
INTRAMUSCULAR | Status: DC | PRN
  Administered 2014-01-09: 18 mL

## 2014-01-09 MED ORDER — HEPARIN SODIUM (PORCINE) 1000 UNIT/ML IJ SOLN
INTRAMUSCULAR | Status: DC | PRN
  Administered 2014-01-09: 4.2 mL via INTRAVENOUS

## 2014-01-09 MED ORDER — CEFAZOLIN SODIUM 1-5 GM-% IV SOLN
INTRAVENOUS | Status: AC
  Filled 2014-01-09: qty 50

## 2014-01-09 MED ORDER — HEPARIN SODIUM (PORCINE) 1000 UNIT/ML IJ SOLN
INTRAMUSCULAR | Status: AC
  Filled 2014-01-09: qty 1

## 2014-01-09 MED ORDER — 0.9 % SODIUM CHLORIDE (POUR BTL) OPTIME
TOPICAL | Status: DC | PRN
  Administered 2014-01-09: 300 mL

## 2014-01-09 MED ORDER — MORPHINE SULFATE 2 MG/ML IJ SOLN: 2.0000 mg | INTRAMUSCULAR | Status: DC | PRN

## 2014-01-09 MED ORDER — MIDAZOLAM HCL 2 MG/2ML IJ SOLN
INTRAMUSCULAR | Status: AC
  Filled 2014-01-09: qty 2

## 2014-01-09 MED ORDER — SODIUM CHLORIDE 0.9 % IV SOLN
INTRAVENOUS | Status: DC | PRN
  Administered 2014-01-09: via INTRAVENOUS

## 2014-01-09 SURGICAL SUPPLY — 67 items
ADH SKN CLS APL DERMABOND .7 (GAUZE/BANDAGES/DRESSINGS) ×2
ADH SKN CLS LQ APL DERMABOND (GAUZE/BANDAGES/DRESSINGS) ×2
BAG DECANTER FOR FLEXI CONT (MISCELLANEOUS) ×3 IMPLANT
CANISTER SUCTION 2500CC (MISCELLANEOUS) ×3 IMPLANT
CATH CANNON HEMO 15F 50CM (CATHETERS) IMPLANT
CATH CANNON HEMO 15FR 19 (HEMODIALYSIS SUPPLIES) IMPLANT
CATH CANNON HEMO 15FR 23CM (HEMODIALYSIS SUPPLIES) IMPLANT
CATH CANNON HEMO 15FR 31CM (HEMODIALYSIS SUPPLIES) ×1 IMPLANT
CATH CANNON HEMO 15FR 32 (HEMODIALYSIS SUPPLIES) IMPLANT
CATH CANNON HEMO 15FR 32CM (HEMODIALYSIS SUPPLIES) IMPLANT
CATH STRAIGHT 5FR 65CM (CATHETERS) IMPLANT
CHLORAPREP W/TINT 26ML (MISCELLANEOUS) ×3 IMPLANT
COVER PROBE W GEL 5X96 (DRAPES) IMPLANT
COVER SURGICAL LIGHT HANDLE (MISCELLANEOUS) ×3 IMPLANT
DECANTER SPIKE VIAL GLASS SM (MISCELLANEOUS) ×3 IMPLANT
DERMABOND ADHESIVE PROPEN (GAUZE/BANDAGES/DRESSINGS) ×1
DERMABOND ADVANCED (GAUZE/BANDAGES/DRESSINGS) ×1
DERMABOND ADVANCED .7 DNX12 (GAUZE/BANDAGES/DRESSINGS) ×2 IMPLANT
DERMABOND ADVANCED .7 DNX6 (GAUZE/BANDAGES/DRESSINGS) IMPLANT
DRAPE C-ARM 42X72 X-RAY (DRAPES) ×3 IMPLANT
DRAPE CHEST BREAST 15X10 FENES (DRAPES) ×3 IMPLANT
ELECT REM PT RETURN 9FT ADLT (ELECTROSURGICAL) ×3
ELECTRODE REM PT RTRN 9FT ADLT (ELECTROSURGICAL) ×2 IMPLANT
GAUZE SPONGE 2X2 8PLY STRL LF (GAUZE/BANDAGES/DRESSINGS) ×2 IMPLANT
GAUZE SPONGE 4X4 16PLY XRAY LF (GAUZE/BANDAGES/DRESSINGS) ×3 IMPLANT
GEL ULTRASOUND 20GR AQUASONIC (MISCELLANEOUS) IMPLANT
GLOVE BIO SURGEON STRL SZ7.5 (GLOVE) ×5 IMPLANT
GLOVE BIOGEL PI IND STRL 6.5 (GLOVE) IMPLANT
GLOVE BIOGEL PI IND STRL 8 (GLOVE) IMPLANT
GLOVE BIOGEL PI IND STRL 8.5 (GLOVE) IMPLANT
GLOVE BIOGEL PI INDICATOR 6.5 (GLOVE) ×1
GLOVE BIOGEL PI INDICATOR 8 (GLOVE) ×1
GLOVE BIOGEL PI INDICATOR 8.5 (GLOVE) ×1
GLOVE ECLIPSE 7.5 STRL STRAW (GLOVE) ×1 IMPLANT
GOWN STRL REUS W/ TWL LRG LVL3 (GOWN DISPOSABLE) ×6 IMPLANT
GOWN STRL REUS W/TWL LRG LVL3 (GOWN DISPOSABLE) ×9
KIT BASIN OR (CUSTOM PROCEDURE TRAY) ×3 IMPLANT
KIT ROOM TURNOVER OR (KITS) ×3 IMPLANT
LOOP VESSEL MINI RED (MISCELLANEOUS) IMPLANT
NDL 18GX1X1/2 (RX/OR ONLY) (NEEDLE) ×2 IMPLANT
NDL HYPO 25GX1X1/2 BEV (NEEDLE) ×2 IMPLANT
NEEDLE 18GX1X1/2 (RX/OR ONLY) (NEEDLE) ×3 IMPLANT
NEEDLE 22X1 1/2 (OR ONLY) (NEEDLE) ×3 IMPLANT
NEEDLE HYPO 25GX1X1/2 BEV (NEEDLE) ×3 IMPLANT
NS IRRIG 1000ML POUR BTL (IV SOLUTION) ×3 IMPLANT
PACK CV ACCESS (CUSTOM PROCEDURE TRAY) ×3 IMPLANT
PACK SURGICAL SETUP 50X90 (CUSTOM PROCEDURE TRAY) ×3 IMPLANT
PAD ARMBOARD 7.5X6 YLW CONV (MISCELLANEOUS) ×6 IMPLANT
SET MICROPUNCTURE 5F STIFF (MISCELLANEOUS) IMPLANT
SPONGE GAUZE 2X2 STER 10/PKG (GAUZE/BANDAGES/DRESSINGS) ×1
SPONGE SURGIFOAM ABS GEL 100 (HEMOSTASIS) IMPLANT
SUT ETHILON 3 0 PS 1 (SUTURE) ×3 IMPLANT
SUT PROLENE 6 0 CC (SUTURE) IMPLANT
SUT SILK 0 (SUTURE) IMPLANT
SUT VIC AB 3-0 SH 27 (SUTURE) ×3
SUT VIC AB 3-0 SH 27X BRD (SUTURE) ×2 IMPLANT
SUT VICRYL 4-0 PS2 18IN ABS (SUTURE) ×3 IMPLANT
SYR 20CC LL (SYRINGE) ×6 IMPLANT
SYR 30ML LL (SYRINGE) IMPLANT
SYR 5ML LL (SYRINGE) ×6 IMPLANT
SYR CONTROL 10ML LL (SYRINGE) ×3 IMPLANT
SYRINGE 10CC LL (SYRINGE) ×3 IMPLANT
TOWEL OR 17X24 6PK STRL BLUE (TOWEL DISPOSABLE) ×3 IMPLANT
TOWEL OR 17X26 10 PK STRL BLUE (TOWEL DISPOSABLE) ×3 IMPLANT
UNDERPAD 30X30 INCONTINENT (UNDERPADS AND DIAPERS) ×3 IMPLANT
WATER STERILE IRR 1000ML POUR (IV SOLUTION) ×3 IMPLANT
WIRE AMPLATZ SS-J .035X180CM (WIRE) IMPLANT

## 2014-01-09 NOTE — Transfer of Care (Signed)
Immediate Anesthesia Transfer of Care Note  Patient: [name]  Procedure(s) Performed: Procedure(s) with comments: LIGATION ARTERIOVENOUS GORTEX GRAFT (Left) INSERTION OF DIALYSIS CATHETER (Right) - Insertion of diatek to right internal Jugular artery.  Patient Location: PACU  Anesthesia Type:MAC  Level of Consciousness: oriented, sedated, patient cooperative and responds to stimulation  Airway & Oxygen Therapy: Patient Spontanous Breathing and Patient connected to face mask oxygen  Post-op Assessment: Report given to PACU RN, Post -op Vital signs reviewed and stable, Patient moving all extremities and Patient moving all extremities X 4  Post vital signs: Reviewed and stable  Complications: No apparent anesthesia complications

## 2014-01-09 NOTE — Progress Notes (Signed)
Patient back from OR.
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
## 2014-01-09 NOTE — Op Note (Signed)
Procedure: Ultrasound-guided insertion of Diatek catheter, ligation of left brachiocephalic AV fistula  Preoperative diagnosis: Ischemic steal left hand, End-stage renal disease  Postoperative diagnosis: Same  Anesthesia: Local with IV sedation  Operative findings: 29 cm Diatek catheter right internal jugular vein, heavily calcified left brachial artery  Operative details: After obtaining informed consent, the patient was taken to the operating room. The patient was placed in supine position on the operating room table. Next the patient's entire left upper extremity was prepped and draped in usual sterile fashion. Anesthesia was infiltrated and a pre-existing scar in the left antecubital crease. A transverse incision was made in this location carried to the subcutaneous tissues down to level of the fistula. There was some hematoma in the proximal fistula vein wall from a recent cannulation. There was a palpable pulse within the brachial artery. The fistula was dissected free circumferentially just above the anastomosis. The vein was doubly ligated with a #1 silk tie. After ligation of the fistula there was good biphasic radial and ulnar Doppler artery flow. The hand was pink in color. However, there were still minimal Doppler signal in the palmar arch and digital vessels. The subcutaneous tissues were reapproximated using a running 3-0 Vicryl suture. The skin was closed with 4 0 Vicryl subcuticular stitch. Dermabond was applied to the skin.   Next, the patient's entire neck and chest were prepped and draped in usual sterile fashion. The patient was placed in Trendelenburg position. Ultrasound was used to identify the patient's right internal jugular vein. This had normal compressibility and respiratory variation. Local anesthesia was infiltrated over the right jugular vein.  Using ultrasound guidance, the right internal jugular vein was successfully cannulated using a micropuncture needle. A  micropuncture was threaded into the superior vena cava right atrium and inferior vena cava under fluoroscopic guidance. The micropuncture sheath was placed over the micropuncture wire. The wire was exchanged for an 035 J-tipped guidewire..  A 0.035 J-tipped guidewire was threaded into the right internal jugular vein and into the superior vena cava followed by the inferior vena cava under fluoroscopic guidance.   Next sequential 12 and 14 dilators were placed over the guidewire into the right atrium.  A 16 French dilator with a peel-away sheath was then placed over the guidewire into the right atrium.   The guidewire and dilator were removed. A 19 cm Diatek catheter was then placed through the peel away sheath into the right atrium.  The catheter was then tunneled subcutaneously, cut to length, and the hub attached. The catheter was noted to flush and draw easily. The catheter was inspected under fluoroscopy and found with its tip to be in the right atrium without any kinks throughout its course. The catheter was sutured to the skin with nylon sutures. The neck insertion site was closed with Vicryl stitch. The catheter was then loaded with concentrated Heparin solution. A dry sterile dressing was applied.  The patient tolerated procedure well and there were no complications. Instrument sponge and needle counts correct in the case. The patient was taken to the recovery room in stable condition. Chest x-ray will be obtained in the recovery room.  Fabienne Bruns, MD [facility] Office: [phone] Pager: [phone]

## 2014-01-09 NOTE — Progress Notes (Signed)
NOTE:  Noted several smudges of dried blood noted to right side of head on pillowcase, no noted areas where it might have come from, will continue to follow and monitor, Berle Mull RN

## 2014-01-09 NOTE — Progress Notes (Addendum)
Patient daughter ask me to call doctor and let him know about patient finger. Call Dr. Darrick Penna who was on call at 21:06 and spoke with him. Dr. Darrick Penna ask me to call  Internal [facility]. And have them come look at it. And have them call him back if feels need to come in to see patient. Tom.Claiborne Billings N.P. Call at 21:16 and made aware of tonight's events and will come see patient and assess patient. Benedetto Coons arrived 21:30 Ring finger on left hand now showing some bruising. He call Nephrologist on call and inform them and also called DR. Fields.  Dr. Darrick Penna here and into see patient at 22:25 and Dr Darrick Penna also called daughter [name]. Benedetto Coons N.P. also spoke with daughter earlier. Charge nurse Zack Seal r.n. And Clydie Braun r.n. Aware of the above.

## 2014-01-09 NOTE — Progress Notes (Signed)
Patient ID: [name], female   DOB: [birth year], 73 y.o.   MRN: [id number]   [facility] Progress Note    Assessment/ Plan:   1. Chronic kidney disease stage V with contrast exposure: Now ESRD-  initiated dialysis therapy this admit.  S/p third HD Saturday.  CLIP in process, will likely need placement after hosp.  Had AVF which unfortunately needed to be ligated secondary to steal- now with PC- not sure appropriate to rush into another access procedure right now.  Will plan next HD for Tuesday and keep her on a TTS schedule 2. Right foot ischemia: unfortunately no good options for revasc.  Pain is severe, at times better controlled others.  On broad-spectrum antibiotic therapy with Zosyn for associated cellulitis. Will need amputation , likely AKA , next week 3. Anemia: Hemoglobin stable-she is status post intravenous iron therapy for low Tsat . On aranesp as well 4. Hypertension: on norvasc ,-likely to be aggravated by pain/fluids. - dialysis has helped - norvasc has been increased- UF with HD 5. Metabolic bone disease: On  fosrenol-  hectorol with HD and follow PTH 6. Possible steal- intermittent complaints in the past but now has declared- required ligation of AVF and fifth digit still dusky- appreciate VVS prompt service 7. Dispo- I am hoping that after amputation will perk up some as wont need so much pain meds- if this does not happen not sure pt is best candidate for chronic dialysis therapy, will see how it plays out.    Subjective:   Events noted- patient found to have ischemic fifth digit more pronounced after her dialysis treatment yesterday, need to go to the OR for ligation of AVF and PC- resting comfortably today   Objective:   BP 173/83  Pulse 88  Temp(Src) 97.2 F (36.2 C) (Axillary)  Resp 16  Ht 5\' 3"  (1.6 m)  Wt 73.1 kg (161 lb 2.5 oz)  BMI 28.55 kg/m2  SpO2 98%  Intake/Output Summary (Last 24 hours) at 01/09/14 0839 Last data filed at 01/09/14 0300  Gross per 24 hour  Intake    350 ml  Output   2006 ml  Net  -1656 ml   Weight change: -3 kg (-6 lb 9.8 oz)  Physical Exam: Gen: Comfortably resting in bed CVS: Pulse regular in rate and rhythm, S1 and S2 with an ejection systolic murmur Resp: Clear to auscultation bilaterally, no rales/rhonchi Abd: Soft, flat, nontender Ext:  ischemic changes- really throughout foot. Positive edema.  Left upper arm AVF ligated- fifth digit dusky- new right sided PC  Imaging: Dg Chest Port 1 View  01/09/2014   CLINICAL DATA:  Central line placement  EXAM: PORTABLE CHEST - 1 VIEW  COMPARISON:  01/01/2014  FINDINGS: Dual-lumen central line, tip at the level of the upper right atrium. No evidence of pneumothorax.  Unchanged cardiopericardial enlargement. Status post CABG. Worsening diffuse interstitial opacity. There are small layering pleural effusions, right more than left.  IMPRESSION: 1. Right IJ central line with the lowest catheter tip in the upper right atrium. No pneumothorax. 2. Worsening CHF.   Electronically Signed   By: Tiburcio Pea M.D.   On: 01/09/2014 02:43   Dg Fluoro Guide Cv Line-no Report  01/09/2014   CLINICAL DATA: check for diatek placement   FLOURO GUIDE CV LINE  Fluoroscopy was utilized by the requesting physician.  No radiographic  interpretation.     Labs: BMET  Recent Labs Lab 01/03/14 [phone] 01/03/14 1329 01/04/14 0550 01/05/14  [id number] 01/06/14 0421 01/07/14 0320 01/08/14 0353 01/09/14 0320  NA 136*  --  133* 132* 130* 136* 138 141  K 5.0  --  5.5* 5.3 4.3 4.4 3.8 4.2  CL 98  --  95* 93* 91* 96 94* 96  CO2 18*  --  15* 16* 18* 19 20 22   GLUCOSE 109*  --  161* 94 102* 82 77 92  BUN 98*  --  104* 105* 82* 84* 40* 20  CREATININE 5.94* 5.90* 6.03* 6.65* 5.67* 6.05* 3.97* 2.80*  CALCIUM 8.9  --  9.1 8.5 8.2* 8.5 8.9 9.2  PHOS 6.9*  --  7.8* 7.1* 5.1* 5.8* 3.9 4.0   CBC  Recent Labs Lab 01/03/14 1329 01/04/14 0550 01/08/14 0353 01/09/14 0320  WBC 15.0* 14.1* 15.9*  18.4*  HGB 10.0* 9.7* 10.5* 10.8*  HCT 30.9* 29.6* 32.4* 33.4*  MCV 82.2 81.3 82.4 83.9  PLT 240 246 244 267   Medications:    . amLODipine  10 mg Oral Daily  . [START ON 01/12/2014] darbepoetin (ARANESP) injection - DIALYSIS  60 mcg Intravenous Q Wed-HD  . doxercalciferol  2 mcg Intravenous Q M,W,F-HD  . enoxaparin (LOVENOX) injection  30 mg Subcutaneous Q24H  . insulin aspart  0-15 Units Subcutaneous TID WC  . lanthanum  500 mg Oral TID WC  . piperacillin-tazobactam (ZOSYN)  IV  2.25 g Intravenous 3 times per day  . polyethylene glycol  17 g Oral Daily  . sodium chloride  3 mL Intravenous Q12H   Sopheap Boehle A   01/09/2014, 8:39 AM

## 2014-01-09 NOTE — Progress Notes (Addendum)
Patient ID: [name], female   DOB: [birth year], 73 y.o.   MRN: [id number]  [facility] PROGRESS NOTE  TIINA DEBOW TRV:[id number] DOB: [birth year] DOA: 12/31/2013 PCP: Michiel Sites, MD  Brief narrative:  Pt is 73 yo AAF with multiple, complex medical problems including CKD stage V from uncontrolled diabetes and HTN , PVD, presented to [facility] ED March 20th, with main concern of progressively worsening discoloration of the right foot hallux associated with throbbing and constant pain, 10/10 in severity, worse with touching, cold to touch, no specific alleviating factors. No reported fevers on admission, no chest pain or shortness of breath. Main concern raised is management of the right ischemic hallux as iodinated contrast is required, risk being progression to ESRD and requiring HD. The patient has a left upper arm brachiocephalic fistula placed 09/07/2013.    Atherosclerotic PVD with ulceration - plan for surgery next week.  Left 5th digit discoloration - due to ischemic steal, noted after HD, now s/p left brachiocephalic fistula ligation on 3/28 Cellulitis of the right foot - continue Zosyn day #8, we'll have to continue until surgery Left arm numbness - MRI negative for acute events but chronic ischemic changes noted  ?Confusion and transient blindness - neurology consulted, appreciate input. Patient's exam inconsistent per neurology notes.  Chronic systolic congestive heart failure - per 2 D ECHO 3/22 --> EF 30 - 35 %  - undergoing HD again today per renal.  CKD (chronic kidney disease), stage IV - on HD now. Hyperkalemia- HD COPD (chronic obstructive pulmonary disease) - clinically compensated, maintaining oxygen saturations at target range  Essential hypertension - continue Imdur, Coreg, Amlodipine  Anemia of chronic disease  - no signs of active bleeding but drop in hg since admission  - aranesp started per nephrology team  DM type 2, uncontrolled, with renal complications  -  reasonable inpatient control - A1C (3/21) 8.1  - continue SSI while inpatient  Leukocytosis - secondary to right hallux ischemia and cellulitis  HLD - continue statin as per home medical regimen  Moderate malnutrition - albumin ~2, secondary to progressive nature of the complex medical conditions outlined above  - Patient eating very little, states that food gives her generalized pain throughout her entire body.  Goals of care - patient with progressive decline in the past 4-5 months, initially her wishes were DNR/DNI and she favored no  dialysis last November when she was my patient, but wanted to think about it. She is now unable to make decisions as she has intermittent confusion and her daughter is making her medical decisions, and states that her mother wishes dialysis and to be a full code. Initially today she asked about transfer to [facility] to RN however she stated that she does not wish transfer when I was trying to confirm. I have consulted palliative care to help establish goals of care.   Procedures/Studies:  CXR 01/01/2014 Congestive heart failure pattern which is mildly worsened from prior.  01/03/2014 US-guided access to left common femoral artery, CO2 aortogram, selective catheterization of the right external iliac artery with right lower extremity runoff using CO2  Antibiotics:  Zosyn 3/21 -->   Code Status: Full  Family Communication: Pt and daughter at bedside  Disposition Plan: Home when medically stable, daughter wants to take her home, no SNF; plan for surgery per vascular early next week.   HPI/Subjective: - no pain today.  Objective: Filed Vitals:   01/09/14 8616 01/09/14 0502 01/09/14 0503 01/09/14 0624  BP:  161/69 175/79  173/83  Pulse: 90 91  88  Temp: 98.1 F (36.7 C) 97.9 F (36.6 C)  97.2 F (36.2 C)  TempSrc: Oral Oral  Axillary  Resp:  16    Height:      Weight:   73.1 kg (161 lb 2.5 oz)   SpO2: 97% 100%  98%    Intake/Output Summary (Last 24 hours) at  01/09/14 0707 Last data filed at 01/09/14 0300  Gross per 24 hour  Intake    350 ml  Output   2006 ml  Net  -1656 ml   Exam:  General:  Pt is alert, follows commands appropriately, not in acute distress  Cardiovascular: Regular rate and rhythm, S1/S2, no murmurs, no rubs, no gallops  Respiratory: Clear to auscultation bilaterally, no wheezing, no crackles, no rhonchi  Abdomen: Soft, non tender, non distended, bowel sounds present, no guarding  Extremities: Right foot cold and ischemia, edema and tender to palpation   Data Reviewed: Basic Metabolic Panel:  Recent Labs Lab 01/05/14 0307 01/06/14 0421 01/07/14 0320 01/08/14 0353 01/09/14 0320  NA 132* 130* 136* 138 141  K 5.3 4.3 4.4 3.8 4.2  CL 93* 91* 96 94* 96  CO2 16* 18* 19 20 22   GLUCOSE 94 102* 82 77 92  BUN 105* 82* 84* 40* 20  CREATININE 6.65* 5.67* 6.05* 3.97* 2.80*  CALCIUM 8.5 8.2* 8.5 8.9 9.2  PHOS 7.1* 5.1* 5.8* 3.9 4.0   Liver Function Tests:  Recent Labs Lab 01/05/14 0307 01/06/14 0421 01/07/14 0320 01/08/14 0353 01/09/14 0320  ALBUMIN 2.0* 2.0* 1.9* 2.0* 2.1*   CBC:  Recent Labs Lab 01/03/14 0548 01/03/14 1329 01/04/14 0550 01/08/14 0353 01/09/14 0320  WBC 13.4* 15.0* 14.1* 15.9* 18.4*  HGB 9.4* 10.0* 9.7* 10.5* 10.8*  HCT 29.0* 30.9* 29.6* 32.4* 33.4*  MCV 82.6 82.2 81.3 82.4 83.9  PLT 210 240 246 244 267   CBG:  Recent Labs Lab 01/08/14 1118 01/08/14 2139 01/08/14 2350 01/09/14 0217 01/09/14 0643  GLUCAP 84 79 86 87 98   Scheduled Meds: . amLODipine  10 mg Oral Daily  . [START ON 01/12/2014] darbepoetin (ARANESP) injection - DIALYSIS  60 mcg Intravenous Q Wed-HD  . doxercalciferol  2 mcg Intravenous Q M,W,F-HD  . enoxaparin (LOVENOX) injection  30 mg Subcutaneous Q24H  . insulin aspart  0-15 Units Subcutaneous TID WC  . lanthanum  500 mg Oral TID WC  . piperacillin-tazobactam (ZOSYN)  IV  2.25 g Intravenous 3 times per day  . polyethylene glycol  17 g Oral Daily  .  sodium chloride  3 mL Intravenous Q12H   Continuous Infusions:   [name], Nils Thor, MD  Pager [phone] Time spent: 35 minutes.  If 7PM-7AM, please contact night-coverage [email or web address] Password [email or web address] 01/09/2014, 7:07 AM  LOS: 9 days

## 2014-01-09 NOTE — Anesthesia Preprocedure Evaluation (Addendum)
Anesthesia Evaluation  Patient identified by MRN, date of birth, ID band Patient awake    Reviewed: Allergy & Precautions, H&P , NPO status , Patient's Chart, lab work & pertinent test results  Airway Mallampati: I TM Distance: >3 FB Neck ROM: Full    Dental  (+) Edentulous Upper, Edentulous Lower, Dental Advisory Given   Pulmonary sleep apnea and Continuous Positive Airway Pressure Ventilation , former smoker,  breath sounds clear to auscultation        Cardiovascular hypertension, Pt. on medications + CAD Rhythm:Regular Rate:Normal     Neuro/Psych    GI/Hepatic   Endo/Other  diabetes, Well Controlled, Type 2, Insulin Dependent, Oral Hypoglycemic Agents  Renal/GU      Musculoskeletal   Abdominal   Peds  Hematology   Anesthesia Other Findings   Reproductive/Obstetrics                          Anesthesia Physical Anesthesia Plan  ASA: III  Anesthesia Plan: MAC   Post-op Pain Management:    Induction: Intravenous  Airway Management Planned: Simple Face Mask  Additional Equipment:   Intra-op Plan:   Post-operative Plan: Extubation in OR  Informed Consent: I have reviewed the patients History and Physical, chart, labs and discussed the procedure including the risks, benefits and alternatives for the proposed anesthesia with the patient or authorized representative who has indicated his/her understanding and acceptance.   Dental advisory given  Plan Discussed with: CRNA, Anesthesiologist and Surgeon  Anesthesia Plan Comments:       Anesthesia Quick Evaluation

## 2014-01-09 NOTE — Anesthesia Postprocedure Evaluation (Signed)
  Anesthesia Post-op Note  Patient: [name]  Procedure(s) Performed: Procedure(s) with comments: LIGATION ARTERIOVENOUS GORTEX GRAFT (Left) INSERTION OF DIALYSIS CATHETER (Right) - Insertion of diatek to right internal Jugular artery.  Patient Location: PACU  Anesthesia Type:MAC  Level of Consciousness: awake, alert  and oriented  Airway and Oxygen Therapy: Patient Spontanous Breathing  Post-op Pain: none  Post-op Assessment: Post-op Vital signs reviewed  Post-op Vital Signs: Reviewed  Complications: No apparent anesthesia complications

## 2014-01-09 NOTE — Progress Notes (Signed)
Patient taken to O.R. On monitor and with R.n. And daughter present.

## 2014-01-09 NOTE — Preoperative (Signed)
Beta Blockers   Reason not to administer Beta Blockers:Not Applicable 

## 2014-01-09 NOTE — Progress Notes (Addendum)
[facility]  Subjective  - Patient not very alert this am.  Her daughter spoke with me stating she has power of attorney and wants [name] to stop speaking with her other family members especially her older brother.  The brother signed a consent yesterday and seemed very reasonable about her care.  The daughter that is present to day is very angry and seems somewhat unreasonable about her mothers car.  Palliative care is going to be consulted to help establish the patient and her families wishes.   Objective 173/83 88 97.2 F (36.2 C) (Axillary) 16 98%  Intake/Output Summary (Last 24 hours) at 01/09/14 0843 Last data filed at 01/09/14 0300  Gross per 24 hour  Intake    350 ml  Output   2006 ml  Net  -1656 ml    Right great toe cellulitis  Ischemia in the right foot is progressing. Left arm incision C/D/I Hand and fingers warm, except the 5th digit left hand is slightly cooler to touch and dark in discoloration.  Diatek in place  Assessment/Planning: POD #0 diatek catheter and ligation of left brachiocephalic AV fistula  Palliative care help with family wishes for her care.  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 At this point there are also questions concerning if she wants to continue dialysis or not.   Pending out come of POA?   Clinton Gallant [facility] 01/09/2014 8:43 AM -- Pt lethargic this morning Left hand improved perfusion but still with ischemic left 5th digit tip Has ulnar and radial doppler signal and digital artery signals in digits 1-3 Will let primary service coordinate family social issues. Will continue to follow for her vascular problems  Fabienne Bruns, MD [facility] [place] Office: [phone] Pager: [phone]  Laboratory Lab Results:  Recent Labs  01/08/14 0353 01/09/14 0320  WBC 15.9* 18.4*  HGB 10.5* 10.8*  HCT 32.4* 33.4*  PLT 244 267   BMET  Recent Labs   01/08/14 0353 01/09/14 0320  NA 138 141  K 3.8 4.2  CL 94* 96  CO2 20 22  GLUCOSE 77 92  BUN 40* 20  CREATININE 3.97* 2.80*  CALCIUM 8.9 9.2    COAG Lab Results  Component Value Date   INR 1.30 01/04/2014   INR 1.52* 01/03/2014   INR 1.55* 01/02/2014   No results found for this basename: PTT

## 2014-01-09 NOTE — Progress Notes (Signed)
WAS INFORM BY Dr. Darrick Penna patient going to surgery tonight.Dr. Darrick Penna spoke with patient and daughter about surgery and daughter sign permit for surgery and blood permit.witness in front of [facility].

## 2014-01-09 NOTE — Progress Notes (Addendum)
Patient back from dialysis. Assess patient and daughter present and ask me to look at patient left hand. The little finger on left hand, sl. Swollen, black sl. cool and tender to touch. Daughter told me this was new and had not been there. Patient unable to give good inform. The events of her finger being black.. Could feel radial pulse and dopppler radial pulse.  Charge r.n. Aware if the above

## 2014-01-09 NOTE — Anesthesia Procedure Notes (Signed)
Procedure Name: MAC Date/Time: 01/09/2014 12:50 AM Performed by: Wray Kearns A Pre-anesthesia Checklist: Patient identified, Timeout performed, Emergency Drugs available, Suction available and Patient being monitored Patient Re-evaluated:Patient Re-evaluated prior to inductionOxygen Delivery Method: Simple face mask Intubation Type: IV induction

## 2014-01-09 NOTE — Progress Notes (Signed)
According to daughter and previous nurse. Patient has not been eating or drinking. May need diet consult ?

## 2014-01-10 DIAGNOSIS — Z515 Encounter for palliative care: Secondary | ICD-10-CM

## 2014-01-10 LAB — RENAL FUNCTION PANEL
Albumin: 1.8 g/dL — ABNORMAL LOW (ref 3.5–5.2)
BUN: 30 mg/dL — ABNORMAL HIGH (ref 6–23)
CO2: 25 meq/L (ref 19–32)
Calcium: 9 mg/dL (ref 8.4–10.5)
Chloride: 98 mEq/L (ref 96–112)
Creatinine, Ser: 3.78 mg/dL — ABNORMAL HIGH (ref 0.50–1.10)
GFR calc non Af Amer: 11 mL/min — ABNORMAL LOW (ref >90)
GFR, EST AFRICAN AMERICAN: 13 mL/min — AB (ref >90)
Glucose, Bld: 112 mg/dL — ABNORMAL HIGH (ref 70–99)
Phosphorus: 4.7 mg/dL — ABNORMAL HIGH (ref 2.3–4.6)
Potassium: 3.7 mEq/L (ref 3.7–5.3)
SODIUM: 143 meq/L (ref 137–147)

## 2014-01-10 LAB — GLUCOSE, CAPILLARY
GLUCOSE-CAPILLARY: 113 mg/dL — AB (ref 70–99)
GLUCOSE-CAPILLARY: 120 mg/dL — AB (ref 70–99)
GLUCOSE-CAPILLARY: 170 mg/dL — AB (ref 70–99)
Glucose-Capillary: 110 mg/dL — ABNORMAL HIGH (ref 70–99)
Glucose-Capillary: 125 mg/dL — ABNORMAL HIGH (ref 70–99)

## 2014-01-10 LAB — CBC
HCT: 30.4 % — ABNORMAL LOW (ref 36.0–46.0)
Hemoglobin: 9.8 g/dL — ABNORMAL LOW (ref 12.0–15.0)
MCH: 27 pg (ref 26.0–34.0)
MCHC: 32.2 g/dL (ref 30.0–36.0)
MCV: 83.7 fL (ref 78.0–100.0)
Platelets: 231 10*3/uL (ref 150–400)
RBC: 3.63 MIL/uL — ABNORMAL LOW (ref 3.87–5.11)
RDW: 17.8 % — ABNORMAL HIGH (ref 11.5–15.5)
WBC: 17.2 10*3/uL — ABNORMAL HIGH (ref 4.0–10.5)

## 2014-01-10 MED ORDER — NEPRO/CARBSTEADY PO LIQD
237.0000 mL | Freq: Three times a day (TID) | ORAL | Status: DC
  Administered 2014-01-10 – 2014-01-15 (×12): 237 mL via ORAL
  Filled 2014-01-10 (×19): qty 237

## 2014-01-10 MED ORDER — LABETALOL HCL 100 MG PO TABS
100.0000 mg | ORAL_TABLET | Freq: Two times a day (BID) | ORAL | Status: DC
  Administered 2014-01-10 – 2014-01-13 (×6): 100 mg via ORAL
  Filled 2014-01-10 (×8): qty 1

## 2014-01-10 NOTE — Consult Note (Signed)
Patient [name]      DOB: Mar 01, 1941      LEX:[id number]     Consult Note from the Palliative Medicine Team at [place] Requested by: Dr. Cruzita Lederer   PCP: Dwan Bolt, MD Reason for Consultation: [place] and options.  Phone [phone]  Assessment of patients Current state: [name] is a 73 yo female with CKD stage V requiring hemodialysis with uncontrolled diabetes and HTN (LUA brachiocephalic fistula placed 44/96/75) and PVD. Admitted through ED with progressively worsening discoloration of the right hallux with throbbing pain and cold to touch. Pain is described as constant, 10/10, with no relieving factors. [name] is now facing right AKA (planned for Wednesday). She also has left 5th digit discoloration with left brachiocephalic fistula ligation 3/28, cellulitis of right foot, systolic CHF, COPD, and malnutrition.   I met today with [name] and her daughter [name]. [name] was getting [name] (Ms. Ksiazek's husband) on the phone to join [name]. We were just beginning our conversation when [name]'s sons came into the room [name] stormed out and [name] asked to visit with her family). I came back to speak with [name] alone. I spoke with [name] who tells me that they are planning on "taking my leg off Wednesday." I asked her how she felt about everything that has been happening to her and she tells me that she is fine. She says that she accepts however things go for her. I asked her about code status and she says that she does not want CPR, shock, or intubation and that she accepts if it is her time to die. I also asked her about a feeding tube and she says she will never want a feeding tube but to let her eat and drink as she can and what she wants. She also confirms (numerous times) that she wishes for her husband, [name] ,and daughter, [name], to make decisions for her if she were unable to herself. I spoke at length with [name] via the telephone and relayed  what [name] told me, but [name] says that her mother has said that before but then changed her mind saying she only said that so she wouldn't be a burden on [name] and her family. [name] had much to say in regards to disagreement/arguments amongst her brothers and concerns over her mother's care. After I spent much time listening to [name] we decided to re-attempt our meeting tomorrow at 3 pm. I will continue to follow and support [name] and her family.    Goals of Care: 1.  Code Status: FULL   2. Scope of Treatment: Continue all available medical treatment.    4. Disposition: To be determined on outcomes.    3. Symptom Management:   1. Pain: Percocet 5/325 mg + oxycodone 5 mg every 6 hours. Morphine prn. Surgery planned for Wednesday.  2. Weakness: Continue medical management. Consider PT/OT when appropriate.   4. Psychosocial: Emotional support provided to patient and family.    Brief HPI: 73 yo female with CKD stage V requiring hemodialysis with uncontrolled diabetes and HTN (LUA brachiocephalic fistula placed 91/63/84) and PVD. Admitted through ED with progressively worsening discoloration of the right hallux with throbbing pain and cold to touch. Pain is described as constant, 10/10, with no relieving factors. [name] is now facing right AKA (planned for Wednesday). She also has left 5th digit discoloration with left brachiocephalic fistula ligation 3/28, cellulitis of right foot, systolic CHF,  COPD, and malnutrition.     ROS: + pain, denies nausea/constipation/dyspnea    PMH:  Past Medical History  Diagnosis Date  . CAD (coronary artery disease)   . Hypertension   . Diabetes mellitus     type 2 insulin dependent  . Hyperlipidemia   . CHF (congestive heart failure)   . Cellulitis     left foot  . Ulcer     diabetic ulcer on left foot  . DVT (deep venous thrombosis)     peroneal vein  . Peripheral vascular disease   . Chronic kidney disease   . Anemia   .  Shortness of breath     with fluid  . Insomnia      PSH: Past Surgical History  Procedure Laterality Date  . Tubal ligation    . Pr vein bypass graft,aorto-fem-pop  06-26-11    1. PATENT LEFT FEMORAL-POPLITEAL BYPASS GRAFT W/NO EVEIDENCE OF STENOSIS. 2.VELOCITIES OF GREATER THAN 200 CM/'s NOTED ON PREVIOUS EXAM 10/03/11 WERE NOT ADEQUATELY VISULAIZED DURING THIS EXAM  . Coronary artery bypass graft  04/25/08    Dr. Servando Snare, CABG x 5   . Nm myoview ltd  01/13/13    LEXISCAN; LV WALL MOTION: LVEF 44%, INFERIOR AKINESIS, ANTEROAPICAL HYPOKINESIS  . Cardiac catheterization  04/22/08    SEVERE 3-VESSEL DISEASE CAD., CABG X 5 04/25/08 WITH DR. ZWCHENID  . Av fistula placement Left 09/07/2013    Procedure: ARTERIOVENOUS (AV) FISTULA CREATION;  Surgeon: Elam Dutch, MD;  Location: [facility] OR;  Service: Vascular;  Laterality: Left;   I have reviewed the Robinson and SH and  If appropriate update it with new information. Allergies  Allergen Reactions  . Ivp Dye [Iodinated Diagnostic Agents] Hives and Rash  . Aspirin Hives   Scheduled Meds: . amLODipine  10 mg Oral Daily  . [START ON 01/12/2014] darbepoetin (ARANESP) injection - DIALYSIS  60 mcg Intravenous Q Wed-HD  . doxercalciferol  2 mcg Intravenous Q M,W,F-HD  . enoxaparin (LOVENOX) injection  30 mg Subcutaneous Q24H  . feeding supplement (NEPRO CARB STEADY)  237 mL Oral TID WC  . insulin aspart  0-15 Units Subcutaneous TID WC  . labetalol  100 mg Oral BID  . lanthanum  500 mg Oral TID WC  . piperacillin-tazobactam (ZOSYN)  IV  2.25 g Intravenous 3 times per day  . polyethylene glycol  17 g Oral Daily  . sodium chloride  3 mL Intravenous Q12H   Continuous Infusions:  PRN Meds:.sodium chloride, hydrALAZINE, morphine injection, oxyCODONE, oxyCODONE-acetaminophen, sodium chloride    BP 161/79  Pulse 79  Temp(Src) 98.2 F (36.8 C) (Oral)  Resp 18  Ht 5' 3"  (1.6 m)  Wt 73.1 kg (161 lb 2.5 oz)  BMI 28.55 kg/m2  SpO2 100%   PPS:  40%   Intake/Output Summary (Last 24 hours) at 01/10/14 1619 Last data filed at 01/10/14 1230  Gross per 24 hour  Intake    153 ml  Output      0 ml  Net    153 ml   LBM: 01/10/14                        Physical Exam:  General: NAD, awake, alert HEENT: [place]/AT, moist mucous membranes Chest: CTA throughout, no labored breathing CVS: RRR, S1 S2 Abdomen: Soft, NT, ND, +BS Ext: Right foot cold/discolored/edematous/tender to touch, left 5th digit discolored Neuro: Awake, alert, oriented to person/place/situation somewhat, episodes of confusion, follows commands  Labs: CBC    Component Value Date/Time   WBC 17.2* 01/10/2014 0432   RBC 3.63* 01/10/2014 0432   HGB 9.8* 01/10/2014 0432   HCT 30.4* 01/10/2014 0432   PLT 231 01/10/2014 0432   MCV 83.7 01/10/2014 0432   MCH 27.0 01/10/2014 0432   MCHC 32.2 01/10/2014 0432   RDW 17.8* 01/10/2014 0432   LYMPHSABS 1.0 12/31/2013 1946   MONOABS 0.3 12/31/2013 1946   EOSABS 0.0 12/31/2013 1946   BASOSABS 0.0 12/31/2013 1946    BMET    Component Value Date/Time   NA 143 01/10/2014 0432   K 3.7 01/10/2014 0432   CL 98 01/10/2014 0432   CO2 25 01/10/2014 0432   GLUCOSE 112* 01/10/2014 0432   BUN 30* 01/10/2014 0432   CREATININE 3.78* 01/10/2014 0432   CALCIUM 9.0 01/10/2014 0432   CALCIUM 8.7 07/01/2011 1831   GFRNONAA 11* 01/10/2014 0432   GFRAA 13* 01/10/2014 0432    CMP     Component Value Date/Time   NA 143 01/10/2014 0432   K 3.7 01/10/2014 0432   CL 98 01/10/2014 0432   CO2 25 01/10/2014 0432   GLUCOSE 112* 01/10/2014 0432   BUN 30* 01/10/2014 0432   CREATININE 3.78* 01/10/2014 0432   CALCIUM 9.0 01/10/2014 0432   CALCIUM 8.7 07/01/2011 1831   PROT 6.6 01/01/2014 1522   ALBUMIN 1.8* 01/10/2014 0432   AST 15 01/01/2014 1522   ALT 11 01/01/2014 1522   ALKPHOS 84 01/01/2014 1522   BILITOT 0.3 01/01/2014 1522   GFRNONAA 11* 01/10/2014 0432   GFRAA 13* 01/10/2014 0432     Time In Time Out Total Time Spent with Patient Total Overall Time  1430 1615  33mn 1036m    Greater than 50%  of this time was spent counseling and coordinating care related to the above assessment and plan.   AlVinie SillNP Palliative Medicine Team Pager # [phone]-F 8a-5p) Team Phone # [phone]/Weekends)

## 2014-01-10 NOTE — Progress Notes (Signed)
Assisted back to bed after being in chair by PT, assisted to [facility] for loose BM, skin care peri care done, pt is 2+assist from sitting position, does not straighten back, left in bed with callbell in reach, Berle Mull RN

## 2014-01-10 NOTE — Progress Notes (Signed)
01/10/2014 10:32 AM Hemodialysis Outpatient Note; this patient has been accepted at the [facility] on a Tuesday,Thursday and Saturday 2nd shift schedule. She can begin treating at the center on Tuesday March 31,2015. Patient will need to arrive at the center at 10:30 to sign paperwork and consents. If patient has any confusion please have a family member with her to assist with the paperwork. Thank you. Tilman Neat

## 2014-01-10 NOTE — Progress Notes (Signed)
INITIAL NUTRITION ASSESSMENT  DOCUMENTATION CODES Per approved criteria  -Not Applicable   INTERVENTION: - Nepro shakes TID - Encouraged increased meal intake - Nutrition per goals of care/palliative meeting - RD to continue to monitor   NUTRITION DIAGNOSIS: Inadequate oral intake related to poor appetite as evidenced by <25% meal intake.   Goal: Pt to consume >90% of meals/supplements  Monitor:  Weights, labs, intake, goals of care  Reason for Assessment: Low braden  73 y.o. female  Admitting Dx: Atherosclerotic PVD with ulceration  ASSESSMENT: Pt with multiple, complex medical problems including CKD stage V from uncontrolled diabetes and HTN , PVD who presented with an ischemic right foot. Pateint underwent a arteriogram of her LE which found no good options for revascularization. During hospitalization she had complaints of left hand numbness in the tips of her fingers. MRI brain was obtained showing no acute infarct. On 01/05/14 rapid response was called for decreased mental status. Pt started dialysis, this admission, on 3/25 for ESRD and is s/p 3rd HD this past Saturday. Plan is likely for right AKA. Palliative care met with pt and family today. Per discussion with Elmo Putt NP, pt verbalized that she did not want artifical nutrition. Pt with ischemic steal of left hand.   Met with pt, who was alone in room, who reports poor appetite for the past 2 weeks with pt consuming only small amounts of food per day like some applesauce or peaches. Reports losing "right much weight" since she started having vision problems but was unable to state usual body weight or amount of weight loss. Before 2 weeks ago pt states she was eating well, 3 meals/day with good appetite. Observed that pt only had a few bites of lunch tray. Agreeable to getting Nepro shakes. Pt declined nutrition focused physical exam.   BUN/Cr elevated with low GFR Phosphorus elevated   Height: Ht Readings from Last 1  Encounters:  01/05/14 5' 3"  (1.6 m)    Weight: Wt Readings from Last 1 Encounters:  01/09/14 161 lb 2.5 oz (73.1 kg)    Ideal Body Weight: 115 lbs  % Ideal Body Weight: 140%  Wt Readings from Last 10 Encounters:  01/09/14 161 lb 2.5 oz (73.1 kg)  01/09/14 161 lb 2.5 oz (73.1 kg)  01/09/14 161 lb 2.5 oz (73.1 kg)  12/30/13 153 lb (69.4 kg)  12/22/13 152 lb 9.6 oz (69.219 kg)  12/14/13 153 lb 14.4 oz (69.809 kg)  11/11/13 148 lb (67.132 kg)  10/04/13 139 lb (63.05 kg)  09/07/13 138 lb (62.596 kg)  09/07/13 138 lb (62.596 kg)    Usual Body Weight: 148 lbs in January 2015  % Usual Body Weight: 109%  BMI:  Body mass index is 28.55 kg/(m^2).  Estimated Nutritional Needs: Kcal: 1550-1750 Protein: 63-73g Fluid: per MD  Skin: +1 generalized edema, +2 RLE, LLE edema, +1 facial edema, stage 2 sacral pressure ulcer, left hand incision, necrotic right great toe   Diet Order: Diabetic  EDUCATION NEEDS: -No education needs identified at this time   Intake/Output Summary (Last 24 hours) at 01/10/14 1519 Last data filed at 01/10/14 1230  Gross per 24 hour  Intake    153 ml  Output      0 ml  Net    153 ml    Last BM: 3/27  Labs:   Recent Labs Lab 01/08/14 0353 01/09/14 0320 01/10/14 0432  NA 138 141 143  K 3.8 4.2 3.7  CL 94* 96 98  CO2 20  22 25  BUN 40* 20 30*  CREATININE 3.97* 2.80* 3.78*  CALCIUM 8.9 9.2 9.0  PHOS 3.9 4.0 4.7*  GLUCOSE 77 92 112*    CBG (last 3)   Recent Labs  01/10/14 0007 01/10/14 0617 01/10/14 1114  GLUCAP 120* 110* 125*    Scheduled Meds: . amLODipine  10 mg Oral Daily  . [START ON 01/12/2014] darbepoetin (ARANESP) injection - DIALYSIS  60 mcg Intravenous Q Wed-HD  . doxercalciferol  2 mcg Intravenous Q M,W,F-HD  . enoxaparin (LOVENOX) injection  30 mg Subcutaneous Q24H  . insulin aspart  0-15 Units Subcutaneous TID WC  . labetalol  100 mg Oral BID  . lanthanum  500 mg Oral TID WC  . piperacillin-tazobactam (ZOSYN)  IV   2.25 g Intravenous 3 times per day  . polyethylene glycol  17 g Oral Daily  . sodium chloride  3 mL Intravenous Q12H    Continuous Infusions:   Past Medical History  Diagnosis Date  . CAD (coronary artery disease)   . Hypertension   . Diabetes mellitus     type 2 insulin dependent  . Hyperlipidemia   . CHF (congestive heart failure)   . Cellulitis     left foot  . Ulcer     diabetic ulcer on left foot  . DVT (deep venous thrombosis)     peroneal vein  . Peripheral vascular disease   . Chronic kidney disease   . Anemia   . Shortness of breath     with fluid  . Insomnia     Past Surgical History  Procedure Laterality Date  . Tubal ligation    . Pr vein bypass graft,aorto-fem-pop  06-26-11    1. PATENT LEFT FEMORAL-POPLITEAL BYPASS GRAFT W/NO EVEIDENCE OF STENOSIS. 2.VELOCITIES OF GREATER THAN 200 CM/'s NOTED ON PREVIOUS EXAM 10/03/11 WERE NOT ADEQUATELY VISULAIZED DURING THIS EXAM  . Coronary artery bypass graft  04/25/08    Dr. Servando Snare, CABG x 5   . Nm myoview ltd  01/13/13    LEXISCAN; LV WALL MOTION: LVEF 44%, INFERIOR AKINESIS, ANTEROAPICAL HYPOKINESIS  . Cardiac catheterization  04/22/08    SEVERE 3-VESSEL DISEASE CAD., CABG X 5 04/25/08 WITH DR. [id number]  . Av fistula placement Left 09/07/2013    Procedure: ARTERIOVENOUS (AV) FISTULA CREATION;  Surgeon: Elam Dutch, MD;  Location: [place];  Service: Vascular;  Laterality: Left;    Mikey College MS, [place], [place] Pager [phone] After Hours Pager

## 2014-01-10 NOTE — Progress Notes (Signed)
Patient ID: [name], female   DOB: 1941-09-09, 73 y.o.   MRN: [id number]   [facility] Progress Note    Subjective:   Events noted- patient found to have ischemic fifth digit more pronounced after her 3rd dialysis treatment Saturday, went to the OR for ligation of AVF and PC. Notes reviewed from VVS, Palliative Care and it appears discussions to be held about L AKA vs no surgery and comfort.   Discussions regarding POA and decision making hierarchy for patient also reviewed.  I spent some time with patient - she asked "why can't they fix my foot and finer?" Used the "rusty pipes" analogy.  No good "fix" She seems to lean toward pain control and no amputation - "I just want to go on home" "None of this is going to make me whole again".  Daughter not in the room with pt at present - I asked if she shared this with daughter - she says "she hears me and then goes on out" Pain in foot and finger severe    Objective:   BP 180/85  Pulse 88  Temp(Src) 98.4 F (36.9 C) (Oral)  Resp 19  Ht 5\' 3"  (1.6 m)  Wt 73.1 kg (161 lb 2.5 oz)  BMI 28.55 kg/m2  SpO2 98%  Intake/Output Summary (Last 24 hours) at 01/10/14 0710 Last data filed at 01/09/14 1844  Gross per 24 hour  Intake    223 ml  Output      0 ml  Net    223 ml   Weight change:   Physical Exam: Gen: Comfortably resting in bed; awakens; speaking logically; right sided TDC with dressing intact (3/28) CVS: Pulse regular in rate and rhythm, S1 and S2 with an ejection systolic murmur Resp: Clear to auscultation bilaterally, no rales/rhonchi Abd: Soft, flat, nontender Ext:  ischemic changes RLE- really throughout foot/bullae toes. Positive edema.  Left upper arm AVF ligated- fifth digit dusky/tip dry gangrene  with some ruborous areas of 4th digit as well  Imaging: Dg Chest Port 1 View  01/09/2014   CLINICAL DATA:  Central line placement  EXAM: PORTABLE CHEST - 1 VIEW  COMPARISON:  01/01/2014  FINDINGS: Dual-lumen  central line, tip at the level of the upper right atrium. No evidence of pneumothorax.  Unchanged cardiopericardial enlargement. Status post CABG. Worsening diffuse interstitial opacity. There are small layering pleural effusions, right more than left.  IMPRESSION: 1. Right IJ central line with the lowest catheter tip in the upper right atrium. No pneumothorax. 2. Worsening CHF.   Electronically Signed   By: Tiburcio Pea M.D.   On: 01/09/2014 02:43   Dg Fluoro Guide Cv Line-no Report  01/09/2014   CLINICAL DATA: check for diatek placement   FLOURO GUIDE CV LINE  Fluoroscopy was utilized by the requesting physician.  No radiographic  interpretation.     Labs: BMET  Recent Labs Lab 01/04/14 0550 01/05/14 0307 01/06/14 0421 01/07/14 0320 01/08/14 0353 01/09/14 0320 01/10/14 0432  NA 133* 132* 130* 136* 138 141 143  K 5.5* 5.3 4.3 4.4 3.8 4.2 3.7  CL 95* 93* 91* 96 94* 96 98  CO2 15* 16* 18* 19 20 22 25   GLUCOSE 161* 94 102* 82 77 92 112*  BUN 104* 105* 82* 84* 40* 20 30*  CREATININE 6.03* 6.65* 5.67* 6.05* 3.97* 2.80* 3.78*  CALCIUM 9.1 8.5 8.2* 8.5 8.9 9.2 9.0  PHOS 7.8* 7.1* 5.1* 5.8* 3.9 4.0 4.7*   CBC  Recent  Labs Lab 01/04/14 0550 01/08/14 0353 01/09/14 0320 01/10/14 0432  WBC 14.1* 15.9* 18.4* 17.2*  HGB 9.7* 10.5* 10.8* 9.8*  HCT 29.6* 32.4* 33.4* 30.4*  MCV 81.3 82.4 83.9 83.7  PLT 246 244 267 231   Medications:    . amLODipine  10 mg Oral Daily  . [START ON 01/12/2014] darbepoetin (ARANESP) injection - DIALYSIS  60 mcg Intravenous Q Wed-HD  . doxercalciferol  2 mcg Intravenous Q M,W,F-HD  . enoxaparin (LOVENOX) injection  30 mg Subcutaneous Q24H  . insulin aspart  0-15 Units Subcutaneous TID WC  . labetalol  100 mg Oral BID  . lanthanum  500 mg Oral TID WC  . piperacillin-tazobactam (ZOSYN)  IV  2.25 g Intravenous 3 times per day  . polyethylene glycol  17 g Oral Daily  . sodium chloride  3 mL Intravenous Q12H    Assessment/ Plan:   1. Chronic kidney  disease stage V with contrast exposure: Now ESRD- initiated dialysis therapy this admit.   S/p third HD Saturday.   CLIP in process   Had AVF which unfortunately needed to be ligated 3/28 secondary to steal- now with PC not appropriate to rush into another access procedure right now.   Will plan TENTATIVELY for HD for Tuesday and keep her on a TTS schedule until a final decision is made about route to take 2. Right foot ischemia:  unfortunately no good options for revasc.   Pain is severe, at times better controlled others.   On broad-spectrum antibiotic therapy with Zosyn for associated cellulitis.  Will need amputation, likely AKA , depending on patient/familydecision as to route they will take for care mgmt. 3. Anemia:  Hemoglobin stable-she is status post intravenous iron therapy for low Tsat .  On aranesp as well 4. Hypertension: on norvasc , likely to be aggravated by pain/fluids. - dialysis has helped - norvasc has been increased- UF with HD 5. Metabolic bone disease: On  fosrenol -  hectorol with HD and follow PTH 6. Steal left hand - intermittent complaints in the past but now has declared and required ligation of AVF;  fifth digit dusky with gangrenous tip; some changes now 4th digit as well; appreciate VVS prompt attention 7. Dispo-  I think a Palliative care meeting might help with the decision making here I think patient herself, who is coherent at this moment, leans toward pain control and a non-aggressive approach   Malloree Raboin B   01/10/2014, 7:10 AM

## 2014-01-10 NOTE — Progress Notes (Signed)
Patient ID: [name], female   DOB: 04-28-1941, 73 y.o.   MRN: [id number] Continuing to follow patient with you Now with cuffed hemodialysis catheter and ligation of fistula Scheduled for hemodialysis tomorrow Biggest complaint is pain in right foot  Patient is on schedule for right AKA if patient and daughter who is PLOA are in agreement We'll continue to follow along with you

## 2014-01-10 NOTE — Progress Notes (Signed)
Thank you for consulting the Palliative Medicine Team at [facility] to meet your patient's and family's needs.   The reason that you asked [name] to see your patient is for GOC and options.   We have scheduled your patient for a meeting: 3/30 1400   Other family members that need to be present: Jonny Ruiz (husband) and Velna Hatchet (dtr)   Your patient is able/unable to participate: hopefully able to an extent  Additional Narrative: There is question over POA since pt is confused at times. Much family disagreement but Ms. Hildebrandt did tell me and named Jonny Ruiz and Velna Hatchet as who she wanted present and to be a part of this conversation about her plan and goals of care. I have spoken with Velna Hatchet and she will help Jonny Ruiz come if he is able or we will include him via telephone. Ms. Pelle did tell me in advance that Jonny Ruiz has his own health concerns and may not be able to be here in person but she would like him to be present via telephone if not in person.   Yong Channel, NP Palliative Medicine Team Pager # [phone] (M-F 8a-5p) Team Phone # [phone] (Nights/Weekends)

## 2014-01-10 NOTE — Progress Notes (Signed)
Patient ID: [name], female   DOB: Jul 26, 1941, 73 y.o.   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 No reported fevers on admission, no chest pain or shortness of breath. Main concern raised is management of the right ischemic hallux as iodinated contrast is required, risk being progression to ESRD and requiring HD. The patient has a left upper arm brachiocephalic fistula placed 09/07/2013.    Atherosclerotic PVD with ulceration - plan for surgery this wed, patient wants surgery and tells me this morning "do what you have to do". Left 5th digit discoloration - due to ischemic steal, noted after HD, now s/p left brachiocephalic fistula ligation on 3/28. Darker today Cellulitis of the right foot - continue Zosyn day #9, we'll have to continue until surgery Left arm numbness - MRI negative for acute events but chronic ischemic changes noted  ?Confusion and transient blindness - neurology consulted, appreciate input. Patient's exam inconsistent per neurology notes.  Chronic systolic congestive heart failure - per 2 D ECHO 3/22 --> EF 30 - 35 %  - undergoing HD per nephrology   CKD (chronic kidney disease), stage IV - on HD now. Hyperkalemia- HD COPD (chronic obstructive pulmonary disease) - clinically compensated, maintaining oxygen saturations at target range  Essential hypertension - continue Imdur, Coreg, Amlodipine  Anemia of chronic disease  - no signs of active bleeding but drop in hg since admission  - aranesp  started per nephrology team  DM type 2, uncontrolled, with renal complications  - reasonable inpatient control - A1C (3/21) 8.1  - continue SSI while inpatient  Leukocytosis - secondary to right hallux ischemia and cellulitis  HLD - continue statin as per home medical regimen  Moderate malnutrition - albumin ~2, secondary to progressive nature of the complex medical conditions outlined above  - Patient eating very little, states that food gives her generalized pain throughout her entire body.  Goals of care - patient with progressive decline in the past 4-5 months, initially her wishes were DNR/DNI and she favored no dialysis last November when she was my patient, but wanted to think about it. She is now unable to make decisions as she has intermittent confusion and her daughter is making her medical decisions, and states that her mother wishes dialysis and to be a full code. Appreciate palliative care consult.   Procedures/Studies:  CXR 01/01/2014 Congestive heart failure pattern which is mildly worsened from prior.  01/03/2014 US-guided access to left common femoral artery, CO2 aortogram, selective catheterization of the right external iliac artery with right lower extremity runoff using CO2  Antibiotics:  Zosyn 3/21 -->   Code Status: Full  Family Communication: Pt and daughter at bedside  Disposition Plan: Home when medically stable, daughter wants to take her home, no SNF; plan for surgery per vascular early next week.   HPI/Subjective: - wants to sit in the chair to eat  Objective: Filed Vitals:   01/09/14 1424 01/09/14 2054 01/10/14 0548 01/10/14 1045  BP: 181/86 180/92 180/85 155/77  Pulse: 88 82 88 86  Temp: 98.3 F (  36.8 C) 98.1 F (36.7 C) 98.4 F (36.9 C)   TempSrc: Oral Oral Oral   Resp: 18 19 19    Height:      Weight:      SpO2: 97% 98% 98%     Intake/Output Summary (Last 24 hours) at 01/10/14 1152 Last data filed at 01/10/14 1046  Gross per 24 hour  Intake     223 ml  Output      0 ml  Net    223 ml   Exam:  General:  Pt is alert, follows commands appropriately, not in acute distress  Cardiovascular: Regular rate and rhythm, S1/S2, no murmurs, no rubs, no gallops  Respiratory: Clear to auscultation bilaterally, no wheezing, no crackles, no rhonchi  Abdomen: Soft, non tender, non distended, bowel sounds present, no guarding  Extremities: Right foot cold and ischemia, edema and tender to palpation. Left 5th digit discolored   Data Reviewed: Basic Metabolic Panel:  Recent Labs Lab 01/06/14 0421 01/07/14 0320 01/08/14 0353 01/09/14 0320 01/10/14 0432  NA 130* 136* 138 141 143  K 4.3 4.4 3.8 4.2 3.7  CL 91* 96 94* 96 98  CO2 18* 19 20 22 25   GLUCOSE 102* 82 77 92 112*  BUN 82* 84* 40* 20 30*  CREATININE 5.67* 6.05* 3.97* 2.80* 3.78*  CALCIUM 8.2* 8.5 8.9 9.2 9.0  PHOS 5.1* 5.8* 3.9 4.0 4.7*   Liver Function Tests:  Recent Labs Lab 01/06/14 0421 01/07/14 0320 01/08/14 0353 01/09/14 0320 01/10/14 0432  ALBUMIN 2.0* 1.9* 2.0* 2.1* 1.8*   CBC:  Recent Labs Lab 01/03/14 1329 01/04/14 0550 01/08/14 0353 01/09/14 0320 01/10/14 0432  WBC 15.0* 14.1* 15.9* 18.4* 17.2*  HGB 10.0* 9.7* 10.5* 10.8* 9.8*  HCT 30.9* 29.6* 32.4* 33.4* 30.4*  MCV 82.2 81.3 82.4 83.9 83.7  PLT 240 246 244 267 231   CBG:  Recent Labs Lab 01/09/14 1702 01/09/14 2136 01/10/14 0007 01/10/14 0617 01/10/14 1114  GLUCAP 113* 114* 120* 110* 125*   Scheduled Meds: . amLODipine  10 mg Oral Daily  . [START ON 01/12/2014] darbepoetin (ARANESP) injection - DIALYSIS  60 mcg Intravenous Q Wed-HD  . doxercalciferol  2 mcg Intravenous Q M,W,F-HD  . enoxaparin (LOVENOX) injection  30 mg Subcutaneous Q24H  . insulin aspart  0-15 Units Subcutaneous TID WC  . labetalol  100 mg Oral BID  . lanthanum  500 mg Oral TID WC  . piperacillin-tazobactam (ZOSYN)  IV  2.25 g Intravenous 3 times per day  . polyethylene glycol  17 g Oral Daily  . sodium chloride   3 mL Intravenous Q12H   Continuous Infusions:   [name], [name], MD  Pager [phone] Time spent: 25 minutes.  If 7PM-7AM, please contact night-coverage [email or web address] Password [email or web address] 01/10/2014, 11:52 AM  LOS: 10 days

## 2014-01-10 NOTE — Evaluation (Signed)
Physical Therapy Evaluation Patient Details Name: [name] MRN: [id number] DOB: 1941-08-06 Today's Date: 01/10/2014   History of Present Illness  pt admitted with claudication ischemia of R foot  Clinical Impression  Pt presents with impaired mobility, gait and balance and will benefit from skilled acute PT services to address deficits and increase functional independence    Follow Up Recommendations Home health PT;Supervision/Assistance - 24 hour;SNF    Equipment Recommendations  Other (comment) (TBD, if pt goes home will need RW, w/c, BSC)    Recommendations for Other Services       Precautions / Restrictions Precautions Precautions: Fall Restrictions Weight Bearing Restrictions: No      Mobility  Bed Mobility Overal bed mobility: Needs Assistance Bed Mobility: Supine to Sit     Supine to sit: Supervision     General bed mobility comments: cues for safety, increased time  Transfers Overall transfer level: Needs assistance Equipment used: Rolling walker (2 wheeled) Transfers: Sit to/from [employer] Sit to Stand: Min assist Stand pivot transfers: Mod assist       General transfer comment: pt requires cues for hand placement, lifting assist and assist for wt shift to pivot into recliner, cues for upright posture  Ambulation/Gait                Stairs            Wheelchair Mobility    Modified Rankin (Stroke Patients Only)       Balance                                     Pertinent Vitals/Pain No c/o pain during eval    Home Living Family/patient expects to be discharged to:: Private residence Living Arrangements: Alone     Home Access: Level entry     Home Layout: One level   Additional Comments: pt states "I never had to use a walker or get any help, I don't understand why it's different now"    Prior Function Level of Independence: Independent               Hand Dominance         Extremity/Trunk Assessment   Upper Extremity Assessment: Generalized weakness           Lower Extremity Assessment: Generalized weakness;RLE deficits/detail      Cervical / Trunk Assessment: Normal  Communication   Communication:  (memory impairments)  Cognition Arousal/Alertness: Awake/alert Behavior During Therapy: WFL for tasks assessed/performed Overall Cognitive Status: No family/caregiver present to determine baseline cognitive functioning (decreased memory and awareness of deficits)                      General Comments      Exercises        Assessment/Plan    PT Assessment Patient needs continued PT services  PT Diagnosis Difficulty walking;Generalized weakness   PT Problem List Decreased strength;Decreased activity tolerance;Decreased balance;Decreased mobility;Decreased safety awareness;Decreased knowledge of use of DME  PT Treatment Interventions Cognitive remediation;DME instruction;Gait training;Patient/family education;Stair training;Wheelchair mobility training;Functional mobility training;Therapeutic activities;Modalities;Therapeutic exercise;Balance training;Neuromuscular re-education   PT Goals (Current goals can be found in the Care Plan section) Acute Rehab PT Goals Patient Stated Goal: get into the chair so I can eat PT Goal Formulation: With patient Time For Goal Achievement: 01/17/14 Potential to Achieve Goals: Good    Frequency Min 3X/week  Barriers to discharge Decreased caregiver support pt lives alone    End of Session Equipment Utilized During Treatment: Gait belt Activity Tolerance: Patient tolerated treatment well Patient left: in chair;with call bell/phone within reach         Time: 1050-1105 PT Time Calculation (min): 15 min   Charges:   PT Evaluation $Initial PT Evaluation Tier I: 1 Procedure     PT G Codes:          Fleta Borgeson 01/10/2014, 11:14 AM

## 2014-01-11 ENCOUNTER — Encounter (HOSPITAL_COMMUNITY): Payer: Self-pay | Admitting: Vascular Surgery

## 2014-01-11 DIAGNOSIS — I739 Peripheral vascular disease, unspecified: Secondary | ICD-10-CM

## 2014-01-11 DIAGNOSIS — N184 Chronic kidney disease, stage 4 (severe): Secondary | ICD-10-CM

## 2014-01-11 DIAGNOSIS — L98499 Non-pressure chronic ulcer of skin of other sites with unspecified severity: Secondary | ICD-10-CM

## 2014-01-11 LAB — GLUCOSE, CAPILLARY
Glucose-Capillary: 156 mg/dL — ABNORMAL HIGH (ref 70–99)
Glucose-Capillary: 163 mg/dL — ABNORMAL HIGH (ref 70–99)
Glucose-Capillary: 166 mg/dL — ABNORMAL HIGH (ref 70–99)
Glucose-Capillary: 152 mg/dL — ABNORMAL HIGH (ref 70–99)

## 2014-01-11 LAB — CBC
HCT: 31.3 % — ABNORMAL LOW (ref 36.0–46.0)
Hemoglobin: 10 g/dL — ABNORMAL LOW (ref 12.0–15.0)
MCH: 27.2 pg (ref 26.0–34.0)
MCHC: 31.9 g/dL (ref 30.0–36.0)
MCV: 85.1 fL (ref 78.0–100.0)
Platelets: 201 10*3/uL (ref 150–400)
RBC: 3.68 MIL/uL — ABNORMAL LOW (ref 3.87–5.11)
RDW: 18 % — AB (ref 11.5–15.5)
WBC: 16.2 10*3/uL — ABNORMAL HIGH (ref 4.0–10.5)

## 2014-01-11 LAB — RENAL FUNCTION PANEL
ALBUMIN: 1.6 g/dL — AB (ref 3.5–5.2)
BUN: 38 mg/dL — ABNORMAL HIGH (ref 6–23)
CALCIUM: 8.8 mg/dL (ref 8.4–10.5)
CO2: 25 mEq/L (ref 19–32)
Chloride: 99 mEq/L (ref 96–112)
Creatinine, Ser: 4.46 mg/dL — ABNORMAL HIGH (ref 0.50–1.10)
GFR, EST AFRICAN AMERICAN: 11 mL/min — AB (ref >90)
GFR, EST NON AFRICAN AMERICAN: 9 mL/min — AB (ref >90)
Glucose, Bld: 157 mg/dL — ABNORMAL HIGH (ref 70–99)
PHOSPHORUS: 4.5 mg/dL (ref 2.3–4.6)
Potassium: 3.6 mEq/L — ABNORMAL LOW (ref 3.7–5.3)
SODIUM: 142 meq/L (ref 137–147)

## 2014-01-11 MED ORDER — DEXTROSE 5 % IV SOLN
1.5000 g | INTRAVENOUS | Status: AC
  Filled 2014-01-11: qty 1.5

## 2014-01-11 MED ORDER — DARBEPOETIN ALFA-POLYSORBATE 60 MCG/0.3ML IJ SOLN
60.0000 ug | INTRAMUSCULAR | Status: DC
  Administered 2014-01-11: 60 ug via INTRAVENOUS
  Filled 2014-01-11: qty 0.3

## 2014-01-11 MED ORDER — DARBEPOETIN ALFA-POLYSORBATE 60 MCG/0.3ML IJ SOLN
INTRAMUSCULAR | Status: AC
  Filled 2014-01-11: qty 0.3

## 2014-01-11 NOTE — Progress Notes (Signed)
Patient ID: [name], female   DOB: 08-Jul-1941, 73 y.o.   MRN: [id number] Vascular Surgery Progress Note  Subjective: Severe ischemia right distal foot-complaining of rest pain-alert talking to me today  Objective:  Filed Vitals:   01/11/14 0510  BP: 150/68  Pulse: 72  Temp: 98.3 F (36.8 C)  Resp: 18    General alert and oriented x3 Right foot stable with ischemia distal foot Lungs no rhonchi or wheezing   Labs:  Recent Labs Lab 01/09/14 0320 01/10/14 0432 01/11/14 0500  CREATININE 2.80* 3.78* 4.46*    Recent Labs Lab 01/09/14 0320 01/10/14 0432 01/11/14 0500  NA 141 143 142  K 4.2 3.7 3.6*  CL 96 98 99  CO2 22 25 25   BUN 20 30* 38*  CREATININE 2.80* 3.78* 4.46*  GLUCOSE 92 112* 157*  CALCIUM 9.2 9.0 8.8    Recent Labs Lab 01/09/14 0320 01/10/14 0432 01/11/14 0500  WBC 18.4* 17.2* 16.2*  HGB 10.8* 9.8* 10.0*  HCT 33.4* 30.4* 31.3*  PLT 267 231 201   No results found for this basename: INR,  in the last 168 hours  I/O last 3 completed shifts: In: 493 [P.O.:200; I.V.:3; Other:240; IV Piggyback:50] Out: -   Imaging: No results found.  Assessment/Plan:    LOS: 11 days  s/p Procedure(s): LIGATION ARTERIOVENOUS GORTEX GRAFT INSERTION OF DIALYSIS CATHETER  Plan right AKA in a.m. if all in agreement Discussed with patient today and she would like to proceed to relieve her rest pain   Josephina Gip, MD 01/11/2014 8:59 AM

## 2014-01-11 NOTE — Progress Notes (Signed)
Patient ID: [name], female   DOB: 09/18/1941, 73 y.o.   MRN: [id number]   [facility] Progress Note    Subjective:   Events noted- patient found to have ischemic fifth digit more pronounced after her 3rd dialysis treatment Saturday, went to the OR for ligation of AVF and PC. Notes reviewed from VVS, Palliative Care and it appears discussions to be held about L AKA vs no surgery and comfort.   Discussions regarding POA and decision making hierarchy for patient also reviewed. Much controversy appears to persist within the family dynamic  Patient indicates she feels better overall today Anticipating amputation on Wednesday   Objective:   BP 150/68  Pulse 72  Temp(Src) 98.3 F (36.8 C) (Oral)  Resp 18  Ht 5\' 3"  (1.6 m)  Wt 73.3 kg (161 lb 9.6 oz)  BMI 28.63 kg/m2  SpO2 100%  Intake/Output Summary (Last 24 hours) at 01/11/14 0744 Last data filed at 01/10/14 1725  Gross per 24 hour  Intake    493 ml  Output      0 ml  Net    493 ml   Weight change:   Physical Exam: Gen: Comfortably resting in bed; awakens; speaking logically; right sided TDC with dressing intact (3/28) Miild periorbital edema CVS: Pulse regular in rate and rhythm,  S1 and S2 with an ejection systolic murmur Resp: Clear to auscultation bilaterally, no rales/rhonchi Abd: Soft, flat, nontender Ext:  ischemic changes RLE- really throughout foot/bullae toes. Positive edema.  Left upper arm AVF ligated- fifth digit dusky/tip dry gangrene    Imaging: No results found.  Labs: BMET  Recent Labs Lab 01/05/14 0307 01/06/14 0421 01/07/14 0320 01/08/14 0353 01/09/14 0320 01/10/14 0432 01/11/14 0500  NA 132* 130* 136* 138 141 143 142  K 5.3 4.3 4.4 3.8 4.2 3.7 3.6*  CL 93* 91* 96 94* 96 98 99  CO2 16* 18* 19 20 22 25 25   GLUCOSE 94 102* 82 77 92 112* 157*  BUN 105* 82* 84* 40* 20 30* 38*  CREATININE 6.65* 5.67* 6.05* 3.97* 2.80* 3.78* 4.46*  CALCIUM 8.5 8.2* 8.5 8.9 9.2 9.0 8.8  PHOS  7.1* 5.1* 5.8* 3.9 4.0 4.7* 4.5   CBC  Recent Labs Lab 01/08/14 0353 01/09/14 0320 01/10/14 0432 01/11/14 0500  WBC 15.9* 18.4* 17.2* 16.2*  HGB 10.5* 10.8* 9.8* 10.0*  HCT 32.4* 33.4* 30.4* 31.3*  MCV 82.4 83.9 83.7 85.1  PLT 244 267 231 201   Medications:    . amLODipine  10 mg Oral Daily  . [START ON 01/12/2014] darbepoetin (ARANESP) injection - DIALYSIS  60 mcg Intravenous Q Wed-HD  . doxercalciferol  2 mcg Intravenous Q M,W,F-HD  . enoxaparin (LOVENOX) injection  30 mg Subcutaneous Q24H  . feeding supplement (NEPRO CARB STEADY)  237 mL Oral TID WC  . insulin aspart  0-15 Units Subcutaneous TID WC  . labetalol  100 mg Oral BID  . lanthanum  500 mg Oral TID WC  . piperacillin-tazobactam (ZOSYN)  IV  2.25 g Intravenous 3 times per day  . polyethylene glycol  17 g Oral Daily  . sodium chloride  3 mL Intravenous Q12H    Assessment/ Plan:   1. Chronic kidney disease stage V with contrast exposure:  Now ESRD Initiated dialysis therapy this admit; due again today.   S/p third HD Saturday.   Has been clipped to [facility] 2nd shift when discharge time rolls around  Had AVF which unfortunately  needed to be ligated 3/28 secondary to steal- now with [facility] Not appropriate to rush into another access procedure right now.    2. Right foot ischemia:  No good options for revasc.   Pain is severe, at times better controlled others.   On broad-spectrum antibiotic therapy with Zosyn for associated cellulitis.  Anticipate amputation on Wednesday . 3. Anemia:  Hemoglobin stable-she is status post intravenous iron therapy for low Tsat .  On aranesp as well (60 QWed - change to QTues with HD)  4. Hypertension:  Norvasc;  Aggravated by pain/fluids.  UF with HD  5. Metabolic bone disease: On  fosrenol -  hectorol with HD and follow PTH  6. Steal left hand - intermittent complaints in the past but now has declared and required ligation of AVF;  fifth digit dusky with  gangrenous tip; some changes now 4th digit as well; appreciate VVS prompt attention  7. Dispo - Palliative care involved; family dynamic complicated  Carlina Derks B   01/11/2014, 7:44 AM

## 2014-01-11 NOTE — Progress Notes (Signed)
Progress Note from the Palliative Medicine Team at [facility]  Subjective: I spoke with Ms. Gully today who seems a little more confused and agitated. I tried to explain that I was hoping to meet with her, husband Jenny Reichmann, and daughter [name] to make sure that she is being heard and that her wishes will be carried out. She continues to tell me today that everyone is on the same page and that there is no reason to talk with [name]. I again asked her about who would make decisions for her if she is unable and she tells me Jenny Reichmann and then [place]. I explained that since [name] is who she chooses then it is important for [name] to know her her wishes (I reminded her that she was telling me about not wanting CPR, shock, intubation yesterday and that I spoke with [name] who disagreed and says that Ms. Rineer has consistently expressed otherwise). She is unable to confirm her wishes at this time. Ms. Coglianese tells me that they "will figure it out when that time comes." Ms. Harvie is now being sent to dialysis.   I also met with [name] today and John via telephone. John confirms [name] when she tells me that they have been separated (but legally married) for 20 years and that Jenny Reichmann is not well himself and is referring to [name] to make decisions. [name] talks for an extended time about the family discord and drama between her, her brothers, and her mother. We did get the chance to discuss code status and [name] says that she would want more a comfort path and no code if her mother continues to suffer after surgery tomorrow. She does say that she does not and will not allow her mother to suffer. She becomes tearful and acknowledges that she cannot let her mother go but knows that she will need too. [name] is coming around by at least considering another path focusing on comfort and acknowledging her mother's declining health. I will continue to follow and support this family.    Objective: Allergies  Allergen Reactions  .  Ivp Dye [Iodinated Diagnostic Agents] Hives and Rash  . Aspirin Hives   Scheduled Meds: . amLODipine  10 mg Oral Daily  . [START ON 01/12/2014] cefUROXime (ZINACEF)  IV  1.5 g Intravenous On Call to OR  . darbepoetin      . darbepoetin (ARANESP) injection - DIALYSIS  60 mcg Intravenous Q Tue-HD  . doxercalciferol  2 mcg Intravenous Q M,W,F-HD  . enoxaparin (LOVENOX) injection  30 mg Subcutaneous Q24H  . feeding supplement (NEPRO CARB STEADY)  237 mL Oral TID WC  . insulin aspart  0-15 Units Subcutaneous TID WC  . labetalol  100 mg Oral BID  . lanthanum  500 mg Oral TID WC  . piperacillin-tazobactam (ZOSYN)  IV  2.25 g Intravenous 3 times per day  . polyethylene glycol  17 g Oral Daily  . sodium chloride  3 mL Intravenous Q12H   Continuous Infusions:  PRN Meds:.sodium chloride, hydrALAZINE, morphine injection, oxyCODONE, oxyCODONE-acetaminophen, sodium chloride  BP 144/79  Pulse 84  Temp(Src) 99 F (37.2 C) (Oral)  Resp 18  Ht _0  (1.6 m)  Wt 68.8 kg (151 lb 10.8 oz)  BMI 26.88 kg/m2  SpO2 93%   PPS: 40%     Intake/Output Summary (Last 24 hours) at 01/11/14 2055 Last data filed at 01/11/14 1925  Gross per 24 hour  Intake    250 ml  Output   1397  ml  Net  -1147 ml      LBM: 01/11/14      Physical Exam:  General: NAD, awake, alert  HEENT: [place]/AT, moist mucous membranes  Chest: CTA throughout, no labored breathing  CVS: RRR, S1 S2  Abdomen: Soft, NT, ND, +BS  Ext: Right foot cold/discolored/edematous/tender to touch, left 5th digit discolored  Neuro: Awake, alert, oriented to person/place/situation somewhat, episodes of confusion, follows commands  Labs: CBC    Component Value Date/Time   WBC 16.2* 01/11/2014 0500   RBC 3.68* 01/11/2014 0500   HGB 10.0* 01/11/2014 0500   HCT 31.3* 01/11/2014 0500   PLT 201 01/11/2014 0500   MCV 85.1 01/11/2014 0500   MCH 27.2 01/11/2014 0500   MCHC 31.9 01/11/2014 0500   RDW 18.0* 01/11/2014 0500   LYMPHSABS 1.0 12/31/2013 1946    MONOABS 0.3 12/31/2013 1946   EOSABS 0.0 12/31/2013 1946   BASOSABS 0.0 12/31/2013 1946    BMET    Component Value Date/Time   NA 142 01/11/2014 0500   K 3.6* 01/11/2014 0500   CL 99 01/11/2014 0500   CO2 25 01/11/2014 0500   GLUCOSE 157* 01/11/2014 0500   BUN 38* 01/11/2014 0500   CREATININE 4.46* 01/11/2014 0500   CALCIUM 8.8 01/11/2014 0500   CALCIUM 8.7 07/01/2011 1831   GFRNONAA 9* 01/11/2014 0500   GFRAA 11* 01/11/2014 0500    CMP     Component Value Date/Time   NA 142 01/11/2014 0500   K 3.6* 01/11/2014 0500   CL 99 01/11/2014 0500   CO2 25 01/11/2014 0500   GLUCOSE 157* 01/11/2014 0500   BUN 38* 01/11/2014 0500   CREATININE 4.46* 01/11/2014 0500   CALCIUM 8.8 01/11/2014 0500   CALCIUM 8.7 07/01/2011 1831   PROT 6.6 01/01/2014 1522   ALBUMIN 1.6* 01/11/2014 0500   AST 15 01/01/2014 1522   ALT 11 01/01/2014 1522   ALKPHOS 84 01/01/2014 1522   BILITOT 0.3 01/01/2014 1522   GFRNONAA 9* 01/11/2014 0500   GFRAA 11* 01/11/2014 0500     Assessment and Plan: 1. Code Status: FULL 2. Symptom Control:  1. Pain: Percocet 5/325 mg + oxycodone 5 mg every 6 hours. Morphine prn. Surgery planned for Wednesday.  2. Weakness: Continue medical management. Consider PT/OT when appropriate.  3. Psycho/Social: Emotional support provided to patient and family.  4. Disposition: To be determined on outcomes. Daughter would like to take patient home with her.    Time In Time Out Total Time Spent with Patient Total Overall Time  1445 1600 25mn 725m    Greater than 50%  of this time was spent counseling and coordinating care related to the above assessment and plan.   AlVinie SillNP Palliative Medicine Team Pager # [phone]-F 8a-5p) Team Phone # [phone]/Weekends)

## 2014-01-11 NOTE — Progress Notes (Signed)
Report given to dialysis nurse Bo.    Reita Cliche 01/11/2014 11:01 AM

## 2014-01-11 NOTE — Progress Notes (Addendum)
Patient ID: Rhonda EvesMary P [name], female   DOB: 07/13/1941, 73 y.o.   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 No reported fevers on admission, no chest pain or shortness of breath. Main concern raised is management of the right ischemic hallux as iodinated contrast is required, risk being progression to ESRD and requiring HD. The patient has a left upper arm brachiocephalic fistula placed 09/07/2013.   Patient with intermittent confusion for the past week, likely in the setting of infection, advancing renal failure. She was started on HD and underwent 3 sessions Thu/Fri/Sat and will have one more on Tues prior to surgery.   Atherosclerotic PVD with ulceration - plan for surgery this wed - patient wishes to go ahead with surgery tomorrow after initial hesitation Left hand 5th digit discoloration - due to ischemic steal, noted after HD, now s/p left brachiocephalic fistula ligation on 3/28. Cellulitis of the right foot - continue Zosyn day #10, narrow post op Left arm numbness - MRI negative for acute events but chronic ischemic changes noted  ?Confusion and transient blindness - neurology consulted, appreciate input. Patient's exam inconsistent per neurology notes.  Chronic systolic congestive heart failure - per 2 D ECHO 3/22 --> EF 30 - 35 %  - undergoing HD per nephrology   CKD (chronic kidney disease), stage IV, now ESRD - on HD, first 3 sessions via fistula, now via tunneled cath after fistula ligation.   Hyperkalemia- HD COPD (chronic obstructive pulmonary disease) - clinically compensated, maintaining oxygen saturations at target range  Essential hypertension - continue Imdur, Coreg, Amlodipine  Anemia of chronic disease  - no signs of active bleeding but drop in hg since admission  - aranesp started per nephrology team  DM type 2, uncontrolled, with renal complications  - reasonable inpatient control - A1C (3/21) 8.1  - continue SSI while inpatient  Leukocytosis - secondary to right hallux ischemia and cellulitis  HLD - continue statin as per home medical regimen  Moderate malnutrition - albumin ~2, secondary to progressive nature of the complex medical conditions outlined above  - Patient eating very little, states that food gives her generalized pain throughout her entire body.  Goals of care - patient with progressive decline in the past 4-5 months, initially her wishes were DNR/DNI and she favored no dialysis last November when she was my patient, but wanted to think about it. She is now unable to make decisions as she has intermittent confusion and her daughter is making her medical decisions, and states that her mother wishes dialysis and to be a full code. Appreciate palliative care consult.   Procedures/Studies:  CXR 01/01/2014 Congestive heart failure pattern which is mildly worsened from prior.  01/03/2014 US-guided access to left common femoral artery, CO2 aortogram, selective catheterization of the right external iliac artery with right lower extremity runoff using CO2  Antibiotics:  Zosyn 3/21 -->   Code Status: Full  Family Communication: Pt and daughter at bedside  Disposition Plan: Home when medically stable, daughter wants to take her home, no SNF, surgery Wed  am  HPI/Subjective: - comfortable this morning, sitting in the chair eating breakfast  Objective: Filed Vitals:   01/10/14 1334 01/10/14 2022 01/11/14 0500 01/11/14 0510  BP: 161/79 160/74  150/68  Pulse: 79  88  72  Temp: 98.2 F (36.8 C) 98.8 F (37.1 C)  98.3 F (36.8 C)  TempSrc: Oral Oral  Oral  Resp: 18 18  18   Height:      Weight:   73.3 kg (161 lb 9.6 oz)   SpO2: 100% 98%  100%    Intake/Output Summary (Last 24 hours) at 01/11/14 0817 Last data filed at 01/10/14 1725  Gross per 24 hour  Intake    493 ml  Output      0 ml  Net    493 ml   Exam:  General:  Pt is alert, follows commands appropriately, not in acute distress, sitting in the chair  Cardiovascular: Regular rate and rhythm, S1/S2, no murmurs, no rubs, no gallops  Respiratory: Clear to auscultation bilaterally, no wheezing, no crackles, no rhonchi  Abdomen: Soft, non tender, non distended, bowel sounds present, no guarding  Extremities: Right foot cold and ischemia, edema and tender to palpation. Left 5th digit discolored   Data Reviewed: Basic Metabolic Panel:  Recent Labs Lab 01/07/14 0320 01/08/14 0353 01/09/14 0320 01/10/14 0432 01/11/14 0500  NA 136* 138 141 143 142  K 4.4 3.8 4.2 3.7 3.6*  CL 96 94* 96 98 99  CO2 19 20 22 25 25   GLUCOSE 82 77 92 112* 157*  BUN 84* 40* 20 30* 38*  CREATININE 6.05* 3.97* 2.80* 3.78* 4.46*  CALCIUM 8.5 8.9 9.2 9.0 8.8  PHOS 5.8* 3.9 4.0 4.7* 4.5   Liver Function Tests:  Recent Labs Lab 01/07/14 0320 01/08/14 0353 01/09/14 0320 01/10/14 0432 01/11/14 0500  ALBUMIN 1.9* 2.0* 2.1* 1.8* 1.6*   CBC:  Recent Labs Lab 01/08/14 0353 01/09/14 0320 01/10/14 0432 01/11/14 0500  WBC 15.9* 18.4* 17.2* 16.2*  HGB 10.5* 10.8* 9.8* 10.0*  HCT 32.4* 33.4* 30.4* 31.3*  MCV 82.4 83.9 83.7 85.1  PLT 244 267 231 201   CBG:  Recent Labs Lab 01/10/14 1114 01/10/14 1618 01/10/14 2108 01/11/14 0513 01/11/14 0621  GLUCAP 125* 113* 170* 156* 163*   Scheduled Meds: . amLODipine  10 mg Oral Daily  . darbepoetin (ARANESP) injection - DIALYSIS  60 mcg Intravenous Q Tue-HD  . doxercalciferol  2 mcg Intravenous Q M,W,F-HD  . enoxaparin (LOVENOX) injection  30 mg  Subcutaneous Q24H  . feeding supplement (NEPRO CARB STEADY)  237 mL Oral TID WC  . insulin aspart  0-15 Units Subcutaneous TID WC  . labetalol  100 mg Oral BID  . lanthanum  500 mg Oral TID WC  . piperacillin-tazobactam (ZOSYN)  IV  2.25 g Intravenous 3 times per day  . polyethylene glycol  17 g Oral Daily  . sodium chloride  3 mL Intravenous Q12H   Continuous Infusions:   Pamella Pert, MD  Pager [phone] Time spent: 25 minutes.  If 7PM-7AM, please contact night-coverage [email or web address] Password [facility] 01/11/2014, 8:17 AM  LOS: 11 days

## 2014-01-11 NOTE — Progress Notes (Signed)
RN spoke with pt to get consent signed for scheduled Right AKA with Dr. Hart Rochester, pt refused to sign anythign with out her husband present.  MD on call paged and made aware.  MD on call stated to educate pt of risk of surgery getting canceled if will not sign consent and talk to pt again in the morning. If pt unwilling to sign consent before schedule surgery to contact OR with updates, and possibly move to tomorrow afternoon.  Pt stated "I want husband here when I sign consent", RN offered to call husband but pt stated " no, I want him here and he is to sick to come up here".  RN offered to call daughter listed in chart, pt stated "don't call my daughter she's the problem".  Call light in reach, RN will continue to monitor.    Thane Edu, RN

## 2014-01-11 NOTE — Progress Notes (Signed)
Physical Therapy Treatment Patient Details Name: [name] MRN: [id number] DOB: Apr 21, 1941 Today's Date: 02-03-14    History of Present Illness pt admitted with claudication ischemia of R foot    PT Comments    Pt continues to be motivated to get OOB, min/mod A with SPT to recliner.  Pt requires AAROM for some therex due to ? Cognitive issues with motor planning/sequencing.  Pt with noted decreased strength in L LE.  Follow Up Recommendations  Home health PT;Supervision/Assistance - 24 hour;SNF     Equipment Recommendations  Other (comment) (TBD)    Recommendations for Other Services       Precautions / Restrictions Precautions Precautions: Fall Restrictions Weight Bearing Restrictions: No    Mobility  Bed Mobility         Supine to sit: Supervision     General bed mobility comments: cues for safety, increased time  Transfers   Equipment used: Rolling walker (2 wheeled)   Sit to Stand: Min assist Stand pivot transfers: Mod assist;Min assist       General transfer comment: cues for hand placement and manual facilitation for wt shifts, improves with tactile cues for upright posture  Ambulation/Gait                 Stairs            Wheelchair Mobility    Modified Rankin (Stroke Patients Only)       Balance                                    Cognition Arousal/Alertness: Awake/alert Behavior During Therapy: WFL for tasks assessed/performed Overall Cognitive Status:  (delayed processing)                      Exercises General Exercises - Lower Extremity Ankle Circles/Pumps: AROM;Left;20 reps Heel Slides: AAROM;15 reps;Left Hip ABduction/ADduction: AAROM;10 reps;Left Straight Leg Raises: AROM;15 reps;Left    General Comments        Pertinent Vitals/Pain Pt requests pain meds at end of session due to R toe/foot pain, RN made aware.    Home Living                      Prior Function             PT Goals (current goals can now be found in the care plan section) Progress towards PT goals: Progressing toward goals    Frequency  Min 3X/week    PT Plan Current plan remains appropriate    End of Session Equipment Utilized During Treatment: Gait belt Activity Tolerance: Patient tolerated treatment well Patient left: in chair;with call bell/phone within reach     Time: 0740-0803 PT Time Calculation (min): 23 min  Charges:  $Therapeutic Exercise: 8-22 mins $Therapeutic Activity: 8-22 mins                    G Codes:      Louis Ivery 2014-02-03, 9:07 AM

## 2014-01-12 ENCOUNTER — Encounter (HOSPITAL_COMMUNITY)
Admission: EM | Disposition: A | Discharge status: Skilled Nursing Facility | Payer: Self-pay | Source: Home / Self Care | Attending: Internal Medicine

## 2014-01-12 ENCOUNTER — Inpatient Hospital Stay (HOSPITAL_COMMUNITY): Payer: Medicare Other | Admitting: Certified Registered Nurse Anesthetist

## 2014-01-12 ENCOUNTER — Encounter (HOSPITAL_COMMUNITY): Payer: Self-pay | Admitting: Certified Registered Nurse Anesthetist

## 2014-01-12 ENCOUNTER — Encounter (HOSPITAL_COMMUNITY): Payer: Medicare Other | Admitting: Certified Registered Nurse Anesthetist

## 2014-01-12 DIAGNOSIS — E1129 Type 2 diabetes mellitus with other diabetic kidney complication: Secondary | ICD-10-CM

## 2014-01-12 DIAGNOSIS — E1165 Type 2 diabetes mellitus with hyperglycemia: Secondary | ICD-10-CM

## 2014-01-12 DIAGNOSIS — I739 Peripheral vascular disease, unspecified: Secondary | ICD-10-CM

## 2014-01-12 DIAGNOSIS — L98499 Non-pressure chronic ulcer of skin of other sites with unspecified severity: Secondary | ICD-10-CM

## 2014-01-12 HISTORY — PX: AMPUTATION: SHX166

## 2014-01-12 HISTORY — PX: ABOVE KNEE LEG AMPUTATION: SUR20

## 2014-01-12 LAB — GLUCOSE, CAPILLARY
GLUCOSE-CAPILLARY: 170 mg/dL — AB (ref 70–99)
GLUCOSE-CAPILLARY: 174 mg/dL — AB (ref 70–99)
Glucose-Capillary: 173 mg/dL — ABNORMAL HIGH (ref 70–99)
Glucose-Capillary: 208 mg/dL — ABNORMAL HIGH (ref 70–99)

## 2014-01-12 LAB — RENAL FUNCTION PANEL
Albumin: 1.8 g/dL — ABNORMAL LOW (ref 3.5–5.2)
BUN: 18 mg/dL (ref 6–23)
CALCIUM: 8.9 mg/dL (ref 8.4–10.5)
CHLORIDE: 97 meq/L (ref 96–112)
CO2: 24 meq/L (ref 19–32)
CREATININE: 2.58 mg/dL — AB (ref 0.50–1.10)
GFR calc Af Amer: 20 mL/min — ABNORMAL LOW (ref >90)
GFR, EST NON AFRICAN AMERICAN: 18 mL/min — AB (ref >90)
GLUCOSE: 187 mg/dL — AB (ref 70–99)
Phosphorus: 2.3 mg/dL (ref 2.3–4.6)
Potassium: 3.9 mEq/L (ref 3.7–5.3)
Sodium: 139 mEq/L (ref 137–147)

## 2014-01-12 LAB — CBC
HCT: 31.2 % — ABNORMAL LOW (ref 36.0–46.0)
HEMOGLOBIN: 10 g/dL — AB (ref 12.0–15.0)
MCH: 27 pg (ref 26.0–34.0)
MCHC: 32.1 g/dL (ref 30.0–36.0)
MCV: 84.3 fL (ref 78.0–100.0)
PLATELETS: 203 10*3/uL (ref 150–400)
RBC: 3.7 MIL/uL — AB (ref 3.87–5.11)
RDW: 18.1 % — ABNORMAL HIGH (ref 11.5–15.5)
WBC: 15.5 10*3/uL — AB (ref 4.0–10.5)

## 2014-01-12 SURGERY — AMPUTATION, ABOVE KNEE
Anesthesia: General | Site: Leg Upper | Laterality: Right

## 2014-01-12 MED ORDER — PENTAFLUOROPROP-TETRAFLUOROETH EX AERO: 1.0000 "application " | INHALATION_SPRAY | CUTANEOUS | Status: DC | PRN

## 2014-01-12 MED ORDER — NEPRO/CARBSTEADY PO LIQD
237.0000 mL | ORAL | Status: DC | PRN
  Filled 2014-01-12: qty 237

## 2014-01-12 MED ORDER — DOCUSATE SODIUM 100 MG PO CAPS
100.0000 mg | ORAL_CAPSULE | Freq: Every day | ORAL | Status: DC
  Administered 2014-01-13: 100 mg via ORAL
  Filled 2014-01-12 (×3): qty 1

## 2014-01-12 MED ORDER — ALTEPLASE 2 MG IJ SOLR: 2.0000 mg | Freq: Once | INTRAMUSCULAR | Status: DC | PRN

## 2014-01-12 MED ORDER — HEPARIN SODIUM (PORCINE) 1000 UNIT/ML DIALYSIS
20.0000 [IU]/kg | INTRAMUSCULAR | Status: DC | PRN
Start: 2014-01-12 — End: 2014-01-12

## 2014-01-12 MED ORDER — FENTANYL CITRATE 0.05 MG/ML IJ SOLN
INTRAMUSCULAR | Status: DC | PRN
  Administered 2014-01-12: 25 ug via INTRAVENOUS
  Administered 2014-01-12: 50 ug via INTRAVENOUS
  Administered 2014-01-12: 25 ug via INTRAVENOUS
  Administered 2014-01-12: 50 ug via INTRAVENOUS

## 2014-01-12 MED ORDER — SODIUM CHLORIDE 0.9 % IV SOLN: 100.0000 mL | INTRAVENOUS | Status: DC | PRN

## 2014-01-12 MED ORDER — ONDANSETRON HCL 4 MG/2ML IJ SOLN
INTRAMUSCULAR | Status: AC
  Filled 2014-01-12: qty 2

## 2014-01-12 MED ORDER — METOPROLOL TARTRATE 1 MG/ML IV SOLN: 2.0000 mg | INTRAVENOUS | Status: DC | PRN

## 2014-01-12 MED ORDER — ACETAMINOPHEN 650 MG RE SUPP: 325.0000 mg | RECTAL | Status: DC | PRN

## 2014-01-12 MED ORDER — LIDOCAINE HCL (CARDIAC) 10 MG/ML IV SOLN
INTRAVENOUS | Status: DC | PRN
  Administered 2014-01-12: 80 mg via INTRAVENOUS

## 2014-01-12 MED ORDER — ONDANSETRON HCL 4 MG/2ML IJ SOLN: 4.0000 mg | Freq: Once | INTRAMUSCULAR | Status: DC | PRN

## 2014-01-12 MED ORDER — HEPARIN SODIUM (PORCINE) 1000 UNIT/ML DIALYSIS
20.0000 [IU]/kg | INTRAMUSCULAR | Status: DC | PRN
  Filled 2014-01-12: qty 2

## 2014-01-12 MED ORDER — PHENOL 1.4 % MT LIQD
1.0000 | OROMUCOSAL | Status: DC | PRN
  Filled 2014-01-12: qty 177

## 2014-01-12 MED ORDER — OXYCODONE HCL 5 MG PO TABS
5.0000 mg | ORAL_TABLET | ORAL | Status: DC | PRN
  Administered 2014-01-12 – 2014-01-14 (×4): 10 mg via ORAL
  Administered 2014-01-14 (×3): 5 mg via ORAL
  Administered 2014-01-15: 10 mg via ORAL
  Filled 2014-01-12: qty 1
  Filled 2014-01-12 (×5): qty 2
  Filled 2014-01-12: qty 1
  Filled 2014-01-12: qty 2

## 2014-01-12 MED ORDER — ALUM & MAG HYDROXIDE-SIMETH 200-200-20 MG/5ML PO SUSP: 15.0000 mL | ORAL | Status: DC | PRN

## 2014-01-12 MED ORDER — HYDRALAZINE HCL 20 MG/ML IJ SOLN: 10.0000 mg | INTRAMUSCULAR | Status: DC | PRN

## 2014-01-12 MED ORDER — HEPARIN SODIUM (PORCINE) 1000 UNIT/ML DIALYSIS: 20.0000 [IU]/kg | INTRAMUSCULAR | Status: DC | PRN

## 2014-01-12 MED ORDER — DEXTROSE-NACL 5-0.45 % IV SOLN: INTRAVENOUS | Status: DC

## 2014-01-12 MED ORDER — ARTIFICIAL TEARS OP OINT
TOPICAL_OINTMENT | OPHTHALMIC | Status: AC
  Filled 2014-01-12: qty 3.5

## 2014-01-12 MED ORDER — MAGNESIUM SULFATE 40 MG/ML IJ SOLN
2.0000 g | Freq: Every day | INTRAMUSCULAR | Status: DC | PRN
  Filled 2014-01-12: qty 50

## 2014-01-12 MED ORDER — ONDANSETRON HCL 4 MG/2ML IJ SOLN
INTRAMUSCULAR | Status: DC | PRN
  Administered 2014-01-12: 4 mg via INTRAVENOUS

## 2014-01-12 MED ORDER — PANTOPRAZOLE SODIUM 40 MG PO TBEC
40.0000 mg | DELAYED_RELEASE_TABLET | Freq: Every day | ORAL | Status: DC
  Administered 2014-01-14 – 2014-01-15 (×2): 40 mg via ORAL
  Filled 2014-01-12 (×2): qty 1

## 2014-01-12 MED ORDER — GUAIFENESIN-DM 100-10 MG/5ML PO SYRP: 15.0000 mL | ORAL_SOLUTION | ORAL | Status: DC | PRN

## 2014-01-12 MED ORDER — CEFUROXIME SODIUM 1.5 G IJ SOLR: 1.5000 g | Freq: Two times a day (BID) | INTRAMUSCULAR | Status: DC

## 2014-01-12 MED ORDER — LABETALOL HCL 5 MG/ML IV SOLN
10.0000 mg | INTRAVENOUS | Status: DC | PRN
  Filled 2014-01-12: qty 4

## 2014-01-12 MED ORDER — ACETAMINOPHEN 325 MG PO TABS: 325.0000 mg | ORAL_TABLET | ORAL | Status: DC | PRN

## 2014-01-12 MED ORDER — HYDROMORPHONE HCL PF 1 MG/ML IJ SOLN
0.2500 mg | INTRAMUSCULAR | Status: DC | PRN
  Administered 2014-01-12 (×3): 0.5 mg via INTRAVENOUS

## 2014-01-12 MED ORDER — SODIUM CHLORIDE 0.9 % IV SOLN
INTRAVENOUS | Status: DC | PRN
  Administered 2014-01-12: 09:00:00 via INTRAVENOUS

## 2014-01-12 MED ORDER — LIDOCAINE-PRILOCAINE 2.5-2.5 % EX CREA
1.0000 "application " | TOPICAL_CREAM | CUTANEOUS | Status: DC | PRN
  Filled 2014-01-12: qty 5

## 2014-01-12 MED ORDER — HYDROMORPHONE HCL PF 1 MG/ML IJ SOLN
0.5000 mg | INTRAMUSCULAR | Status: DC | PRN
  Administered 2014-01-12: 1 mg via INTRAVENOUS
  Administered 2014-01-12: 0.5 mg via INTRAVENOUS
  Administered 2014-01-13 (×4): 1 mg via INTRAVENOUS
  Administered 2014-01-14 (×4): 0.5 mg via INTRAVENOUS
  Administered 2014-01-15: 1 mg via INTRAVENOUS
  Administered 2014-01-15: 0.5 mg via INTRAVENOUS
  Administered 2014-01-15: 1 mg via INTRAVENOUS
  Filled 2014-01-12 (×13): qty 1

## 2014-01-12 MED ORDER — HYDROMORPHONE HCL PF 1 MG/ML IJ SOLN
INTRAMUSCULAR | Status: AC
  Administered 2014-01-12: 1 mg via INTRAVENOUS
  Filled 2014-01-12: qty 1

## 2014-01-12 MED ORDER — ONDANSETRON HCL 4 MG/2ML IJ SOLN: 4.0000 mg | Freq: Four times a day (QID) | INTRAMUSCULAR | Status: DC | PRN

## 2014-01-12 MED ORDER — NEPRO/CARBSTEADY PO LIQD: 237.0000 mL | ORAL | Status: DC | PRN

## 2014-01-12 MED ORDER — ENOXAPARIN SODIUM 30 MG/0.3ML ~~LOC~~ SOLN
30.0000 mg | SUBCUTANEOUS | Status: DC
  Filled 2014-01-12: qty 0.3

## 2014-01-12 MED ORDER — LIDOCAINE-PRILOCAINE 2.5-2.5 % EX CREA: 1.0000 "application " | TOPICAL_CREAM | CUTANEOUS | Status: DC | PRN

## 2014-01-12 MED ORDER — HEPARIN SODIUM (PORCINE) 1000 UNIT/ML DIALYSIS: 1000.0000 [IU] | INTRAMUSCULAR | Status: DC | PRN

## 2014-01-12 MED ORDER — 0.9 % SODIUM CHLORIDE (POUR BTL) OPTIME
TOPICAL | Status: DC | PRN
  Administered 2014-01-12: 1000 mL

## 2014-01-12 MED ORDER — OXYCODONE-ACETAMINOPHEN 5-325 MG PO TABS
ORAL_TABLET | ORAL | Status: AC
  Filled 2014-01-12: qty 1

## 2014-01-12 MED ORDER — HYDROMORPHONE HCL PF 1 MG/ML IJ SOLN
INTRAMUSCULAR | Status: AC
  Administered 2014-01-12: 0.5 mg via INTRAVENOUS
  Filled 2014-01-12: qty 1

## 2014-01-12 MED ORDER — LIDOCAINE HCL (PF) 1 % IJ SOLN: 5.0000 mL | INTRAMUSCULAR | Status: DC | PRN

## 2014-01-12 MED ORDER — LIDOCAINE HCL (CARDIAC) 20 MG/ML IV SOLN
INTRAVENOUS | Status: AC
  Filled 2014-01-12: qty 5

## 2014-01-12 MED ORDER — PROPOFOL 10 MG/ML IV BOLUS
INTRAVENOUS | Status: AC
  Filled 2014-01-12: qty 20

## 2014-01-12 MED ORDER — ALTEPLASE 2 MG IJ SOLR
2.0000 mg | Freq: Once | INTRAMUSCULAR | Status: AC | PRN
  Filled 2014-01-12: qty 2

## 2014-01-12 MED ORDER — FENTANYL CITRATE 0.05 MG/ML IJ SOLN
INTRAMUSCULAR | Status: AC
  Filled 2014-01-12: qty 5

## 2014-01-12 MED ORDER — HEPARIN SODIUM (PORCINE) 1000 UNIT/ML DIALYSIS
1000.0000 [IU] | INTRAMUSCULAR | Status: DC | PRN
  Filled 2014-01-12: qty 1

## 2014-01-12 MED ORDER — PROPOFOL 10 MG/ML IV BOLUS
INTRAVENOUS | Status: DC | PRN
  Administered 2014-01-12: 120 mg via INTRAVENOUS

## 2014-01-12 SURGICAL SUPPLY — 52 items
BANDAGE ELASTIC 6 VELCRO ST LF (GAUZE/BANDAGES/DRESSINGS) ×3 IMPLANT
BANDAGE ESMARK 6X9 LF (GAUZE/BANDAGES/DRESSINGS) IMPLANT
BANDAGE GAUZE ELAST BULKY 4 IN (GAUZE/BANDAGES/DRESSINGS) ×6 IMPLANT
BLADE SAW RECIP 87.9 MT (BLADE) ×5 IMPLANT
BNDG CMPR 9X6 STRL LF SNTH (GAUZE/BANDAGES/DRESSINGS)
BNDG COHESIVE 6X5 TAN STRL LF (GAUZE/BANDAGES/DRESSINGS) ×3 IMPLANT
BNDG ESMARK 6X9 LF (GAUZE/BANDAGES/DRESSINGS)
BNDG GAUZE ELAST 4 BULKY (GAUZE/BANDAGES/DRESSINGS) ×2 IMPLANT
CANISTER SUCTION 2500CC (MISCELLANEOUS) ×3 IMPLANT
CLIP TI MEDIUM 6 (CLIP) IMPLANT
COVER SURGICAL LIGHT HANDLE (MISCELLANEOUS) ×3 IMPLANT
CUFF TOURNIQUET SINGLE 24IN (TOURNIQUET CUFF) IMPLANT
CUFF TOURNIQUET SINGLE 34IN LL (TOURNIQUET CUFF) IMPLANT
CUFF TOURNIQUET SINGLE 44IN (TOURNIQUET CUFF) IMPLANT
DRAPE ORTHO SPLIT 77X108 STRL (DRAPES) ×6
DRAPE PROXIMA HALF (DRAPES) IMPLANT
DRAPE SURG ORHT 6 SPLT 77X108 (DRAPES) ×2 IMPLANT
DRSG ADAPTIC 3X8 NADH LF (GAUZE/BANDAGES/DRESSINGS) ×3 IMPLANT
DRSG PAD ABDOMINAL 8X10 ST (GAUZE/BANDAGES/DRESSINGS) ×3 IMPLANT
ELECT REM PT RETURN 9FT ADLT (ELECTROSURGICAL) ×3
ELECTRODE REM PT RTRN 9FT ADLT (ELECTROSURGICAL) ×1 IMPLANT
EVACUATOR 1/8 PVC DRAIN (DRAIN) ×5 IMPLANT
GLOVE BIO SURGEON STRL SZ 6.5 (GLOVE) ×1 IMPLANT
GLOVE BIO SURGEONS STRL SZ 6.5 (GLOVE) ×1
GLOVE BIOGEL PI IND STRL 7.0 (GLOVE) IMPLANT
GLOVE BIOGEL PI INDICATOR 7.0 (GLOVE) ×2
GLOVE SS BIOGEL STRL SZ 7 (GLOVE) ×1 IMPLANT
GLOVE SUPERSENSE BIOGEL SZ 7 (GLOVE) ×2
GOWN STRL REUS W/ TWL LRG LVL3 (GOWN DISPOSABLE) ×3 IMPLANT
GOWN STRL REUS W/TWL LRG LVL3 (GOWN DISPOSABLE) ×9
KIT BASIN OR (CUSTOM PROCEDURE TRAY) ×3 IMPLANT
KIT ROOM TURNOVER OR (KITS) ×3 IMPLANT
NS IRRIG 1000ML POUR BTL (IV SOLUTION) ×3 IMPLANT
PACK GENERAL/GYN (CUSTOM PROCEDURE TRAY) ×3 IMPLANT
PAD ARMBOARD 7.5X6 YLW CONV (MISCELLANEOUS) ×6 IMPLANT
PADDING CAST COTTON 6X4 STRL (CAST SUPPLIES) IMPLANT
SPONGE GAUZE 4X4 12PLY (GAUZE/BANDAGES/DRESSINGS) ×6 IMPLANT
SPONGE GAUZE 4X4 12PLY STER LF (GAUZE/BANDAGES/DRESSINGS) ×2 IMPLANT
STAPLER VISISTAT 35W (STAPLE) ×3 IMPLANT
STOCKINETTE IMPERVIOUS LG (DRAPES) ×3 IMPLANT
SUT SILK 2 0 (SUTURE) ×3
SUT SILK 2 0 SH CR/8 (SUTURE) ×6 IMPLANT
SUT SILK 2-0 18XBRD TIE 12 (SUTURE) ×1 IMPLANT
SUT SILK 3 0 (SUTURE) ×3
SUT SILK 3-0 18XBRD TIE 12 (SUTURE) ×1 IMPLANT
SUT VIC AB 2-0 CT1 18 (SUTURE) ×3 IMPLANT
SUT VIC AB 2-0 CT1 27 (SUTURE) ×6
SUT VIC AB 2-0 CT1 TAPERPNT 27 (SUTURE) ×2 IMPLANT
TOWEL OR 17X24 6PK STRL BLUE (TOWEL DISPOSABLE) ×3 IMPLANT
TOWEL OR 17X26 10 PK STRL BLUE (TOWEL DISPOSABLE) ×3 IMPLANT
UNDERPAD 30X30 INCONTINENT (UNDERPADS AND DIAPERS) ×3 IMPLANT
WATER STERILE IRR 1000ML POUR (IV SOLUTION) ×3 IMPLANT

## 2014-01-12 NOTE — H&P (View-Only) (Signed)
Patient ID: [name], female   DOB: 12/25/1940, 72 y.o.   MRN: [id number] Vascular Surgery Progress Note  Subjective: Severe ischemia right distal foot-complaining of rest pain-alert talking to me today  Objective:  Filed Vitals:   01/11/14 0510  BP: 150/68  Pulse: 72  Temp: 98.3 F (36.8 C)  Resp: 18    General alert and oriented x3 Right foot stable with ischemia distal foot Lungs no rhonchi or wheezing   Labs:  Recent Labs Lab 01/09/14 0320 01/10/14 0432 01/11/14 0500  CREATININE 2.80* 3.78* 4.46*    Recent Labs Lab 01/09/14 0320 01/10/14 0432 01/11/14 0500  NA 141 143 142  K 4.2 3.7 3.6*  CL 96 98 99  CO2 22 25 25  BUN 20 30* 38*  CREATININE 2.80* 3.78* 4.46*  GLUCOSE 92 112* 157*  CALCIUM 9.2 9.0 8.8    Recent Labs Lab 01/09/14 0320 01/10/14 0432 01/11/14 0500  WBC 18.4* 17.2* 16.2*  HGB 10.8* 9.8* 10.0*  HCT 33.4* 30.4* 31.3*  PLT 267 231 201   No results found for this basename: INR,  in the last 168 hours  I/O last 3 completed shifts: In: 493 [P.O.:200; I.V.:3; Other:240; IV Piggyback:50] Out: -   Imaging: No results found.  Assessment/Plan:    LOS: 11 days  s/p Procedure(s): LIGATION ARTERIOVENOUS GORTEX GRAFT INSERTION OF DIALYSIS CATHETER  Plan right AKA in a.m. if all in agreement Discussed with patient today and she would like to proceed to relieve her rest pain   Rhonda Breault, MD 01/11/2014 8:59 AM           

## 2014-01-12 NOTE — Progress Notes (Signed)
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 Hart RochesterLawson Left hand Steal/digital ischemia-  -due to ischemic steal,  -s/p left brachiocephalic fistula ligation on 3/28.  Cellulitis of the right foot -  -Had been on Zosyn since 01/01/2014 -Discontinue antibiotics postop R-AKA Left arm numbness/ Sensory disturbance -  -01/06/2014 -MRI brain negative for acute events but chronic ischemic changes  Confusion -?previously due to uremia - neurology consulted, appreciate input. Patient's exam inconsistent per neurology notes.  Chronic systolic congestive heart failure  -2 D ECHO 3/22 --> EF 30 - 35 %  - undergoing HD per nephrology  CKD (chronic kidney disease), stage V, now ESRD  - on HD, first 3 sessions via fistula, now via PC after fistula ligation.  -appreciate renal followup Hyperkalemia- HD  COPD (chronic obstructive pulmonary disease)  - clinically compensated,  Essential hypertension  -continue Amlodipine  Anemia of chronic disease/CKD - no signs of active bleeding but drop in hg since admission  - aranesp started per nephrology team  DM type 2, uncontrolled, with renal complications  - reasonable inpatient control  - A1C (3/21) 8.1  - continue SSI while inpatient  Leukocytosis - secondary to right hallux ischemia and cellulitis  HLD - continue statin as per home medical regimen  Moderate malnutrition - albumin ~2, secondary to progressive nature of the complex medical conditions outlined above  - Patient eating very little, states that food gives her generalized pain throughout her entire body.  Goals of care - patient with progressive decline in the past 4-5 months, initially her wishes were DNR/DNI and she favored no dialysis last November when she wanted to think about it.    -now unable to make decisions as she has intermittent confusion and her daughter is making her medical decisions, and states that her mother wishes dialysis and to be a full code.  -Appreciate palliative care consult.   Dispo--daughter wants to take home pending CIR input        Procedures/Studies: Dg Chest 2 View  01/01/2014   CLINICAL DATA:  Short of breath upon exertion  EXAM: CHEST  2 VIEW  COMPARISON:  DG CHEST 2 VIEW dated 12/22/2013  FINDINGS: Sternotomy wires overlie enlarged cardiac silhouette. There is mild increase in fine airspace disease in the left and right lung. Bilateral small effusions are present. The right effusion is larger but not changed from prior. No pneumothorax.  IMPRESSION: Congestive heart failure pattern which is mildly worsened from prior. .   Electronically Signed   By: Genevive BiStewart  Edmunds M.D.   On: 01/01/2014 18:11   Dg Chest 2 View  12/22/2013   CLINICAL DATA Fatigue. Shortness of breath. Current history of diabetes and hypertension. Prior CABG.  EXAM CHEST  2 VIEW  COMPARISON DG CHEST 2V dated 11/24/2013; DG CHEST 2V dated 08/27/2013; DG CHEST 2 VIEW dated 08/17/2013; CT CHEST W/O CM dated 02/17/2013; DG CHEST 2V dated 07/28/2012  FINDINGS Cardiac silhouette moderately to markedly enlarged but stable. Thoracic aorta atherosclerotic, unchanged. Hilar and mediastinal contours otherwise unremarkable. Diffuse interstitial and airspace pulmonary edema, superimposed upon COPD/emphysema. Nodular scarring in the right lung apex, unchanged. Moderately large right pleural effusion and associated consolidation in the right lower lobe. No visible left pleural effusion. Degenerative changes involving the thoracic spine.  IMPRESSION Mild CHF superimposed upon COPD/emphysema. Moderate size right pleural effusion and associated passive atelectasis in  the right lower lobe.  SIGNATURE  Electronically Signed   By: Hulan Saas M.D.   On: 12/22/2013 19:46   Ct Head Wo  Contrast  01/05/2014   CLINICAL DATA:  Decreased mental status.  Left hand numbness.  EXAM: CT HEAD WITHOUT CONTRAST  TECHNIQUE: Contiguous axial images were obtained from the base of the skull through the vertex without intravenous contrast.  COMPARISON:  MR HEAD W/O CM dated 01/03/2014; DG ANG/EXT/UNI/OR*L* dated 06/26/2011  FINDINGS: There is no evidence of acute intracranial hemorrhage, mass lesion, brain edema or extra-axial fluid collection. The ventricles and subarachnoid spaces are appropriately sized for age. There is no CT evidence of acute cortical infarction. Mild periventricular white matter disease appears stable. There diffuse intracranial vascular calcifications.  The visualized paranasal sinuses, mastoid air cells and middle ears are clear. The calvarium is intact.  IMPRESSION: Stable appearance of the brain.  No acute intracranial findings.   Electronically Signed   By: Roxy Horseman M.D.   On: 01/05/2014 12:58   Mr Brain Wo Contrast  01/06/2014   CLINICAL DATA:  Evaluate for stroke. Decreased mental status. Left hand numbness.  EXAM: MRI HEAD WITHOUT CONTRAST  TECHNIQUE: Multiplanar, multiecho pulse sequences of the brain and surrounding structures were obtained without intravenous contrast.  COMPARISON:  CT HEAD W/O CM dated 01/05/2014; MR HEAD W/O CM dated 01/03/2014  FINDINGS: No evidence for acute infarction, hemorrhage, mass lesion, hydrocephalus, or extra-axial fluid. Unchanged atrophy with small vessel disease.  No changes suggestive of posterior reversible encephalopathy syndrome.  Post infusion, no abnormal enhancement of the brain or meninges. Low T1 signal intensity bone marrow due to renal disease. Mild pannus. No acute sinus, orbital, or mastoid disease.  IMPRESSION: No acute stroke or changes suggestive of hypertensive encephalopathy.  Atrophy and small vessel disease, stable.   Electronically Signed   By: Davonna Belling M.D.   On: 01/06/2014 21:17   Mr Brain Wo Contrast  01/03/2014    CLINICAL DATA:  Left arm numbness. Evaluate for stroke versus related to recent surgical procedure.  EXAM: MRI HEAD WITHOUT CONTRAST  TECHNIQUE: Multiplanar, multiecho pulse sequences of the brain and surrounding structures were obtained without intravenous contrast.  COMPARISON:  None.  FINDINGS: The patient was unable to remain motionless for the exam. Small or subtle lesions could be overlooked.  No acute stroke is evident. There is generalized atrophy with moderate chronic microvascular ischemic change. Remote right pontine and right basal ganglia lacunes are observed. Diffuse marrow signal abnormality consistent with chronic anemia/chronic renal disease. Cervical spondylosis. Left C1-C2 joint effusion, likely degenerative. Mild pannus.  IMPRESSION: Atrophy.  Chronic microvascular ischemic change.  Remote areas of ischemia without visible acute infarction. No underlying abnormality is seen which might contribute to left arm numbness.   Electronically Signed   By: Davonna Belling M.D.   On: 01/03/2014 17:57   Dg Chest Port 1 View  01/09/2014   CLINICAL DATA:  Central line placement  EXAM: PORTABLE CHEST - 1 VIEW  COMPARISON:  01/01/2014  FINDINGS: Dual-lumen central line, tip at the level of the upper right atrium. No evidence of pneumothorax.  Unchanged cardiopericardial enlargement. Status post CABG. Worsening diffuse interstitial opacity. There are small layering pleural effusions, right more than left.  IMPRESSION: 1. Right IJ central line with the lowest catheter tip in the upper right atrium. No pneumothorax. 2. Worsening CHF.   Electronically Signed   By: Tiburcio Pea M.D.   On: 01/09/2014 02:43   Dg Foot Complete  Right  12/22/2013   CLINICAL DATA Pain and swelling involving the medial right foot and the right great toe. Current history of diabetes.  EXAM RIGHT FOOT COMPLETE - 3+ VIEW  COMPARISON None.  FINDINGS No evidence of acute fracture or dislocation. Mild joint space narrowing involving the  IP joints of multiple toes. Remaining joint spaces well preserved for age. Mild osseous demineralization. Very small enthesopathic spur at the insertion of the Achilles tendon on the posterior calcaneus. No other intrinsic osseous abnormalities. Extensive calcification involving the arteries of the foot.  IMPRESSION No acute osseous abnormality. 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  SIGNATURE  Electronically Signed   By: Hulan Saas M.D.   On: 12/22/2013 19:48   Dg Fluoro Guide Cv Line-no Report  01/09/2014   CLINICAL DATA: check for diatek placement   FLOURO GUIDE CV LINE  Fluoroscopy was utilized by the requesting physician.  No radiographic  interpretation.          Subjective: Patient is pleasantly confused postoperatively. Patient denies any headache, chest pain, shortness breath, nausea, vomiting, abdominal pain. She complains of some pain in the right leg/stump   Objective: Filed Vitals:   01/12/14 1217 01/12/14 1231 01/12/14 1303 01/12/14 1359  BP: 155/74 166/84 158/80 166/75  Pulse: 86  78 78  Temp: 97.4 F (36.3 C) 96.9 F (36.1 C)  97.6 F (36.4 C)  TempSrc: Oral Axillary  Oral  Resp: 17 18 17 18   Height:      Weight:      SpO2: 100% 96% 99% 98%    Intake/Output Summary (Last 24 hours) at 01/12/14 1713 Last data filed at 01/12/14 0951  Gross per 24 hour  Intake    200 ml  Output   1597 ml  Net  -1397 ml   Weight change: -3.2 kg (-7 lb 0.9 oz) Exam:   General:  Pt is alert, follows commands appropriately, not in acute distress  HEENT: No icterus, No thrush,  [place]/AT  Cardiovascular: RRR, S1/S2, no rubs, no gallops  Respiratory: CTA bilaterally, no wheezing, no crackles, no rhonchi  Abdomen: Soft/+BS, non tender, non distended, no guarding  Extremities: R-AKA site wrapped; left pinky finger--dry gangrene  Data Reviewed: Basic Metabolic Panel:  Recent Labs Lab 01/08/14 0353  01/09/14 0320 01/10/14 0432 01/11/14 0500 01/12/14 0400  NA 138 141 143 142 139  K 3.8 4.2 3.7 3.6* 3.9  CL 94* 96 98 99 97  CO2 20 22 25 25 24   GLUCOSE 77 92 112* 157* 187*  BUN 40* 20 30* 38* 18  CREATININE 3.97* 2.80* 3.78* 4.46* 2.58*  CALCIUM 8.9 9.2 9.0 8.8 8.9  PHOS 3.9 4.0 4.7* 4.5 2.3   Liver Function Tests:  Recent Labs Lab 01/08/14 0353 01/09/14 0320 01/10/14 0432 01/11/14 0500 01/12/14 0400  ALBUMIN 2.0* 2.1* 1.8* 1.6* 1.8*   No results found for this basename: LIPASE, AMYLASE,  in the last 168 hours No results found for this basename: AMMONIA,  in the last 168 hours CBC:  Recent Labs Lab 01/08/14 0353 01/09/14 0320 01/10/14 0432 01/11/14 0500 01/12/14 0400  WBC 15.9* 18.4* 17.2* 16.2* 15.5*  HGB 10.5* 10.8* 9.8* 10.0* 10.0*  HCT 32.4* 33.4* 30.4* 31.3* 31.2*  MCV 82.4 83.9 83.7 85.1 84.3  PLT 244 267 231 201 203   Cardiac Enzymes: No results found for this basename: CKTOTAL, CKMB, CKMBINDEX, TROPONINI,  in the last 168 hours BNP: No components found with this  basename: POCBNP,  CBG:  Recent Labs Lab 01/11/14 1116 01/11/14 2057 01/12/14 0805 01/12/14 1011 01/12/14 1620  GLUCAP 166* 152* 170* 173* 174*    Recent Results (from the past 240 hour(s))  SURGICAL PCR SCREEN     Status: None   Collection Time    01/09/14 12:18 AM      Result Value Ref Range Status   MRSA, PCR NEGATIVE  NEGATIVE Final   Staphylococcus aureus NEGATIVE  NEGATIVE Final   Comment:            The Xpert SA Assay (FDA     approved for NASAL specimens     in patients over 9 years of age),     is one component of     a comprehensive surveillance     program.  Test performance has     been validated by [employer] for patients greater     than or equal to 16 year old.     It is not intended     to diagnose infection nor to     guide or monitor treatment.     Scheduled Meds: . amLODipine  10 mg Oral Daily  . cefUROXime (ZINACEF)  IV  1.5 g Intravenous On  Call to OR  . darbepoetin (ARANESP) injection - DIALYSIS  60 mcg Intravenous Q Tue-HD  . [START ON 01/13/2014] docusate sodium  100 mg Oral Daily  . doxercalciferol  2 mcg Intravenous Q M,W,F-HD  . feeding supplement (NEPRO CARB STEADY)  237 mL Oral TID WC  . insulin aspart  0-15 Units Subcutaneous TID WC  . labetalol  100 mg Oral BID  . lanthanum  500 mg Oral TID WC  . oxyCODONE-acetaminophen      . pantoprazole  40 mg Oral Daily  . piperacillin-tazobactam (ZOSYN)  IV  2.25 g Intravenous 3 times per day  . polyethylene glycol  17 g Oral Daily  . sodium chloride  3 mL Intravenous Q12H   Continuous Infusions: . dextrose 5 % and 0.45% NaCl       Janelis Stelzer, DO  [facility] Pager [phone]  If 7PM-7AM, please contact night-coverage [email or web address] Password [email or web address] 01/12/2014, 5:13 PM   LOS: 12 days

## 2014-01-12 NOTE — Interval H&P Note (Signed)
History and Physical Interval Note:  01/12/2014 8:33 AM  [name]  has presented today for surgery, with the diagnosis of Ischemia of right foot Peripheral Arterial Disease  The various methods of treatment have been discussed with the patient and family. After consideration of risks, benefits and other options for treatment, the patient has consented to  Procedure(s): AMPUTATION ABOVE KNEE- RIGHT (Right) as a surgical intervention .  The patient's history has been reviewed, patient examined, no change in status, stable for surgery.  I have reviewed the patient's chart and labs.  Questions were answered to the patient's satisfaction.     [name]
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
## 2014-01-12 NOTE — Anesthesia Postprocedure Evaluation (Signed)
  Anesthesia Post-op Note  Patient: [name]  Procedure(s) Performed: Procedure(s): AMPUTATION ABOVE KNEE- RIGHT (Right)  Patient Location: PACU  Anesthesia Type:General  Level of Consciousness: awake, oriented, sedated and patient cooperative  Airway and Oxygen Therapy: Patient Spontanous Breathing  Post-op Pain: mild  Post-op Assessment: Post-op Vital signs reviewed, Patient's Cardiovascular Status Stable, Respiratory Function Stable, Patent Airway, No signs of Nausea or vomiting and Pain level controlled  Post-op Vital Signs: stable  Complications: No apparent anesthesia complications

## 2014-01-12 NOTE — Progress Notes (Signed)
Pt presented and educated with right AKA consent with Dr. Hart Rochester scheduled for this morning.  Pt stated "I am not signing anything".  Pt resting in bed, call light in reach, RN will continue to monitor.   Thane Edu, RN
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
## 2014-01-12 NOTE — Preoperative (Signed)
Beta Blockers   Reason not to administer Beta Blockers:Not Applicable 

## 2014-01-12 NOTE — Transfer of Care (Signed)
Immediate Anesthesia Transfer of Care Note  Patient: [name]  Procedure(s) Performed: Procedure(s): AMPUTATION ABOVE KNEE- RIGHT (Right)  Patient Location: PACU  Anesthesia Type:General  Level of Consciousness: awake and patient cooperative  Airway & Oxygen Therapy: Patient Spontanous Breathing and Patient connected to nasal cannula oxygen  Post-op Assessment: Report given to PACU RN, Post -op Vital signs reviewed and stable and Patient moving all extremities X 4  Post vital signs: Reviewed and stable  Complications: No apparent anesthesia complications

## 2014-01-12 NOTE — Op Note (Signed)
OPERATIVE REPORT  Date of Surgery: 12/31/2013 - 01/12/2014  Surgeon: Josephina Gip, MD  Assistant: Nurse  Pre-op Diagnosis: Ischemia of right foot with rest pain-no unreconstructable Peripheral Arterial Disease  Post-op Diagnosis: Same  Procedure: Procedure(s): AMPUTATION ABOVE KNEE- RIGHT  Anesthesia: General-LMA EBL: 50 cc  Complications: None  The patient was taken the operating room placed in supine position at which time satisfactory general Lesch LMA anesthesia was administered. The right leg was prepped with Betadine scrub and solution draped in routine sterile manner. Equal anterior and posterior skin flaps were marked basing the femur about 4-5 inches above the knee joint. Incision was made with a scalpel down through skin subcutaneous tissue and the fascia. The muscle was divided with cautery. Superficial femoral artery and vein were individually ligated with 0 silk ties and ligatures and the nerve was ligated and allowed for tract proximally after transecting it. Bone was cleaned proximally the periosteal elevator divided with the Stryker saw and smoothed with a rasp. Posterior muscles were then divided with the Bovie specimen removed from the table. Adequate hemostasis was achieved a medium Hemovac drain brought out medially and laterally secured with silk sutures. The fascia was closed with interrupted 2-0 Vicryl subcutaneous tissue with 2-0 Vicryl and skin with staples sterile dressing applied patient taken to the recovery room in stable condition  Procedure Details:   Josephina Gip, MD 01/12/2014 10:03 AM

## 2014-01-12 NOTE — Consult Note (Signed)
Physical Medicine and Rehabilitation Consult Reason for Consult: Right AKA Referring Physician: [facility]   HPI: [name] is a 73 y.o. right-handed female with end-stage renal disease requiring hemodialysis, CAD, systolic congestive heart failure, diabetes mellitus or peripheral neuropathy and peripheral vascular disease. Admitted 12/31/2013 with ischemic right foot as well as left fifth digit ischemic changes. Maintained on broad-spectrum antibiotics. Patient with bouts of confusion as well as transient blindness  with cranial CT scan negative and fall neurology services. Echocardiogram with ejection fraction of 35% diffuse hypokinesis. Vascular surgery followup limb was not felt to be salvageable and underwent right AKA 01/12/2014 per Dr. Hart Rochester. Postoperative pain management. Subcutaneous Lovenox for DVT prophylaxis. Hemodialysis ongoing as directed. Palliative care involved for goals of care was complicated family dynamics. Physical occupational therapy evaluations pending. M.D. has requested physical medicine rehabilitation consult.  Pt denies CP or SOB, episode of AFib /RVR this am No PT or OT yet Review of Systems  Respiratory: Positive for shortness of breath.   Cardiovascular: Positive for leg swelling.  Neurological: Positive for weakness.  Psychiatric/Behavioral: The patient has insomnia.   All other systems reviewed and are negative.   Past Medical History  Diagnosis Date  . CAD (coronary artery disease)   . Hypertension   . Diabetes mellitus     type 2 insulin dependent  . Hyperlipidemia   . CHF (congestive heart failure)   . Cellulitis     left foot  . Ulcer     diabetic ulcer on left foot  . DVT (deep venous thrombosis)     peroneal vein  . Peripheral vascular disease   . Chronic kidney disease   . Anemia   . Shortness of breath     with fluid  . Insomnia    Past Surgical History  Procedure Laterality Date  . Tubal ligation    . Pr vein bypass  graft,aorto-fem-pop  06-26-11    1. PATENT LEFT FEMORAL-POPLITEAL BYPASS GRAFT W/NO EVEIDENCE OF STENOSIS. 2.VELOCITIES OF GREATER THAN 200 CM/'s NOTED ON PREVIOUS EXAM 10/03/11 WERE NOT ADEQUATELY VISULAIZED DURING THIS EXAM  . Coronary artery bypass graft  04/25/08    Dr. Tyrone Sage, CABG x 5   . Nm myoview ltd  01/13/13    LEXISCAN; LV WALL MOTION: LVEF 44%, INFERIOR AKINESIS, ANTEROAPICAL HYPOKINESIS  . Cardiac catheterization  04/22/08    SEVERE 3-VESSEL DISEASE CAD., CABG X 5 04/25/08 WITH DR. CVELFYBO  . Av fistula placement Left 09/07/2013    Procedure: ARTERIOVENOUS (AV) FISTULA CREATION;  Surgeon: Sherren Kerns, MD;  Location: [facility] OR;  Service: Vascular;  Laterality: Left;  . Ligation arteriovenous gortex graft Left 01/09/2014    Procedure: LIGATION ARTERIOVENOUS GORTEX GRAFT;  Surgeon: Sherren Kerns, MD;  Location: [facility] OR;  Service: Vascular;  Laterality: Left;  . Insertion of dialysis catheter Right 01/09/2014    Procedure: INSERTION OF DIALYSIS CATHETER;  Surgeon: Sherren Kerns, MD;  Location: [facility] OR;  Service: Vascular;  Laterality: Right;  Insertion of diatek to right internal Jugular artery.   Family History  Problem Relation Age of Onset  . Cancer Mother     Kidney  . Kidney disease Mother   . Cancer Father     skin   Social History:  reports that she quit smoking about 17 years ago. Her smoking use included Cigarettes. She has a 40 pack-year smoking history. She has quit using smokeless tobacco. Her smokeless tobacco use included Snuff. She reports that she  does not drink alcohol or use illicit drugs. Allergies:  Allergies  Allergen Reactions  . Ivp Dye [Iodinated Diagnostic Agents] Hives and Rash  . Aspirin Hives   Medications Prior to Admission  Medication Sig Dispense Refill  . acetaminophen (TYLENOL) 500 MG tablet Take 1,000-1,500 mg by mouth every 6 (six) hours as needed for mild pain or headache.      [name]. amLODipine (NORVASC) 5 MG tablet Take 10 mg by mouth daily.        [name]. atorvastatin (LIPITOR) 40 MG tablet Take 40 mg by mouth daily.       . calcitRIOL (ROCALTROL) 0.25 MCG capsule Take 0.5 mcg by mouth daily.      . carvedilol (COREG) 12.5 MG tablet Take 12.5 mg by mouth 2 (two) times daily with a meal.       . cephALEXin (KEFLEX) 500 MG capsule Take 500 mg by mouth 4 (four) times daily. 10 day course filled 12/30/13      . ferrous sulfate 325 (65 FE) MG tablet Take 325 mg by mouth 2 (two) times daily.       . folic acid (FOLVITE) 1 MG tablet Take 1 mg by mouth daily.       . furosemide (LASIX) 40 MG tablet Take 2 tablets (80 mg total) by mouth 2 (two) times daily.  120 tablet  1  . insulin aspart (NOVOLOG) 100 UNIT/ML injection Inject 20-26 Units into the skin 3 (three) times daily before meals. Take 20 units at breakfast and lunch. Take 26 units at dinner Sliding scale      . isosorbide mononitrate (IMDUR) 30 MG 24 hr tablet Take 15 mg by mouth daily.      . nitroGLYCERIN (NITROSTAT) 0.4 MG SL tablet Place 0.4 mg under the tongue every 5 (five) minutes as needed for chest pain.      [name]. omega-3 acid ethyl esters (LOVAZA) 1 G capsule Take 1 g by mouth 4 (four) times daily.       [name]. omeprazole (PRILOSEC) 20 MG capsule Take 20 mg by mouth daily.       [name]. oxyCODONE-acetaminophen (PERCOCET) 10-325 MG per tablet Take 1 tablet by mouth 3 (three) times daily as needed for pain.      [name]. warfarin (COUMADIN) 4 MG tablet Take 4-6 mg by mouth daily at 6 PM. Take 1 tablet (4mg ) by mouth on Monday, Wednesday, Friday, and Saturday.  Take 1.5 tablet (6mg ) by mouth on Tuesday, Thursday, and saturday      . clindamycin (CLEOCIN) 300 MG capsule Take 300 mg by mouth 3 (three) times daily. 7 day course filled 12/23/13        Home: Home Living Family/patient expects to be discharged to:: Private residence Living Arrangements: Alone Home Access: Level entry Home Layout: One level Additional Comments: pt states "I never had to use a walker or get any help, I don't understand why  it's different now"  Functional History: Prior Function Level of Independence: Independent Functional Status:  Mobility: Bed Mobility Overal bed mobility: Needs Assistance Bed Mobility: Supine to Sit Supine to sit: Supervision General bed mobility comments: cues for safety, increased time Transfers Overall transfer level: Needs assistance Equipment used: Rolling walker (2 wheeled) Transfers: Sit to/from [employer] CorporationStand;Stand Pivot Transfers Sit to Stand: Min assist Stand pivot transfers: Mod assist;Min assist General transfer comment: cues for hand placement and manual facilitation for wt shifts, improves with tactile cues for upright posture      ADL:  Cognition: Cognition Overall Cognitive Status:  (delayed processing) Orientation Level: Oriented to person;Disoriented to situation;Disoriented to time;Disoriented to place Cognition Arousal/Alertness: Awake/alert Behavior During Therapy: WFL for tasks assessed/performed Overall Cognitive Status:  (delayed processing)  Blood pressure 155/80, pulse 90, temperature 97.5 F (36.4 C), temperature source Oral, resp. rate 18, height 5\' 3"  (1.6 m), weight 145 lb 8.1 oz (66 kg), SpO2 99.00%. Physical Exam  Constitutional:  frail elderly African American female  Eyes:  Pupils very slow to the right  Neck: Normal range of motion. Neck supple. No thyromegaly present.  Cardiovascular: Normal rate and regular rhythm.   Respiratory: Effort normal and breath sounds normal. No respiratory distress.  GI: Soft. Bowel sounds are normal. She exhibits no distension.  Neurological:  Patient is lethargic but arousable. Patient did blink to threat and would track my finger although inconsistent. She did state her name and age. She would not name familiar objects. She did need some prompting to participate with exam. She is a poor medical historian.  Skin:  Right AKA site is dressed. Noted ischemic changes to left foot.   oriented to [facility], April  3 Motor strength is 4/5 in the right deltoid, bicep, tricep, grip 4/5 in the left deltoid 3 minus in the left bicep 3 minus in the grip Cyanosis of the left fifth digit 82 total fossa ecchymosis around incision on the left Right AKA does not move B. right hip Left lower extremity 4 minus at the hip flexor knee extensor ankle dorsiflexor plantar flexor Sensation intact left foot to light touch  Results for orders placed during the hospital encounter of 12/31/13 (from the past 24 hour(s))  GLUCOSE, CAPILLARY     Status: Abnormal   Collection Time    01/11/14  8:57 PM      Result Value Ref Range   Glucose-Capillary 152 (*) 70 - 99 mg/dL  RENAL FUNCTION PANEL     Status: Abnormal   Collection Time    01/12/14  4:00 AM      Result Value Ref Range   Sodium 139  137 - 147 mEq/L   Potassium 3.9  3.7 - 5.3 mEq/L   Chloride 97  96 - 112 mEq/L   CO2 24  19 - 32 mEq/L   Glucose, Bld 187 (*) 70 - 99 mg/dL   BUN 18  6 - 23 mg/dL   Creatinine, Ser 1.61 (*) 0.50 - 1.10 mg/dL   Calcium 8.9  8.4 - 09.6 mg/dL   Phosphorus 2.3  2.3 - 4.6 mg/dL   Albumin 1.8 (*) 3.5 - 5.2 g/dL   GFR calc non Af Amer 18 (*) >90 mL/min   GFR calc Af Amer 20 (*) >90 mL/min  CBC     Status: Abnormal   Collection Time    01/12/14  4:00 AM      Result Value Ref Range   WBC 15.5 (*) 4.0 - 10.5 K/uL   RBC 3.70 (*) 3.87 - 5.11 MIL/uL   Hemoglobin 10.0 (*) 12.0 - 15.0 g/dL   HCT 04.5 (*) 40.9 - 81.1 %   MCV 84.3  78.0 - 100.0 fL   MCH 27.0  26.0 - 34.0 pg   MCHC 32.1  30.0 - 36.0 g/dL   RDW 91.4 (*) 78.2 - 95.6 %   Platelets 203  150 - 400 K/uL  GLUCOSE, CAPILLARY     Status: Abnormal   Collection Time    01/12/14  8:05 AM      Result  Value Ref Range   Glucose-Capillary 170 (*) 70 - 99 mg/dL  GLUCOSE, CAPILLARY     Status: Abnormal   Collection Time    01/12/14 10:11 AM      Result Value Ref Range   Glucose-Capillary 173 (*) 70 - 99 mg/dL   Comment 1 Notify RN     No results  found.  Assessment/Plan: Diagnosis: Right AKA secondary to peripheral arterial disease in a patient with end stage renal disease 1. Does the need for close, 24 hr/day medical supervision in concert with the patient's rehab needs make it unreasonable for this patient to be served in a less intensive setting? No 2. Co-Morbidities requiring supervision/potential complications: A. fib, diabetes 3. Due to bladder management, bowel management, safety, skin/wound care, disease management, medication administration, pain management and patient education, does the patient require 24 hr/day rehab nursing? Potentially 4. Does the patient require coordinated care of a physician, rehab nurse, PT (0.5-1 hrs/day, 5 days/week) and OT (0.5-1 hrs/day, 5 days/week) to address physical and functional deficits in the context of the above medical diagnosis(es)? Potentially Addressing deficits in the following areas: balance, endurance, locomotion, strength, transferring, bowel/bladder control, bathing, dressing and toileting 5. Can the patient actively participate in an intensive therapy program of at least 3 hrs of therapy per day at least 5 days per week? No 6. The potential for patient to make measurable gains while on inpatient rehab is poor 7. Anticipated functional outcomes upon discharge from inpatient rehab are n/a  with PT, n/a with OT, n/a with SLP. 8. Estimated rehab length of stay to reach the above functional goals is: NA 9. Does the patient have adequate social supports to accommodate these discharge functional goals? N/A 10. Anticipated D/C setting: Other 11. Anticipated post D/C treatments: HH therapy 12. Overall Rehab/Functional Prognosis: fair  RECOMMENDATIONS: This patient's condition is appropriate for continued rehabilitative care in the following setting: SNF Patient has agreed to participate in recommended program. Potentially Note that insurance prior authorization may be required for  reimbursement for recommended care.  Comment: poor prosthetic candidate secondary to AKA with ESRD    01/12/2014

## 2014-01-12 NOTE — Anesthesia Preprocedure Evaluation (Addendum)
Anesthesia Evaluation  Patient identified by MRN, date of birth, ID band Patient awake    Reviewed: Allergy & Precautions, H&P , NPO status , Patient's Chart, lab work & pertinent test results  History of Anesthesia Complications Negative for: history of anesthetic complications  Airway Mallampati: II TM Distance: >3 FB Neck ROM: Full    Dental  (+) Edentulous Upper, Edentulous Lower, Dental Advisory Given   Pulmonary shortness of breath and with exertion, sleep apnea and Continuous Positive Airway Pressure Ventilation , COPD COPD inhaler, former smoker,          Cardiovascular hypertension, Pt. on medications + CAD, + Peripheral Vascular Disease and +CHF     Neuro/Psych    GI/Hepatic negative GI ROS, Neg liver ROS,   Endo/Other  diabetes, Well Controlled, Type 2  Renal/GU Dialysis and ESRFRenal disease     Musculoskeletal   Abdominal   Peds  Hematology  (+) anemia ,   Anesthesia Other Findings   Reproductive/Obstetrics                          Anesthesia Physical Anesthesia Plan  ASA: III  Anesthesia Plan: General   Post-op Pain Management:    Induction:   Airway Management Planned: LMA  Additional Equipment:   Intra-op Plan:   Post-operative Plan: Extubation in OR  Informed Consent: I have reviewed the patients History and Physical, chart, labs and discussed the procedure including the risks, benefits and alternatives for the proposed anesthesia with the patient or authorized representative who has indicated his/her understanding and acceptance.   Dental advisory given  Plan Discussed with:   Anesthesia Plan Comments:         Anesthesia Quick Evaluation

## 2014-01-12 NOTE — Progress Notes (Signed)
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  Dosing Weight: 66 kg  Afebrile  96.9 F (36.1 C) (Axillary)   Component Value Date   WBC 15.5* 01/12/2014    Labs:  Recent Labs  01/10/14 0432 01/11/14 0500 01/12/14 0400  WBC 17.2* 16.2* 15.5*  HGB 9.8* 10.0* 10.0*  PLT 231 201 203  CREATININE 3.78* 4.46* 2.58*   ESRD on HD Mon-Wed-Fri  Microbiology: Recent Results (from the past 720 hour(s))  SURGICAL PCR SCREEN     Status: None   Collection Time    01/09/14 12:18 AM      Result Value Ref Range Status   MRSA, PCR NEGATIVE  NEGATIVE Final   Staphylococcus aureus NEGATIVE  NEGATIVE Final   Comment:            The Xpert SA Assay (FDA     approved for NASAL specimens     in patients over 79 years of age),     is one component of     a comprehensive surveillance     program.  Test performance has     been validated by [employer] for patients greater     than or equal to 20 year old.     It is not intended     to diagnose infection nor to     guide or monitor treatment.    Current Medication[s] Include:  Scheduled:  Scheduled:  . amLODipine  10 mg Oral Daily  . cefUROXime (ZINACEF)  IV  1.5 g Intravenous On Call to OR  . darbepoetin (ARANESP) injection - DIALYSIS  60 mcg Intravenous Q Tue-HD  . [START ON 01/13/2014] docusate sodium  100 mg Oral Daily  . doxercalciferol  2 mcg Intravenous Q M,W,F-HD  . [START ON 01/13/2014] enoxaparin (LOVENOX) injection  30 mg Subcutaneous Q24H  . feeding supplement (NEPRO CARB STEADY)  237 mL Oral TID WC  . HYDROmorphone      . insulin aspart  0-15 Units Subcutaneous TID WC  . labetalol  100 mg Oral BID  . lanthanum  500 mg Oral TID WC  . oxyCODONE-acetaminophen      . pantoprazole  40 mg Oral Daily  . piperacillin-tazobactam (ZOSYN)  IV  2.25 g Intravenous 3 times per day  . polyethylene glycol  17 g Oral Daily  . sodium chloride  3 mL  Intravenous Q12H    Infusion[s]: Infusions:  . dextrose 5 % and 0.45% NaCl      Antibiotic[s]: Anti-infectives   Start     Dose/Rate Route Frequency Ordered Stop   01/12/14 1200  cefUROXime (ZINACEF) 1.5 g in dextrose 5 % 50 mL IVPB  Status:  Discontinued     1.5 g 100 mL/hr over 30 Minutes Intravenous Every 12 hours 01/12/14 1150 01/12/14 1208   01/12/14 0600  cefUROXime (ZINACEF) 1.5 g in dextrose 5 % 50 mL IVPB     1.5 g 100 mL/hr over 30 Minutes Intravenous On call to O.R. 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    Day # 12 of Zosyn initially for cellulitis, now to be continued post-op.  Patient on HD, usual schedule q MWF  Goal of Therapy:   Zosyn selected for infection site, likely causative organisms, any cultures and adjusted for renal function.   Monitor WBC, fever curve, Dialysis schedule, any cultures/sensitivities, and clinical progression.  Plan:  1. Continue Zosyn 2.25 gm IV q 8 hours as previously ordered. 2. Follow up Length of Therapy post-op.  Velda ShellStramoski, Megahn Killings J   01/12/2014,12:53 PM

## 2014-01-12 NOTE — Progress Notes (Signed)
Chaplain gave support to pt by a caring presence-compassionate conversation and empathic listening.    01/12/14 1600  Clinical Encounter Type  Visited With Patient  Visit Type Spiritual support   Rulon Abide, chaplain pager [phone]

## 2014-01-13 ENCOUNTER — Encounter (HOSPITAL_COMMUNITY): Payer: Self-pay | Admitting: Vascular Surgery

## 2014-01-13 DIAGNOSIS — Z992 Dependence on renal dialysis: Secondary | ICD-10-CM

## 2014-01-13 DIAGNOSIS — M79609 Pain in unspecified limb: Secondary | ICD-10-CM

## 2014-01-13 DIAGNOSIS — N186 End stage renal disease: Secondary | ICD-10-CM

## 2014-01-13 DIAGNOSIS — I739 Peripheral vascular disease, unspecified: Secondary | ICD-10-CM

## 2014-01-13 DIAGNOSIS — S88119A Complete traumatic amputation at level between knee and ankle, unspecified lower leg, initial encounter: Secondary | ICD-10-CM

## 2014-01-13 DIAGNOSIS — L98499 Non-pressure chronic ulcer of skin of other sites with unspecified severity: Secondary | ICD-10-CM

## 2014-01-13 LAB — GLUCOSE, CAPILLARY
GLUCOSE-CAPILLARY: 133 mg/dL — AB (ref 70–99)
GLUCOSE-CAPILLARY: 191 mg/dL — AB (ref 70–99)
GLUCOSE-CAPILLARY: 203 mg/dL — AB (ref 70–99)
Glucose-Capillary: 145 mg/dL — ABNORMAL HIGH (ref 70–99)

## 2014-01-13 LAB — CBC
HEMATOCRIT: 28.7 % — AB (ref 36.0–46.0)
Hemoglobin: 8.8 g/dL — ABNORMAL LOW (ref 12.0–15.0)
MCH: 26.5 pg (ref 26.0–34.0)
MCHC: 30.7 g/dL (ref 30.0–36.0)
MCV: 86.4 fL (ref 78.0–100.0)
Platelets: 214 10*3/uL (ref 150–400)
RBC: 3.32 MIL/uL — ABNORMAL LOW (ref 3.87–5.11)
RDW: 18.2 % — AB (ref 11.5–15.5)
WBC: 14.7 10*3/uL — ABNORMAL HIGH (ref 4.0–10.5)

## 2014-01-13 LAB — RENAL FUNCTION PANEL
Albumin: 1.7 g/dL — ABNORMAL LOW (ref 3.5–5.2)
BUN: 29 mg/dL — ABNORMAL HIGH (ref 6–23)
CHLORIDE: 99 meq/L (ref 96–112)
CO2: 25 meq/L (ref 19–32)
Calcium: 9 mg/dL (ref 8.4–10.5)
Creatinine, Ser: 3.6 mg/dL — ABNORMAL HIGH (ref 0.50–1.10)
GFR, EST AFRICAN AMERICAN: 14 mL/min — AB (ref >90)
GFR, EST NON AFRICAN AMERICAN: 12 mL/min — AB (ref >90)
Glucose, Bld: 211 mg/dL — ABNORMAL HIGH (ref 70–99)
Phosphorus: 3.8 mg/dL (ref 2.3–4.6)
Potassium: 4.1 mEq/L (ref 3.7–5.3)
SODIUM: 141 meq/L (ref 137–147)

## 2014-01-13 MED ORDER — LABETALOL HCL 200 MG PO TABS
200.0000 mg | ORAL_TABLET | Freq: Two times a day (BID) | ORAL | Status: DC
  Administered 2014-01-14 (×2): 200 mg via ORAL
  Filled 2014-01-13 (×5): qty 1

## 2014-01-13 MED ORDER — HYDROMORPHONE HCL PF 1 MG/ML IJ SOLN
INTRAMUSCULAR | Status: AC
  Administered 2014-01-13: 1 mg via INTRAVENOUS
  Filled 2014-01-13: qty 1

## 2014-01-13 NOTE — Significant Event (Signed)
Rapid Response Event Note Called by primary RN to see pt for episode of decreased responsiveness Overview: Time Called: 2143 Arrival Time: 2145 Event Type: Neurologic  Initial Focused Assessment: On assessment pt is blinking eyes but nonverbal.  Pt does not respond to a sternal rub.  Pt has no focal deficits and when her hand is raised above her head does NOT allow it to hit her in the face but allows a slow and controlled decent .  BP 159/99, HR 80, sats, CBG 145.  Pt was turned & repositioned and opened her eyes and clearly focused on me but made no verbal response.  Will cont. To monitor  Interventions:   Event Summary: Name of Physician Notified: Benedetto Coons, NP at 2200  Name of Consulting Physician Notified: Roseanne Reno, MD at 0630  Outcome: Stayed in room and stabalized  Event End Time: 0655  Olin Hauser [facility]

## 2014-01-13 NOTE — Procedures (Signed)
I have personally attended this patient's dialysis session.  No heparin, TDC 400. BP 165 systolic 2-3 liter goal    [name]

## 2014-01-13 NOTE — Progress Notes (Signed)
Clinical Social Work Department BRIEF PSYCHOSOCIAL ASSESSMENT 01/13/2014  Patient:  [name], [name]     Account Number:  [id number]     Admit date:  12/31/2013  Clinical Social Worker:  Carren Rang  Date/Time:  01/13/2014 02:34 PM  Referred by:  Physician  Date Referred:  01/13/2014 Referred for  SNF Placement   Other Referral:   Interview type:  Family Other interview type:   CSW spoke to patient's daughter over the phone    PSYCHOSOCIAL DATA Living Status:  FAMILY Admitted from facility:   Level of care:   Primary support name:  [name] Primary support relationship to patient:  CHILD, ADULT Degree of support available:   Good    CURRENT CONCERNS Current Concerns  Post-Acute Placement   Other Concerns:    SOCIAL WORK ASSESSMENT / PLAN Clinical Social Worker received referral for SNF placement at d/c. CSW reviewed chart and noticed patient has times of confusion. Per RN, Child psychotherapist can call patient's daughter. CSW called patient's daughter and introduced self and explained reason for call. CSW explained SNF process to patient's daughter. Daughter states that she is already familiar with the process and is a CNA and would like patient to get rehab at [facility] [place] or [facility] and [address]. Patient's daughter stated she wants the best for her mom and has already spoken to patient and states patient wants rehab as well.  CSW will complete FL2 for MD's signature and will update patient and family when bed offers are received.   Assessment/plan status:  Psychosocial Support/Ongoing Assessment of Needs Other assessment/ plan:   Information/referral to community resources:   snf information    PATIENT'S/FAMILY'S RESPONSE TO PLAN OF CARE: Patient's daughter states she has already been looking at [facility] and likes [facility] [place] the best because of location.        [name], MSW, [name] [phone]

## 2014-01-13 NOTE — Progress Notes (Signed)
Clinical Social Work Department CLINICAL SOCIAL WORK PLACEMENT NOTE 01/13/2014  Patient:  [name], [name]  Account Number:  [id number] Admit date:  12/31/2013  Clinical Social Worker:  Carren Rang  Date/time:  01/13/2014 02:41 PM  Clinical Social Work is seeking post-discharge placement for this patient at the following level of care:   SKILLED NURSING   (*CSW will update this form in Epic as items are completed)   01/13/2014  Patient/family provided with [facility] Department of Clinical Social Work's list of facilities offering this level of care within the geographic area requested by the patient (or if unable, by the patient's family).  01/13/2014  Patient/family informed of their freedom to choose among providers that offer the needed level of care, that participate in Medicare, Medicaid or managed care program needed by the patient, have an available bed and are willing to accept the patient.  01/13/2014  Patient/family informed of [facility]' ownership interest in [facility], as well as of the fact that they are under no obligation to receive care at this facility.  PASARR submitted to EDS on  PASARR number received from EDS on   FL2 transmitted to all facilities in geographic area requested by pt/family on  01/13/2014 FL2 transmitted to all facilities within larger geographic area on   Patient informed that his/her managed care company has contracts with or will negotiate with  certain facilities, including the following:     Patient/family informed of bed offers received:   Patient chooses bed at  Physician recommends and patient chooses bed at    Patient to be transferred to  on   Patient to be transferred to facility by   The following physician request were entered in Epic:   Additional Comments: Patient has existing PASRR number  [name], MSW, [employer] [phone]

## 2014-01-13 NOTE — Progress Notes (Signed)
Patient is currently active with [facility] Care Management for chronic disease management services.  Patient has been engaged by a [employer].  Patient lives alone and has struggled to self care.  In light of her current condition her [facility] team recommends a higher level of care.  Made Inpatient Case Manager aware that [facility] Care Management following. Of note, [facility] Care Management services does not replace or interfere with any services that are arranged by inpatient case management or social work.  For additional questions or referrals please contact Anibal Henderson BSN RN [facility] Liaison at [phone].

## 2014-01-13 NOTE — Progress Notes (Signed)
RN walked into patient's room to give her b/p medications but patient would not wake up. RN called patient's name and rubbed her shoulders many times but patient would not respond. Rn called charge nurse into room to assess patient but patient still would not wake up. Patient was given a sterum rub, which she responded to very poorly. Patient's vitals were taken- b/p 159/99, temp 99, cbg 145. Rapid was called. Patient was assessed by April, RN. April lifted patient's arms up in the air- when the arm fell, patient moved it so it would not hit her face. All three RN's realized that patient was purposely ignoring staff. Patient finally opened her eyes but still would not speak to staff. She then closed them and proceeded to be still. After cleaning patient- she begun lifting her arms up and putting it down repeatedly. NP, Claiborne Billings has been notified and patient will continue to be monitored.

## 2014-01-13 NOTE — Progress Notes (Signed)
OT Cancellation Note  Patient Details Name: [name] MRN: [id number] DOB: Sep 04, 1941   Cancelled Treatment:    Reason Eval/Treat Not Completed: Patient at procedure or test/ unavailable. Pt in dialysis. Will attempt eval 01/14/14.  [name] [phone] 01/13/2014, 1:30 PM

## 2014-01-13 NOTE — Progress Notes (Signed)
PT Cancellation Note  Patient Details Name: [name] MRN: [id number] DOB: 01-22-1941   Cancelled Treatment:    Reason Eval/Treat Not Completed: Patient at procedure or test/unavailable: pt in dialysis.   [name] 01/13/2014, 1:22 PM Pager [phone]

## 2014-01-13 NOTE — Progress Notes (Signed)
Patient ID: [name], female   DOB: 09/24/1941, 73 y.o.   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 Oriented today In dialysis   Objective:   BP 165/73  Pulse 92  Temp(Src) 98.4 F (36.9 C) (Oral)  Resp 18  Ht 5\' 3"  (1.6 m)  Wt 64.7 kg (142 lb 10.2 oz)  BMI 25.27 kg/m2  SpO2 99%  Intake/Output Summary (Last 24 hours) at 01/13/14 1153 Last data filed at 01/13/14 0800  Gross per 24 hour  Intake    220 ml  Output      0 ml  Net    220 ml   Weight change: -6 kg (-13 lb 3.6 oz)  Physical Exam: Gen: NAD Oriented X3 Miild periorbital edema CVS: Pulse regular in rate and rhythm,  S1 and S2 with an ejection systolic murmur Resp: Clear to auscultation bilaterally, no rales/rhonchi Abd: Soft, flat, nontender Ext: right AKA dressing and hemovac in place. Positive edema trace LLE Left upper arm AVF ligated- fifth digit dusky/tip dry gangrene    Recent Labs Lab 01/07/14 0320 01/08/14 0353 01/09/14 0320 01/10/14 0432 01/11/14 0500 01/12/14 0400 01/13/14 0500  NA 136* 138 141 143 142 139 141  K 4.4 3.8 4.2 3.7 3.6* 3.9 4.1  CL 96 94* 96 98 99 97 99  CO2 19 20 22 25 25 24 25   GLUCOSE 82 77 92 112* 157* 187* 211*  BUN 84* 40* 20 30* 38* 18 29*  CREATININE 6.05* 3.97* 2.80* 3.78* 4.46* 2.58* 3.60*  CALCIUM 8.5 8.9 9.2 9.0 8.8 8.9 9.0  PHOS 5.8* 3.9 4.0 4.7* 4.5 2.3 3.8    Recent Labs Lab 01/10/14 0432 01/11/14 0500 01/12/14 0400 01/13/14 1038  WBC 17.2* 16.2* 15.5* 14.7*  HGB 9.8* 10.0* 10.0* 8.8*  HCT 30.4* 31.3* 31.2* 28.7*  MCV 83.7 85.1 84.3 86.4  PLT 231 201 203 214   Medications:    . amLODipine  10 mg Oral Daily  . darbepoetin (ARANESP) injection - DIALYSIS  60 mcg Intravenous Q Tue-HD  . docusate sodium  100 mg Oral Daily  . doxercalciferol  2 mcg Intravenous Q M,W,F-HD  . feeding supplement (NEPRO CARB STEADY)  237 mL Oral TID WC  . insulin aspart  0-15 Units Subcutaneous TID WC  .  labetalol  100 mg Oral BID  . lanthanum  500 mg Oral TID WC  . pantoprazole  40 mg Oral Daily  . piperacillin-tazobactam (ZOSYN)  IV  2.25 g Intravenous 3 times per day  . polyethylene glycol  17 g Oral Daily  . sodium chloride  3 mL Intravenous Q12H    Assessment/ Plan:    Chronic kidney disease stage V with contrast exposure:  Now ESRD Initiated dialysis therapy this admit.   On TTS schedule   [facility] 2nd shift when discharge time rolls around  Had AVF which unfortunately needed to be ligated 3/28 secondary to steal- [facility] Not appropriate to rush into another access procedure right now.   No heparin HD today since post op, then tight Establishing EDW post amputation  Right foot ischemia:  No good options for revasc.   S/p AKA by Dr. Hart RochesterLawson . Anemia:  Hemoglobin down post op Status post intravenous iron therapy for low Tsat .  On aranesp as well (60 QWed - changed to QTues with HD)  Hypertension:  Norvasc;  Aggravated  by pain/fluids.  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

## 2014-01-13 NOTE — Progress Notes (Signed)
Left lower extremity venous duplex:  No evidence of DVT, superficial thrombosis, or Baker's cyst.

## 2014-01-13 NOTE — Progress Notes (Addendum)
[facility]  Subjective  - Patient very alert this am. Requesting pain medication, ice and to have her back wiped off.     Objective 164/67 83 98.7 F (37.1 C) (Oral) 18 99%  Intake/Output Summary (Last 24 hours) at 01/13/14 0803 Last data filed at 01/13/14 0800  Gross per 24 hour  Intake    320 ml  Output    200 ml  Net    120 ml    Right AKA dressing C/D/I Drain in place 200 cc total blood output  Assessment/Planning: POD #1AMPUTATION ABOVE KNEE- RIGHT Plan to change dressing tomorrow, pending drain output will d/c drain if 30 cc or less in 8 hour shift.    Clinton Gallant [facility] 01/13/2014 8:03 AM --  Laboratory Lab Results:  Recent Labs  01/11/14 0500 01/12/14 0400  WBC 16.2* 15.5*  HGB 10.0* 10.0*  HCT 31.3* 31.2*  PLT 201 203   BMET  Recent Labs  01/12/14 0400 01/13/14 0500  NA 139 141  K 3.9 4.1  CL 97 99  CO2 24 25  GLUCOSE 187* 211*  BUN 18 29*  CREATININE 2.58* 3.60*  CALCIUM 8.9 9.0    COAG Lab Results  Component Value Date   INR 1.30 01/04/2014   INR 1.52* 01/03/2014   INR 1.55* 01/02/2014   No results found for this basename: PTT  Agree with above Dressing dry DC drain in am

## 2014-01-13 NOTE — Progress Notes (Signed)
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 Hart Rochester  Left hand Steal/digital ischemia-  -due to ischemic steal,  -s/p left brachiocephalic fistula ligation on 3/28.  Cellulitis of the right foot -  -No longer active issue status post right AKA -Had been on Zosyn since 01/01/2014  -Discontinue antibiotics postop R-AKA  Left arm numbness/ Sensory disturbance -  -01/06/2014 -MRI brain negative for acute events but chronic ischemic changes  Confusion  -?previously due to uremia  - neurology consulted, appreciate input. Patient's exam inconsistent per neurology notes.  -unclear baseline Dysrhythmia -EKG -suspect underlying Afib Chronic systolic congestive heart failure  -2 D ECHO 01/02/14 --> EF 30 - 35 %  - undergoing HD per nephrology  CKD (chronic kidney disease), stage V, now ESRD  - on HD, first 3 sessions via fistula, now via PC after fistula ligation.  -appreciate renal followup  COPD (chronic obstructive pulmonary disease)  - clinically compensated,  Essential hypertension  -continue Amlodipine  -Pain is contributing to elevated blood pressure Anemia of chronic disease/CKD  - no signs of active bleeding but drop in hg since admission  - aranesp started per nephrology team  -Small drop in hemoglobin postoperatively-Continue monitor  DM type 2, uncontrolled, with renal complications  - reasonable inpatient control  - A1C (3/21) 8.1  - continue SSI while inpatient  Leukocytosis - secondary to right hallux ischemia and cellulitis  HLD - continue statin as per home medical regimen  Moderate malnutrition - albumin ~2, secondary to progressive nature of the complex medical conditions outlined above  - Patient eating very little, states that food gives her generalized pain  throughout her entire body.  -Continue Nepro shakes 3 times a day  Goals of care - patient with progressive decline in the past 4-5 months, initially her wishes were DNR/DNI and she favored no dialysis last November when she wanted to think about it.  -now unable to make decisions as she has intermittent confusion and her daughter is making her medical decisions, and states that her mother wishes dialysis and to be a full code.  -Appreciate palliative care consult.  Dispo--daughter now desires SNF, updated on phone    Family Communication:   Daughter updated on phone Disposition Plan:   SNF      Procedures/Studies: Dg Chest 2 View  01/01/2014   CLINICAL DATA:  Short of breath upon exertion  EXAM: CHEST  2 VIEW  COMPARISON:  DG CHEST 2 VIEW dated 12/22/2013  FINDINGS: Sternotomy wires overlie enlarged cardiac silhouette. There is mild increase in fine airspace disease in the left and right lung. Bilateral small effusions are present. The right effusion is larger but not changed from prior. No pneumothorax.  IMPRESSION: Congestive heart failure pattern which is mildly worsened from prior. .   Electronically Signed   By: Genevive Bi M.D.   On: 01/01/2014 18:11   Dg Chest 2 View  12/22/2013   CLINICAL DATA Fatigue. Shortness of breath. Current history of diabetes and hypertension. Prior CABG.  EXAM CHEST  2 VIEW  COMPARISON DG CHEST 2V dated 11/24/2013; DG CHEST 2V dated 08/27/2013; DG CHEST 2 VIEW dated 08/17/2013; CT CHEST W/O CM dated 02/17/2013; DG CHEST 2V dated 07/28/2012  FINDINGS Cardiac silhouette moderately to markedly enlarged but stable. Thoracic aorta atherosclerotic, unchanged. Hilar and mediastinal contours otherwise unremarkable. Diffuse interstitial and airspace  pulmonary edema, superimposed upon COPD/emphysema. Nodular scarring in the right lung apex, unchanged. Moderately large right pleural effusion and associated consolidation in the right lower lobe. No visible left pleural  effusion. Degenerative changes involving the thoracic spine.  IMPRESSION Mild CHF superimposed upon COPD/emphysema. Moderate size right pleural effusion and associated passive atelectasis in the right lower lobe.  SIGNATURE  Electronically Signed   By: Hulan Saashomas  Lawrence M.D.   On: 12/22/2013 19:46   Ct Head Wo Contrast  01/05/2014   CLINICAL DATA:  Decreased mental status.  Left hand numbness.  EXAM: CT HEAD WITHOUT CONTRAST  TECHNIQUE: Contiguous axial images were obtained from the base of the skull through the vertex without intravenous contrast.  COMPARISON:  MR HEAD W/O CM dated 01/03/2014; DG ANG/EXT/UNI/OR*L* dated 06/26/2011  FINDINGS: There is no evidence of acute intracranial hemorrhage, mass lesion, brain edema or extra-axial fluid collection. The ventricles and subarachnoid spaces are appropriately sized for age. There is no CT evidence of acute cortical infarction. Mild periventricular white matter disease appears stable. There diffuse intracranial vascular calcifications.  The visualized paranasal sinuses, mastoid air cells and middle ears are clear. The calvarium is intact.  IMPRESSION: Stable appearance of the brain.  No acute intracranial findings.   Electronically Signed   By: Roxy HorsemanBill  Veazey M.D.   On: 01/05/2014 12:58   Mr Brain Wo Contrast  01/06/2014   CLINICAL DATA:  Evaluate for stroke. Decreased mental status. Left hand numbness.  EXAM: MRI HEAD WITHOUT CONTRAST  TECHNIQUE: Multiplanar, multiecho pulse sequences of the brain and surrounding structures were obtained without intravenous contrast.  COMPARISON:  CT HEAD W/O CM dated 01/05/2014; MR HEAD W/O CM dated 01/03/2014  FINDINGS: No evidence for acute infarction, hemorrhage, mass lesion, hydrocephalus, or extra-axial fluid. Unchanged atrophy with small vessel disease.  No changes suggestive of posterior reversible encephalopathy syndrome.  Post infusion, no abnormal enhancement of the brain or meninges. Low T1 signal intensity bone marrow  due to renal disease. Mild pannus. No acute sinus, orbital, or mastoid disease.  IMPRESSION: No acute stroke or changes suggestive of hypertensive encephalopathy.  Atrophy and small vessel disease, stable.   Electronically Signed   By: Davonna BellingJohn  Curnes M.D.   On: 01/06/2014 21:17   Mr Brain Wo Contrast  01/03/2014   CLINICAL DATA:  Left arm numbness. Evaluate for stroke versus related to recent surgical procedure.  EXAM: MRI HEAD WITHOUT CONTRAST  TECHNIQUE: Multiplanar, multiecho pulse sequences of the brain and surrounding structures were obtained without intravenous contrast.  COMPARISON:  None.  FINDINGS: The patient was unable to remain motionless for the exam. Small or subtle lesions could be overlooked.  No acute stroke is evident. There is generalized atrophy with moderate chronic microvascular ischemic change. Remote right pontine and right basal ganglia lacunes are observed. Diffuse marrow signal abnormality consistent with chronic anemia/chronic renal disease. Cervical spondylosis. Left C1-C2 joint effusion, likely degenerative. Mild pannus.  IMPRESSION: Atrophy.  Chronic microvascular ischemic change.  Remote areas of ischemia without visible acute infarction. No underlying abnormality is seen which might contribute to left arm numbness.   Electronically Signed   By: Davonna BellingJohn  Curnes M.D.   On: 01/03/2014 17:57   Dg Chest Port 1 View  01/09/2014   CLINICAL DATA:  Central line placement  EXAM: PORTABLE CHEST - 1 VIEW  COMPARISON:  01/01/2014  FINDINGS: Dual-lumen central line, tip at the level of the upper right atrium. No evidence of pneumothorax.  Unchanged cardiopericardial enlargement. Status post CABG. Worsening diffuse interstitial  opacity. There are small layering pleural effusions, right more than left.  IMPRESSION: 1. Right IJ central line with the lowest catheter tip in the upper right atrium. No pneumothorax. 2. Worsening CHF.   Electronically Signed   By: Tiburcio Pea M.D.   On: 01/09/2014  02:43   Dg Foot Complete Right  12/22/2013   CLINICAL DATA Pain and swelling involving the medial right foot and the right great toe. Current history of diabetes.  EXAM RIGHT FOOT COMPLETE - 3+ VIEW  COMPARISON None.  FINDINGS No evidence of acute fracture or dislocation. Mild joint space narrowing involving the IP joints of multiple toes. Remaining joint spaces well preserved for age. Mild osseous demineralization. Very small enthesopathic spur at the insertion of the Achilles tendon on the posterior calcaneus. No other intrinsic osseous abnormalities. Extensive calcification involving the arteries of the foot.  IMPRESSION No acute osseous abnormality. 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  SIGNATURE  Electronically Signed   By: Hulan Saas M.D.   On: 12/22/2013 19:48   Dg Fluoro Guide Cv Line-no Report  01/09/2014   CLINICAL DATA: check for diatek placement   FLOURO GUIDE CV LINE  Fluoroscopy was utilized by the requesting physician.  No radiographic  interpretation.          Subjective:   Objective: Filed Vitals:   01/13/14 1330 01/13/14 1349 01/13/14 1428 01/13/14 1449  BP: 197/84 174/74 175/99 166/93  Pulse: 86 104 111 111  Temp:  98.1 F (36.7 C) 98.2 F (36.8 C) 99 F (37.2 C)  TempSrc:  Oral Oral Oral  Resp: 18 18  18   Height:      Weight:   62.2 kg (137 lb 2 oz)   SpO2:  99% 97% 99%    Intake/Output Summary (Last 24 hours) at 01/13/14 1452 Last data filed at 01/13/14 1349  Gross per 24 hour  Intake    220 ml  Output   2000 ml  Net  -1780 ml   Weight change: -6 kg (-13 lb 3.6 oz) Exam:   General:  Pt is alert, follows commands appropriately, not in acute distress  HEENT: No icterus, No thrush, No neck mass, [place]/AT  Cardiovascular: RRR, S1/S2, no rubs, no gallops  Respiratory: CTA bilaterally, no wheezing, no crackles, no rhonchi  Abdomen: Soft/+BS, non tender, non distended, no  guarding  Extremities: No edema, No lymphangitis, No petechiae, No rashes, no synovitis  Data Reviewed: Basic Metabolic Panel:  Recent Labs Lab 01/09/14 0320 01/10/14 0432 01/11/14 0500 01/12/14 0400 01/13/14 0500  NA 141 143 142 139 141  K 4.2 3.7 3.6* 3.9 4.1  CL 96 98 99 97 99  CO2 22 25 25 24 25   GLUCOSE 92 112* 157* 187* 211*  BUN 20 30* 38* 18 29*  CREATININE 2.80* 3.78* 4.46* 2.58* 3.60*  CALCIUM 9.2 9.0 8.8 8.9 9.0  PHOS 4.0 4.7* 4.5 2.3 3.8   Liver Function Tests:  Recent Labs Lab 01/09/14 0320 01/10/14 0432 01/11/14 0500 01/12/14 0400 01/13/14 0500  ALBUMIN 2.1* 1.8* 1.6* 1.8* 1.7*   No results found for this basename: LIPASE, AMYLASE,  in the last 168 hours No results found for this basename: AMMONIA,  in the last 168 hours CBC:  Recent Labs Lab 01/09/14 0320 01/10/14 0432 01/11/14 0500 01/12/14 0400 01/13/14 1038  WBC 18.4* 17.2* 16.2* 15.5* 14.7*  HGB 10.8* 9.8* 10.0* 10.0* 8.8*  HCT 33.4* 30.4* 31.3* 31.2* 28.7*  MCV  83.9 83.7 85.1 84.3 86.4  PLT 267 231 201 203 214   Cardiac Enzymes: No results found for this basename: CKTOTAL, CKMB, CKMBINDEX, TROPONINI,  in the last 168 hours BNP: No components found with this basename: POCBNP,  CBG:  Recent Labs Lab 01/12/14 1011 01/12/14 1620 01/12/14 2024 01/13/14 0022 01/13/14 0458  GLUCAP 173* 174* 208* 203* 191*    Recent Results (from the past 240 hour(s))  SURGICAL PCR SCREEN     Status: None   Collection Time    01/09/14 12:18 AM      Result Value Ref Range Status   MRSA, PCR NEGATIVE  NEGATIVE Final   Staphylococcus aureus NEGATIVE  NEGATIVE Final   Comment:            The Xpert SA Assay (FDA     approved for NASAL specimens     in patients over 9 years of age),     is one component of     a comprehensive surveillance     program.  Test performance has     been validated by [employer] for patients greater     than or equal to 68 year old.     It is not intended      to diagnose infection nor to     guide or monitor treatment.     Scheduled Meds: . amLODipine  10 mg Oral Daily  . darbepoetin (ARANESP) injection - DIALYSIS  60 mcg Intravenous Q Tue-HD  . docusate sodium  100 mg Oral Daily  . doxercalciferol  2 mcg Intravenous Q M,W,F-HD  . feeding supplement (NEPRO CARB STEADY)  237 mL Oral TID WC  . insulin aspart  0-15 Units Subcutaneous TID WC  . labetalol  100 mg Oral BID  . lanthanum  500 mg Oral TID WC  . pantoprazole  40 mg Oral Daily  . polyethylene glycol  17 g Oral Daily  . sodium chloride  3 mL Intravenous Q12H   Continuous Infusions: . dextrose 5 % and 0.45% NaCl       Roald Lukacs, DO  [facility] Pager [phone]  If 7PM-7AM, please contact night-coverage [email or web address] Password [email or web address] 01/13/2014, 2:52 PM   LOS: 13 days

## 2014-01-14 DIAGNOSIS — F39 Unspecified mood [affective] disorder: Secondary | ICD-10-CM

## 2014-01-14 DIAGNOSIS — I4719 Other supraventricular tachycardia: Secondary | ICD-10-CM

## 2014-01-14 DIAGNOSIS — I471 Supraventricular tachycardia: Secondary | ICD-10-CM

## 2014-01-14 LAB — RENAL FUNCTION PANEL
ALBUMIN: 1.7 g/dL — AB (ref 3.5–5.2)
BUN: 17 mg/dL (ref 6–23)
CHLORIDE: 98 meq/L (ref 96–112)
CO2: 26 mEq/L (ref 19–32)
Calcium: 9 mg/dL (ref 8.4–10.5)
Creatinine, Ser: 2.69 mg/dL — ABNORMAL HIGH (ref 0.50–1.10)
GFR calc Af Amer: 19 mL/min — ABNORMAL LOW (ref >90)
GFR, EST NON AFRICAN AMERICAN: 17 mL/min — AB (ref >90)
Glucose, Bld: 128 mg/dL — ABNORMAL HIGH (ref 70–99)
PHOSPHORUS: 3.3 mg/dL (ref 2.3–4.6)
POTASSIUM: 3.4 meq/L — AB (ref 3.7–5.3)
SODIUM: 141 meq/L (ref 137–147)

## 2014-01-14 LAB — CBC
HCT: 28.2 % — ABNORMAL LOW (ref 36.0–46.0)
Hemoglobin: 8.8 g/dL — ABNORMAL LOW (ref 12.0–15.0)
MCH: 26.9 pg (ref 26.0–34.0)
MCHC: 31.2 g/dL (ref 30.0–36.0)
MCV: 86.2 fL (ref 78.0–100.0)
PLATELETS: 225 10*3/uL (ref 150–400)
RBC: 3.27 MIL/uL — AB (ref 3.87–5.11)
RDW: 18.1 % — AB (ref 11.5–15.5)
WBC: 13 10*3/uL — AB (ref 4.0–10.5)

## 2014-01-14 LAB — TSH: TSH: 2.65 u[IU]/mL (ref 0.350–4.500)

## 2014-01-14 LAB — GLUCOSE, CAPILLARY
GLUCOSE-CAPILLARY: 124 mg/dL — AB (ref 70–99)
Glucose-Capillary: 282 mg/dL — ABNORMAL HIGH (ref 70–99)
Glucose-Capillary: 298 mg/dL — ABNORMAL HIGH (ref 70–99)
Glucose-Capillary: 382 mg/dL — ABNORMAL HIGH (ref 70–99)

## 2014-01-14 MED ORDER — NA FERRIC GLUC CPLX IN SUCROSE 12.5 MG/ML IV SOLN
125.0000 mg | INTRAVENOUS | Status: DC
  Administered 2014-01-15: 125 mg via INTRAVENOUS
  Filled 2014-01-14 (×2): qty 10

## 2014-01-14 NOTE — Progress Notes (Signed)
OT Cancellation Note  Patient Details Name: [name] MRN: [id number] DOB: 1941-01-10   Cancelled Treatment:    Reason Eval/Treat Not Completed: Medical issues which prohibited therapy pt undergoing EKG for afib. Noted K+ 3.4. Will wait for MD to see and decide re: repleting K+.   [name] [phone] 01/14/2014, 9:48 AM

## 2014-01-14 NOTE — Progress Notes (Signed)
Palliative shadow along with chart and will re-engage as needed. Please call with any further concerns/needs. Thank you.  Yong Channel, NP Palliative Medicine Team Pager # [phone] (M-F 8a-5p) Team Phone # [phone] (Nights/Weekends)

## 2014-01-14 NOTE — Progress Notes (Addendum)
[facility]  Subjective  - Pain is still an issue asking for pain medications.   Objective 153/75 88 98.2 F (36.8 C) (Oral) 16 93%  Intake/Output Summary (Last 24 hours) at 01/14/14 0829 Last data filed at 01/13/14 1630  Gross per 24 hour  Intake    240 ml  Output   2000 ml  Net  -1760 ml   Left Lower Extremity Venous Duplex Evaluation 01/13/2014: Summary:  - No obvious evidence of deep vein thrombosis involving the left lower extremity. An enlarged inguinal lymph node is noted in the left leg. Pulsatile waveforms suggest fluid overload. - No evidence of Baker's cyst on the left.  Right stump incision healing well Drain output less than 50 cc D/C drain  Dry dressing applied   Assessment/Planning: POD #2 1AMPUTATION ABOVE KNEE- RIGHT Plan for SNF   Coumadin held for pre-op procedure.  She was on it secondary to DVT? Now in A-fib./  Clinton Gallant [facility] 01/14/2014 8:29 AM --  Laboratory Lab Results:  Recent Labs  01/13/14 1038 01/14/14 0430  WBC 14.7* 13.0*  HGB 8.8* 8.8*  HCT 28.7* 28.2*  PLT 214 225   BMET  Recent Labs  01/13/14 0500 01/14/14 0430  NA 141 141  K 4.1 3.4*  CL 99 98  CO2 25 26  GLUCOSE 211* 128*  BUN 29* 17  CREATININE 3.60* 2.69*  CALCIUM 9.0 9.0    COAG Lab Results  Component Value Date   INR 1.30 01/04/2014   INR 1.52* 01/03/2014   INR 1.55* 01/02/2014   No results found for this basename: PTT      I have examined the patient, reviewed and agree with above.  Nikeshia Keetch, MD 01/14/2014 9:54 AM

## 2014-01-14 NOTE — Evaluation (Signed)
Occupational Therapy Evaluation Patient Details Name: [name] MRN: [id number] DOB: 12/24/1940 Today's Date: 01/14/2014    History of Present Illness pt admitted with claudication ischemia of R foot; Rt AKA  4/1   Clinical Impression   This 73 yo female admitted and underwent above presents to acute OT with decreased balance, increased pain, NWB;ing RLE, uncontrolled diarrhea, apparent close to total vision loss, decreased strength overall all affecting her ability to help care for herself. She will benefit from acute OT with follow up OT at SNF.    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 and can not find them independently  Transfers Overall transfer level: Needs assistance Equipment used: None Transfers: Squat Pivot Transfers     Squat pivot transfers: Max assist;+2 physical assistance     General transfer comment: see OT note re: vision and transfers; pivot to her Lt x 2; pt incontinent of bowels during transfer bed to chair and required return to bed for cleaning    Balance Overall balance assessment: Needs assistance Sitting-balance support: Bilateral upper extremity supported;Feet supported Sitting balance-Leahy Scale: Poor Sitting balance - Comments: able to maintain sitting EOB with minguard assist                                    ADL Overall ADL's : Needs assistance/impaired Eating/Feeding: Total assistance (due to says she cannot see)   Grooming: Set up;Supervision/safety (with max cues where to find items due  to decreased vision)   Upper Body Bathing: Minimal assitance Upper Body Bathing Details (indicate cue type and reason): supported sitting with max VC's where locate items due to decreased vision Lower Body Bathing: Total assistance;Bed level   Upper Body Dressing : Maximal assistance Upper Body Dressing Details (indicate cue type and reason): supported sitting Lower Body Dressing: Total assistance;Bed level   Toilet Transfer: +2 for physical assistance;Maximal assistance (squat pivot bed<>recliner)   Toileting- Clothing Manipulation and Hygiene: Bed level;Total assistance       Functional mobility during ADLs: Maximal assistance;+2 for physical assistance                          Extremity/Trunk Assessment Upper Extremity Assessment Upper Extremity Assessment: Generalized weakness   Lower Extremity Assessment Lower Extremity Assessment: RLE deficits/detail;LLE deficits/detail RLE Deficits / Details: new AKA; pt able to actively lift residual limb with tactile cues and incr time LLE Deficits / Details: AAROM WFL; strength 3-3+/5   Cervical / Trunk Assessment Cervical / Trunk Assessment: Other exceptions Cervical / Trunk Exceptions: weak in sitting (easily fell backwards with perturbation)   Communication Communication Communication: No difficulties   Cognition Arousal/Alertness: Awake/alert Behavior During Therapy: WFL for tasks assessed/performed Overall Cognitive Status: Within Functional Limits for tasks assessed                                Home Living Family/patient  expects to be discharged to:: Skilled nursing facility                                        Prior Functioning/Environment Level of Independence: Independent             OT Diagnosis: Generalized weakness;Blindness and low vision   OT Problem List: Decreased strength;Impaired balance (sitting and/or standing);Impaired vision/perception;Decreased knowledge  of use of DME or AE   OT Treatment/Interventions: Self-care/ADL training;Therapeutic activities;Patient/family education;Balance training;DME and/or AE instruction    OT Goals(Current goals can be found in the care plan section) Acute Rehab OT Goals Patient Stated Goal: pt wanted to get into chair OT Goal Formulation: With patient Time For Goal Achievement: 01/21/14 Potential to Achieve Goals: Good  OT Frequency: Min 2X/week           Co-evaluation PT/OT/SLP Co-Evaluation/Treatment: Yes Reason for Co-Treatment: Complexity of the patient's impairments (multi-system involvement)   OT goals addressed during session: ADL's and self-care;Strengthening/ROM      End of Session Equipment Utilized During Treatment: Gait belt Nurse Communication:  (I am concerned about her vision)  Activity Tolerance: Patient tolerated treatment well Patient left: in bed;with call bell/phone within reach (call bell modified with raised surface for nurse due to decreased vision; call front desk and asked them to get her a soft touch call bell)   Time: 4166-0630 OT Time Calculation (min): 68 min Charges:  OT General Charges $OT Visit: 1 Procedure OT Evaluation $Initial OT Evaluation Tier I: 1 Procedure OT Treatments $Self Care/Home Management : 23-37 mins  [name] [phone] 01/14/2014, 4:18 PM

## 2014-01-14 NOTE — Progress Notes (Signed)
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 Hart RochesterLawson  -discussed with Dr. Hart RochesterLawson 01/13/14--no need to restart warfarin from vascular/PVD standpoint -After speaking with daughter 01/13/14--no compelling evidence of recurrent DVT--previous warfarin use due to PVD per daughter -01/13/14 venous duplex LLE--no DVT -will not restart warfarin at this time Multifocal Atrial Tachycardia -increased labetalol with improvement in rate control Left hand Steal/digital ischemia-  -due to ischemic steal,  -s/p left brachiocephalic fistula ligation on 3/28.  Cellulitis of the right foot -  -No longer active issue status post right AKA  -Had been on Zosyn since 01/01/2014  -Discontinue antibiotics postop R-AKA  Left arm numbness/ Sensory disturbance -  -01/06/2014 -MRI brain negative for acute events but chronic ischemic changes  Confusion  -?previously due to uremia  - neurology consulted, appreciate input. Patient's exam inconsistent per neurology notes.  -unclear baseline  Chronic systolic congestive heart failure  -2 D ECHO 01/02/14 --> EF 30 - 35 %  - undergoing HD per nephrology  CKD (chronic kidney disease), stage V, now ESRD  - on HD, first 3 sessions via fistula, now via PC after fistula ligation.  -appreciate renal followup  COPD (chronic obstructive pulmonary disease)  - clinically compensated,  -Remain stable on 1.5L-->97% SaO2 Essential hypertension  -continue Amlodipine and labetalol -Pain is contributing to elevated blood pressure  Anemia of chronic disease/CKD  - no signs of active bleeding but drop in hg since admission  - aranesp started per nephrology team  -Small drop in hemoglobin postoperatively-Continue monitor  -Repeat hemoglobin remained stable DM type 2, uncontrolled,  with renal complications  - reasonable inpatient control  - A1C (3/21) 8.1  - continue SSI while inpatient  Leukocytosis - secondary to right hallux ischemia and cellulitis  -Improving off of antibiotics post right AKA HLD - continue statin as per home medical regimen  Moderate malnutrition - albumin ~2, secondary to progressive nature of the complex medical conditions outlined above  - Patient eating very little, states that food gives her generalized pain throughout her entire body.  -Continue Nepro shakes 3 times a day  Goals of care - patient with progressive decline in the past 4-5 months, initially her wishes were DNR/DNI and she favored no dialysis last November when she wanted to think about it.  -now unable to make decisions as she has intermittent confusion and her daughter is making her medical decisions, and states that her mother wishes dialysis and to be a full code.  -Appreciate palliative care consult.  Dispo--daughter now desires SNF, updated on phone--[facility] if [facility]       Procedures/Studies: Dg Chest 2 View  01/01/2014   CLINICAL DATA:  Short of breath upon exertion  EXAM: CHEST  2 VIEW  COMPARISON:  DG CHEST 2 VIEW dated 12/22/2013  FINDINGS: Sternotomy wires overlie enlarged cardiac silhouette. There is mild increase in fine airspace disease in the left and right lung. Bilateral small effusions are present. The right effusion is larger but not changed from prior. No pneumothorax.  IMPRESSION: Congestive heart failure pattern which is mildly worsened from prior. .   Electronically Signed   By: Genevive BiStewart  Edmunds M.D.   On: 01/01/2014 18:11   Dg Chest 2 View  12/22/2013   CLINICAL DATA Fatigue. Shortness of breath. Current history of diabetes and hypertension. Prior CABG.  EXAM CHEST  2 VIEW  COMPARISON DG CHEST 2V dated 11/24/2013; DG CHEST 2V dated 08/27/2013; DG CHEST 2 VIEW dated 08/17/2013; CT CHEST W/O CM dated 02/17/2013; DG CHEST 2V dated 07/28/2012   FINDINGS Cardiac silhouette moderately to markedly enlarged but stable. Thoracic aorta atherosclerotic, unchanged. Hilar and mediastinal contours otherwise unremarkable. Diffuse interstitial and airspace pulmonary edema, superimposed upon COPD/emphysema. Nodular scarring in the right lung apex, unchanged. Moderately large right pleural effusion and associated consolidation in the right lower lobe. No visible left pleural effusion. Degenerative changes involving the thoracic spine.  IMPRESSION Mild CHF superimposed upon COPD/emphysema. Moderate size right pleural effusion and associated passive atelectasis in the right lower lobe.  SIGNATURE  Electronically Signed   By: Hulan Saas M.D.   On: 12/22/2013 19:46   Ct Head Wo Contrast  01/05/2014   CLINICAL DATA:  Decreased mental status.  Left hand numbness.  EXAM: CT HEAD WITHOUT CONTRAST  TECHNIQUE: Contiguous axial images were obtained from the base of the skull through the vertex without intravenous contrast.  COMPARISON:  MR HEAD W/O CM dated 01/03/2014; DG ANG/EXT/UNI/OR*L* dated 06/26/2011  FINDINGS: There is no evidence of acute intracranial hemorrhage, mass lesion, brain edema or extra-axial fluid collection. The ventricles and subarachnoid spaces are appropriately sized for age. There is no CT evidence of acute cortical infarction. Mild periventricular white matter disease appears stable. There diffuse intracranial vascular calcifications.  The visualized paranasal sinuses, mastoid air cells and middle ears are clear. The calvarium is intact.  IMPRESSION: Stable appearance of the brain.  No acute intracranial findings.   Electronically Signed   By: Roxy Horseman M.D.   On: 01/05/2014 12:58   Mr Brain Wo Contrast  01/06/2014   CLINICAL DATA:  Evaluate for stroke. Decreased mental status. Left hand numbness.  EXAM: MRI HEAD WITHOUT CONTRAST  TECHNIQUE: Multiplanar, multiecho pulse sequences of the brain and surrounding structures were obtained without  intravenous contrast.  COMPARISON:  CT HEAD W/O CM dated 01/05/2014; MR HEAD W/O CM dated 01/03/2014  FINDINGS: No evidence for acute infarction, hemorrhage, mass lesion, hydrocephalus, or extra-axial fluid. Unchanged atrophy with small vessel disease.  No changes suggestive of posterior reversible encephalopathy syndrome.  Post infusion, no abnormal enhancement of the brain or meninges. Low T1 signal intensity bone marrow due to renal disease. Mild pannus. No acute sinus, orbital, or mastoid disease.  IMPRESSION: No acute stroke or changes suggestive of hypertensive encephalopathy.  Atrophy and small vessel disease, stable.   Electronically Signed   By: Davonna Belling M.D.   On: 01/06/2014 21:17   Mr Brain Wo Contrast  01/03/2014   CLINICAL DATA:  Left arm numbness. Evaluate for stroke versus related to recent surgical procedure.  EXAM: MRI HEAD WITHOUT CONTRAST  TECHNIQUE: Multiplanar, multiecho pulse sequences of the brain and surrounding structures were obtained without intravenous contrast.  COMPARISON:  None.  FINDINGS: The patient was unable to remain motionless for the exam. Small or subtle lesions could be overlooked.  No acute stroke is evident. There is generalized atrophy with moderate chronic microvascular ischemic change. Remote right pontine and right basal ganglia lacunes are observed. Diffuse marrow signal abnormality consistent with chronic anemia/chronic renal disease. Cervical spondylosis. Left C1-C2 joint effusion, likely degenerative. Mild pannus.  IMPRESSION: Atrophy.  Chronic microvascular ischemic change.  Remote areas of ischemia without visible acute infarction. No underlying abnormality is seen which might contribute to left arm numbness.   Electronically Signed   By: Davonna Belling M.D.   On: 01/03/2014 17:57  Dg Chest Port 1 View  01/09/2014   CLINICAL DATA:  Central line placement  EXAM: PORTABLE CHEST - 1 VIEW  COMPARISON:  01/01/2014  FINDINGS: Dual-lumen central line, tip at the  level of the upper right atrium. No evidence of pneumothorax.  Unchanged cardiopericardial enlargement. Status post CABG. Worsening diffuse interstitial opacity. There are small layering pleural effusions, right more than left.  IMPRESSION: 1. Right IJ central line with the lowest catheter tip in the upper right atrium. No pneumothorax. 2. Worsening CHF.   Electronically Signed   By: Tiburcio Pea M.D.   On: 01/09/2014 02:43   Dg Foot Complete Right  12/22/2013   CLINICAL DATA Pain and swelling involving the medial right foot and the right great toe. Current history of diabetes.  EXAM RIGHT FOOT COMPLETE - 3+ VIEW  COMPARISON None.  FINDINGS No evidence of acute fracture or dislocation. Mild joint space narrowing involving the IP joints of multiple toes. Remaining joint spaces well preserved for age. Mild osseous demineralization. Very small enthesopathic spur at the insertion of the Achilles tendon on the posterior calcaneus. No other intrinsic osseous abnormalities. Extensive calcification involving the arteries of the foot.  IMPRESSION No acute osseous abnormality. 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  SIGNATURE  Electronically Signed   By: Hulan Saas M.D.   On: 12/22/2013 19:48   Dg Fluoro Guide Cv Line-no Report  01/09/2014   CLINICAL DATA: check for diatek placement   FLOURO GUIDE CV LINE  Fluoroscopy was utilized by the requesting physician.  No radiographic  interpretation.          Subjective: Patient denies fevers, chills, headache, chest pain, shortness breath, nausea, vomiting, diarrhea, abdominal pain.  Objective: Filed Vitals:   01/13/14 2100 01/13/14 2143 01/14/14 0443 01/14/14 1442  BP: 144/84 158/99 153/75 122/69  Pulse: 84 80 88 97  Temp: 99.4 F (37.4 C)  98.2 F (36.8 C) 98 F (36.7 C)  TempSrc: Oral  Oral Oral  Resp: 18  16 18   Height:      Weight:      SpO2: 93%  93% 90%    Intake/Output  Summary (Last 24 hours) at 01/14/14 1823 Last data filed at 01/14/14 1108  Gross per 24 hour  Intake    240 ml  Output      0 ml  Net    240 ml   Weight change: 0.6 kg (1 lb 5.2 oz) Exam:   General:  Pt is alert, follows commands appropriately, not in acute distress  HEENT: No icterus, No thrush,  [place]/AT  Cardiovascular: RRR, S1/S2, no rubs, no gallops  Respiratory: CTA bilaterally, no wheezing, no crackles, no rhonchi  Abdomen: Soft/+BS, non tender, non distended, no guarding  Extremities: No edemaLLE, No lymphangitis, No petechiae, No rashes, no synovitis; R-AKA without erythema or drainage  Data Reviewed: Basic Metabolic Panel:  Recent Labs Lab 01/10/14 0432 01/11/14 0500 01/12/14 0400 01/13/14 0500 01/14/14 0430  NA 143 142 139 141 141  K 3.7 3.6* 3.9 4.1 3.4*  CL 98 99 97 99 98  CO2 25 25 24 25 26   GLUCOSE 112* 157* 187* 211* 128*  BUN 30* 38* 18 29* 17  CREATININE 3.78* 4.46* 2.58* 3.60* 2.69*  CALCIUM 9.0 8.8 8.9 9.0 9.0  PHOS 4.7* 4.5 2.3 3.8 3.3   Liver Function Tests:  Recent Labs Lab 01/10/14 0432 01/11/14 0500 01/12/14 0400 01/13/14 0500 01/14/14 0430  ALBUMIN 1.8* 1.6*  1.8* 1.7* 1.7*   No results found for this basename: LIPASE, AMYLASE,  in the last 168 hours No results found for this basename: AMMONIA,  in the last 168 hours CBC:  Recent Labs Lab 01/10/14 0432 01/11/14 0500 01/12/14 0400 01/13/14 1038 01/14/14 0430  WBC 17.2* 16.2* 15.5* 14.7* 13.0*  HGB 9.8* 10.0* 10.0* 8.8* 8.8*  HCT 30.4* 31.3* 31.2* 28.7* 28.2*  MCV 83.7 85.1 84.3 86.4 86.2  PLT 231 201 203 214 225   Cardiac Enzymes: No results found for this basename: CKTOTAL, CKMB, CKMBINDEX, TROPONINI,  in the last 168 hours BNP: No components found with this basename: POCBNP,  CBG:  Recent Labs Lab 01/13/14 1631 01/13/14 2115 01/14/14 0604 01/14/14 1130 01/14/14 1617  GLUCAP 133* 145* 124* 282* 298*    Recent Results (from the past 240 hour(s))  SURGICAL PCR  SCREEN     Status: None   Collection Time    01/09/14 12:18 AM      Result Value Ref Range Status   MRSA, PCR NEGATIVE  NEGATIVE Final   Staphylococcus aureus NEGATIVE  NEGATIVE Final   Comment:            The Xpert SA Assay (FDA     approved for NASAL specimens     in patients over 50 years of age),     is one component of     a comprehensive surveillance     program.  Test performance has     been validated by [employer] for patients greater     than or equal to 42 year old.     It is not intended     to diagnose infection nor to     guide or monitor treatment.     Scheduled Meds: . amLODipine  10 mg Oral Daily  . darbepoetin (ARANESP) injection - DIALYSIS  60 mcg Intravenous Q Tue-HD  . docusate sodium  100 mg Oral Daily  . doxercalciferol  2 mcg Intravenous Q M,W,F-HD  . feeding supplement (NEPRO CARB STEADY)  237 mL Oral TID WC  . [START ON 01/15/2014] ferric gluconate (FERRLECIT/NULECIT) IV  125 mg Intravenous Q Sat-HD  . insulin aspart  0-15 Units Subcutaneous TID WC  . labetalol  200 mg Oral BID  . lanthanum  500 mg Oral TID WC  . pantoprazole  40 mg Oral Daily  . polyethylene glycol  17 g Oral Daily  . sodium chloride  3 mL Intravenous Q12H   Continuous Infusions: . dextrose 5 % and 0.45% NaCl       Javonda Suh, DO  [facility] Pager [phone]  If 7PM-7AM, please contact night-coverage [email or web address] Password [email or web address] 01/14/2014, 6:23 PM   LOS: 14 days
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
## 2014-01-14 NOTE — Progress Notes (Signed)
Rehab admissions - I reviewed the case and noted rehab MD's recommendation for skilled nursing. In addition, I saw the latest PT notes also indicate SNF is most appropriate for pt's rehab needs.  I called and spoke with Lillia Abed, Social worker who said they have a plan in place for SNF with the pt/family and it was not necessary for me to speak with the family/pt.  I will sign off the case and recommend SNF for discharge. Please call me with any questions. Thanks.  Juliann Mule, PT Rehabilitation Admissions Coordinator [phone]

## 2014-01-14 NOTE — Progress Notes (Signed)
Physical Therapy Re-evaluation Patient Details Name: [name] MRN: [id number] DOB: 07/07/1941 Today's Date: 01/14/2014    History of Present Illness pt admitted with claudication ischemia of R foot; Rt AKA  4/1    PT Comments    Pt very motivated, however limited by pain and incontinence of bowels. Pt reporting complete blindness since ?Wed of this week (see also OT note for details). This also impairs her mobility. Very pleasant and eager to improve her mobility and strength.  Follow Up Recommendations  SNF;Supervision/Assistance - 24 hour     Equipment Recommendations  None recommended by PT    Recommendations for Other Services       Precautions / Restrictions Precautions Precautions: Fall    Mobility  Bed Mobility Overal bed mobility: Needs Assistance Bed Mobility: Rolling;Sidelying to Sit;Sit to Sidelying Rolling: Min assist Sidelying to sit: Mod assist     Sit to sidelying: Mod assist;+2 for physical assistance General bed mobility comments: verbal and tactile cues for use of rails as pt reports full blindness and can not find them independently  Transfers Overall transfer level: Needs assistance Equipment used: None Transfers: Squat Pivot Transfers     Squat pivot transfers: Max assist;+2 physical assistance     General transfer comment: see OT note re: vision and transfers; pivot to her Lt x 2; pt incontinent of bowels during transfer bed to chair and required return to bed for cleaning  Ambulation/Gait                 Stairs            Wheelchair Mobility    Modified Rankin (Stroke Patients Only)       Balance Overall balance assessment: Needs assistance Sitting-balance support: Bilateral upper extremity supported;Feet supported Sitting balance-Leahy Scale: Poor Sitting balance - Comments: able to maintain sitting EOB with minguard assist                            Cognition Arousal/Alertness: Awake/alert Behavior  During Therapy: WFL for tasks assessed/performed Overall Cognitive Status: Within Functional Limits for tasks assessed                      Exercises      General Comments General comments (skin integrity, edema, etc.): Pt reports her bottom is sore (BM was loose/diarrhea). Barrier cream applied; positioned on her side and RN informed.      Pertinent Vitals/Pain RLE with AROM, bed mobility. RN in to provide pain medicine.    Home Living Family/patient expects to be discharged to:: Skilled nursing facility                    Prior Function Level of Independence: Independent          PT Goals (current goals can now be found in the care plan section) Acute Rehab PT Goals Patient Stated Goal: pt wanted to get into chair PT Goal Formulation: With patient Time For Goal Achievement: 01/28/14 Potential to Achieve Goals: Good    Frequency  Min 2X/week    PT Plan      Co-evaluation             End of Session Equipment Utilized During Treatment: Gait belt Activity Tolerance: Patient limited by pain Patient left: in bed;with call bell/phone within reach     Time: [id number] PT Time Calculation (min): 72 min  Charges:  $Therapeutic Activity: 23-37  mins                    G Codes:      [name] 02/05/2014, 3:48 PM Pager [phone]

## 2014-01-14 NOTE — Progress Notes (Signed)
PT Cancellation Note  Patient Details Name: [name] MRN: [id number] DOB: 1940/12/02   Cancelled Treatment:    Reason Eval/Treat Not Completed: Medical issues which prohibited therapy--pt undergoing EKG for afib. Noted K+ 3.4. Will wait for MD to see and decide re: repleting K+.   Taheerah Guldin 01/14/2014, 9:41 AM Pager [phone]

## 2014-01-14 NOTE — Progress Notes (Addendum)
Update: CSW received phone call from patient's daughter stating she is sitting there with patient's husband and handed him the phone. CSW spoke to patient's husband over the phone and he stated SNF was a good option and was agreeable to [facility]. Patient's husband states it is okay to speak with patient's daughter, so she can inform husband. Patient's daughter then got back on the phone and states [facility] is fine and dc tomorrow would be great. CSW paged MD with this information.  CSW went to speak to patient about snf placement. CSW explained snf and went over the options in [facility]. Patient states she has been to [facility] before and would be open to go back. Patient states that she would like her husband to make decisions for her if patient was unable to make decisions. CSW attempted to call patient's husband, the numbers given did not work. CSW then called patient's daughter to ask for patient's husbands phone number. Patient's daughter stated, "she will have to call patient's husband to see if she can give his phone number out." When social worker asked patient who she wanted to make decisions, patient stated "my husband and if he is not able too, then I will make the decisions." Due to documentation in the chart of patient having confusing episodes times, CSW asked MD to order psych consult to determine capacity to see if patient is able to make her own decisions. CSW continuing to follow. CSW confirmed with facility that they are able to take patient over the weekend.  [name], MSW, Theresia Majors [phone]

## 2014-01-14 NOTE — Progress Notes (Signed)
Patient ID: Rhonda EvesMary P [name], female   DOB: 02/05/1941, [age] y.o.   MRN: [id number]   [facility] Progress Note    Assessment/ Plan:    Chronic kidney disease stage V   New ESRD Initiated HD this admit.   On TTS schedule   Clipped to [facility] TTS 2nd shift when discharge time rolls around  Had AVF ligated 3/28 secondary to steal/ischemic 5th digit - [facility] Not appropriate to rush into another access procedure quite yet   Establishing EDW post amputation  Right foot ischemia:    S/p AKA by Dr. Hart RochesterLawson (4/1) . 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  Dispo - Family dynamic complicated; pall care involved    Subjective:   Dialysis yesterday Establishing EDW post amputation 64.7->62.2 post HD Denies CP but having leg pain Getting ECG - new atrial arrythmia   Objective:   BP 153/75  Pulse 88  Temp(Src) 98.2 F (36.8 C) (Oral)  Resp 16  Ht 5\' 3"  (1.6 m)  Wt 62.2 kg (137 lb 2 oz)  BMI 24.30 kg/m2  SpO2 93%  Intake/Output Summary (Last 24 hours) at 01/14/14 0910 Last data filed at 01/13/14 1630  Gross per 24 hour  Intake    240 ml  Output   2000 ml  Net  -1760 ml   Weight change: 0.6 kg (1 lb 5.2 oz)  Physical Exam: Gen: NAD EKG tech in room CVS: Irreg irreg rhythm S1S2 No S3; 2/6 murmur USB  Resp: Clear to auscultation bilaterally, no rales/rhonchi Abd: Soft, flat, nontender Ext: right AKA dressing and hemovac in place.  No LLE edema Left upper arm AVF ligated- fifth digit dusky/tip dry gangrene; small areas discoloration 4th digit    Recent Labs Lab 01/08/14 0353  01/09/14 0320 01/10/14 0432 01/11/14 0500 01/12/14 0400 01/13/14 0500 01/14/14 0430  NA 138 141 143 142 139 141 141  K 3.8 4.2 3.7 3.6* 3.9 4.1 3.4*  CL 94* 96 98 99 97 99 98  CO2 20 22 25 25 24 25 26   GLUCOSE 77 92 112* 157* 187* 211* 128*  BUN 40* 20 30* 38* 18 29* 17  CREATININE 3.97* 2.80* 3.78* 4.46* 2.58* 3.60* 2.69*  CALCIUM 8.9 9.2 9.0 8.8 8.9 9.0 9.0  PHOS 3.9 4.0 4.7* 4.5 2.3 3.8 3.3    Recent Labs Lab 01/11/14 0500 01/12/14 0400 01/13/14 1038 01/14/14 0430  WBC 16.2* 15.5* 14.7* 13.0*  HGB 10.0* 10.0* 8.8* 8.8*  HCT 31.3* 31.2* 28.7* 28.2*  MCV 85.1 84.3 86.4 86.2  PLT 201 203 214 225   Medications:    . amLODipine  10 mg Oral Daily  . darbepoetin (ARANESP) injection - DIALYSIS  60 mcg Intravenous Q Tue-HD  . docusate sodium  100 mg Oral Daily  . doxercalciferol  2 mcg Intravenous Q M,W,F-HD  . feeding supplement (NEPRO CARB STEADY)  237 mL Oral TID WC  . insulin aspart  0-15 Units Subcutaneous TID WC  . labetalol  200 mg Oral BID  . lanthanum  500 mg Oral TID WC  . pantoprazole  40 mg  Oral Daily  . polyethylene glycol  17 g Oral Daily  . sodium chloride  3 mL Intravenous Q12H    Camille Bal, MD [facility] [phone] Pager 01/14/2014, 9:10 AM

## 2014-01-15 DIAGNOSIS — I498 Other specified cardiac arrhythmias: Secondary | ICD-10-CM

## 2014-01-15 LAB — RENAL FUNCTION PANEL
Albumin: 1.7 g/dL — ABNORMAL LOW (ref 3.5–5.2)
BUN: 35 mg/dL — AB (ref 6–23)
CO2: 27 mEq/L (ref 19–32)
CREATININE: 3.99 mg/dL — AB (ref 0.50–1.10)
Calcium: 8.5 mg/dL (ref 8.4–10.5)
Chloride: 97 mEq/L (ref 96–112)
GFR calc Af Amer: 12 mL/min — ABNORMAL LOW (ref >90)
GFR calc non Af Amer: 10 mL/min — ABNORMAL LOW (ref >90)
Glucose, Bld: 298 mg/dL — ABNORMAL HIGH (ref 70–99)
PHOSPHORUS: 3.6 mg/dL (ref 2.3–4.6)
Potassium: 4.2 mEq/L (ref 3.7–5.3)
Sodium: 141 mEq/L (ref 137–147)

## 2014-01-15 LAB — GLUCOSE, CAPILLARY
Glucose-Capillary: 263 mg/dL — ABNORMAL HIGH (ref 70–99)
Glucose-Capillary: 242 mg/dL — ABNORMAL HIGH (ref 70–99)

## 2014-01-15 MED ORDER — CARVEDILOL 12.5 MG PO TABS
12.5000 mg | ORAL_TABLET | Freq: Two times a day (BID) | ORAL | Status: DC
  Administered 2014-01-15: 12.5 mg via ORAL
  Filled 2014-01-15 (×2): qty 1

## 2014-01-15 MED ORDER — LANTHANUM CARBONATE 500 MG PO CHEW: 500.0000 mg | CHEWABLE_TABLET | Freq: Three times a day (TID) | ORAL | Status: DC

## 2014-01-15 MED ORDER — INSULIN GLARGINE 100 UNIT/ML ~~LOC~~ SOLN: 10.0000 [IU] | Freq: Every day | SUBCUTANEOUS | Status: DC

## 2014-01-15 MED ORDER — INSULIN ASPART 100 UNIT/ML ~~LOC~~ SOLN
0.0000 [IU] | Freq: Three times a day (TID) | SUBCUTANEOUS | Status: DC
  Administered 2014-01-15: 3 [IU] via SUBCUTANEOUS

## 2014-01-15 MED ORDER — HYDROMORPHONE HCL PF 1 MG/ML IJ SOLN
INTRAMUSCULAR | Status: AC
  Administered 2014-01-15: 1 mg
  Filled 2014-01-15: qty 1

## 2014-01-15 MED ORDER — INSULIN ASPART 100 UNIT/ML ~~LOC~~ SOLN: 0.0000 [IU] | Freq: Three times a day (TID) | SUBCUTANEOUS | Status: DC

## 2014-01-15 MED ORDER — INSULIN GLARGINE 100 UNIT/ML ~~LOC~~ SOLN
10.0000 [IU] | Freq: Every day | SUBCUTANEOUS | Status: DC
  Filled 2014-01-15: qty 0.1

## 2014-01-15 MED ORDER — NEPRO/CARBSTEADY PO LIQD: 237.0000 mL | Freq: Three times a day (TID) | ORAL | Status: DC

## 2014-01-15 MED ORDER — HEPARIN SODIUM (PORCINE) 1000 UNIT/ML IJ SOLN
1200.0000 [IU] | Freq: Once | INTRAMUSCULAR | Status: AC
  Administered 2014-01-15: 1200 [IU] via INTRAVENOUS

## 2014-01-15 MED ORDER — OXYCODONE-ACETAMINOPHEN 10-325 MG PO TABS: 1.0000 | ORAL_TABLET | Freq: Three times a day (TID) | ORAL | Status: DC | PRN

## 2014-01-15 NOTE — Procedures (Signed)
I have personally attended this patient's dialysis session.   Plan for 2K bath (K 4.2) rather than 4K as originally ordered. UF goal 1 kg (was under 62 today pre HD - so set goal at 1 kg and post weight will be EDW) Access TDC BFR 400. Anticipate D/C to SNF after discharge  Vamsi Apfel B

## 2014-01-15 NOTE — Progress Notes (Signed)
Patient ID: [name], female   DOB: 04-20-1941, 73 y.o.   MRN: [id number] Comfortable. Right AKA dressing intact. We'll return to nursing facility today. Will be seen in the office in 3 weeks for staple removal.

## 2014-01-15 NOTE — Progress Notes (Signed)
Assessed per flow sheet. C/o chronic pain; "my bones." Dilaudid given per ordered. Repositioned. Patient requested to get out of bed but stated she wanted to wait until her pain medicine took affect.[name]

## 2014-01-15 NOTE — Progress Notes (Signed)
Patient ID: [name], female   DOB: 12/13/1940, 73 y.o.   MRN: [id number]   [facility] Progress Note    Assessment/ Plan:    Chronic kidney disease stage V   New ESRD Initiated HD this admit.   On TTS schedule   Clipped to [facility] TTS 2nd shift   Had AVF ligated 3/28 secondary to steal/ischemic 5th digit - now with [facility] (3/28) Not appropriate to rush into another access procedure quite yet   Establishing EDW post amputation Can start at [facility] on Tuesday; will need HD today prior to discharge Right foot ischemia:    S/p AKA by Dr. Hart Rochester (4/1) 3 week followup with [facility] Just on SSI while in the hospital Anemia:  Hemoglobin down post op Status post intravenous iron therapy for low Tsat; start weekly dose On aranesp  (60 QWed - changed to QTues with HD) Hypertension:  Norvasc/carvedilol  UF with HD Cardiac rhythm issue MAT BB Chronic systolic CHF EF 30-35  Volume controlled with HD Metabolic bone disease:  On fosrenol hectorol with HD (changed from calcitriol; need followup PTH; last I see was Nov - 396; ordered) Steal left hand Ligation of AVF d/t ischemic 5th digit Dry gangrene tip Mental status fluctuations Variable baseline Neuro has seen "inconsistent exam" Psyche has seen and feels pt lacks capacity to make decisions They recommend formalization of POA (daugher making all decisions; family dynamic complicated) Dispo  Anticipate discharge to SNF today Will convey orders to [facility]   Subjective:   For dialysis today prior to discharge Establishing EDW post amputation 64.7->62.2 post HD last time Will weigh pre and post in HD for new EDW   Objective:   BP 124/77  Pulse 73  Temp(Src) 98 F (36.7 C) (Oral)  Resp 18  Ht 5\' 3"  (1.6 m)  Wt 63.5 kg (139 lb 15.9 oz)  BMI 24.80 kg/m2  SpO2 91%  Intake/Output Summary (Last 24 hours) at 01/15/14 1041 Last data filed at 01/14/14 1843  Gross per 24 hour  Intake     600 ml  Output      0 ml  Net    600 ml   Weight change: -1.2 kg (-2 lb 10.3 oz)  Physical Exam: Gen: NAD  CVS: Irreg rhythm S1S2 No S3; 2/6 murmur USB  Resp: Clear to auscultation bilaterally, no rales/rhonchi Abd: Soft, flat, nontender Ext: right AKA dressing   No LLE edema Left upper arm AVF ligated- fifth digit dusky/tip dry gangrene; small areas discoloration 4th digit    Recent Labs Lab 01/09/14 0320 01/10/14 0432 01/11/14 0500 01/12/14 0400 01/13/14 0500 01/14/14 0430 01/15/14 0517  NA 141 143 142 139 141 141 141  K 4.2 3.7 3.6* 3.9 4.1 3.4* 4.2  CL 96 98 99 97 99 98 97  CO2 22 25 25 24 25 26 27   GLUCOSE 92 112* 157* 187* 211* 128* 298*  BUN 20 30* 38* 18 29* 17 35*  CREATININE 2.80* 3.78* 4.46* 2.58* 3.60* 2.69* 3.99*  CALCIUM 9.2 9.0 8.8 8.9 9.0 9.0 8.5  PHOS 4.0 4.7* 4.5 2.3 3.8 3.3 3.6    Recent Labs Lab 01/11/14 0500 01/12/14 0400 01/13/14 1038 01/14/14 0430  WBC 16.2* 15.5* 14.7* 13.0*  HGB 10.0* 10.0* 8.8* 8.8*  HCT 31.3* 31.2* 28.7* 28.2*  MCV 85.1 84.3 86.4 86.2  PLT 201 203 214 225   Medications:    . amLODipine  10 mg Oral Daily  . carvedilol  12.5 mg Oral BID WC  . darbepoetin (ARANESP) injection - DIALYSIS  60 mcg Intravenous Q Tue-HD  . docusate sodium  100 mg Oral Daily  . doxercalciferol  2 mcg Intravenous Q M,W,F-HD  . feeding supplement (NEPRO CARB STEADY)  237 mL Oral TID WC  . ferric gluconate (FERRLECIT/NULECIT) IV  125 mg Intravenous Q Sat-HD  . insulin aspart  0-15 Units Subcutaneous TID WC  . insulin glargine  10 Units Subcutaneous QHS  . lanthanum  500 mg Oral TID WC  . pantoprazole  40 mg Oral Daily  . polyethylene glycol  17 g Oral Daily  . sodium chloride  3 mL Intravenous Q12H    Camille Balynthia Lorayne Getchell, MD [facility] [phone] Pager 01/15/2014, 10:41 AM

## 2014-01-15 NOTE — Progress Notes (Signed)
Remains in dialysis. Ok for discharge to Rehab after dialysis. Report given to [facility]. Will report to evening shift and call transport when patient arrives to floor.Mamie Levers

## 2014-01-15 NOTE — Progress Notes (Signed)
Awaiting dialysis. C/o pain. Loss of IV d/t leaking. Attempted IV access with no success. IV team and dialysis staff notified. PO oxycodone x2 given per order. Patient states that she is having trouble with seeing. "It's been going on for 5 days." Family concerned. Family and patient's questions answered per Dr. Arbutus Leas.Mamie Levers

## 2014-01-15 NOTE — Discharge Summary (Addendum)
Physician Discharge Summary  [name] ZOX:[id number] DOB: [birth year] DOA: 12/31/2013  PCP: Michiel Sites, MD  Admit date: 12/31/2013 Discharge date: 01/15/2014  Recommendations for Outpatient Follow-up:  1. Pt will need to follow up with PCP in 2 weeks post discharge 2. Please obtain BMP to evaluate electrolytes and kidney function 3. Please also check CBC to evaluate Hg and Hct levels 4. Follow up with Dr. 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 Hart Rochester  -discussed with Dr. Hart Rochester 01/13/14--no need to restart warfarin from vascular/PVD standpoint  -After speaking with daughter 01/13/14--no compelling evidence of recurrent DVT--previous warfarin use due to PVD per daughter  -01/13/14 venous duplex LLE--no DVT  -will not restart warfarin at this time  -f/u Dr. Hart Rochester office in 3 weeks -keep ACE wrap on right stump for comfort Multifocal Atrial Tachycardia  -pt was switched to labetalol during admission -will change pt back to home carvedilol -now back in NSR Left hand Steal/digital ischemia-  -due to ischemic steal,  -s/p left brachiocephalic fistula ligation on 3/28.  Cellulitis of the right foot -  -No longer active issue status post right AKA  -Had been on Zosyn since 01/01/2014  -Discontinue antibiotics postop R-AKA  Left arm numbness/ Sensory disturbance -  -01/06/2014 -MRI brain negative for acute events but chronic ischemic changes  Confusion  -?previously due to uremia  - neurology consulted,  appreciate input. Patient's exam inconsistent per neurology notes. Ultimately it was felt that this may have been a toxic metabolic etiology. -unclear baseline -Psychiatry was consulted and did not feel the patient had the capacity to make any medical decisions. Chronic systolic congestive heart failure  -2 D ECHO 01/02/14 --> EF 30 - 35 %  - undergoing HD per nephrology  CKD (chronic kidney disease), stage V, now ESRD  - on HD, first 3 sessions via fistula, now via PC after fistula ligation.  -appreciate renal followup  COPD (chronic obstructive pulmonary disease)  - clinically compensated,  -Remain stable on 1.5L-->97% SaO2  Essential hypertension  -continue Amlodipine and coreg -Pain is contributing to elevated blood pressure  Anemia of chronic disease/CKD  - no signs of active bleeding but drop in hg since admission  - aranesp started per nephrology team  -Small drop in hemoglobin postoperatively-Continue monitor  -Repeat hemoglobin remained stable  DM type 2, uncontrolled, with renal complications  - reasonable inpatient control  - A1C (3/21) 8.1  - continue SSI--CBG 121-150--1unit; 151-200--2units; 201-250--3units; 251-300--5 units; 301-350--7units; 351-400--9units -add lantus 10units at bedtime -Patient should have her CBGs checked before each meal and at bedtime Leukocytosis - secondary to right hallux ischemia and cellulitis  -Improving off of antibiotics post right AKA  HLD - continue statin as per home medical regimen  Moderate malnutrition - albumin ~2, secondary to progressive nature of the complex medical conditions outlined above  - Patient eating very little, states that food gives her generalized pain throughout her entire body.  -Continue Nepro shakes 3 times a day  Goals of care - patient with progressive decline in the past 4-5 months, initially  her wishes were DNR/DNI and she favored no dialysis last November when she wanted to think about it.  -now unable to make  decisions as she has intermittent confusion and her daughter is making her medical decisions, and states that her mother wishes dialysis and to be a full code.  -Appreciate palliative care consult.     Discharge Condition: stable  Disposition: SNF Follow-up Information   Follow up with Josephina Gip, MD In 4 weeks. (sent message)    Specialty:  Vascular Surgery   Contact information:   [address] [place] [address] [phone]       Diet:renal Wt Readings from Last 3 Encounters:  01/15/14 63.5 kg (139 lb 15.9 oz)  01/15/14 63.5 kg (139 lb 15.9 oz)  01/15/14 63.5 kg (139 lb 15.9 oz)    History of present illness:  Pt is 73 yo AAF with multiple, complex medical problems including CKD stage V from uncontrolled diabetes and HTN , PVD, presented to [facility] ED March 20th, with main concern of progressively worsening discoloration of the right foot hallux associated with throbbing and constant pain, 10/10 in severity, worse with touching, cold to touch, no specific alleviating factors. No reported fevers on admission, no chest pain or shortness of breath. Main concern raised is management of the right ischemic hallux as iodinated contrast is required, risk being progression to ESRD and requiring HD. The patient has a left upper arm brachiocephalic fistula placed 09/07/2013. 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 Her fistula had to be ligated during the admission. A PermCath was placed for which dialysis was undertaken. Dialysis was initiated during the hospitalization. Outpatient dialysis was set up by nephrology.     Consultants: Nephrology Neurology VVS  Discharge Exam: Filed Vitals:   01/15/14 1007  BP: 124/77  Pulse: 73  Temp:   Resp:    Filed Vitals:   01/14/14 2127 01/15/14 0610 01/15/14 0649 01/15/14 1007  BP: 148/82  125/64 124/77  Pulse: 82  75 73  Temp: 98.2 F (36.8 C)  98 F (36.7 C)   TempSrc: Oral  Oral   Resp:   18     Height:      Weight:  63.5 kg (139 lb 15.9 oz)    SpO2: 95%  91%    General: alert and awake, NAD, pleasant, cooperative Cardiovascular: RRR, no rub, no gallop, no S3 Respiratory: CTAB, no wheeze, no rhonchi Abdomen:soft, nontender, nondistended, positive bowel sounds Extremities: No edema, No lymphangitis, no petechiae; right AKA stump intact  Discharge Instructions      Discharge Orders   Future Orders Complete By Expires   Diet - low sodium heart healthy  As directed    Increase activity slowly  As directed        Medication List    STOP taking these medications       cephALEXin 500 MG capsule  Commonly known as:  KEFLEX     clindamycin 300 MG capsule  Commonly known as:  CLEOCIN     furosemide 40 MG tablet  Commonly known as:  LASIX     warfarin 4 MG tablet  Commonly known as:  COUMADIN      TAKE these medications       acetaminophen 500 MG tablet  Commonly known as:  TYLENOL  Take 1,000-1,500 mg by mouth every 6 (six) hours as needed for mild pain or headache.     amLODipine 5 MG tablet  Commonly known as:  NORVASC  Take 10 mg by mouth daily.     atorvastatin 40 MG tablet  Commonly known as:  LIPITOR  Take 40 mg by mouth daily.     calcitRIOL 0.25 MCG capsule  Commonly known as:  ROCALTROL  Take 0.5 mcg by mouth daily.     carvedilol 12.5 MG tablet  Commonly known as:  COREG  Take 12.5 mg by mouth 2 (two) times daily with a meal.     feeding supplement (NEPRO CARB STEADY) Liqd  Take 237 mLs by mouth 3 (three) times daily with meals.     ferrous sulfate 325 (65 FE) MG tablet  Take 325 mg by mouth 2 (two) times daily.     folic acid 1 MG tablet  Commonly known as:  FOLVITE  Take 1 mg by mouth daily.     insulin aspart 100 UNIT/ML injection  Commonly known as:  novoLOG  Inject 0-9 Units into the skin 3 (three) times daily with meals.     insulin glargine 100 UNIT/ML injection  Commonly known as:  LANTUS  Inject 0.1 mLs (10 Units total)  into the skin at bedtime.     isosorbide mononitrate 30 MG 24 hr tablet  Commonly known as:  IMDUR  Take 15 mg by mouth daily.     lanthanum 500 MG chewable tablet  Commonly known as:  FOSRENOL  Chew 1 tablet (500 mg total) by mouth 3 (three) times daily with meals.     nitroGLYCERIN 0.4 MG SL tablet  Commonly known as:  NITROSTAT  Place 0.4 mg under the tongue every 5 (five) minutes as needed for chest pain.     omega-3 acid ethyl esters 1 G capsule  Commonly known as:  LOVAZA  Take 1 g by mouth 4 (four) times daily.     omeprazole 20 MG capsule  Commonly known as:  PRILOSEC  Take 20 mg by mouth daily.     oxyCODONE-acetaminophen 10-325 MG per tablet  Commonly known as:  PERCOCET  Take 1 tablet by mouth 3 (three) times daily as needed for pain.         The results of significant diagnostics from this hospitalization (including imaging, microbiology, ancillary and laboratory) are listed below for reference.    Significant Diagnostic Studies: Dg Chest 2 View  01/01/2014   CLINICAL DATA:  Short of breath upon exertion  EXAM: CHEST  2 VIEW  COMPARISON:  DG CHEST 2 VIEW dated 12/22/2013  FINDINGS: Sternotomy wires overlie enlarged cardiac silhouette. There is mild increase in fine airspace disease in the left and right lung. Bilateral small effusions are present. The right effusion is larger but not changed from prior. No pneumothorax.  IMPRESSION: Congestive heart failure pattern which is mildly worsened from prior. .   Electronically Signed   By: Genevive BiStewart  Edmunds M.D.   On: 01/01/2014 18:11   Dg Chest 2 View  12/22/2013   CLINICAL DATA Fatigue. Shortness of breath. Current history of diabetes and hypertension. Prior CABG.  EXAM CHEST  2 VIEW  COMPARISON DG CHEST 2V dated 11/24/2013; DG CHEST 2V dated 08/27/2013; DG CHEST 2 VIEW dated 08/17/2013; CT CHEST W/O CM dated 02/17/2013; DG CHEST 2V dated 07/28/2012  FINDINGS Cardiac silhouette moderately to markedly enlarged but stable.  Thoracic aorta atherosclerotic, unchanged. Hilar and mediastinal contours otherwise unremarkable. Diffuse interstitial and airspace pulmonary edema, superimposed upon COPD/emphysema. Nodular scarring in the right lung apex, unchanged. Moderately large right pleural effusion and associated consolidation in the right  lower lobe. No visible left pleural effusion. Degenerative changes involving the thoracic spine.  IMPRESSION Mild CHF superimposed upon COPD/emphysema. Moderate size right pleural effusion and associated passive atelectasis in the right lower lobe.  SIGNATURE  Electronically Signed   By: Hulan Saas M.D.   On: 12/22/2013 19:46   Ct Head Wo Contrast  01/05/2014   CLINICAL DATA:  Decreased mental status.  Left hand numbness.  EXAM: CT HEAD WITHOUT CONTRAST  TECHNIQUE: Contiguous axial images were obtained from the base of the skull through the vertex without intravenous contrast.  COMPARISON:  MR HEAD W/O CM dated 01/03/2014; DG ANG/EXT/UNI/OR*L* dated 06/26/2011  FINDINGS: There is no evidence of acute intracranial hemorrhage, mass lesion, brain edema or extra-axial fluid collection. The ventricles and subarachnoid spaces are appropriately sized for age. There is no CT evidence of acute cortical infarction. Mild periventricular white matter disease appears stable. There diffuse intracranial vascular calcifications.  The visualized paranasal sinuses, mastoid air cells and middle ears are clear. The calvarium is intact.  IMPRESSION: Stable appearance of the brain.  No acute intracranial findings.   Electronically Signed   By: Roxy Horseman M.D.   On: 01/05/2014 12:58   Mr Brain Wo Contrast  01/06/2014   CLINICAL DATA:  Evaluate for stroke. Decreased mental status. Left hand numbness.  EXAM: MRI HEAD WITHOUT CONTRAST  TECHNIQUE: Multiplanar, multiecho pulse sequences of the brain and surrounding structures were obtained without intravenous contrast.  COMPARISON:  CT HEAD W/O CM dated 01/05/2014; MR  HEAD W/O CM dated 01/03/2014  FINDINGS: No evidence for acute infarction, hemorrhage, mass lesion, hydrocephalus, or extra-axial fluid. Unchanged atrophy with small vessel disease.  No changes suggestive of posterior reversible encephalopathy syndrome.  Post infusion, no abnormal enhancement of the brain or meninges. Low T1 signal intensity bone marrow due to renal disease. Mild pannus. No acute sinus, orbital, or mastoid disease.  IMPRESSION: No acute stroke or changes suggestive of hypertensive encephalopathy.  Atrophy and small vessel disease, stable.   Electronically Signed   By: Davonna Belling M.D.   On: 01/06/2014 21:17   Mr Brain Wo Contrast  01/03/2014   CLINICAL DATA:  Left arm numbness. Evaluate for stroke versus related to recent surgical procedure.  EXAM: MRI HEAD WITHOUT CONTRAST  TECHNIQUE: Multiplanar, multiecho pulse sequences of the brain and surrounding structures were obtained without intravenous contrast.  COMPARISON:  None.  FINDINGS: The patient was unable to remain motionless for the exam. Small or subtle lesions could be overlooked.  No acute stroke is evident. There is generalized atrophy with moderate chronic microvascular ischemic change. Remote right pontine and right basal ganglia lacunes are observed. Diffuse marrow signal abnormality consistent with chronic anemia/chronic renal disease. Cervical spondylosis. Left C1-C2 joint effusion, likely degenerative. Mild pannus.  IMPRESSION: Atrophy.  Chronic microvascular ischemic change.  Remote areas of ischemia without visible acute infarction. No underlying abnormality is seen which might contribute to left arm numbness.   Electronically Signed   By: Davonna Belling M.D.   On: 01/03/2014 17:57   Dg Chest Port 1 View  01/09/2014   CLINICAL DATA:  Central line placement  EXAM: PORTABLE CHEST - 1 VIEW  COMPARISON:  01/01/2014  FINDINGS: Dual-lumen central line, tip at the level of the upper right atrium. No evidence of pneumothorax.  Unchanged  cardiopericardial enlargement. Status post CABG. Worsening diffuse interstitial opacity. There are small layering pleural effusions, right more than left.  IMPRESSION: 1. Right IJ central line with the lowest catheter tip in  the upper right atrium. No pneumothorax. 2. Worsening CHF.   Electronically Signed   By: Tiburcio Pea M.D.   On: 01/09/2014 02:43   Dg Foot Complete Right  12/22/2013   CLINICAL DATA Pain and swelling involving the medial right foot and the right great toe. Current history of diabetes.  EXAM RIGHT FOOT COMPLETE - 3+ VIEW  COMPARISON None.  FINDINGS No evidence of acute fracture or dislocation. Mild joint space narrowing involving the IP joints of multiple toes. Remaining joint spaces well preserved for age. Mild osseous demineralization. Very small enthesopathic spur at the insertion of the Achilles tendon on the posterior calcaneus. No other intrinsic osseous abnormalities. Extensive calcification involving the arteries of the foot.  IMPRESSION No acute osseous abnormality. 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  SIGNATURE  Electronically Signed   By: Hulan Saas M.D.   On: 12/22/2013 19:48   Dg Fluoro Guide Cv Line-no Report  01/09/2014   CLINICAL DATA: check for diatek placement   FLOURO GUIDE CV LINE  Fluoroscopy was utilized by the requesting physician.  No radiographic  interpretation.      Microbiology: Recent Results (from the past 240 hour(s))  SURGICAL PCR SCREEN     Status: None   Collection Time    01/09/14 12:18 AM      Result Value Ref Range Status   MRSA, PCR NEGATIVE  NEGATIVE Final   Staphylococcus aureus NEGATIVE  NEGATIVE Final   Comment:            The Xpert SA Assay (FDA     approved for NASAL specimens     in patients over 37 years of age),     is one component of     a comprehensive surveillance     program.  Test performance has     been validated by [employer] for  patients greater     than or equal to 25 year old.     It is not intended     to diagnose infection nor to     guide or monitor treatment.     Labs: Basic Metabolic Panel:  Recent Labs Lab 01/11/14 0500 01/12/14 0400 01/13/14 0500 01/14/14 0430 01/15/14 0517  NA 142 139 141 141 141  K 3.6* 3.9 4.1 3.4* 4.2  CL 99 97 99 98 97  CO2 25 24 25 26 27   GLUCOSE 157* 187* 211* 128* 298*  BUN 38* 18 29* 17 35*  CREATININE 4.46* 2.58* 3.60* 2.69* 3.99*  CALCIUM 8.8 8.9 9.0 9.0 8.5  PHOS 4.5 2.3 3.8 3.3 3.6   Liver Function Tests:  Recent Labs Lab 01/11/14 0500 01/12/14 0400 01/13/14 0500 01/14/14 0430 01/15/14 0517  ALBUMIN 1.6* 1.8* 1.7* 1.7* 1.7*   No results found for this basename: LIPASE, AMYLASE,  in the last 168 hours No results found for this basename: AMMONIA,  in the last 168 hours CBC:  Recent Labs Lab 01/10/14 0432 01/11/14 0500 01/12/14 0400 01/13/14 1038 01/14/14 0430  WBC 17.2* 16.2* 15.5* 14.7* 13.0*  HGB 9.8* 10.0* 10.0* 8.8* 8.8*  HCT 30.4* 31.3* 31.2* 28.7* 28.2*  MCV 83.7 85.1 84.3 86.4 86.2  PLT 231 201 203 214 225   Cardiac Enzymes: No results found for this basename: CKTOTAL, CKMB, CKMBINDEX, TROPONINI,  in the last 168 hours BNP: No components found with this basename: POCBNP,  CBG:  Recent Labs Lab 01/14/14 0604 01/14/14 1130 01/14/14  1617 01/14/14 2054 01/15/14 0646  GLUCAP 124* 282* 298* 382* 263*    Time coordinating discharge:  Greater than 30 minutes  Signed:  Toluwani Yadav, DO [facility] Pager: [phone] 01/15/2014, 11:13 AM

## 2014-01-15 NOTE — Progress Notes (Signed)
Patient stated she wanted to get out of bed. Informed patient that she is to go to dialysis at noon. Pain medication given per order. Patient bathed x2 NT. Gown and linens changed. Call bell near.Mamie Levers

## 2014-01-15 NOTE — Progress Notes (Signed)
Patient is medically stable for D/C to [facility] today. Per [employer] weekend supervisor at facility patient's daughter is there completing paper work and therefore patient can come anytime this evening. Patient is transferring this evening to facility due to being in dialysis today. Clinical Child psychotherapist (CSW) prepared D/C packet and left number for RN to call [facility] for transport. Patient's daughter Silvio Pate is aware of above. Nursing is aware of above. Please reconsult if further social work needs arise. CSW signing off.   Hendricks Milo Weekend CSW [phone]   Weekend [facility] # [phone].

## 2014-01-15 NOTE — Progress Notes (Signed)
Pt discharged to [facility] transported by [facility]. Pt vital signs stable and assessment unchanged. Pt took personal belonging bag. Discharge packet and prescriptions given to transporters. Pt unable to transport walker with [facility]. RN informed pts daughter that walker is at front desk. Pts daughter verbalized understanding and notified RN she would be by tomorrow after church to pick walker up.   Chesley Mires R

## 2014-01-23 ENCOUNTER — Inpatient Hospital Stay (HOSPITAL_COMMUNITY): Payer: Medicare Other

## 2014-01-23 ENCOUNTER — Encounter (HOSPITAL_COMMUNITY): Payer: Self-pay | Admitting: Emergency Medicine

## 2014-01-23 ENCOUNTER — Emergency Department (HOSPITAL_COMMUNITY): Payer: Medicare Other

## 2014-01-23 ENCOUNTER — Inpatient Hospital Stay (HOSPITAL_COMMUNITY)
Admission: EM | Admit: 2014-01-23 | Discharge: 2014-02-02 | DRG: 264 | Disposition: A | Discharge status: Skilled Nursing Facility | Payer: Medicare Other | Attending: Internal Medicine | Admitting: Internal Medicine

## 2014-01-23 DIAGNOSIS — R0600 Dyspnea, unspecified: Secondary | ICD-10-CM

## 2014-01-23 DIAGNOSIS — L89159 Pressure ulcer of sacral region, unspecified stage: Secondary | ICD-10-CM

## 2014-01-23 DIAGNOSIS — E440 Moderate protein-calorie malnutrition: Secondary | ICD-10-CM | POA: Diagnosis present

## 2014-01-23 DIAGNOSIS — S78119A Complete traumatic amputation at level between unspecified hip and knee, initial encounter: Secondary | ICD-10-CM

## 2014-01-23 DIAGNOSIS — N184 Chronic kidney disease, stage 4 (severe): Secondary | ICD-10-CM

## 2014-01-23 DIAGNOSIS — E11349 Type 2 diabetes mellitus with severe nonproliferative diabetic retinopathy without macular edema: Secondary | ICD-10-CM | POA: Diagnosis present

## 2014-01-23 DIAGNOSIS — E1159 Type 2 diabetes mellitus with other circulatory complications: Secondary | ICD-10-CM | POA: Diagnosis present

## 2014-01-23 DIAGNOSIS — J4489 Other specified chronic obstructive pulmonary disease: Secondary | ICD-10-CM | POA: Diagnosis present

## 2014-01-23 DIAGNOSIS — N186 End stage renal disease: Secondary | ICD-10-CM | POA: Diagnosis present

## 2014-01-23 DIAGNOSIS — E1165 Type 2 diabetes mellitus with hyperglycemia: Secondary | ICD-10-CM | POA: Diagnosis present

## 2014-01-23 DIAGNOSIS — I2510 Atherosclerotic heart disease of native coronary artery without angina pectoris: Secondary | ICD-10-CM | POA: Diagnosis present

## 2014-01-23 DIAGNOSIS — I509 Heart failure, unspecified: Secondary | ICD-10-CM | POA: Diagnosis present

## 2014-01-23 DIAGNOSIS — R9439 Abnormal result of other cardiovascular function study: Secondary | ICD-10-CM

## 2014-01-23 DIAGNOSIS — Z66 Do not resuscitate: Secondary | ICD-10-CM | POA: Diagnosis present

## 2014-01-23 DIAGNOSIS — Z79899 Other long term (current) drug therapy: Secondary | ICD-10-CM

## 2014-01-23 DIAGNOSIS — I70269 Atherosclerosis of native arteries of extremities with gangrene, unspecified extremity: Secondary | ICD-10-CM | POA: Diagnosis present

## 2014-01-23 DIAGNOSIS — L97109 Non-pressure chronic ulcer of unspecified thigh with unspecified severity: Secondary | ICD-10-CM | POA: Diagnosis present

## 2014-01-23 DIAGNOSIS — E785 Hyperlipidemia, unspecified: Secondary | ICD-10-CM | POA: Diagnosis present

## 2014-01-23 DIAGNOSIS — I498 Other specified cardiac arrhythmias: Secondary | ICD-10-CM | POA: Diagnosis present

## 2014-01-23 DIAGNOSIS — I4719 Other supraventricular tachycardia: Secondary | ICD-10-CM | POA: Diagnosis present

## 2014-01-23 DIAGNOSIS — N058 Unspecified nephritic syndrome with other morphologic changes: Secondary | ICD-10-CM | POA: Diagnosis present

## 2014-01-23 DIAGNOSIS — Z86718 Personal history of other venous thrombosis and embolism: Secondary | ICD-10-CM

## 2014-01-23 DIAGNOSIS — E43 Unspecified severe protein-calorie malnutrition: Secondary | ICD-10-CM | POA: Insufficient documentation

## 2014-01-23 DIAGNOSIS — Z7902 Long term (current) use of antithrombotics/antiplatelets: Secondary | ICD-10-CM

## 2014-01-23 DIAGNOSIS — D649 Anemia, unspecified: Secondary | ICD-10-CM | POA: Diagnosis present

## 2014-01-23 DIAGNOSIS — Z886 Allergy status to analgesic agent status: Secondary | ICD-10-CM

## 2014-01-23 DIAGNOSIS — L8995 Pressure ulcer of unspecified site, unstageable: Secondary | ICD-10-CM | POA: Diagnosis present

## 2014-01-23 DIAGNOSIS — E113599 Type 2 diabetes mellitus with proliferative diabetic retinopathy without macular edema, unspecified eye: Secondary | ICD-10-CM | POA: Diagnosis present

## 2014-01-23 DIAGNOSIS — I120 Hypertensive chronic kidney disease with stage 5 chronic kidney disease or end stage renal disease: Secondary | ICD-10-CM | POA: Diagnosis present

## 2014-01-23 DIAGNOSIS — E1139 Type 2 diabetes mellitus with other diabetic ophthalmic complication: Secondary | ICD-10-CM | POA: Diagnosis present

## 2014-01-23 DIAGNOSIS — L89309 Pressure ulcer of unspecified buttock, unspecified stage: Secondary | ICD-10-CM | POA: Diagnosis present

## 2014-01-23 DIAGNOSIS — I739 Peripheral vascular disease, unspecified: Secondary | ICD-10-CM | POA: Diagnosis present

## 2014-01-23 DIAGNOSIS — Z794 Long term (current) use of insulin: Secondary | ICD-10-CM

## 2014-01-23 DIAGNOSIS — G4700 Insomnia, unspecified: Secondary | ICD-10-CM | POA: Diagnosis present

## 2014-01-23 DIAGNOSIS — M899 Disorder of bone, unspecified: Secondary | ICD-10-CM | POA: Diagnosis present

## 2014-01-23 DIAGNOSIS — I5023 Acute on chronic systolic (congestive) heart failure: Secondary | ICD-10-CM

## 2014-01-23 DIAGNOSIS — M949 Disorder of cartilage, unspecified: Secondary | ICD-10-CM

## 2014-01-23 DIAGNOSIS — I428 Other cardiomyopathies: Secondary | ICD-10-CM | POA: Diagnosis present

## 2014-01-23 DIAGNOSIS — Z91041 Radiographic dye allergy status: Secondary | ICD-10-CM

## 2014-01-23 DIAGNOSIS — E1169 Type 2 diabetes mellitus with other specified complication: Secondary | ICD-10-CM

## 2014-01-23 DIAGNOSIS — R627 Adult failure to thrive: Secondary | ICD-10-CM | POA: Diagnosis present

## 2014-01-23 DIAGNOSIS — Z87891 Personal history of nicotine dependence: Secondary | ICD-10-CM

## 2014-01-23 DIAGNOSIS — E1129 Type 2 diabetes mellitus with other diabetic kidney complication: Secondary | ICD-10-CM

## 2014-01-23 DIAGNOSIS — L97509 Non-pressure chronic ulcer of other part of unspecified foot with unspecified severity: Secondary | ICD-10-CM | POA: Diagnosis present

## 2014-01-23 DIAGNOSIS — IMO0002 Reserved for concepts with insufficient information to code with codable children: Secondary | ICD-10-CM | POA: Diagnosis present

## 2014-01-23 DIAGNOSIS — J449 Chronic obstructive pulmonary disease, unspecified: Secondary | ICD-10-CM

## 2014-01-23 DIAGNOSIS — I10 Essential (primary) hypertension: Secondary | ICD-10-CM

## 2014-01-23 DIAGNOSIS — D631 Anemia in chronic kidney disease: Secondary | ICD-10-CM | POA: Diagnosis present

## 2014-01-23 DIAGNOSIS — I5022 Chronic systolic (congestive) heart failure: Secondary | ICD-10-CM | POA: Diagnosis present

## 2014-01-23 DIAGNOSIS — I96 Gangrene, not elsewhere classified: Secondary | ICD-10-CM | POA: Diagnosis present

## 2014-01-23 DIAGNOSIS — B9689 Other specified bacterial agents as the cause of diseases classified elsewhere: Secondary | ICD-10-CM | POA: Diagnosis present

## 2014-01-23 DIAGNOSIS — I471 Supraventricular tachycardia: Secondary | ICD-10-CM | POA: Diagnosis present

## 2014-01-23 DIAGNOSIS — N2581 Secondary hyperparathyroidism of renal origin: Secondary | ICD-10-CM | POA: Diagnosis present

## 2014-01-23 DIAGNOSIS — Z515 Encounter for palliative care: Secondary | ICD-10-CM

## 2014-01-23 DIAGNOSIS — Z951 Presence of aortocoronary bypass graft: Secondary | ICD-10-CM

## 2014-01-23 DIAGNOSIS — L8993 Pressure ulcer of unspecified site, stage 3: Secondary | ICD-10-CM | POA: Diagnosis present

## 2014-01-23 DIAGNOSIS — E11359 Type 2 diabetes mellitus with proliferative diabetic retinopathy without macular edema: Secondary | ICD-10-CM | POA: Diagnosis present

## 2014-01-23 DIAGNOSIS — Z993 Dependence on wheelchair: Secondary | ICD-10-CM

## 2014-01-23 DIAGNOSIS — R079 Chest pain, unspecified: Secondary | ICD-10-CM

## 2014-01-23 DIAGNOSIS — H543 Unqualified visual loss, both eyes: Secondary | ICD-10-CM | POA: Diagnosis present

## 2014-01-23 DIAGNOSIS — R0789 Other chest pain: Principal | ICD-10-CM | POA: Diagnosis present

## 2014-01-23 DIAGNOSIS — H547 Unspecified visual loss: Secondary | ICD-10-CM | POA: Diagnosis present

## 2014-01-23 DIAGNOSIS — Z992 Dependence on renal dialysis: Secondary | ICD-10-CM

## 2014-01-23 DIAGNOSIS — N039 Chronic nephritic syndrome with unspecified morphologic changes: Secondary | ICD-10-CM

## 2014-01-23 DIAGNOSIS — L89109 Pressure ulcer of unspecified part of back, unspecified stage: Secondary | ICD-10-CM | POA: Diagnosis present

## 2014-01-23 DIAGNOSIS — N390 Urinary tract infection, site not specified: Secondary | ICD-10-CM | POA: Diagnosis present

## 2014-01-23 DIAGNOSIS — Z9851 Tubal ligation status: Secondary | ICD-10-CM

## 2014-01-23 DIAGNOSIS — R072 Precordial pain: Secondary | ICD-10-CM | POA: Diagnosis present

## 2014-01-23 HISTORY — DX: Dependence on renal dialysis: Z99.2

## 2014-01-23 HISTORY — DX: End stage renal disease: N18.6

## 2014-01-23 LAB — MRSA PCR SCREENING: MRSA by PCR: NEGATIVE

## 2014-01-23 LAB — URINE MICROSCOPIC-ADD ON

## 2014-01-23 LAB — URINALYSIS, ROUTINE W REFLEX MICROSCOPIC
BILIRUBIN URINE: NEGATIVE
Glucose, UA: NEGATIVE mg/dL
KETONES UR: NEGATIVE mg/dL
NITRITE: NEGATIVE
Protein, ur: >300 mg/dL — AB
Specific Gravity, Urine: 1.021 (ref 1.005–1.030)
UROBILINOGEN UA: 0.2 mg/dL (ref 0.0–1.0)
pH: 6 (ref 5.0–8.0)

## 2014-01-23 LAB — CBC WITH DIFFERENTIAL/PLATELET
Basophils Absolute: 0 10*3/uL (ref 0.0–0.1)
Basophils Relative: 0 % (ref 0–1)
Eosinophils Absolute: 0.2 10*3/uL (ref 0.0–0.7)
Eosinophils Relative: 2 % (ref 0–5)
HCT: 36 % (ref 36.0–46.0)
Hemoglobin: 10.9 g/dL — ABNORMAL LOW (ref 12.0–15.0)
LYMPHS ABS: 1.1 10*3/uL (ref 0.7–4.0)
LYMPHS PCT: 10 % — AB (ref 12–46)
MCH: 26.5 pg (ref 26.0–34.0)
MCHC: 30.3 g/dL (ref 30.0–36.0)
MCV: 87.4 fL (ref 78.0–100.0)
Monocytes Absolute: 0.6 10*3/uL (ref 0.1–1.0)
Monocytes Relative: 6 % (ref 3–12)
Neutro Abs: 8.8 10*3/uL — ABNORMAL HIGH (ref 1.7–7.7)
Neutrophils Relative %: 82 % — ABNORMAL HIGH (ref 43–77)
PLATELETS: 430 10*3/uL — AB (ref 150–400)
RBC: 4.12 MIL/uL (ref 3.87–5.11)
RDW: 18 % — ABNORMAL HIGH (ref 11.5–15.5)
WBC: 10.7 10*3/uL — AB (ref 4.0–10.5)

## 2014-01-23 LAB — COMPREHENSIVE METABOLIC PANEL
ALK PHOS: 134 U/L — AB (ref 39–117)
ALT: 15 U/L (ref 0–35)
AST: 32 U/L (ref 0–37)
Albumin: 2.2 g/dL — ABNORMAL LOW (ref 3.5–5.2)
BUN: 21 mg/dL (ref 6–23)
CHLORIDE: 92 meq/L — AB (ref 96–112)
CO2: 26 mEq/L (ref 19–32)
Calcium: 10 mg/dL (ref 8.4–10.5)
Creatinine, Ser: 3.73 mg/dL — ABNORMAL HIGH (ref 0.50–1.10)
GFR calc non Af Amer: 11 mL/min — ABNORMAL LOW (ref >90)
GFR, EST AFRICAN AMERICAN: 13 mL/min — AB (ref >90)
GLUCOSE: 208 mg/dL — AB (ref 70–99)
Potassium: 4.1 mEq/L (ref 3.7–5.3)
Sodium: 137 mEq/L (ref 137–147)
Total Bilirubin: 0.4 mg/dL (ref 0.3–1.2)
Total Protein: 7.1 g/dL (ref 6.0–8.3)

## 2014-01-23 LAB — CBC
HEMATOCRIT: 32.8 % — AB (ref 36.0–46.0)
Hemoglobin: 10.2 g/dL — ABNORMAL LOW (ref 12.0–15.0)
MCH: 26.6 pg (ref 26.0–34.0)
MCHC: 31.1 g/dL (ref 30.0–36.0)
MCV: 85.4 fL (ref 78.0–100.0)
PLATELETS: 403 10*3/uL — AB (ref 150–400)
RBC: 3.84 MIL/uL — ABNORMAL LOW (ref 3.87–5.11)
RDW: 17.7 % — ABNORMAL HIGH (ref 11.5–15.5)
WBC: 10.6 10*3/uL — ABNORMAL HIGH (ref 4.0–10.5)

## 2014-01-23 LAB — D-DIMER, QUANTITATIVE (NOT AT ARMC): D DIMER QUANT: 1.84 ug{FEU}/mL — AB (ref 0.00–0.48)

## 2014-01-23 LAB — SEDIMENTATION RATE: Sed Rate: 41 mm/hr — ABNORMAL HIGH (ref 0–22)

## 2014-01-23 LAB — I-STAT TROPONIN, ED: Troponin i, poc: 0.05 ng/mL (ref 0.00–0.08)

## 2014-01-23 LAB — PRO B NATRIURETIC PEPTIDE

## 2014-01-23 LAB — CBG MONITORING, ED: Glucose-Capillary: 228 mg/dL — ABNORMAL HIGH (ref 70–99)

## 2014-01-23 LAB — TROPONIN I

## 2014-01-23 LAB — GLUCOSE, CAPILLARY: Glucose-Capillary: 211 mg/dL — ABNORMAL HIGH (ref 70–99)

## 2014-01-23 MED ORDER — DIPHENHYDRAMINE HCL 50 MG/ML IJ SOLN
50.0000 mg | Freq: Once | INTRAMUSCULAR | Status: AC
  Administered 2014-01-23: 50 mg via INTRAVENOUS
  Filled 2014-01-23: qty 1

## 2014-01-23 MED ORDER — ISOSORBIDE MONONITRATE 15 MG HALF TABLET
15.0000 mg | ORAL_TABLET | Freq: Every day | ORAL | Status: DC
  Filled 2014-01-23: qty 1

## 2014-01-23 MED ORDER — SODIUM CHLORIDE 0.9 % IV SOLN
250.0000 mL | INTRAVENOUS | Status: DC | PRN
  Administered 2014-01-23: 250 mL via INTRAVENOUS

## 2014-01-23 MED ORDER — FOLIC ACID 1 MG PO TABS
1.0000 mg | ORAL_TABLET | Freq: Every day | ORAL | Status: DC
  Administered 2014-01-24 – 2014-02-02 (×9): 1 mg via ORAL
  Filled 2014-01-23 (×10): qty 1

## 2014-01-23 MED ORDER — PANTOPRAZOLE SODIUM 40 MG PO TBEC
40.0000 mg | DELAYED_RELEASE_TABLET | Freq: Every day | ORAL | Status: DC
  Administered 2014-01-24 – 2014-02-02 (×9): 40 mg via ORAL
  Filled 2014-01-23 (×8): qty 1

## 2014-01-23 MED ORDER — NITROGLYCERIN 0.4 MG SL SUBL: 0.4000 mg | SUBLINGUAL_TABLET | SUBLINGUAL | Status: DC | PRN

## 2014-01-23 MED ORDER — ONDANSETRON HCL 4 MG PO TABS: 4.0000 mg | ORAL_TABLET | Freq: Four times a day (QID) | ORAL | Status: DC | PRN

## 2014-01-23 MED ORDER — VANCOMYCIN HCL IN DEXTROSE 1-5 GM/200ML-% IV SOLN
1000.0000 mg | Freq: Once | INTRAVENOUS | Status: AC
  Administered 2014-01-23: 1000 mg via INTRAVENOUS
  Filled 2014-01-23: qty 200

## 2014-01-23 MED ORDER — ACETAMINOPHEN 650 MG RE SUPP: 650.0000 mg | Freq: Four times a day (QID) | RECTAL | Status: DC | PRN

## 2014-01-23 MED ORDER — HEPARIN SODIUM (PORCINE) 5000 UNIT/ML IJ SOLN
5000.0000 [IU] | Freq: Three times a day (TID) | INTRAMUSCULAR | Status: DC
  Filled 2014-01-23 (×2): qty 1

## 2014-01-23 MED ORDER — CARVEDILOL 12.5 MG PO TABS
12.5000 mg | ORAL_TABLET | Freq: Two times a day (BID) | ORAL | Status: DC
  Administered 2014-01-24 – 2014-02-01 (×13): 12.5 mg via ORAL
  Filled 2014-01-23 (×22): qty 1

## 2014-01-23 MED ORDER — VANCOMYCIN HCL 10 G IV SOLR
1000.0000 mg | Freq: Once | INTRAVENOUS | Status: DC
  Filled 2014-01-23: qty 1000

## 2014-01-23 MED ORDER — ALUM & MAG HYDROXIDE-SIMETH 200-200-20 MG/5ML PO SUSP: 30.0000 mL | Freq: Four times a day (QID) | ORAL | Status: DC | PRN

## 2014-01-23 MED ORDER — VITAMIN C 500 MG PO TABS
500.0000 mg | ORAL_TABLET | Freq: Every day | ORAL | Status: DC
  Administered 2014-01-24 – 2014-02-02 (×9): 500 mg via ORAL
  Filled 2014-01-23 (×10): qty 1

## 2014-01-23 MED ORDER — INSULIN GLARGINE 100 UNIT/ML ~~LOC~~ SOLN
10.0000 [IU] | Freq: Every day | SUBCUTANEOUS | Status: DC
  Administered 2014-01-23 – 2014-01-24 (×2): 10 [IU] via SUBCUTANEOUS
  Filled 2014-01-23 (×3): qty 0.1

## 2014-01-23 MED ORDER — ONDANSETRON HCL 4 MG/2ML IJ SOLN: 4.0000 mg | Freq: Four times a day (QID) | INTRAMUSCULAR | Status: DC | PRN

## 2014-01-23 MED ORDER — TECHNETIUM TO 99M ALBUMIN AGGREGATED
6.0000 | Freq: Once | INTRAVENOUS | Status: AC | PRN
  Administered 2014-01-23: 6 via INTRAVENOUS

## 2014-01-23 MED ORDER — GUAIFENESIN-DM 100-10 MG/5ML PO SYRP
5.0000 mL | ORAL_SOLUTION | ORAL | Status: DC | PRN
  Filled 2014-01-23: qty 5

## 2014-01-23 MED ORDER — HEPARIN (PORCINE) IN NACL 100-0.45 UNIT/ML-% IJ SOLN
900.0000 [IU]/h | INTRAMUSCULAR | Status: DC
  Administered 2014-01-23: 700 [IU]/h via INTRAVENOUS
  Filled 2014-01-23: qty 250

## 2014-01-23 MED ORDER — SODIUM CHLORIDE 0.9 % IJ SOLN
3.0000 mL | Freq: Two times a day (BID) | INTRAMUSCULAR | Status: DC
  Administered 2014-01-23 – 2014-01-30 (×15): 3 mL via INTRAVENOUS

## 2014-01-23 MED ORDER — OMEGA-3-ACID ETHYL ESTERS 1 G PO CAPS
1.0000 g | ORAL_CAPSULE | Freq: Four times a day (QID) | ORAL | Status: DC
  Administered 2014-01-23 – 2014-02-02 (×30): 1 g via ORAL
  Filled 2014-01-23 (×41): qty 1

## 2014-01-23 MED ORDER — GI COCKTAIL ~~LOC~~
30.0000 mL | Freq: Once | ORAL | Status: AC
  Administered 2014-01-23: 30 mL via ORAL
  Filled 2014-01-23: qty 30

## 2014-01-23 MED ORDER — OXYCODONE HCL 5 MG PO TABS
5.0000 mg | ORAL_TABLET | ORAL | Status: DC | PRN
  Administered 2014-01-24 – 2014-01-26 (×7): 5 mg via ORAL
  Filled 2014-01-23 (×8): qty 1

## 2014-01-23 MED ORDER — NEPRO/CARBSTEADY PO LIQD
237.0000 mL | Freq: Three times a day (TID) | ORAL | Status: DC
  Administered 2014-01-24 – 2014-02-02 (×19): 237 mL via ORAL
  Filled 2014-01-23 (×37): qty 237

## 2014-01-23 MED ORDER — AMLODIPINE BESYLATE 10 MG PO TABS
10.0000 mg | ORAL_TABLET | Freq: Every day | ORAL | Status: DC
  Administered 2014-01-24 – 2014-02-01 (×6): 10 mg via ORAL
  Filled 2014-01-23 (×9): qty 1

## 2014-01-23 MED ORDER — DIPHENHYDRAMINE HCL 25 MG PO TABS
25.0000 mg | ORAL_TABLET | Freq: Four times a day (QID) | ORAL | Status: DC | PRN
  Filled 2014-01-23: qty 1

## 2014-01-23 MED ORDER — CALCITRIOL 0.5 MCG PO CAPS
0.5000 ug | ORAL_CAPSULE | Freq: Every day | ORAL | Status: DC
  Administered 2014-01-24 – 2014-01-25 (×2): 0.5 ug via ORAL
  Filled 2014-01-23 (×3): qty 1

## 2014-01-23 MED ORDER — ATORVASTATIN CALCIUM 40 MG PO TABS
40.0000 mg | ORAL_TABLET | Freq: Every day | ORAL | Status: DC
  Administered 2014-01-24 – 2014-02-02 (×9): 40 mg via ORAL
  Filled 2014-01-23 (×10): qty 1

## 2014-01-23 MED ORDER — TECHNETIUM TC 99M DIETHYLENETRIAME-PENTAACETIC ACID: 40.0000 | Freq: Once | INTRAVENOUS | Status: AC | PRN

## 2014-01-23 MED ORDER — FAMOTIDINE 20 MG PO TABS
20.0000 mg | ORAL_TABLET | Freq: Once | ORAL | Status: AC
  Administered 2014-01-23: 20 mg via ORAL
  Filled 2014-01-23: qty 1

## 2014-01-23 MED ORDER — ALBUTEROL SULFATE (2.5 MG/3ML) 0.083% IN NEBU: 2.5000 mg | INHALATION_SOLUTION | RESPIRATORY_TRACT | Status: DC | PRN

## 2014-01-23 MED ORDER — INSULIN ASPART 100 UNIT/ML ~~LOC~~ SOLN
0.0000 [IU] | Freq: Three times a day (TID) | SUBCUTANEOUS | Status: DC
  Administered 2014-01-24: 2 [IU] via SUBCUTANEOUS
  Administered 2014-01-24: 3 [IU] via SUBCUTANEOUS
  Administered 2014-01-25 – 2014-01-30 (×3): 1 [IU] via SUBCUTANEOUS
  Administered 2014-01-30: 2 [IU] via SUBCUTANEOUS
  Administered 2014-01-31: 5 [IU] via SUBCUTANEOUS
  Administered 2014-02-01 (×2): 2 [IU] via SUBCUTANEOUS
  Administered 2014-02-01: 1 [IU] via SUBCUTANEOUS

## 2014-01-23 MED ORDER — FERROUS SULFATE 325 (65 FE) MG PO TABS
325.0000 mg | ORAL_TABLET | Freq: Two times a day (BID) | ORAL | Status: DC
  Administered 2014-01-23 – 2014-01-25 (×5): 325 mg via ORAL
  Filled 2014-01-23 (×7): qty 1

## 2014-01-23 MED ORDER — SODIUM CHLORIDE 0.9 % IV SOLN
500.0000 mg | INTRAVENOUS | Status: DC
  Filled 2014-01-23: qty 500

## 2014-01-23 MED ORDER — OXYCODONE-ACETAMINOPHEN 5-325 MG PO TABS
1.0000 | ORAL_TABLET | Freq: Once | ORAL | Status: AC
  Administered 2014-01-23: 1 via ORAL
  Filled 2014-01-23: qty 1

## 2014-01-23 MED ORDER — LANTHANUM CARBONATE 500 MG PO CHEW
500.0000 mg | CHEWABLE_TABLET | Freq: Three times a day (TID) | ORAL | Status: DC
  Administered 2014-01-24 – 2014-01-25 (×5): 500 mg via ORAL
  Filled 2014-01-23 (×10): qty 1

## 2014-01-23 MED ORDER — SODIUM CHLORIDE 0.9 % IJ SOLN: 3.0000 mL | INTRAMUSCULAR | Status: DC | PRN

## 2014-01-23 MED ORDER — MIRTAZAPINE 7.5 MG PO TABS
7.5000 mg | ORAL_TABLET | Freq: Every day | ORAL | Status: DC
  Administered 2014-01-23 – 2014-02-01 (×9): 7.5 mg via ORAL
  Filled 2014-01-23 (×11): qty 1

## 2014-01-23 MED ORDER — CEFTRIAXONE SODIUM 1 G IJ SOLR
1.0000 g | INTRAMUSCULAR | Status: DC
  Administered 2014-01-24: 1 g via INTRAVENOUS
  Filled 2014-01-23 (×2): qty 10

## 2014-01-23 MED ORDER — ZINC SULFATE 220 (50 ZN) MG PO CAPS
220.0000 mg | ORAL_CAPSULE | Freq: Every day | ORAL | Status: DC
  Administered 2014-01-24 – 2014-02-02 (×9): 220 mg via ORAL
  Filled 2014-01-23 (×10): qty 1

## 2014-01-23 MED ORDER — ACETAMINOPHEN 325 MG PO TABS
650.0000 mg | ORAL_TABLET | Freq: Four times a day (QID) | ORAL | Status: DC | PRN
  Administered 2014-01-24 – 2014-01-25 (×3): 650 mg via ORAL
  Filled 2014-01-23 (×3): qty 2

## 2014-01-23 MED ORDER — VANCOMYCIN HCL IN DEXTROSE 750-5 MG/150ML-% IV SOLN: 750.0000 mg | INTRAVENOUS | Status: DC

## 2014-01-23 MED ORDER — DEXTROSE 5 % IV SOLN
1.0000 g | Freq: Once | INTRAVENOUS | Status: AC
  Administered 2014-01-23: 1 g via INTRAVENOUS
  Filled 2014-01-23: qty 10

## 2014-01-23 MED ORDER — HYDROCORTISONE NA SUCCINATE PF 250 MG IJ SOLR
200.0000 mg | Freq: Once | INTRAMUSCULAR | Status: AC
  Administered 2014-01-23: 200 mg via INTRAVENOUS
  Filled 2014-01-23: qty 200

## 2014-01-23 NOTE — Progress Notes (Signed)
ANTICOAGULATION CONSULT NOTE - Initial Consult  Pharmacy Consult:  Heparin Indication:  VTE treatment  Allergies  Allergen Reactions  . Ivp Dye [Iodinated Diagnostic Agents] Hives and Rash  . Aspirin Hives    Patient Measurements: Height: 5\' 3"  (160 cm) Weight: 126 lb 8 oz (57.38 kg) IBW/kg (Calculated) : 52.4 Heparin Dosing Weight: 57 kg  Vital Signs: Temp: 99.3 F (37.4 C) (04/12 2018) Temp src: Oral (04/12 2018) BP: 144/61 mmHg (04/12 2018) Pulse Rate: 79 (04/12 2018)  Labs:  Recent Labs  01/23/14 1139 01/23/14 1627  HGB 10.9*  --   HCT 36.0  --   PLT 430*  --   CREATININE 3.73*  --   TROPONINI  --  <0.30    Estimated Creatinine Clearance: 11.3 ml/min (by C-G formula based on Cr of 3.73).   Medical History: Past Medical History  Diagnosis Date  . CAD (coronary artery disease)   . Hypertension   . Diabetes mellitus     type 2 insulin dependent  . Hyperlipidemia   . CHF (congestive heart failure)   . Cellulitis     left foot  . Ulcer     diabetic ulcer on left foot  . DVT (deep venous thrombosis)     peroneal vein  . Peripheral vascular disease   . Chronic kidney disease   . Anemia   . Shortness of breath     with fluid  . Insomnia       Assessment: 69 YOF with history of DVT recently taken off of anticoagulation.  Pharmacy consulted to start IV heparin for VTE treatment while Doppler and VQ scan are pending.  Baseline labs reviewed.   Goal of Therapy:  Heparin level 0.3-0.5 units/ml per MD request Monitor platelets by anticoagulation protocol: Yes    Plan:  - Heparin gtt at 700 units/hr - Check 8 hr HL - Daily HL / CBC - F/U Doppler and VQ scan results    [name] D. Laney Potash, PharmD, BCPS Pager:  [phone] 01/23/2014, 9:05 PM

## 2014-01-23 NOTE — ED Provider Notes (Signed)
CSN: [id number]     Arrival date & time 01/23/14  1130 History   First MD Initiated Contact with Patient 01/23/14 1142     Chief Complaint  Patient presents with  . Shortness of Breath  . Chest Pain     (Consider location/radiation/quality/duration/timing/severity/associated sxs/prior Treatment) HPI Patient reports about 8:00 she was given her morning medicine and about 30-60 minutes later started getting a chest discomfort that she describes a burning like heartburn. She also has been belching a lot. She points to her lower anterior chest as where the pain is located. It does not go into her abdomen. She denies any burning fluid in her throat however. She states she feels slight short of breath, she has been mildly diaphoretic and she has "sort of" had nausea. Patient states the burning discomfort comes and goes and it lasted 10-15 minutes. She states she's never had it before.  Patient has had right AKA done about 2 weeks ago by Dr. Hart RochesterLawson. She is currently in rehabilitation. Her daughter expresses concern about possible decubitus ulcers. She states she had noticed redness on her mother's bottom and has asked the nursing home to start dressing the area.  She has end-stage renal disease and she gets dialysis on Monday (tomorrow), Wednesday, and Friday and as your.  PCP Dr Juleen ChinaKohut Nephrology Dr Eliott Nineunham [facility] Dr Hart RochesterLawson Cardiologist Dr Rennis GoldenHilty  Past Medical History  Diagnosis Date  . CAD (coronary artery disease)   . Hypertension   . Diabetes mellitus     type 2 insulin dependent  . Hyperlipidemia   . CHF (congestive heart failure)   . Cellulitis     left foot  . Ulcer     diabetic ulcer on left foot  . DVT (deep venous thrombosis)     peroneal vein  . Peripheral vascular disease   . Chronic kidney disease   . Anemia   . Shortness of breath     with fluid  . Insomnia    Past Surgical History  Procedure Laterality Date  . Tubal ligation    . Pr vein bypass  graft,aorto-fem-pop  06-26-11    1. PATENT LEFT FEMORAL-POPLITEAL BYPASS GRAFT W/NO EVEIDENCE OF STENOSIS. 2.VELOCITIES OF GREATER THAN 200 CM/'s NOTED ON PREVIOUS EXAM 10/03/11 WERE NOT ADEQUATELY VISULAIZED DURING THIS EXAM  . Coronary artery bypass graft  04/25/08    Dr. Tyrone SageGerhardt, CABG x 5   . Nm myoview ltd  01/13/13    LEXISCAN; LV WALL MOTION: LVEF 44%, INFERIOR AKINESIS, ANTEROAPICAL HYPOKINESIS  . Cardiac catheterization  04/22/08    SEVERE 3-VESSEL DISEASE CAD., CABG X 5 04/25/08 WITH DR. YQIHKVQQGERHARDT  . Av fistula placement Left 09/07/2013    Procedure: ARTERIOVENOUS (AV) FISTULA CREATION;  Surgeon: Sherren Kernsharles E Fields, MD;  [facility];  Service: Vascular;  Laterality: Left;  . Ligation arteriovenous gortex graft Left 01/09/2014    Procedure: LIGATION ARTERIOVENOUS GORTEX GRAFT;  Surgeon: Sherren Kernsharles E Fields, MD;  [facility];  Service: Vascular;  Laterality: Left;  . Insertion of dialysis catheter Right 01/09/2014    Procedure: INSERTION OF DIALYSIS CATHETER;  Surgeon: Sherren Kernsharles E Fields, MD;  [facility];  Service: Vascular;  Laterality: Right;  Insertion of diatek to right internal Jugular artery.  . Amputation Right 01/12/2014    Procedure: AMPUTATION ABOVE KNEE- RIGHT;  Surgeon: Pryor OchoaJames D Lawson, MD;  [facility];  Service: Vascular;  Laterality: Right;   Family History  Problem Relation Age of Onset  . Cancer Mother  Kidney  . Kidney disease Mother   . Cancer Father     skin   History  Substance Use Topics  . Smoking status: Former Smoker -- 2.00 packs/day for 20 years    Types: Cigarettes    Quit date: 07/15/1996  . Smokeless tobacco: Former Neurosurgeon    Types: Snuff  . Alcohol Use: No   Was living at home, currently in rehab, will go home with daughter when discharged   OB History   Grav Para Term Preterm Abortions TAB SAB Ect Mult Living                 Review of Systems  All other systems reviewed and are negative.     Allergies  Ivp dye and Aspirin  Home  Medications   Current Outpatient Rx  Name  Route  Sig  Dispense  Refill  . acetaminophen (TYLENOL) 650 MG CR tablet   Oral   Take 650 mg by mouth every 8 (eight) hours as needed for pain.         [name] amLODipine (NORVASC) 5 MG tablet   Oral   Take 10 mg by mouth daily.          [name] atorvastatin (LIPITOR) 40 MG tablet   Oral   Take 40 mg by mouth daily.          . calcitRIOL (ROCALTROL) 0.25 MCG capsule   Oral   Take 0.5 mcg by mouth daily.         . carvedilol (COREG) 12.5 MG tablet   Oral   Take 12.5 mg by mouth 2 (two) times daily with a meal.          . diphenhydrAMINE (BENADRYL) 25 MG tablet   Oral   Take 25 mg by mouth every 6 (six) hours as needed for itching.         . ferrous sulfate 325 (65 FE) MG tablet   Oral   Take 325 mg by mouth 2 (two) times daily.          . folic acid (FOLVITE) 1 MG tablet   Oral   Take 1 mg by mouth daily.          . insulin glargine (LANTUS) 100 UNIT/ML injection   Subcutaneous   Inject 0.1 mLs (10 Units total) into the skin at bedtime.   10 mL   0   . isosorbide mononitrate (IMDUR) 30 MG 24 hr tablet   Oral   Take 15 mg by mouth daily.         [name] lanthanum (FOSRENOL) 500 MG chewable tablet   Oral   Chew 1 tablet (500 mg total) by mouth 3 (three) times daily with meals.   90 tablet   0   . mirtazapine (REMERON) 7.5 MG tablet   Oral   Take 7.5 mg by mouth at bedtime.         . nitroGLYCERIN (NITROSTAT) 0.4 MG SL tablet   Sublingual   Place 0.4 mg under the tongue every 5 (five) minutes as needed for chest pain.         . Nutritional Supplements (FEEDING SUPPLEMENT, NEPRO CARB STEADY,) LIQD   Oral   Take 237 mLs by mouth 3 (three) times daily with meals.      0   . omega-3 acid ethyl esters (LOVAZA) 1 G capsule   Oral   Take 1 g by mouth 4 (four) times daily.          [name]  omeprazole (PRILOSEC) 20 MG capsule   Oral   Take 20 mg by mouth daily.          [name] oxyCODONE-acetaminophen (PERCOCET)  10-325 MG per tablet   Oral   Take 1 tablet by mouth 3 (three) times daily as needed for pain.   30 tablet   0   . potassium chloride SA (K-DUR,KLOR-CON) 20 MEQ tablet   Oral   Take 20 mEq by mouth daily.         . vitamin C (ASCORBIC ACID) 500 MG tablet   Oral   Take 500 mg by mouth daily.         [name] zinc sulfate 220 MG capsule   Oral   Take 220 mg by mouth daily.          BP 143/56  Pulse 86  Temp(Src) 98.2 F (36.8 C) (Oral)  Resp 17  SpO2 97%  Vital signs normal   Physical Exam  Nursing note and vitals reviewed. Constitutional: She is oriented to person, place, and time. She appears well-developed and well-nourished.  Non-toxic appearance. She does not appear ill. No distress.  HENT:  Head: Normocephalic and atraumatic.  Right Ear: External ear normal.  Left Ear: External ear normal.  Nose: Nose normal. No mucosal edema or rhinorrhea.  Mouth/Throat: Oropharynx is clear and moist and mucous membranes are normal. No dental abscesses or uvula swelling.  Eyes: Conjunctivae and EOM are normal. Pupils are equal, round, and reactive to light.  Neck: Normal range of motion and full passive range of motion without pain. Neck supple.  Cardiovascular: Normal rate, regular rhythm and normal heart sounds.  Exam reveals no gallop and no friction rub.   No murmur heard. Pulmonary/Chest: Effort normal and breath sounds normal. No respiratory distress. She has no wheezes. She has no rhonchi. She has no rales. She exhibits no tenderness and no crepitus.    Area of chest pain noted  Abdominal: Soft. Normal appearance and bowel sounds are normal. She exhibits no distension. There is no tenderness. There is no rebound and no guarding.  Genitourinary:  Pt is getting decubitus ulcers see photo, daughter states was just redness of skin the last time she saw it.   Musculoskeletal: Normal range of motion. She exhibits no edema and no tenderness.  Moves all extremities well.   Pt  still has staples in her Rt AKA, no redness, no drainage  Neurological: She is alert and oriented to person, place, and time. She has normal strength. No cranial nerve deficit.  Skin: Skin is warm, dry and intact. No rash noted. No erythema. No pallor.  Psychiatric: She has a normal mood and affect. Her speech is normal and behavior is normal. Her mood appears not anxious.       ED Course  Procedures (including critical care time)  Medications  cefTRIAXone (ROCEPHIN) 1 g in dextrose 5 % 50 mL IVPB (1 g Intravenous New Bag/Given 01/23/14 1541)  hydrocortisone sodium succinate (SOLU-CORTEF) injection 200 mg (not administered)  vancomycin (VANCOCIN) IVPB 1000 mg/200 mL premix (not administered)  oxyCODONE-acetaminophen (PERCOCET/ROXICET) 5-325 MG per tablet 1 tablet (1 tablet Oral Given 01/23/14 1303)  gi cocktail (Maalox,Lidocaine,Donnatal) (30 mLs Oral Given 01/23/14 1304)  famotidine (PEPCID) tablet 20 mg (20 mg Oral Given 01/23/14 1303)  diphenhydrAMINE (BENADRYL) injection 50 mg (50 mg Intravenous Given 01/23/14 1541)   Discussed with patient and daughter her risk of PE is high with recent surgery and inactivity. They are agreeable for further  study. Pt has ESRD on dialysis, will discuss with renal about getting CT angio which would be a better test then VQ scan and availability on weekends is not always available. Daughter states she thought her mother was on some type of anticoagulation at the nursing home. I have asked the pharmacy tach to double check her medication list however he states there is no anticoagulation on her list.  15:26 Dr Arlean Hopping reviewed history and labs, feels she can have the CT angio.   15:27 Dr Mayford Knife, radiology, states she didn't realize I had to get her permission to do the CT angio with prep as I was instructed by the CT tech, states it is okay.   16:00 pt turned over to Dr Manus Gunning at change of shift to get CT angio results. Second troponin pending also.   Labs  Review Results for orders placed during the hospital encounter of 01/23/14  PRO B NATRIURETIC PEPTIDE      Result Value Ref Range   Pro B Natriuretic peptide (BNP) >70000.0 (*) 0 - 125 pg/mL  CBC WITH DIFFERENTIAL      Result Value Ref Range   WBC 10.7 (*) 4.0 - 10.5 K/uL   RBC 4.12  3.87 - 5.11 MIL/uL   Hemoglobin 10.9 (*) 12.0 - 15.0 g/dL   HCT 22.9  79.8 - 92.1 %   MCV 87.4  78.0 - 100.0 fL   MCH 26.5  26.0 - 34.0 pg   MCHC 30.3  30.0 - 36.0 g/dL   RDW 19.4 (*) 17.4 - 08.1 %   Platelets 430 (*) 150 - 400 K/uL   Neutrophils Relative % 82 (*) 43 - 77 %   Neutro Abs 8.8 (*) 1.7 - 7.7 K/uL   Lymphocytes Relative 10 (*) 12 - 46 %   Lymphs Abs 1.1  0.7 - 4.0 K/uL   Monocytes Relative 6  3 - 12 %   Monocytes Absolute 0.6  0.1 - 1.0 K/uL   Eosinophils Relative 2  0 - 5 %   Eosinophils Absolute 0.2  0.0 - 0.7 K/uL   Basophils Relative 0  0 - 1 %   Basophils Absolute 0.0  0.0 - 0.1 K/uL  COMPREHENSIVE METABOLIC PANEL      Result Value Ref Range   Sodium 137  137 - 147 mEq/L   Potassium 4.1  3.7 - 5.3 mEq/L   Chloride 92 (*) 96 - 112 mEq/L   CO2 26  19 - 32 mEq/L   Glucose, Bld 208 (*) 70 - 99 mg/dL   BUN 21  6 - 23 mg/dL   Creatinine, Ser 4.48 (*) 0.50 - 1.10 mg/dL   Calcium 18.5  8.4 - 63.1 mg/dL   Total Protein 7.1  6.0 - 8.3 g/dL   Albumin 2.2 (*) 3.5 - 5.2 g/dL   AST 32  0 - 37 U/L   ALT 15  0 - 35 U/L   Alkaline Phosphatase 134 (*) 39 - 117 U/L   Total Bilirubin 0.4  0.3 - 1.2 mg/dL   GFR calc non Af Amer 11 (*) >90 mL/min   GFR calc Af Amer 13 (*) >90 mL/min  URINALYSIS, ROUTINE W REFLEX MICROSCOPIC      Result Value Ref Range   Color, Urine YELLOW  YELLOW   APPearance TURBID (*) CLEAR   Specific Gravity, Urine 1.021  1.005 - 1.030   pH 6.0  5.0 - 8.0   Glucose, UA NEGATIVE  NEGATIVE mg/dL   Hgb  urine dipstick LARGE (*) NEGATIVE   Bilirubin Urine NEGATIVE  NEGATIVE   Ketones, ur NEGATIVE  NEGATIVE mg/dL   Protein, ur >130 (*) NEGATIVE mg/dL   Urobilinogen, UA 0.2   0.0 - 1.0 mg/dL   Nitrite NEGATIVE  NEGATIVE   Leukocytes, UA LARGE (*) NEGATIVE  URINE MICROSCOPIC-ADD ON      Result Value Ref Range   Squamous Epithelial / LPF RARE  RARE   WBC, UA TOO NUMEROUS TO COUNT  <3 WBC/hpf   RBC / HPF 3-6  <3 RBC/hpf   Bacteria, UA MANY (*) RARE  I-STAT TROPOININ, ED      Result Value Ref Range   Troponin i, poc 0.05  0.00 - 0.08 ng/mL   Comment 3            Laboratory interpretation all normal except renal failure, mild anemia   Imaging Review Dg Chest Portable 1 View  01/23/2014   CLINICAL DATA:  Shortness of breath  EXAM: PORTABLE CHEST - 1 VIEW  COMPARISON:  DG CHEST 1V PORT dated 01/09/2014  FINDINGS: Borderline cardiomegaly noted with evidence of CABG. Right IJ approach dual lumen dialysis catheter tip terminates over the distal SVC at possible high right atrium. No focal pulmonary consolidation is identified. Diffusely prominent reticular markings are reidentified with improved aeration since previously.  IMPRESSION: No focal acute cardiopulmonary process.  Cardiomegaly with stable prominence of diffuse reticular markings, nonspecific.   Electronically Signed   By: Christiana Pellant M.D.   On: 01/23/2014 13:58     Dg Chest 2 View  01/01/2014   CLINICAL DATA:  Short of breath upon exertion  .  IMPRESSION: Congestive heart failure pattern which is mildly worsened from prior. .   Electronically Signed   By: Genevive Bi M.D.   On: 01/01/2014 18:11   Ct Head Wo Contrast  01/05/2014   CLINICAL DATA:  Decreased mental status.  Left hand numbness.e visualized paranasal sinuses, mastoid air cells and middle ears are clear. The calvarium is intact.  IMPRESSION: Stable appearance of the brain.  No acute intracranial findings.   Electronically Signed   By: Roxy Horseman M.D.   On: 01/05/2014 12:58   Mr Brain Wo Contrast  01/06/2014   CLINICAL DATA:  Evaluate for stroke. Decreased mental status. Left hand numbness.    IMPRESSION: No acute stroke or changes  suggestive of hypertensive encephalopathy.  Atrophy and small vessel disease, stable.   Electronically Signed   By: Davonna Belling M.D.   On: 01/06/2014 21:17   Mr Brain Wo Contrast  01/03/2014   CLINICAL DATA:  Left arm numbness. Evaluate for stroke versus related to recent surgical procedure. s.  IMPRESSION: Atrophy.  Chronic microvascular ischemic change.  Remote areas of ischemia without visible acute infarction. No underlying abnormality is seen which might contribute to left arm numbness.   Electronically Signed   By: Davonna Belling M.D.   On: 01/03/2014 17:57   Dg Chest Port 1 View  01/09/2014   CLINICAL DATA:  Central line placement   IMPRESSION: 1. Right IJ central line with the lowest catheter tip in the upper right atrium. No pneumothorax. 2. Worsening CHF.   Electronically Signed   By: Tiburcio Pea M.D.   On: 01/09/2014 02:43   Dg Fluoro Guide Cv Line-no Report  01/09/2014   CLINICAL DATA: check for diatek placement   FLOURO GUIDE CV LINE  Fluoroscopy was utilized by the requesting physician.  No radiographic  interpretation.  EKG Interpretation   Date/Time:  Sunday January 23 2014 11:36:23 EDT Ventricular Rate:  85 PR Interval:  126 QRS Duration: 120 QT Interval:  422 QTC Calculation: 502 R Axis:   -103 Text Interpretation:  Sinus rhythm with marked sinus arrhythmia with  occasional Premature ventricular complexes Right bundle branch block  Inferior infarct , age undetermined Anterolateral infarct , age  undetermined No significant change since last tracing Confirmed by Jaston Havens   MD-I, Hitesh Fouche ([id number]) on 01/23/2014 11:41:49 AM      MDM   Final diagnoses:  Chest pain  Dyspnea  Sacral decubitus ulcer  UTI (urinary tract infection)  ESRD on hemodialysis  Burning chest pain    Disposition pending   Devoria Albe, MD, Armando Gang     Ward Givens, MD 01/23/14 1610

## 2014-01-23 NOTE — ED Notes (Signed)
Helped pt out of car and in to triage

## 2014-01-23 NOTE — ED Notes (Signed)
Patient transported from CT, called 3W.  Pt being transported to floor.

## 2014-01-23 NOTE — ED Notes (Signed)
Pt daughter went home and left her phone number:  [phone]

## 2014-01-23 NOTE — ED Provider Notes (Signed)
Assumed care from Dr. Lynelle Doctor. Patient had episode of chest pain or shortness of breath that has resolved. EKG is unchanged. Concern for PE. She is allergic to IV contrast. Patient had recent AKA 2 weeks ago. She has a UTI and decubitus ulcers. Discussed with hospitalist Dr. Jerral Ralph. He recommends obtaining venous Dopplers to rule out DVT. Will switch to VQ scan to rule out PE. Patient was given vancomycin and Rocephin for her UTI and decubitus ulcers by Dr. Lynelle Doctor.  BP 130/59  Pulse 79  Temp(Src) 98.2 F (36.8 C) (Oral)  Resp 29  SpO2 98%   Rhonda Octave, MD 01/23/14 1707

## 2014-01-23 NOTE — ED Notes (Signed)
Pt. Stated, I was in a nursing home facility and I took my medicine and then I felt SOB and some hurting at my chest.

## 2014-01-23 NOTE — ED Notes (Signed)
Pt. Stated, I think they gave me medicine and and I dropped it and I found it later and then took it, so I think they gave me too much.

## 2014-01-23 NOTE — H&P (Addendum)
PATIENT DETAILS Name: [name] Age: 73 y.o. Sex: female Date of Birth: 1941-05-04 Admit Date: 01/23/2014 [id number] DENNIS, MD   CHIEF COMPLAINT:  Chest pain  HPI: [name] is a 73 y.o. female with a Past Medical History end-stage renal disease recently started on dialysis, history of peripheral vascular disease status post recent right AKA, diabetes, history o fcoronary artery disease status post CABG in 2009, and a recent positive nuclear stress test on medical management, hypertension, chronic systolic heart failure who presented to the hospital today from a skilled nursing facility for evaluation of the above noted complaint. During her recent hospitalization (discharge on 01/15/14)  patient underwent a right AKA and also was started on hemodialysis, she previously was on chronic Coumadin therapy and Coumadin was also stopped at discharge. She comes to the emergency room today complaining of retrosternal chest pain. Please note during my interview the patient, she was somewhat drowsy and had just received narcotics. However she claims that the chest pain was located in the center of her chest, was burning at times and at times sharp. This is associated with a lot of belching. She claims that this was also associated with mild shortness of breath. Apparently, this pain lasted for around 10-15 minutes. During my interview, she she was chest pain-free. Per nursing staff, and daughter, no further recurrence of chest pain. During my interview, patient claimed that she was not able to see from both eyes for since this past Wednesday. Per daughter, [name] who is at bedside, apparently doing last admission she had had a few episodes of transient visual loss. However the patient's daughter also complains that the past 5 days she has lost eyesight in both eyes. This is painless visual loss. From the history obtained, there is no headache, fever, ongoing shortness of breath, nausea,  vomiting or diarrhea. She denies any abdominal pain.   ALLERGIES:   Allergies  Allergen Reactions  . Ivp Dye [Iodinated Diagnostic Agents] Hives and Rash  . Aspirin Hives    PAST MEDICAL HISTORY: Past Medical History  Diagnosis Date  . CAD (coronary artery disease)   . Hypertension   . Diabetes mellitus     type 2 insulin dependent  . Hyperlipidemia   . CHF (congestive heart failure)   . Cellulitis     left foot  . Ulcer     diabetic ulcer on left foot  . DVT (deep venous thrombosis)     peroneal vein  . Peripheral vascular disease   . Chronic kidney disease   . Anemia   . Shortness of breath     with fluid  . Insomnia     PAST SURGICAL HISTORY: Past Surgical History  Procedure Laterality Date  . Tubal ligation    . Pr vein bypass graft,aorto-fem-pop  06-26-11    1. PATENT LEFT FEMORAL-POPLITEAL BYPASS GRAFT W/NO EVEIDENCE OF STENOSIS. 2.VELOCITIES OF GREATER THAN 200 CM/'s NOTED ON PREVIOUS EXAM 10/03/11 WERE NOT ADEQUATELY VISULAIZED DURING THIS EXAM  . Coronary artery bypass graft  04/25/08    Dr. Servando [name], CABG x 5   . Nm myoview ltd  01/13/13    LEXISCAN; LV WALL MOTION: LVEF 44%, INFERIOR AKINESIS, ANTEROAPICAL HYPOKINESIS  . Cardiac catheterization  04/22/08    SEVERE 3-VESSEL DISEASE CAD., CABG X 5 04/25/08 WITH DR. [id number]  . Av fistula placement Left 09/07/2013    Procedure: ARTERIOVENOUS (AV) FISTULA CREATION;  Surgeon: Rhonda Dutch, MD;  Location: [facility]  OR;  Service: Vascular;  Laterality: Left;  . Ligation arteriovenous gortex graft Left 01/09/2014    Procedure: LIGATION ARTERIOVENOUS GORTEX GRAFT;  Surgeon: Rhonda Dutch, MD;  Location: [place];  Service: Vascular;  Laterality: Left;  . Insertion of dialysis catheter Right 01/09/2014    Procedure: INSERTION OF DIALYSIS CATHETER;  Surgeon: Rhonda Dutch, MD;  Location: [place];  Service: Vascular;  Laterality: Right;  Insertion of diatek to right internal Jugular artery.  . Amputation Right 01/12/2014     Procedure: AMPUTATION ABOVE KNEE- RIGHT;  Surgeon: Rhonda Misty, MD;  Location: [place];  Service: Vascular;  Laterality: Right;    MEDICATIONS AT HOME: Prior to Admission medications   Medication Sig Start Date End Date Taking? Authorizing Provider  acetaminophen (TYLENOL) 650 MG CR tablet Take 650 mg by mouth every 8 (eight) hours as needed for pain.   Yes Historical Provider, MD  amLODipine (NORVASC) 5 MG tablet Take 10 mg by mouth daily.    Yes Historical Provider, MD  atorvastatin (LIPITOR) 40 MG tablet Take 40 mg by mouth daily.    Yes Historical Provider, MD  calcitRIOL (ROCALTROL) 0.25 MCG capsule Take 0.5 mcg by mouth daily.   Yes Historical Provider, MD  carvedilol (COREG) 12.5 MG tablet Take 12.5 mg by mouth 2 (two) times daily with a meal.    Yes Historical Provider, MD  diphenhydrAMINE (BENADRYL) 25 MG tablet Take 25 mg by mouth every 6 (six) hours as needed for itching.   Yes Historical Provider, MD  ferrous sulfate 325 (65 FE) MG tablet Take 325 mg by mouth 2 (two) times daily.    Yes Historical Provider, MD  folic acid (FOLVITE) 1 MG tablet Take 1 mg by mouth daily.    Yes Historical Provider, MD  insulin glargine (LANTUS) 100 UNIT/ML injection Inject 0.1 mLs (10 Units total) into the skin at bedtime. 01/15/14  Yes Rhonda Eva, MD  isosorbide mononitrate (IMDUR) 30 MG 24 hr tablet Take 15 mg by mouth daily.   Yes Historical Provider, MD  lanthanum (FOSRENOL) 500 MG chewable tablet Chew 1 tablet (500 mg total) by mouth 3 (three) times daily with meals. 01/15/14  Yes Rhonda Eva, MD  mirtazapine (REMERON) 7.5 MG tablet Take 7.5 mg by mouth at bedtime.   Yes Historical Provider, MD  nitroGLYCERIN (NITROSTAT) 0.4 MG SL tablet Place 0.4 mg under the tongue every 5 (five) minutes as needed for chest pain.   Yes Historical Provider, MD  Nutritional Supplements (FEEDING SUPPLEMENT, NEPRO CARB STEADY,) LIQD Take 237 mLs by mouth 3 (three) times daily with meals. 01/15/14  Yes Rhonda Eva, MD  omega-3  acid ethyl esters (LOVAZA) 1 G capsule Take 1 g by mouth 4 (four) times daily.    Yes Historical Provider, MD  omeprazole (PRILOSEC) 20 MG capsule Take 20 mg by mouth daily.    Yes Historical Provider, MD  oxyCODONE-acetaminophen (PERCOCET) 10-325 MG per tablet Take 1 tablet by mouth 3 (three) times daily as needed for pain. 01/15/14  Yes Rhonda Eva, MD  potassium chloride SA (K-DUR,KLOR-CON) 20 MEQ tablet Take 20 mEq by mouth daily.   Yes Historical Provider, MD  vitamin C (ASCORBIC ACID) 500 MG tablet Take 500 mg by mouth daily.   Yes Historical Provider, MD  zinc sulfate 220 MG capsule Take 220 mg by mouth daily.   Yes Historical Provider, MD    FAMILY HISTORY: Family History  Problem Relation Age of Onset  . Cancer Mother  Kidney  . Kidney disease Mother   . Cancer Father     skin    SOCIAL HISTORY:  reports that she quit smoking about 17 years ago. Her smoking use included Cigarettes. She has a 40 pack-year smoking history. She has quit using smokeless tobacco. Her smokeless tobacco use included Snuff. She reports that she does not drink alcohol or use illicit drugs.  REVIEW OF SYSTEMS:  Constitutional:   No  weight loss, night sweats,  Fevers, chills, fatigue.  HEENT:    No headaches, Difficulty swallowing,Tooth/dental problems,Sore throat,  No sneezing, itching, ear ache, nasal congestion, post nasal drip,   Cardio-vascular: No Orthopnea, PND, swelling in lower extremities, anasarca, dizziness, palpitations.  GI:  No heartburn, indigestion, abdominal pain, nausea, vomiting, diarrhea, change in bowel habits, loss of appetite  Resp: No shortness of breath at rest.  No excess mucus, no productive cough, No non-productive cough,  No coughing up of blood.No change in color of mucus.No wheezing.No chest wall deformity  Skin:  no rash or lesions.  GU:  no dysuria, change in color of urine, no urgency or frequency.  No flank pain.  Musculoskeletal: No joint pain or  swelling.  No decreased range of motion.  No back pain.  Psych: No change in mood or affect. No depression or anxiety.  No memory loss.   PHYSICAL EXAM: Blood pressure 128/68, pulse 76, temperature 98.2 F (36.8 C), temperature source Oral, resp. rate 18, SpO2 94.00%.  General appearance :Awake, alert, not in any distress. Speech is slow but Clear. Not toxic Looking. Somewhat drowsy. HEENT: Atraumatic and Normocephalic, pupils equally reactive to light and accomodation Neck: supple, no JVD. No cervical lymphadenopathy.  Chest:Good air entry bilaterally, no added sounds  CVS: S1 S2 regular, no murmurs.  Abdomen: Bowel sounds present, Non tender and not distended with no gaurding, rigidity or rebound. Extremities: Status post right AKA-staples in place. No edema or leg swelling noted on the left leg Neurology: Awake alert, and oriented X 3, CN II-XII intact, Non focal Skin:No Rash Wounds:N/A  LABS ON ADMISSION:   Recent Labs  01/23/14 1139  NA 137  K 4.1  CL 92*  CO2 26  GLUCOSE 208*  BUN 21  CREATININE 3.73*  CALCIUM 10.0    Recent Labs  01/23/14 1139  AST 32  ALT 15  ALKPHOS 134*  BILITOT 0.4  PROT 7.1  ALBUMIN 2.2*   No results found for this basename: LIPASE, AMYLASE,  in the last 72 hours  Recent Labs  01/23/14 1139  WBC 10.7*  NEUTROABS 8.8*  HGB 10.9*  HCT 36.0  MCV 87.4  PLT 430*    Recent Labs  01/23/14 1627  TROPONINI <0.30    Recent Labs  01/23/14 1657  DDIMER 1.84*   No components found with this basename: POCBNP,    RADIOLOGIC STUDIES ON ADMISSION: Dg Chest Portable 1 View  01/23/2014   CLINICAL DATA:  Shortness of breath  EXAM: PORTABLE CHEST - 1 VIEW  COMPARISON:  DG CHEST 1V PORT dated 01/09/2014  FINDINGS: Borderline cardiomegaly noted with evidence of CABG. Right IJ approach dual lumen dialysis catheter tip terminates over the distal SVC at possible high right atrium. No focal pulmonary consolidation is identified. Diffusely  prominent reticular markings are reidentified with improved aeration since previously.  IMPRESSION: No focal acute cardiopulmonary process.  Cardiomegaly with stable prominence of diffuse reticular markings, nonspecific.   Electronically Signed   By: Conchita Paris M.D.   On: 01/23/2014 13:58  EKG: Independently reviewed. Sinus rhythm, nonspecific ST changes  ASSESSMENT AND PLAN: Present on Admission:  . Chest pain - Both typical and atypical features, could be GI in etiology. However has known coronary artery disease(recent nuclear stress test neg). Also recent prolonged hospitalization- is at risk for venous thromboembolism, however currently is chest pain-free, not tachycardic hypoxic. - At this time, would admit to telemetry, cycle cardiac enzymes. Since she is complaining of bilateral visual loss, she is awaiting a CT of the head, if that is negative for acute bleeding abnormalities, we could potentially start her on anticoagulation until we make sure she does not have a DVT or a pulmonary embolism. A VQ scan and lower extremity Doppler are currently pending. - She apparently has had allergic reactions to aspirin in the past, would hold off on starting antiplatelet agents. Place on PPI and other supportive care.  . Bilateral visual loss - This is painless, has been going on for at least for the past 5 days. - Case discussed with Dr. Patel-ophthalmology on-call, who this time recommends ESR, CRP, agrees with CT of the head. She will evaluate the patient tomorrow morning.  [name] UTI (lower urinary tract infection) - Continue with Rocephin, await urine cultures.   . 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 - No sign of infection, dry gangrene.  . Anemia - Secondary to end-stage renal disease.  - Monitor hemoglobin and hematocrit, erythropoietin per nephrology.   . ESRD on dialysis - Will notify nephrology of patient's admission. Currently no acute indication  for dialysis. She is due for dialysis tomorrow. Her usual days of dialysis on Mondays, Wednesdays and Fridays.   . DM type 2, uncontrolled, with renal complications - Continue Lantus, would also place on SSI. Monitor CBGs and adjust dosing accordingly.   . Multifocal atrial tachycardia - Currently not tachycardic, monitor in telemetry.   . Chronic systolic heart failure - Clinically compensated, diureses with hemodialysis.   . Essential hypertension - Stable, continue amlodipine, Coreg.   [name] COPD (chronic obstructive pulmonary disease) - As needed nebulized bronchodilators   . PVD (peripheral vascular disease)-status post recent right AKA - Staples in place, no discharge erythema at the stump site. - Will need to notify vascular surgery of patient's admission  . Sacral decubitus stage II-3 - Per RN, minimal purulent discharge. We'll obtain ESR. - Empirically started on vancomycin, will be on Rocephin for UTI. - Wound care consultation  . ? History of prior VTE - Recently taken off anticoagulation. - Review of past medical history shows a history of peroneal vein DVT, however a recent lower extremity Doppler was negative. - Await lower extremity Doppler, VQ scan. Currently chest pain-free, not tachycardic or hypoxic.  Very poor overall prognosis. Multiple comorbidities. Have explained to the patient's daughter regarding poor long-term prognosis. She is agreeable to a palliative care discussion. At this time she remains a full code  Further plan will depend as patient's clinical course evolves and further radiologic and laboratory data become available. Patient will be monitored closely.   Above noted plan was discussed with patient/family , they were in agreement.   DVT Prophylaxis: Prophylactic Heparin for now-await CT head  Code Status: Full Code  Total time spent for admission equals 45 minutes.  Jonetta Osgood [facility] Pager [phone]  If 7PM-7AM,  please contact night-coverage [email or web address] Password [facility] 01/23/2014, 6:25 PM

## 2014-01-23 NOTE — ED Notes (Addendum)
Pt crying in room stating that she just wants to know what is wrong. Pt daughter requested to talk to me outside of room and stated that she pt has a hard time understanding what is going on with her. Stated "she still thinks that she has both legs and the blindness comes and goes and has been doing that since last Wednesday" stated that she has been seen by her doctor about the blindness and they think it is due to her diabetes. Pt daughter also stated that she has been seen by doctor about left pinky finger necrosis and that the are planning to have the finger amputated just hasn't said when.

## 2014-01-23 NOTE — Progress Notes (Addendum)
ANTIBIOTIC CONSULT NOTE  Pharmacy Consult for vancomycin Indication: wound infection  Allergies  Allergen Reactions  . Ivp Dye [Iodinated Diagnostic Agents] Hives and Rash  . Aspirin Hives    Patient Measurements: Height: 5\' 3"  (160 cm) Weight: 126 lb 8 oz (57.38 kg) IBW/kg (Calculated) : 52.4  Vital Signs: Temp: 99.3 F (37.4 C) (04/12 2018) Temp src: Oral (04/12 2018) BP: 144/61 mmHg (04/12 2018) Pulse Rate: 79 (04/12 2018) Intake/Output from previous day:   Intake/Output from this shift:    Labs:  Recent Labs  01/23/14 1139  WBC 10.7*  HGB 10.9*  PLT 430*  CREATININE 3.73*   Estimated Creatinine Clearance: 11.3 ml/min (by C-G formula based on Cr of 3.73). No results found for this basename: VANCOTROUGH, Leodis Binet, VANCORANDOM, GENTTROUGH, GENTPEAK, GENTRANDOM, TOBRATROUGH, TOBRAPEAK, TOBRARND, AMIKACINPEAK, AMIKACINTROU, AMIKACIN,  in the last 72 hours   Microbiology: Recent Results (from the past 720 hour(s))  SURGICAL PCR SCREEN     Status: None   Collection Time    01/09/14 12:18 AM      Result Value Ref Range Status   MRSA, PCR NEGATIVE  NEGATIVE Final   Staphylococcus aureus NEGATIVE  NEGATIVE Final   Comment:            The Xpert SA Assay (FDA     approved for NASAL specimens     in patients over 79 years of age),     is one component of     a comprehensive surveillance     program.  Test performance has     been validated by [employer] for patients greater     than or equal to 42 year old.     It is not intended     to diagnose infection nor to     guide or monitor treatment.    Medical History: Past Medical History  Diagnosis Date  . CAD (coronary artery disease)   . Hypertension   . Diabetes mellitus     type 2 insulin dependent  . Hyperlipidemia   . CHF (congestive heart failure)   . Cellulitis     left foot  . Ulcer     diabetic ulcer on left foot  . DVT (deep venous thrombosis)     peroneal vein  . Peripheral vascular  disease   . Chronic kidney disease   . Anemia   . Shortness of breath     with fluid  . Insomnia     Medications:  Prescriptions prior to admission  Medication Sig Dispense Refill  . acetaminophen (TYLENOL) 650 MG CR tablet Take 650 mg by mouth every 8 (eight) hours as needed for pain.      [name] amLODipine (NORVASC) 5 MG tablet Take 10 mg by mouth daily.       [name] atorvastatin (LIPITOR) 40 MG tablet Take 40 mg by mouth daily.       . calcitRIOL (ROCALTROL) 0.25 MCG capsule Take 0.5 mcg by mouth daily.      . carvedilol (COREG) 12.5 MG tablet Take 12.5 mg by mouth 2 (two) times daily with a meal.       . diphenhydrAMINE (BENADRYL) 25 MG tablet Take 25 mg by mouth every 6 (six) hours as needed for itching.      . ferrous sulfate 325 (65 FE) MG tablet Take 325 mg by mouth 2 (two) times daily.       . folic acid (FOLVITE) 1 MG tablet Take 1  mg by mouth daily.       . insulin glargine (LANTUS) 100 UNIT/ML injection Inject 0.1 mLs (10 Units total) into the skin at bedtime.  10 mL  0  . isosorbide mononitrate (IMDUR) 30 MG 24 hr tablet Take 15 mg by mouth daily.      [name]. lanthanum (FOSRENOL) 500 MG chewable tablet Chew 1 tablet (500 mg total) by mouth 3 (three) times daily with meals.  90 tablet  0  . mirtazapine (REMERON) 7.5 MG tablet Take 7.5 mg by mouth at bedtime.      . nitroGLYCERIN (NITROSTAT) 0.4 MG SL tablet Place 0.4 mg under the tongue every 5 (five) minutes as needed for chest pain.      . Nutritional Supplements (FEEDING SUPPLEMENT, NEPRO CARB STEADY,) LIQD Take 237 mLs by mouth 3 (three) times daily with meals.    0  . omega-3 acid ethyl esters (LOVAZA) 1 G capsule Take 1 g by mouth 4 (four) times daily.       [name]. omeprazole (PRILOSEC) 20 MG capsule Take 20 mg by mouth daily.       [name]. oxyCODONE-acetaminophen (PERCOCET) 10-325 MG per tablet Take 1 tablet by mouth 3 (three) times daily as needed for pain.  30 tablet  0  . potassium chloride SA (K-DUR,KLOR-CON) 20 MEQ tablet Take 20 mEq by  mouth daily.      . vitamin C (ASCORBIC ACID) 500 MG tablet Take 500 mg by mouth daily.      [name]. zinc sulfate 220 MG capsule Take 220 mg by mouth daily.       Assessment: 73 year old female ESRD on HD presents with chest pain and wound infection.    Goal of Therapy:  Vancomycin trough level 10-15 mcg/ml  Plan:  Vancomycin 1 g IV x1 - Given in ED Continue vancomycin 500 mg IV qMWF - with HD Continue ceftriaxone 1 g IV q24h Vanc trough as needed F/u cultures F/u HD schedule   Agapito GamesAlison Xoie Kreuser, PharmD, BCPS Clinical Pharmacist Pager: [phone] 01/23/2014 8:29 PM

## 2014-01-23 NOTE — Consult Note (Signed)
Reason for Consult: chest pain Referring Physician: Dr. Sloan Leiter Primary cardiologist: Dr. Tamala Ser [name] is an 73 y.o. female.  HPI: [name] is a 73 yo woman with CAD s/p 5v CABG '09, ESRD with recent dialysis initiation,  hypertension, T2DM, dyslipidemia, heart failure, PVD with recent right AKA 01/12/14 and prolonged hospitalization from 03/20 - 4/6 who presents to the ER with chest pain. She characterizes the pain as substernal, burning and sharp in nature lasting as long as 15 minutes with some associated SOB and burping. She was admitted to the hospitalist service given her multiple medial issues and she also has some new vision loss that is being evaluated. In her recent hospitalization, she had warfarin discontinued and initiation of dialysis for her ESRD. She is followed by Dr. Debara Pickett and she had a positive nuclear stress test 03/14 but cardiac catheterization was deferred given her borderline renal dysfunction and focus on medical therapy. She has no infectious symptoms currently.     Past Medical History  Diagnosis Date  . CAD (coronary artery disease)   . Hypertension   . Diabetes mellitus     type 2 insulin dependent  . Hyperlipidemia   . CHF (congestive heart failure)   . Cellulitis     left foot  . Ulcer     diabetic ulcer on left foot  . DVT (deep venous thrombosis)     peroneal vein  . Peripheral vascular disease   . Chronic kidney disease   . Anemia   . Shortness of breath     with fluid  . Insomnia     Past Surgical History  Procedure Laterality Date  . Tubal ligation    . Pr vein bypass graft,aorto-fem-pop  06-26-11    1. PATENT LEFT FEMORAL-POPLITEAL BYPASS GRAFT W/NO EVEIDENCE OF STENOSIS. 2.VELOCITIES OF GREATER THAN 200 CM/'s NOTED ON PREVIOUS EXAM 10/03/11 WERE NOT ADEQUATELY VISULAIZED DURING THIS EXAM  . Coronary artery bypass graft  04/25/08    Dr. Servando Snare, CABG x 5   . Nm myoview ltd  01/13/13    LEXISCAN; LV WALL MOTION: LVEF 44%,  INFERIOR AKINESIS, ANTEROAPICAL HYPOKINESIS  . Cardiac catheterization  04/22/08    SEVERE 3-VESSEL DISEASE CAD., CABG X 5 04/25/08 WITH DR. ZOXWRUEA  . Av fistula placement Left 09/07/2013    Procedure: ARTERIOVENOUS (AV) FISTULA CREATION;  Surgeon: Elam Dutch, MD;  Location: [facility] OR;  Service: Vascular;  Laterality: Left;  . Ligation arteriovenous gortex graft Left 01/09/2014    Procedure: LIGATION ARTERIOVENOUS GORTEX GRAFT;  Surgeon: Elam Dutch, MD;  Location: [place];  Service: Vascular;  Laterality: Left;  . Insertion of dialysis catheter Right 01/09/2014    Procedure: INSERTION OF DIALYSIS CATHETER;  Surgeon: Elam Dutch, MD;  Location: [place];  Service: Vascular;  Laterality: Right;  Insertion of diatek to right internal Jugular artery.  . Amputation Right 01/12/2014    Procedure: AMPUTATION ABOVE KNEE- RIGHT;  Surgeon: Mal Misty, MD;  Location: [place];  Service: Vascular;  Laterality: Right;    Family History  Problem Relation Age of Onset  . Cancer Mother     Kidney  . Kidney disease Mother   . Cancer Father     skin    Social History:  reports that she quit smoking about 17 years ago. Her smoking use included Cigarettes. She has a 40 pack-year smoking history. She has quit using smokeless tobacco. Her smokeless tobacco use included Snuff. She  reports that she does not drink alcohol or use illicit drugs.  Allergies:  Allergies  Allergen Reactions  . Ivp Dye [Iodinated Diagnostic Agents] Hives and Rash  . Aspirin Hives    Medications:  I have reviewed the patient's current medications. Prior to Admission:  Prescriptions prior to admission  Medication Sig Dispense Refill  . acetaminophen (TYLENOL) 650 MG CR tablet Take 650 mg by mouth every 8 (eight) hours as needed for pain.      [name] amLODipine (NORVASC) 5 MG tablet Take 10 mg by mouth daily.       [name] atorvastatin (LIPITOR) 40 MG tablet Take 40 mg by mouth daily.       . calcitRIOL (ROCALTROL) 0.25 MCG capsule  Take 0.5 mcg by mouth daily.      . carvedilol (COREG) 12.5 MG tablet Take 12.5 mg by mouth 2 (two) times daily with a meal.       . diphenhydrAMINE (BENADRYL) 25 MG tablet Take 25 mg by mouth every 6 (six) hours as needed for itching.      . ferrous sulfate 325 (65 FE) MG tablet Take 325 mg by mouth 2 (two) times daily.       . folic acid (FOLVITE) 1 MG tablet Take 1 mg by mouth daily.       . insulin glargine (LANTUS) 100 UNIT/ML injection Inject 0.1 mLs (10 Units total) into the skin at bedtime.  10 mL  0  . isosorbide mononitrate (IMDUR) 30 MG 24 hr tablet Take 15 mg by mouth daily.      [name] lanthanum (FOSRENOL) 500 MG chewable tablet Chew 1 tablet (500 mg total) by mouth 3 (three) times daily with meals.  90 tablet  0  . mirtazapine (REMERON) 7.5 MG tablet Take 7.5 mg by mouth at bedtime.      . nitroGLYCERIN (NITROSTAT) 0.4 MG SL tablet Place 0.4 mg under the tongue every 5 (five) minutes as needed for chest pain.      . Nutritional Supplements (FEEDING SUPPLEMENT, NEPRO CARB STEADY,) LIQD Take 237 mLs by mouth 3 (three) times daily with meals.    0  . omega-3 acid ethyl esters (LOVAZA) 1 G capsule Take 1 g by mouth 4 (four) times daily.       [name] omeprazole (PRILOSEC) 20 MG capsule Take 20 mg by mouth daily.       [name] oxyCODONE-acetaminophen (PERCOCET) 10-325 MG per tablet Take 1 tablet by mouth 3 (three) times daily as needed for pain.  30 tablet  0  . potassium chloride SA (K-DUR,KLOR-CON) 20 MEQ tablet Take 20 mEq by mouth daily.      . vitamin C (ASCORBIC ACID) 500 MG tablet Take 500 mg by mouth daily.      [name] zinc sulfate 220 MG capsule Take 220 mg by mouth daily.       Scheduled: Continuous:  Results for orders placed during the hospital encounter of 01/23/14 (from the past 48 hour(s))  CBC WITH DIFFERENTIAL     Status: Abnormal   Collection Time    01/23/14 11:39 AM      Result Value Ref Range   WBC 10.7 (*) 4.0 - 10.5 K/uL   RBC 4.12  3.87 - 5.11 MIL/uL   Hemoglobin 10.9 (*) 12.0  - 15.0 g/dL   HCT 36.0  36.0 - 46.0 %   MCV 87.4  78.0 - 100.0 fL   MCH 26.5  26.0 - 34.0 pg   MCHC 30.3  30.0 -  36.0 g/dL   RDW 18.0 (*) 11.5 - 15.5 %   Platelets 430 (*) 150 - 400 K/uL   Neutrophils Relative % 82 (*) 43 - 77 %   Neutro Abs 8.8 (*) 1.7 - 7.7 K/uL   Lymphocytes Relative 10 (*) 12 - 46 %   Lymphs Abs 1.1  0.7 - 4.0 K/uL   Monocytes Relative 6  3 - 12 %   Monocytes Absolute 0.6  0.1 - 1.0 K/uL   Eosinophils Relative 2  0 - 5 %   Eosinophils Absolute 0.2  0.0 - 0.7 K/uL   Basophils Relative 0  0 - 1 %   Basophils Absolute 0.0  0.0 - 0.1 K/uL  COMPREHENSIVE METABOLIC PANEL     Status: Abnormal   Collection Time    01/23/14 11:39 AM      Result Value Ref Range   Sodium 137  137 - 147 mEq/L   Potassium 4.1  3.7 - 5.3 mEq/L   Chloride 92 (*) 96 - 112 mEq/L   CO2 26  19 - 32 mEq/L   Glucose, Bld 208 (*) 70 - 99 mg/dL   BUN 21  6 - 23 mg/dL   Creatinine, Ser 3.73 (*) 0.50 - 1.10 mg/dL   Calcium 10.0  8.4 - 10.5 mg/dL   Total Protein 7.1  6.0 - 8.3 g/dL   Albumin 2.2 (*) 3.5 - 5.2 g/dL   AST 32  0 - 37 U/L   Comment: HEMOLYSIS AT THIS LEVEL MAY AFFECT RESULT   ALT 15  0 - 35 U/L   Alkaline Phosphatase 134 (*) 39 - 117 U/L   Total Bilirubin 0.4  0.3 - 1.2 mg/dL   GFR calc non Af Amer 11 (*) >90 mL/min   GFR calc Af Amer 13 (*) >90 mL/min   Comment: (NOTE)     The eGFR has been calculated using the CKD EPI equation.     This calculation has not been validated in all clinical situations.     eGFR's persistently <90 mL/min signify possible Chronic Kidney     Disease.  PRO B NATRIURETIC PEPTIDE     Status: Abnormal   Collection Time    01/23/14 11:40 AM      Result Value Ref Range   Pro B Natriuretic peptide (BNP) >70000.0 (*) 0 - 125 pg/mL  I-STAT TROPOININ, ED     Status: None   Collection Time    01/23/14 12:05 PM      Result Value Ref Range   Troponin i, poc 0.05  0.00 - 0.08 ng/mL   Comment 3            Comment: Due to the release kinetics of cTnI,     a  negative result within the first hours     of the onset of symptoms does not rule out     myocardial infarction with certainty.     If myocardial infarction is still suspected,     repeat the test at appropriate intervals.  URINALYSIS, ROUTINE W REFLEX MICROSCOPIC     Status: Abnormal   Collection Time    01/23/14  1:34 PM      Result Value Ref Range   Color, Urine YELLOW  YELLOW   APPearance TURBID (*) CLEAR   Specific Gravity, Urine 1.021  1.005 - 1.030   pH 6.0  5.0 - 8.0   Glucose, UA NEGATIVE  NEGATIVE mg/dL   Hgb urine dipstick LARGE (*) NEGATIVE  Bilirubin Urine NEGATIVE  NEGATIVE   Ketones, ur NEGATIVE  NEGATIVE mg/dL   Protein, ur >300 (*) NEGATIVE mg/dL   Urobilinogen, UA 0.2  0.0 - 1.0 mg/dL   Nitrite NEGATIVE  NEGATIVE   Leukocytes, UA LARGE (*) NEGATIVE  URINE MICROSCOPIC-ADD ON     Status: Abnormal   Collection Time    01/23/14  1:34 PM      Result Value Ref Range   Squamous Epithelial / LPF RARE  RARE   WBC, UA TOO NUMEROUS TO COUNT  <3 WBC/hpf   RBC / HPF 3-6  <3 RBC/hpf   Bacteria, UA MANY (*) RARE  TROPONIN I     Status: None   Collection Time    01/23/14  4:27 PM      Result Value Ref Range   Troponin I <0.30  <0.30 ng/mL   Comment:            Due to the release kinetics of cTnI,     a negative result within the first hours     of the onset of symptoms does not rule out     myocardial infarction with certainty.     If myocardial infarction is still suspected,     repeat the test at appropriate intervals.  CBG MONITORING, ED     Status: Abnormal   Collection Time    01/23/14  4:46 PM      Result Value Ref Range   Glucose-Capillary 228 (*) 70 - 99 mg/dL  D-DIMER, QUANTITATIVE     Status: Abnormal   Collection Time    01/23/14  4:57 PM      Result Value Ref Range   D-Dimer, Quant 1.84 (*) 0.00 - 0.48 ug/mL-FEU   Comment:            AT THE INHOUSE ESTABLISHED CUTOFF     VALUE OF 0.48 ug/mL FEU,     THIS ASSAY HAS BEEN DOCUMENTED     IN THE  LITERATURE TO HAVE     A SENSITIVITY AND NEGATIVE     PREDICTIVE VALUE OF AT LEAST     98 TO 99%.  THE TEST RESULT     SHOULD BE CORRELATED WITH     AN ASSESSMENT OF THE CLINICAL     PROBABILITY OF DVT / VTE.    Dg Chest Portable 1 View  01/23/2014   CLINICAL DATA:  Shortness of breath  EXAM: PORTABLE CHEST - 1 VIEW  COMPARISON:  DG CHEST 1V PORT dated 01/09/2014  FINDINGS: Borderline cardiomegaly noted with evidence of CABG. Right IJ approach dual lumen dialysis catheter tip terminates over the distal SVC at possible high right atrium. No focal pulmonary consolidation is identified. Diffusely prominent reticular markings are reidentified with improved aeration since previously.  IMPRESSION: No focal acute cardiopulmonary process.  Cardiomegaly with stable prominence of diffuse reticular markings, nonspecific.   Electronically Signed   By: Conchita Paris M.D.   On: 01/23/2014 13:58    Review of Systems  Constitutional: Negative for fever and chills.  HENT: Negative for ear discharge.   Eyes: Positive for blurred vision.       Blindness - new  Respiratory: Negative for cough and sputum production.   Cardiovascular: Positive for chest pain. Negative for palpitations and claudication.  Gastrointestinal: Negative for nausea and vomiting.  Genitourinary: Negative for dysuria and hematuria.  Musculoskeletal: Negative for myalgias and neck pain.  Skin: Positive for itching.  Neurological: Negative for dizziness, sensory change and  headaches.  Endo/Heme/Allergies: Negative for polydipsia.  Psychiatric/Behavioral: Negative for suicidal ideas, hallucinations and substance abuse.   Blood pressure 129/59, pulse 78, temperature 98.2 F (36.8 C), temperature source Oral, resp. rate 18, SpO2 100.00%. Physical Exam  Nursing note and vitals reviewed. Constitutional: She is oriented to person, place, and time. She appears well-developed and well-nourished. No distress.  HENT:  Head: Normocephalic and  atraumatic.  Mouth/Throat: No oropharyngeal exudate.  Eyes: Conjunctivae are normal. No scleral icterus.  Neck: Normal range of motion. Neck supple. No JVD present. No thyromegaly present.  Cardiovascular: Normal rate, regular rhythm, normal heart sounds and intact distal pulses.  Exam reveals no gallop.   No murmur heard. Respiratory: Effort normal and breath sounds normal. No respiratory distress. She has no wheezes.  GI: Soft. Bowel sounds are normal. She exhibits no distension. There is no tenderness. There is no rebound.  Musculoskeletal: Normal range of motion. She exhibits no edema and no tenderness.  Neurological: She is alert and oriented to person, place, and time. Coordination normal.  Skin: Skin is warm and dry. She is not diaphoretic. No erythema.  Psychiatric: She has a normal mood and affect. Her behavior is normal. Thought content normal.   Labs reviewed; wbc 10.7, h/h 10/36, plt 430, na 137, bun 21/3.73, K 4.1, glucose 208 Troponin negative D-dimer 1.84 Pro BNP > 70K, TSH recent 2.65 ECG: SR, RBBB, PVcs, inferior, anterolateral infarcts, age indeterminate  Problem List Chest Pain CAD s/p CABG ''09 ESRD on hemodialysis Peripheral vascular Disease  T2DM HTN COPD HLD Aspirin and Contrast dye allergies  Assessment/Plan: Ms. Banos is a 73 yo woman with pmh of T2DM, ESRD on hemodialysis, PVD with recent right AKA, CAD with prior CABG, new vision loss who presents with chest pain. Differential diagnosis is musculoskeletal, ACS/UA, atpical chest pain, esophageal spasm, GERD, PE among other etiologies. CTA planned per prior team. Ms. Hodgkiss has known CAD with positive nuclear stress in March 2014. She's currently chest pain free. Cardiac catheterization was previously deferred given stable CAD on medical therapy and borderline renal function. Now she's on dialysis so cardiac catheterization can be considered. Agree with trending cardiac markers.  - telemetry, trend cardiac  markers - assess/evaluate ongoing medical issues, particularly vision loss  - once all medical issues are stable, then consider cardiac catheterization if she is a dual antiplatelet candidate   Jules Husbands 01/23/2014, 7:31 PM

## 2014-01-24 DIAGNOSIS — Z951 Presence of aortocoronary bypass graft: Secondary | ICD-10-CM

## 2014-01-24 DIAGNOSIS — E440 Moderate protein-calorie malnutrition: Secondary | ICD-10-CM | POA: Diagnosis present

## 2014-01-24 DIAGNOSIS — I739 Peripheral vascular disease, unspecified: Secondary | ICD-10-CM

## 2014-01-24 DIAGNOSIS — R9439 Abnormal result of other cardiovascular function study: Secondary | ICD-10-CM

## 2014-01-24 DIAGNOSIS — I5022 Chronic systolic (congestive) heart failure: Secondary | ICD-10-CM

## 2014-01-24 LAB — CBC
HCT: 30.6 % — ABNORMAL LOW (ref 36.0–46.0)
HEMOGLOBIN: 9.5 g/dL — AB (ref 12.0–15.0)
MCH: 26.5 pg (ref 26.0–34.0)
MCHC: 31 g/dL (ref 30.0–36.0)
MCV: 85.5 fL (ref 78.0–100.0)
Platelets: 377 10*3/uL (ref 150–400)
RBC: 3.58 MIL/uL — AB (ref 3.87–5.11)
RDW: 17.8 % — ABNORMAL HIGH (ref 11.5–15.5)
WBC: 9.4 10*3/uL (ref 4.0–10.5)

## 2014-01-24 LAB — BASIC METABOLIC PANEL
BUN: 29 mg/dL — ABNORMAL HIGH (ref 6–23)
CALCIUM: 9.7 mg/dL (ref 8.4–10.5)
CO2: 27 meq/L (ref 19–32)
Chloride: 92 mEq/L — ABNORMAL LOW (ref 96–112)
Creatinine, Ser: 4.3 mg/dL — ABNORMAL HIGH (ref 0.50–1.10)
GFR calc Af Amer: 11 mL/min — ABNORMAL LOW (ref >90)
GFR, EST NON AFRICAN AMERICAN: 9 mL/min — AB (ref >90)
GLUCOSE: 335 mg/dL — AB (ref 70–99)
Potassium: 4.4 mEq/L (ref 3.7–5.3)
SODIUM: 135 meq/L — AB (ref 137–147)

## 2014-01-24 LAB — CREATININE, SERUM
Creatinine, Ser: 4.03 mg/dL — ABNORMAL HIGH (ref 0.50–1.10)
GFR calc Af Amer: 12 mL/min — ABNORMAL LOW (ref >90)
GFR calc non Af Amer: 10 mL/min — ABNORMAL LOW (ref >90)

## 2014-01-24 LAB — TROPONIN I
Troponin I: <0.3 ng/mL (ref <0.30)
Troponin I: <0.3 ng/mL (ref <0.30)

## 2014-01-24 LAB — GLUCOSE, CAPILLARY
GLUCOSE-CAPILLARY: 245 mg/dL — AB (ref 70–99)
GLUCOSE-CAPILLARY: 191 mg/dL — AB (ref 70–99)
Glucose-Capillary: 169 mg/dL — ABNORMAL HIGH (ref 70–99)
Glucose-Capillary: 88 mg/dL (ref 70–99)

## 2014-01-24 LAB — SEDIMENTATION RATE: Sed Rate: 44 mm/hr — ABNORMAL HIGH (ref 0–22)

## 2014-01-24 LAB — HEPARIN LEVEL (UNFRACTIONATED)

## 2014-01-24 LAB — C-REACTIVE PROTEIN: CRP: 14.2 mg/dL — ABNORMAL HIGH (ref <0.60)

## 2014-01-24 MED ORDER — DARBEPOETIN ALFA-POLYSORBATE 100 MCG/0.5ML IJ SOLN
100.0000 ug | INTRAMUSCULAR | Status: DC
  Administered 2014-02-02: 100 ug via INTRAVENOUS
  Filled 2014-01-24 (×3): qty 0.5

## 2014-01-24 MED ORDER — DOXERCALCIFEROL 4 MCG/2ML IV SOLN
2.0000 ug | Freq: Once | INTRAVENOUS | Status: AC
  Administered 2014-01-27: via INTRAVENOUS
  Filled 2014-01-24: qty 2

## 2014-01-24 MED ORDER — HYDRALAZINE HCL 25 MG PO TABS
25.0000 mg | ORAL_TABLET | Freq: Two times a day (BID) | ORAL | Status: DC
  Administered 2014-01-24 – 2014-02-01 (×13): 25 mg via ORAL
  Filled 2014-01-24 (×18): qty 1

## 2014-01-24 MED ORDER — HEPARIN SODIUM (PORCINE) 5000 UNIT/ML IJ SOLN
5000.0000 [IU] | Freq: Three times a day (TID) | INTRAMUSCULAR | Status: DC
  Administered 2014-01-24 – 2014-02-01 (×22): 5000 [IU] via SUBCUTANEOUS
  Filled 2014-01-24 (×27): qty 1

## 2014-01-24 MED ORDER — COLLAGENASE 250 UNIT/GM EX OINT
TOPICAL_OINTMENT | Freq: Every day | CUTANEOUS | Status: DC
  Administered 2014-01-25: 1 via TOPICAL
  Administered 2014-01-26: 06:00:00 via TOPICAL
  Filled 2014-01-24: qty 30

## 2014-01-24 MED ORDER — ISOSORBIDE MONONITRATE ER 30 MG PO TB24
30.0000 mg | ORAL_TABLET | Freq: Every day | ORAL | Status: DC
  Administered 2014-01-24 – 2014-02-02 (×7): 30 mg via ORAL
  Filled 2014-01-24 (×10): qty 1

## 2014-01-24 MED ORDER — RENA-VITE PO TABS
1.0000 | ORAL_TABLET | Freq: Every day | ORAL | Status: DC
Start: 2014-01-24 — End: 2014-02-02
  Administered 2014-01-24: 1 via ORAL
  Administered 2014-01-25: 23:00:00 via ORAL
  Administered 2014-01-26 – 2014-02-01 (×6): 1 via ORAL
  Filled 2014-01-24 (×11): qty 1

## 2014-01-24 MED ORDER — VANCOMYCIN HCL 500 MG IV SOLR
500.0000 mg | INTRAVENOUS | Status: DC
  Filled 2014-01-24: qty 500

## 2014-01-24 NOTE — Progress Notes (Signed)
UR Completed Eliazar Olivar Graves-Bigelow, RN,BSN [phone]  

## 2014-01-24 NOTE — Consult Note (Addendum)
Verne CarrowMary P XXXBynum                                                                               01/24/2014                                               Pediatric Ophthalmology Consultation                                         Consult requested by: Dr. Jerral RalphGhimire  Reason for consultation:  Loss of vision  HPI: 73yo female with complex medical history and significant morbidity of cardiovascular and renal systems secondary to diabetes, as well as COPD with a 9267yr smoking history. Pt cannot tell MD when she last visited an eye care provider. Pt unable to give good timeline of events but states that there has been recent loss of vision in the left eye. She states she thinks she might not be able to see light; later during exam, says she can see light but nothing else.  Pertinent Medical History:   Active Ambulatory Problems    Diagnosis Date Noted  . PVD (peripheral vascular disease) 10/29/2011  . Peripheral vascular disease, unspecified 05/13/2012  . S/P CABG x 5 03/02/2013  . Chronic systolic heart failure 03/02/2013  . Abnormal nuclear stress test 03/02/2013  . CKD (chronic kidney disease), stage IV 03/02/2013  . H/O epistaxis 03/02/2013  . COPD (chronic obstructive pulmonary disease) 03/02/2013  . OSA on CPAP 03/02/2013  . Essential hypertension 03/30/2013  . Acute on chronic systolic CHF (congestive heart failure) 08/17/2013  . Renal failure, acute on chronic stage 4 08/17/2013  . Anemia 08/17/2013  . Atherosclerotic PVD with ulceration 12/31/2013  . DM type 2, uncontrolled, with renal complications 01/01/2014  . Palliative care encounter 01/10/2014  . ESRD on dialysis 01/13/2014  . Multifocal atrial tachycardia 01/14/2014   Resolved Ambulatory Problems    Diagnosis Date Noted  . No Resolved Ambulatory Problems   Past Medical History  Diagnosis Date  . CAD (coronary artery disease)   . Hypertension   . Diabetes mellitus   . Hyperlipidemia   . CHF (congestive heart failure)    . Cellulitis   . Ulcer   . DVT (deep venous thrombosis)   . Peripheral vascular disease   . Chronic kidney disease   . Shortness of breath   . Insomnia      Pertinent Ophthalmic History: Reading glasses  Current Eye Medications: none  Systemic medications on admission:   Medications Prior to Admission  Medication Sig Dispense Refill  . acetaminophen (TYLENOL) 650 MG CR tablet Take 650 mg by mouth every 8 (eight) hours as needed for pain.      [name]. amLODipine (NORVASC) 5 MG tablet Take 10 mg by mouth daily.       [name]. atorvastatin (LIPITOR) 40 MG tablet Take 40 mg by mouth daily.       . calcitRIOL (ROCALTROL) 0.25 MCG capsule  Take 0.5 mcg by mouth daily.      . carvedilol (COREG) 12.5 MG tablet Take 12.5 mg by mouth 2 (two) times daily with a meal.       . diphenhydrAMINE (BENADRYL) 25 MG tablet Take 25 mg by mouth every 6 (six) hours as needed for itching.      . ferrous sulfate 325 (65 FE) MG tablet Take 325 mg by mouth 2 (two) times daily.       . folic acid (FOLVITE) 1 MG tablet Take 1 mg by mouth daily.       . insulin glargine (LANTUS) 100 UNIT/ML injection Inject 0.1 mLs (10 Units total) into the skin at bedtime.  10 mL  0  . isosorbide mononitrate (IMDUR) 30 MG 24 hr tablet Take 15 mg by mouth daily.      [name] lanthanum (FOSRENOL) 500 MG chewable tablet Chew 1 tablet (500 mg total) by mouth 3 (three) times daily with meals.  90 tablet  0  . mirtazapine (REMERON) 7.5 MG tablet Take 7.5 mg by mouth at bedtime.      . nitroGLYCERIN (NITROSTAT) 0.4 MG SL tablet Place 0.4 mg under the tongue every 5 (five) minutes as needed for chest pain.      . Nutritional Supplements (FEEDING SUPPLEMENT, NEPRO CARB STEADY,) LIQD Take 237 mLs by mouth 3 (three) times daily with meals.    0  . omega-3 acid ethyl esters (LOVAZA) 1 G capsule Take 1 g by mouth 4 (four) times daily.       [name] omeprazole (PRILOSEC) 20 MG capsule Take 20 mg by mouth daily.       [name] oxyCODONE-acetaminophen (PERCOCET) 10-325 MG per  tablet Take 1 tablet by mouth 3 (three) times daily as needed for pain.  30 tablet  0  . potassium chloride SA (K-DUR,KLOR-CON) 20 MEQ tablet Take 20 mEq by mouth daily.      . vitamin C (ASCORBIC ACID) 500 MG tablet Take 500 mg by mouth daily.      [name] zinc sulfate 220 MG capsule Take 220 mg by mouth daily.           ROS: decreased vision in both eyes, left much worse than right; pain in right leg from recent AKA  Visual Fields: Constricted OD, no perception OS for field testing   Pupils:  Pharmacologically dilated at my direction before exam   Equal, very small from pain medication and difficult to assess, sluggish, no APD  Near acuity:   [place]    OD  20/400 near         OS   LP, ?HM?  TA:  Normal to palpation OU     Dilation:  both eyes        Medication used  [  ] NS 2.5% [  ]Tropicamide  [ X ] Cyclogyl [  ] Cyclomydril  External:   OD:  Normal      OS:  Normal     Anterior segment exam:  By penlight     Conjunctiva:  OD:  Quiet     OS:  Quiet    Cornea:    OD: Clear, no fluorescein stain, significant arcus      OS: Clear, no fluorescein stain, significant arcus  Anterior Chamber:   OD:  Deep/quiet     OS:  Deep/quiet    Iris:    OD:  Normal      OS:  Normal     Lens:  OD:  1-2+ NS OU      OS:  1-2+ NS OU  Optic disc:  OD:  Flat, sharp, pink, healthy  C:D 0.35 with what looks to be old frond of NV   OS:  Flat, sharp, pink, healthy   C:D 0.35 with large, florid NV, ~1/2disc with frond ~1DD  Central retina--examined with indirect ophthalmoscope:  OD:  Macula and vessels flat; PDR with 4quads of DBHs and two areas of NVE ~1/4DD along superotemporal arcade  OS:  Poor visibility of macula due to large inferior vitreous hemorrhage, appears to be resolving; Large frond of NVD superiorly; no tractional retinal detachments seen  Peripheral retina--examined with indirect ophthalmoscope:   OD:  As described above  OS:  No view inferiorly; otherwise appears  flat  Impression:  1) 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 I will contact a retina specialist regarding Ms. Kehres and make an addendum with information regarding an appointment and contact information.  I've discussed these findings with the nurse. Please contact our office with any questions or concerns at [phone].   French Ana      Addendum 01/25/14@11 :31am: Discussed Ms. Smallman's case with Dr. Fawn Kirk, a retina specialist. He has agreed to see Ms. Stanislaw for further evaluation and treatment options for her diabetic retinopathy. Please call my office if there are any further needs.  Despina Hidden

## 2014-01-24 NOTE — Consult Note (Signed)
[facility] Renal Consultation Note  Indication for Consultation:  Management of ESRD/hemodialysis; anemia, hypertension/volume and secondary hyperparathyroidism  HPI: [name] is a 73 y.o. female resident of [place] with hypertension, Type 2 Diabetes, CAD s/p CABG x 5 in 2009, PVD s/p right AKA 01/12/14, and ESRD on dialysis, initiated during hospitalization 3/20-4/4 with only three outpatient treatments at the [facility], who presented yesterday with mid chest and epigastric pain, feeling as if she had to belch.  She had a positive nuclear stress test 01/13/13, but cardiac catheterization was deferred secondary to her poor renal function.  Since this admission, cardiac enzymes have been negative, but she was seen by her cardiologst, Dr. Debara Pickett, who adjusted her medications.  She denies any fever, nausea, vomiting, or diarrhea and has had no chest pain since taking Pepsid and a GI cocktail in the ED.  She also reports a worsening sacral ulcer and has been seen by Wound Care.  She had a left brachiocephalic fistula which required ligation with catheter placement on 3/29 secondary to ischemia in her left hand.  Her scheduled outpatient dialysis is today, and she is currently stable.  Dialysis Orders:  MWF @ [facility] 4 hrs    57 kg     2K/2.25Ca      400/A1.5      Heparin 1800 U      R IJ catheter Hectorol 2 mcg       Epogen 6000 U       Venofer 50 mg on Wed  Past Medical History  Diagnosis Date  . CAD (coronary artery disease)   . Hypertension   . Diabetes mellitus     type 2 insulin dependent  . Hyperlipidemia   . CHF (congestive heart failure)   . Cellulitis     left foot  . Ulcer     diabetic ulcer on left foot  . DVT (deep venous thrombosis)     peroneal vein  . Peripheral vascular disease   . Chronic kidney disease   . Anemia   . Shortness of breath     with fluid  . Insomnia    Past Surgical History  Procedure Laterality Date  . Tubal  ligation    . Pr vein bypass graft,aorto-fem-pop  06-26-11    1. PATENT LEFT FEMORAL-POPLITEAL BYPASS GRAFT W/NO EVEIDENCE OF STENOSIS. 2.VELOCITIES OF GREATER THAN 200 CM/'s NOTED ON PREVIOUS EXAM 10/03/11 WERE NOT ADEQUATELY VISULAIZED DURING THIS EXAM  . Coronary artery bypass graft  04/25/08    Dr. Servando Snare, CABG x 5   . Nm myoview ltd  01/13/13    LEXISCAN; LV WALL MOTION: LVEF 44%, INFERIOR AKINESIS, ANTEROAPICAL HYPOKINESIS  . Cardiac catheterization  04/22/08    SEVERE 3-VESSEL DISEASE CAD., CABG X 5 04/25/08 WITH DR. [id number]  . Av fistula placement Left 09/07/2013    Procedure: ARTERIOVENOUS (AV) FISTULA CREATION;  Surgeon: Elam Dutch, MD;  Location: [facility] OR;  Service: Vascular;  Laterality: Left;  . Ligation arteriovenous gortex graft Left 01/09/2014    Procedure: LIGATION ARTERIOVENOUS GORTEX GRAFT;  Surgeon: Elam Dutch, MD;  Location: [place];  Service: Vascular;  Laterality: Left;  . Insertion of dialysis catheter Right 01/09/2014    Procedure: INSERTION OF DIALYSIS CATHETER;  Surgeon: Elam Dutch, MD;  Location: [place];  Service: Vascular;  Laterality: Right;  Insertion of diatek to right internal Jugular artery.  . Amputation Right 01/12/2014    Procedure: AMPUTATION ABOVE KNEE- RIGHT;  Surgeon: Mal Misty, MD;  Location: [facility] OR;  Service: Vascular;  Laterality: Right;   Family History  Problem Relation Age of Onset  . Cancer Mother     Kidney  . Kidney disease Mother   . Cancer Father     skin   Social History She quit smoking cigarettes about 17 years ago after using as much as two packs a day.  She occasionally drank alcohol, but denies any illicit drug use.   Allergies  Allergen Reactions  . Ivp Dye [Iodinated Diagnostic Agents] Hives and Rash  . Aspirin Hives   Prior to Admission medications   Medication Sig Start Date End Date Taking? Authorizing Provider  acetaminophen (TYLENOL) 650 MG CR tablet Take 650 mg by mouth every 8 (eight) hours as needed for  pain.   Yes Historical Provider, MD  amLODipine (NORVASC) 5 MG tablet Take 10 mg by mouth daily.    Yes Historical Provider, MD  atorvastatin (LIPITOR) 40 MG tablet Take 40 mg by mouth daily.    Yes Historical Provider, MD  calcitRIOL (ROCALTROL) 0.25 MCG capsule Take 0.5 mcg by mouth daily.   Yes Historical Provider, MD  carvedilol (COREG) 12.5 MG tablet Take 12.5 mg by mouth 2 (two) times daily with a meal.    Yes Historical Provider, MD  diphenhydrAMINE (BENADRYL) 25 MG tablet Take 25 mg by mouth every 6 (six) hours as needed for itching.   Yes Historical Provider, MD  ferrous sulfate 325 (65 FE) MG tablet Take 325 mg by mouth 2 (two) times daily.    Yes Historical Provider, MD  folic acid (FOLVITE) 1 MG tablet Take 1 mg by mouth daily.    Yes Historical Provider, MD  insulin glargine (LANTUS) 100 UNIT/ML injection Inject 0.1 mLs (10 Units total) into the skin at bedtime. 01/15/14  Yes Orson Eva, MD  isosorbide mononitrate (IMDUR) 30 MG 24 hr tablet Take 15 mg by mouth daily.   Yes Historical Provider, MD  lanthanum (FOSRENOL) 500 MG chewable tablet Chew 1 tablet (500 mg total) by mouth 3 (three) times daily with meals. 01/15/14  Yes Orson Eva, MD  mirtazapine (REMERON) 7.5 MG tablet Take 7.5 mg by mouth at bedtime.   Yes Historical Provider, MD  nitroGLYCERIN (NITROSTAT) 0.4 MG SL tablet Place 0.4 mg under the tongue every 5 (five) minutes as needed for chest pain.   Yes Historical Provider, MD  Nutritional Supplements (FEEDING SUPPLEMENT, NEPRO CARB STEADY,) LIQD Take 237 mLs by mouth 3 (three) times daily with meals. 01/15/14  Yes Orson Eva, MD  omega-3 acid ethyl esters (LOVAZA) 1 G capsule Take 1 g by mouth 4 (four) times daily.    Yes Historical Provider, MD  omeprazole (PRILOSEC) 20 MG capsule Take 20 mg by mouth daily.    Yes Historical Provider, MD  oxyCODONE-acetaminophen (PERCOCET) 10-325 MG per tablet Take 1 tablet by mouth 3 (three) times daily as needed for pain. 01/15/14  Yes Orson Eva, MD   potassium chloride SA (K-DUR,KLOR-CON) 20 MEQ tablet Take 20 mEq by mouth daily.   Yes Historical Provider, MD  vitamin C (ASCORBIC ACID) 500 MG tablet Take 500 mg by mouth daily.   Yes Historical Provider, MD  zinc sulfate 220 MG capsule Take 220 mg by mouth daily.   Yes Historical Provider, MD   Labs:  Results for orders placed during the hospital encounter of 01/23/14 (from the past 48 hour(s))  CBC WITH DIFFERENTIAL     Status: Abnormal  Collection Time    01/23/14 11:39 AM      Result Value Ref Range   WBC 10.7 (*) 4.0 - 10.5 K/uL   RBC 4.12  3.87 - 5.11 MIL/uL   Hemoglobin 10.9 (*) 12.0 - 15.0 g/dL   HCT 36.0  36.0 - 46.0 %   MCV 87.4  78.0 - 100.0 fL   MCH 26.5  26.0 - 34.0 pg   MCHC 30.3  30.0 - 36.0 g/dL   RDW 18.0 (*) 11.5 - 15.5 %   Platelets 430 (*) 150 - 400 K/uL   Neutrophils Relative % 82 (*) 43 - 77 %   Neutro Abs 8.8 (*) 1.7 - 7.7 K/uL   Lymphocytes Relative 10 (*) 12 - 46 %   Lymphs Abs 1.1  0.7 - 4.0 K/uL   Monocytes Relative 6  3 - 12 %   Monocytes Absolute 0.6  0.1 - 1.0 K/uL   Eosinophils Relative 2  0 - 5 %   Eosinophils Absolute 0.2  0.0 - 0.7 K/uL   Basophils Relative 0  0 - 1 %   Basophils Absolute 0.0  0.0 - 0.1 K/uL  COMPREHENSIVE METABOLIC PANEL     Status: Abnormal   Collection Time    01/23/14 11:39 AM      Result Value Ref Range   Sodium 137  137 - 147 mEq/L   Potassium 4.1  3.7 - 5.3 mEq/L   Chloride 92 (*) 96 - 112 mEq/L   CO2 26  19 - 32 mEq/L   Glucose, Bld 208 (*) 70 - 99 mg/dL   BUN 21  6 - 23 mg/dL   Creatinine, Ser 3.73 (*) 0.50 - 1.10 mg/dL   Calcium 10.0  8.4 - 10.5 mg/dL   Total Protein 7.1  6.0 - 8.3 g/dL   Albumin 2.2 (*) 3.5 - 5.2 g/dL   AST 32  0 - 37 U/L   Comment: HEMOLYSIS AT THIS LEVEL MAY AFFECT RESULT   ALT 15  0 - 35 U/L   Alkaline Phosphatase 134 (*) 39 - 117 U/L   Total Bilirubin 0.4  0.3 - 1.2 mg/dL   GFR calc non Af Amer 11 (*) >90 mL/min   GFR calc Af Amer 13 (*) >90 mL/min   Comment: (NOTE)     The eGFR  has been calculated using the CKD EPI equation.     This calculation has not been validated in all clinical situations.     eGFR's persistently <90 mL/min signify possible Chronic Kidney     Disease.  PRO B NATRIURETIC PEPTIDE     Status: Abnormal   Collection Time    01/23/14 11:40 AM      Result Value Ref Range   Pro B Natriuretic peptide (BNP) >70000.0 (*) 0 - 125 pg/mL  I-STAT TROPOININ, ED     Status: None   Collection Time    01/23/14 12:05 PM      Result Value Ref Range   Troponin i, poc 0.05  0.00 - 0.08 ng/mL   Comment 3            Comment: Due to the release kinetics of cTnI,     a negative result within the first hours     of the onset of symptoms does not rule out     myocardial infarction with certainty.     If myocardial infarction is still suspected,     repeat the test at appropriate intervals.  URINALYSIS,  ROUTINE W REFLEX MICROSCOPIC     Status: Abnormal   Collection Time    01/23/14  1:34 PM      Result Value Ref Range   Color, Urine YELLOW  YELLOW   APPearance TURBID (*) CLEAR   Specific Gravity, Urine 1.021  1.005 - 1.030   pH 6.0  5.0 - 8.0   Glucose, UA NEGATIVE  NEGATIVE mg/dL   Hgb urine dipstick LARGE (*) NEGATIVE   Bilirubin Urine NEGATIVE  NEGATIVE   Ketones, ur NEGATIVE  NEGATIVE mg/dL   Protein, ur >300 (*) NEGATIVE mg/dL   Urobilinogen, UA 0.2  0.0 - 1.0 mg/dL   Nitrite NEGATIVE  NEGATIVE   Leukocytes, UA LARGE (*) NEGATIVE  URINE MICROSCOPIC-ADD ON     Status: Abnormal   Collection Time    01/23/14  1:34 PM      Result Value Ref Range   Squamous Epithelial / LPF RARE  RARE   WBC, UA TOO NUMEROUS TO COUNT  <3 WBC/hpf   RBC / HPF 3-6  <3 RBC/hpf   Bacteria, UA MANY (*) RARE  URINE CULTURE     Status: None   Collection Time    01/23/14  1:34 PM      Result Value Ref Range   Specimen Description URINE, CLEAN CATCH     Special Requests ADD [id number]     Culture  Setup Time       Value: 01/23/2014 17:34     Performed at [facility]       Value: >=100,000 COLONIES/ML     Performed at [facility]   Culture       Value: [place]     Performed at [facility]   Report Status PENDING    TROPONIN I     Status: None   Collection Time    01/23/14  4:27 PM      Result Value Ref Range   Troponin I <0.30  <0.30 ng/mL   Comment:            Due to the release kinetics of cTnI,     a negative result within the first hours     of the onset of symptoms does not rule out     myocardial infarction with certainty.     If myocardial infarction is still suspected,     repeat the test at appropriate intervals.  CBG MONITORING, ED     Status: Abnormal   Collection Time    01/23/14  4:46 PM      Result Value Ref Range   Glucose-Capillary 228 (*) 70 - 99 mg/dL  D-DIMER, QUANTITATIVE     Status: Abnormal   Collection Time    01/23/14  4:57 PM      Result Value Ref Range   D-Dimer, Quant 1.84 (*) 0.00 - 0.48 ug/mL-FEU   Comment:            AT THE INHOUSE ESTABLISHED CUTOFF     VALUE OF 0.48 ug/mL FEU,     THIS ASSAY HAS BEEN DOCUMENTED     IN THE LITERATURE TO HAVE     A SENSITIVITY AND NEGATIVE     PREDICTIVE VALUE OF AT LEAST     98 TO 99%.  THE TEST RESULT     SHOULD BE CORRELATED WITH     AN ASSESSMENT OF THE CLINICAL     PROBABILITY OF DVT / VTE.  SEDIMENTATION RATE     Status: Abnormal   Collection Time    01/23/14  6:36 PM      Result Value Ref Range   Sed Rate 41 (*) 0 - 22 mm/hr  C-REACTIVE PROTEIN     Status: Abnormal   Collection Time    01/23/14  6:36 PM      Result Value Ref Range   CRP 14.2 (*) <0.60 mg/dL   Comment: Performed at [facility] PCR SCREENING     Status: None   Collection Time    01/23/14  9:16 PM      Result Value Ref Range   MRSA by PCR NEGATIVE  NEGATIVE   Comment:            The GeneXpert MRSA Assay (FDA     approved for NASAL specimens     only), is one component of a     comprehensive MRSA colonization      surveillance program. It is not     intended to diagnose MRSA     infection nor to guide or     monitor treatment for     MRSA infections.  GLUCOSE, CAPILLARY     Status: Abnormal   Collection Time    01/23/14 10:52 PM      Result Value Ref Range   Glucose-Capillary 211 (*) 70 - 99 mg/dL  CBC     Status: Abnormal   Collection Time    01/23/14 11:00 PM      Result Value Ref Range   WBC 10.6 (*) 4.0 - 10.5 K/uL   RBC 3.84 (*) 3.87 - 5.11 MIL/uL   Hemoglobin 10.2 (*) 12.0 - 15.0 g/dL   HCT 32.8 (*) 36.0 - 46.0 %   MCV 85.4  78.0 - 100.0 fL   MCH 26.6  26.0 - 34.0 pg   MCHC 31.1  30.0 - 36.0 g/dL   RDW 17.7 (*) 11.5 - 15.5 %   Platelets 403 (*) 150 - 400 K/uL  CREATININE, SERUM     Status: Abnormal   Collection Time    01/23/14 11:00 PM      Result Value Ref Range   Creatinine, Ser 4.03 (*) 0.50 - 1.10 mg/dL   GFR calc non Af Amer 10 (*) >90 mL/min   GFR calc Af Amer 12 (*) >90 mL/min   Comment: (NOTE)     The eGFR has been calculated using the CKD EPI equation.     This calculation has not been validated in all clinical situations.     eGFR's persistently <90 mL/min signify possible Chronic Kidney     Disease.  TROPONIN I     Status: None   Collection Time    01/23/14 11:00 PM      Result Value Ref Range   Troponin I <0.30  <0.30 ng/mL   Comment:            Due to the release kinetics of cTnI,     a negative result within the first hours     of the onset of symptoms does not rule out     myocardial infarction with certainty.     If myocardial infarction is still suspected,     repeat the test at appropriate intervals.  SEDIMENTATION RATE     Status: Abnormal   Collection Time    01/23/14 11:00 PM      Result Value Ref Range   Sed Rate 44 (*)  0 - 22 mm/hr  BASIC METABOLIC PANEL     Status: Abnormal   Collection Time    01/24/14  3:40 AM      Result Value Ref Range   Sodium 135 (*) 137 - 147 mEq/L   Potassium 4.4  3.7 - 5.3 mEq/L   Chloride 92 (*) 96 - 112 mEq/L    CO2 27  19 - 32 mEq/L   Glucose, Bld 335 (*) 70 - 99 mg/dL   BUN 29 (*) 6 - 23 mg/dL   Creatinine, Ser 4.30 (*) 0.50 - 1.10 mg/dL   Calcium 9.7  8.4 - 10.5 mg/dL   GFR calc non Af Amer 9 (*) >90 mL/min   GFR calc Af Amer 11 (*) >90 mL/min   Comment: (NOTE)     The eGFR has been calculated using the CKD EPI equation.     This calculation has not been validated in all clinical situations.     eGFR's persistently <90 mL/min signify possible Chronic Kidney     Disease.  CBC     Status: Abnormal   Collection Time    01/24/14  3:40 AM      Result Value Ref Range   WBC 9.4  4.0 - 10.5 K/uL   RBC 3.58 (*) 3.87 - 5.11 MIL/uL   Hemoglobin 9.5 (*) 12.0 - 15.0 g/dL   HCT 30.6 (*) 36.0 - 46.0 %   MCV 85.5  78.0 - 100.0 fL   MCH 26.5  26.0 - 34.0 pg   MCHC 31.0  30.0 - 36.0 g/dL   RDW 17.8 (*) 11.5 - 15.5 %   Platelets 377  150 - 400 K/uL  TROPONIN I     Status: None   Collection Time    01/24/14  3:40 AM      Result Value Ref Range   Troponin I <0.30  <0.30 ng/mL   Comment:            Due to the release kinetics of cTnI,     a negative result within the first hours     of the onset of symptoms does not rule out     myocardial infarction with certainty.     If myocardial infarction is still suspected,     repeat the test at appropriate intervals.  HEPARIN LEVEL (UNFRACTIONATED)     Status: Abnormal   Collection Time    01/24/14  3:40 AM      Result Value Ref Range   Heparin Unfractionated <0.10 (*) 0.30 - 0.70 IU/mL   Comment:            IF HEPARIN RESULTS ARE BELOW     EXPECTED VALUES, AND PATIENT     DOSAGE HAS BEEN CONFIRMED,     SUGGEST FOLLOW UP TESTING     OF ANTITHROMBIN III LEVELS.  GLUCOSE, CAPILLARY     Status: Abnormal   Collection Time    01/24/14  7:28 AM      Result Value Ref Range   Glucose-Capillary 245 (*) 70 - 99 mg/dL  TROPONIN I     Status: None   Collection Time    01/24/14 10:00 AM      Result Value Ref Range   Troponin I <0.30  <0.30 ng/mL    Comment:            Due to the release kinetics of cTnI,     a negative result within the first hours  of the onset of symptoms does not rule out     myocardial infarction with certainty.     If myocardial infarction is still suspected,     repeat the test at appropriate intervals.   Constitutional: negative for chills, fatigue, fevers and sweats Ears, nose, mouth, throat, and face: negative for earaches, hoarseness, nasal congestion and sore throat Respiratory: negative for cough, dyspnea on exertion, hemoptysis and sputum Cardiovascular: negative for chest pain, chest pressure/discomfort, dyspnea, orthopnea and palpitations Gastrointestinal: negative for abdominal pain, change in bowel habits, nausea and vomiting Genitourinary:negative, non-oliguric Musculoskeletal:positive for pain secondary to sacral ulcer, negative for arthralgias, myalgias and neck pain Neurological: negative for dizziness, headaches, speech problems and weakness  Physical Exam: Filed Vitals:   01/24/14 0504  BP: 147/65  Pulse: 80  Temp: 98 F (36.7 C)  Resp: 18     General appearance: alert, cooperative and no distress Head: Normocephalic, without obvious abnormality, atraumatic Neck: no adenopathy, no carotid bruit, no JVD and supple, symmetrical, trachea midline Resp: clear to auscultation bilaterally Cardio: regular rate and rhythm, S1, S2 normal, no murmur, click, rub or gallop GI: soft, non-tender; bowel sounds normal; no masses,  no organomegaly Extremities: ischemic left 5th finger, right AKA with staples in place.  Healing well.  no edema Neurologic: Grossly normal   Dialysis Access: R IJ catheter   Assessment/Plan: 1. Chest pain - now resolved, Hx CAD s/p CABG x 5 in 2009, cardiac enzymes negative, possibly GI etiology, seen by Cardiology, treating medically. 2. Sacral ulcer - worsening, started Vancomycin & Rocephin, seen by Wound Care. 3. ESRD - HD on MWF @ [facility], K 4.4.  HD  pending. 4. Hypertension/volume - BP 147/65 on Amlodipine 10 mg qd, Carvedilol 12.5 mg bid, Hydralazine 25 mg bid; wt 59 kg, chest x-ray negative. 5. Anemia - Hgb 9.5, outpatient Epogen & Fe.  Aranesp 100 mcg & weekly Fe. 6. Metabolic bone disease - Ca 9.7; Hectorol 2 mcg, Fosrenol 500 mg with meals. 7. Nutrition - carb-mod renal diet, vitamin. 8. Dialysis access - LUA AVF ligated 3/29 sec to L hand ischemia 9. DM Type 2 - Lantus & SSI  Ramiro Harvest 01/24/2014, 11:32 AM   Attending Nephrologist: Fleet Contras, MD  I have seen and examined this patient and agree with plan per Ramiro Harvest. 73yo BF admitted for CP but tells me her pain was lower Abd?  Pt not a very good historian.  Denies any pain now and eating lunch without problems.  Today is her HD day but since inpt unit is packed and her volume status is OK and K OK, will plan HD tomorrow.  Bennie Dallas 01/24/2014 12:57 PM

## 2014-01-24 NOTE — Progress Notes (Signed)
[facility] PROGRESS NOTE  [name] TIW:[id number] DOB: 05-28-1941 DOA: 01/23/2014 PCP: Dwan Bolt, MD  Assessment/Plan: Chest pain  Denies chest pain today.  Hx of significant CAD. troponins are negative. cardiology evaluated the pt and recommended no cardiac cath  And she is not a candidate for CABG. she has high bleeding risk on dual antiplatelet therapy following PCI.  Also has severe cardiomyopathy but is not a candidate for AICD.   Recommend optimal medical management. Increased dose of imdur. Would add hydralazine. Patient has been allergy to aspirin. -VQ scan with low probability for PE.  . Bilateral visual loss  Painless visual loss, has been going on for at least for the past 5 days. It is possible that visual loss has been ongoing for several days now and progressive. CT head unremarkable. ESR and CRP mildly elevated. - Case discussed with Dr. Patel-ophthalmology on-call, : See the patient this evening.  [name] UTI (lower urinary tract infection)  - Continue with Rocephin, await urine cultures.  . Left little finger gangrene  - History of left hand steal syndrome.  Appears to be dry gangrene. No sign of infection.  . Anemia  - Secondary to end-stage renal disease.  - Monitor hemoglobin and hematocrit, erythropoietin per nephrology.   . ESRD on dialysis  Renal onboard. Hemodialysis today. Continue pectoral and Fosrenol with meals.  . DM type 2, uncontrolled, with renal complications  - Continue Lantus, sliding scale insulin.  . Multifocal atrial tachycardia  Currently stable on telemetry.  . Chronic systolic heart failure  EF of 30-35% on recent echo. Appears compensated. Continue beta blocker, indoor and hydralazine.  . Essential hypertension  - Stable, continue amlodipine, Coreg. Added hydralazine. Dose of Imdur increased  . COPD (chronic obstructive pulmonary disease)  nebulized bronchodilators as needed  . PVD (peripheral vascular  disease)-status post recent right AKA  - Staples in place, no discharge erythema at the stump site.  -Notify vascular for staples removal  . Sacral decubitus stage II-3  Minimal purulent discharge. ESR mildly elevated. On empiric vancomycin. Appreciate wound care evaluation. On santyl dressing   . History of prior VTE  - Recently taken off anticoagulation.  - Review of past medical history shows a history of peroneal vein DVT, however a recent lower extremity Doppler was negative.  -Since VQ scan is negative will not repeat Doppler lower extremity   Patient has significant medical issues with failure to thrive and poor outcome prognosis. Palliative care has been consulted . Patient does not have capacity. Upon discussion with the family by the cardiologist patient does not wish to undergo any other history of resuscitation if the need arises and she has been made DO NOT RESUSCITATE.     Code Status: DO NOT RESUSCITATE Family Communication: Daughter she lives bedside Disposition Plan: Pending patient workup   Consultants:  Cardiology  Ophthalmology  Renal  Wound care  Procedures:  None  Antibiotics:  IV vancomycin and Rocephin  HPI/Subjective: Patient seen and examined. Daughter at bedside. Still reports loss of vision in both eyes.  Objective: Filed Vitals:   01/24/14 1324  BP: 118/84  Pulse: 80  Temp: 97.2 F (36.2 C)  Resp: 20    Intake/Output Summary (Last 24 hours) at 01/24/14 1623 Last data filed at 01/24/14 1027  Gross per 24 hour  Intake    240 ml  Output      0 ml  Net    240 ml   Filed Weights   01/23/14 2018  01/24/14 0500  Weight: 57.38 kg (126 lb 8 oz) 59 kg (130 lb 1.1 oz)    Exam:   General:  Elderly female in NAD  HEENT: no pallor, pupils reactive b/l, moist mucosa  Chest: clear b/l  CVS: NS1&S2, no murmurs,   Abd: soft, N,T N,D BS+  Ext: warm, no edema, rt AKA with clean staples  CNS: AAOX3,   Data Reviewed: Basic  Metabolic Panel:  Recent Labs Lab 01/23/14 1139 01/23/14 2300 01/24/14 0340  NA 137  --  135*  K 4.1  --  4.4  CL 92*  --  92*  CO2 26  --  27  GLUCOSE 208*  --  335*  BUN 21  --  29*  CREATININE 3.73* 4.03* 4.30*  CALCIUM 10.0  --  9.7   Liver Function Tests:  Recent Labs Lab 01/23/14 1139  AST 32  ALT 15  ALKPHOS 134*  BILITOT 0.4  PROT 7.1  ALBUMIN 2.2*   No results found for this basename: LIPASE, AMYLASE,  in the last 168 hours No results found for this basename: AMMONIA,  in the last 168 hours CBC:  Recent Labs Lab 01/23/14 1139 01/23/14 2300 01/24/14 0340  WBC 10.7* 10.6* 9.4  NEUTROABS 8.8*  --   --   HGB 10.9* 10.2* 9.5*  HCT 36.0 32.8* 30.6*  MCV 87.4 85.4 85.5  PLT 430* 403* 377   Cardiac Enzymes:  Recent Labs Lab 01/23/14 1627 01/23/14 2300 01/24/14 0340 01/24/14 1000  TROPONINI <0.30 <0.30 <0.30 <0.30   BNP (last 3 results)  Recent Labs  08/17/13 1603 12/22/13 1809 01/23/14 1140  PROBNP 28521.0* >70000.0* >70000.0*   CBG:  Recent Labs Lab 01/23/14 1646 01/23/14 2252 01/24/14 0728 01/24/14 1234  GLUCAP 228* 211* 245* 191*    Recent Results (from the past 240 hour(s))  URINE CULTURE     Status: None   Collection Time    01/23/14  1:34 PM      Result Value Ref Range Status   Specimen Description URINE, CLEAN CATCH   Final   Special Requests ADD [id number]   Final   Culture  Setup Time     Final   Value: 01/23/2014 17:34     Performed at [facility]     Final   Value: >=100,000 COLONIES/ML     Performed at [facility]   Culture     Final   Value: [place]     Performed at [facility]   Report Status PENDING   Incomplete  MRSA PCR SCREENING     Status: None   Collection Time    01/23/14  9:16 PM      Result Value Ref Range Status   MRSA by PCR NEGATIVE  NEGATIVE Final   Comment:            The GeneXpert MRSA Assay (FDA     approved for NASAL specimens      only), is one component of a     comprehensive MRSA colonization     surveillance program. It is not     intended to diagnose MRSA     infection nor to guide or     monitor treatment for     MRSA infections.     Studies: Ct Head Wo Contrast  01/23/2014   CLINICAL DATA:  Intermittent blindness, altered mental status  EXAM: CT HEAD WITHOUT CONTRAST  TECHNIQUE: Contiguous axial images  were obtained from the base of the skull through the vertex without intravenous contrast.  COMPARISON:  MR HEAD W/O CM dated 01/06/2014; CT HEAD W/O CM dated 01/05/2014  FINDINGS: Calvarium is intact. There is moderate diffuse atrophy and low attenuation in the deep white matter, stable findings consistent with chronic involutional change. There is no evidence of infarct, hemorrhage, or extra-axial fluid.  IMPRESSION: No acute findings.  Chronic involutional change stable.   Electronically Signed   By: Skipper Cliche M.D.   On: 01/23/2014 19:35   Nm Pulmonary Perf And Vent  01/23/2014   CLINICAL DATA:  Chest pain, shortness of breath, evaluate for pulmonary embolism  EXAM: NUCLEAR MEDICINE VENTILATION - PERFUSION LUNG SCAN  TECHNIQUE: Ventilation images were obtained in multiple projections using inhaled aerosol technetium 99 M DTPA. Perfusion images were obtained in multiple projections after intravenous injection of Tc-81mMAA.  RADIOPHARMACEUTICALS:  40 mCi Tc-965mTPA aerosol and 6 mCi Tc-9932mA  COMPARISON:  DG CHEST 1V PORT dated 01/23/2014; DG CHEST 2 VIEW dated 12/22/2013; DG CHEST 2V dated 08/27/2013  FINDINGS: Review of chest radiograph performed earlier same day demonstrates grossly unchanged enlarged cardiac silhouette and mediastinal contours. Post median sternotomy and CABG. Interval placement of right internal jugular approach dialysis catheter. The lungs are hyperexpanded with mild diffuse thickening of the pulmonary interstitium. No pleural effusion or pneumothorax. Mild pulmonary venous congestion without  frank evidence of edema.  Ventilation: There is mild diffuse mottled ventilation of the bilateral pulmonary parenchyma. There is clumping of inhaled radiotracer about the bilateral pulmonary hila, right greater than left. There is ingested radiotracer seen within the oropharynx, hypopharynx and stomach.  Perfusion: There is relative homogeneous distribution of injected radiotracer without discrete segmental or subsegmental mismatched filling defect to suggest pulmonary embolism. A minimal amount of extravasated radiotracer is seen about a right upper extremity IV site.  IMPRESSION: Pulmonary embolism absent (very low probability of pulmonary embolism).   Electronically Signed   By: JohSandi MariscalD.   On: 01/23/2014 23:15   Dg Chest Portable 1 View  01/23/2014   CLINICAL DATA:  Shortness of breath  EXAM: PORTABLE CHEST - 1 VIEW  COMPARISON:  DG CHEST 1V PORT dated 01/09/2014  FINDINGS: Borderline cardiomegaly noted with evidence of CABG. Right IJ approach dual lumen dialysis catheter tip terminates over the distal SVC at possible high right atrium. No focal pulmonary consolidation is identified. Diffusely prominent reticular markings are reidentified with improved aeration since previously.  IMPRESSION: No focal acute cardiopulmonary process.  Cardiomegaly with stable prominence of diffuse reticular markings, nonspecific.   Electronically Signed   By: GreConchita ParisD.   On: 01/23/2014 13:58    Scheduled Meds: . amLODipine  10 mg Oral Daily  . atorvastatin  40 mg Oral Daily  . calcitRIOL  0.5 mcg Oral Daily  . carvedilol  12.5 mg Oral BID WC  . cefTRIAXone (ROCEPHIN)  IV  1 g Intravenous Q24H  . collagenase   Topical Daily  . [START ON 01/25/2014] darbepoetin (ARANESP) injection - DIALYSIS  100 mcg Intravenous Q Wed-HD  . doxercalciferol  2 mcg Intravenous Once  . feeding supplement (NEPRO CARB STEADY)  237 mL Oral TID WC  . ferrous sulfate  325 mg Oral BID  . folic acid  1 mg Oral Daily  . heparin  subcutaneous  5,000 Units Subcutaneous 3 times per day  . hydrALAZINE  25 mg Oral BID  . insulin aspart  0-9 Units Subcutaneous TID WC  .  insulin glargine  10 Units Subcutaneous QHS  . isosorbide mononitrate  30 mg Oral Daily  . lanthanum  500 mg Oral TID WC  . mirtazapine  7.5 mg Oral QHS  . multivitamin  1 tablet Oral QHS  . omega-3 acid ethyl esters  1 g Oral QID  . pantoprazole  40 mg Oral Daily  . sodium chloride  3 mL Intravenous Q12H  . [START ON 01/25/2014] vancomycin  500 mg Intravenous Q T,Th,Sa-HD  . vitamin C  500 mg Oral Daily  . zinc sulfate  220 mg Oral Daily   Continuous Infusions:     Time spent: 25 MINUTES    Timia Casselman  [facility] Pager [phone] If 7PM-7AM, please contact night-coverage at [email or web address], password [facility] 01/24/2014, 4:23 PM  LOS: 1 day

## 2014-01-24 NOTE — Progress Notes (Signed)
INITIAL NUTRITION ASSESSMENT  DOCUMENTATION CODES Per approved criteria  -Non-severe (moderate) malnutrition in the context of chronic illness   INTERVENTION:  Continue Nepro Shake PO TID, each supplement provides 425 kcal and 19 grams protein  NUTRITION DIAGNOSIS: Increased nutrient needs related to wounds as evidenced by estimated nutrition needs.   Goal: Intake to meet >90% of estimated nutrition needs.  Monitor:  PO intake, labs, weight trend.  Reason for Assessment: MST  73 y.o. female  Admitting Dx: Chest pain  ASSESSMENT: Patient is a 73 y.o. female with a past medical history of end-stage renal disease recently started on dialysis, history of peripheral vascular disease status post recent right AKA, diabetes, history of coronary artery disease status post CABG in 2009, and a recent positive nuclear stress test on medical management, hypertension, chronic systolic heart failure who presented to the hospital today from a skilled nursing facility for evaluation of chest pain. During her recent hospitalization (discharge on 01/15/14) patient underwent a right AKA and also was started on hemodialysis.  Per previous hospital notes, patient has not wanted artificial nutrition in the past. She has lost 15% of usual weight within the past month due to recent poor oral intake. Palliative Care Team is following patient.  Nutrition Focused Physical Exam:  Subcutaneous Fat:  Orbital Region: WNL Upper Arm Region: WNL Thoracic and Lumbar Region: WNL  Muscle:  Temple Region: mild depletion Clavicle Bone Region: moderate depletion Clavicle and Acromion Bone Region: moderate depletion Scapular Bone Region: WNL Dorsal Hand: WNL Patellar Region: mild depletion Anterior Thigh Region: moderate depletion Posterior Calf Region: mild depletion  Edema: none  Pt meets criteria for moderate (non-severe) MALNUTRITION in the context of chronic illness as evidenced by intake <75% of estimated  energy requirement for > 1 month with mild depletion of muscle mass.  Height: Ht Readings from Last 1 Encounters:  01/23/14 5\' 3"  (1.6 m)    Weight: Wt Readings from Last 1 Encounters:  01/24/14 130 lb 1.1 oz (59 kg)    Ideal Body Weight: 49.2 kg (adjusted for AKA)  % Ideal Body Weight: 120%  Wt Readings from Last 10 Encounters:  01/24/14 130 lb 1.1 oz (59 kg)  01/15/14 134 lb 0.6 oz (60.8 kg)  01/15/14 134 lb 0.6 oz (60.8 kg)  01/15/14 134 lb 0.6 oz (60.8 kg)  01/15/14 134 lb 0.6 oz (60.8 kg)  12/30/13 153 lb (69.4 kg)  12/22/13 152 lb 9.6 oz (69.219 kg)  12/14/13 153 lb 14.4 oz (69.809 kg)  11/11/13 148 lb (67.132 kg)  10/04/13 139 lb (63.05 kg)    Usual Body Weight: 153 lb (< 1 month ago)  % Usual Body Weight: 85%  BMI:  24.5  Estimated Nutritional Needs: Kcal: 1750-2050 Protein: 85-100 gm Fluid: 1.2 L  Skin: unstageable wounds to sacrum and buttocks; stage 2 wound to inner gluteal fold   Diet Order: CHO-modified  EDUCATION NEEDS: -Education needs addressed  No intake or output data in the 24 hours ending 01/24/14 1012  Last BM: None documented since admission   Labs:   Recent Labs Lab 01/23/14 1139 01/23/14 2300 01/24/14 0340  NA 137  --  135*  K 4.1  --  4.4  CL 92*  --  92*  CO2 26  --  27  BUN 21  --  29*  CREATININE 3.73* 4.03* 4.30*  CALCIUM 10.0  --  9.7  GLUCOSE 208*  --  335*    CBG (last 3)   Recent Labs  01/23/14 1646 01/23/14 2252 01/24/14 0728  GLUCAP 228* 211* 245*    Scheduled Meds: . amLODipine  10 mg Oral Daily  . atorvastatin  40 mg Oral Daily  . calcitRIOL  0.5 mcg Oral Daily  . carvedilol  12.5 mg Oral BID WC  . cefTRIAXone (ROCEPHIN)  IV  1 g Intravenous Q24H  . collagenase   Topical Daily  . feeding supplement (NEPRO CARB STEADY)  237 mL Oral TID WC  . ferrous sulfate  325 mg Oral BID  . folic acid  1 mg Oral Daily  . heparin subcutaneous  5,000 Units Subcutaneous 3 times per day  . hydrALAZINE  25 mg  Oral BID  . insulin aspart  0-9 Units Subcutaneous TID WC  . insulin glargine  10 Units Subcutaneous QHS  . isosorbide mononitrate  30 mg Oral Daily  . lanthanum  500 mg Oral TID WC  . mirtazapine  7.5 mg Oral QHS  . omega-3 acid ethyl esters  1 g Oral QID  . pantoprazole  40 mg Oral Daily  . sodium chloride  3 mL Intravenous Q12H  . vancomycin  500 mg Intravenous Q M,W,F-HD  . vitamin C  500 mg Oral Daily  . zinc sulfate  220 mg Oral Daily    Continuous Infusions:   Past Medical History  Diagnosis Date  . CAD (coronary artery disease)   . Hypertension   . Diabetes mellitus     type 2 insulin dependent  . Hyperlipidemia   . CHF (congestive heart failure)   . Cellulitis     left foot  . Ulcer     diabetic ulcer on left foot  . DVT (deep venous thrombosis)     peroneal vein  . Peripheral vascular disease   . Chronic kidney disease   . Anemia   . Shortness of breath     with fluid  . Insomnia     Past Surgical History  Procedure Laterality Date  . Tubal ligation    . Pr vein bypass graft,aorto-fem-pop  06-26-11    1. PATENT LEFT FEMORAL-POPLITEAL BYPASS GRAFT W/NO EVEIDENCE OF STENOSIS. 2.VELOCITIES OF GREATER THAN 200 CM/'s NOTED ON PREVIOUS EXAM 10/03/11 WERE NOT ADEQUATELY VISULAIZED DURING THIS EXAM  . Coronary artery bypass graft  04/25/08    Dr. Tyrone SageGerhardt, CABG x 5   . Nm myoview ltd  01/13/13    LEXISCAN; LV WALL MOTION: LVEF 44%, INFERIOR AKINESIS, ANTEROAPICAL HYPOKINESIS  . Cardiac catheterization  04/22/08    SEVERE 3-VESSEL DISEASE CAD., CABG X 5 04/25/08 WITH DR. GNFAOZHYGERHARDT  . Av fistula placement Left 09/07/2013    Procedure: ARTERIOVENOUS (AV) FISTULA CREATION;  Surgeon: Sherren Kernsharles E Fields, MD;  [facility];  Service: Vascular;  Laterality: Left;  . Ligation arteriovenous gortex graft Left 01/09/2014    Procedure: LIGATION ARTERIOVENOUS GORTEX GRAFT;  Surgeon: Sherren Kernsharles E Fields, MD;  [facility];  Service: Vascular;  Laterality: Left;  . Insertion of  dialysis catheter Right 01/09/2014    Procedure: INSERTION OF DIALYSIS CATHETER;  Surgeon: Sherren Kernsharles E Fields, MD;  [facility];  Service: Vascular;  Laterality: Right;  Insertion of diatek to right internal Jugular artery.  . Amputation Right 01/12/2014    Procedure: AMPUTATION ABOVE KNEE- RIGHT;  Surgeon: Pryor OchoaJames D Lawson, MD;  [facility];  Service: Vascular;  Laterality: Right;    Joaquin CourtsKimberly Harris, RD, LDN, CNSC Pager [phone] After Hours Pager [phone]

## 2014-01-24 NOTE — Progress Notes (Signed)
Palliative has received consult and has worked extensively with patient on recent admission. Will see as soon as provider available to further evaluate goals of care.   Yong Channel, NP Palliative Medicine Team Pager # [phone] (M-F 8a-5p) Team Phone # [phone] (Nights/Weekends)

## 2014-01-24 NOTE — Progress Notes (Addendum)
ANTICOAGULATION CONSULT NOTE - Follow Up Consult  Pharmacy Consult for heparin Indication: r/o PE/DVT  Labs:  Recent Labs  01/23/14 1139 01/23/14 1627 01/23/14 2300 01/24/14 0340  HGB 10.9*  --  10.2* 9.5*  HCT 36.0  --  32.8* 30.6*  PLT 430*  --  403* 377  HEPARINUNFRC  --   --   --  <0.10*  CREATININE 3.73*  --  4.03*  --   TROPONINI  --  <0.30 <0.30 <0.30    Assessment: 73yo female undetectable on heparin with initial dosing for possible PE/DVT; VQ scan reveals very low probability of PE, awaiting Doppler; per RN IV line was beeping a lot through the night, now w/ new site.  Goal of Therapy:  Heparin level 0.3-0.5 units/ml   Plan:  Will increase heparin gtt by 4 units/kg/hr to 900 units/hr (was previously therapeutic at this rate) and check level in 8hr.  Vernard Gambles, PharmD, BCPS  01/24/2014,4:45 AM

## 2014-01-24 NOTE — Consult Note (Addendum)
WOC wound consult note Reason for Consult: Consult requested for buttock and sacrum wounds.  Pt states "they have been there awhile but I think they are getting worse since they really hurt now." She admits that she has not been eating recently and is immobile. Wound type: Unstageable wounds to sacrum and buttocks Pressure Ulcer POA: Yes Measurement: Sacrum unstageable, 100% soft eschar; 4X3.5cm Left buttock unstageable, 100% soft eschar, 2X2cm Right buttock unstageable, 100% soft eschar, 1X.3cm Inner gluteal fold stage 2 wound .3X.5X.1cm, pink and moist Dressing procedure/placement/frequency: If aggressive plan of care is desired, RECOMMEND SURGICAL CONSULT for debridement of nonviable tissue.  Santyl ointment to chemically debride nonviable tissue.  If comfort care is desired, then this topical treatment can be continued.  Air mattress replacement to reduce pressure.  Optimize nutrition. Please re-consult if further assistance is needed.  Thank-you,  Cammie Mcgee MSN, RN, CWOCN, [place], CNS [phone]

## 2014-01-24 NOTE — Progress Notes (Signed)
DAILY PROGRESS NOTE  Subjective:  [name] is a 73 year old female with a history of CABG in 2009, dyslipidemia, hypertension, and diabetes. Last summer, she had been having some increasing shortness of breath and underwent a MET-TEST, which was a poor effort, but her VO2 was markedly reduced at 29%. It is difficult, therefore, to interpret the test; however, the reduced VO2 is concerning. There was a slight reduction in diffusion capacity in her PFTs and some mild obstruction, and I think COPD probably plays a role in things. Recently, though, she has been more short of breath with elevated heart rate. She has had pressure in her chest and difficulty when doing activities. She underwent a stress test in our office to further characterize this, which was a [place] nuclear stress test, performed on January 13, 2013. This was interpreted as a high-risk study. There were scattered PVCs during the study. EF was 44% with inferior akinesis and anteroapical hypokinesis. There was a reversible defect in the mid and distal anterior wall and apex consistent with ischemia, and a fixed defect inferiorly consistent with scar. Based on these findings, cardiac catheterization was recommended, however, he renal function did not support this.   She now presents with recurrent chest pain. Since I last saw her 1 year ago, she has gone on dialysis. She also has had vision loss, I suspect, related to hypertension. 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  Objective:  Temp:  [98 F (36.7 C)-99.3 F (37.4 C)] 98 F (36.7 C) (04/13 0504) Pulse Rate:  [76-88] 80 (04/13 0504) Resp:  [12-29] 18 (04/13 0504) BP: (125-147)/(54-79) 147/65 mmHg (04/13 0504) SpO2:  [94 %-100 %] 96 % (04/13 0504) Weight:  [126 lb 8 oz (57.38 kg)-130 lb 1.1 oz (59 kg)] 130 lb 1.1 oz (59 kg) (04/13 0500) Weight change:   Intake/Output from previous day:    Intake/Output from this  shift:    Medications: Current Facility-Administered Medications  Medication Dose Route Frequency Provider Last Rate Last Dose  . 0.9 %  sodium chloride infusion  250 mL Intravenous PRN Jonetta Osgood, MD 10 mL/hr at 01/23/14 2311 250 mL at 01/23/14 2311  . acetaminophen (TYLENOL) tablet 650 mg  650 mg Oral Q6H PRN Jonetta Osgood, MD   650 mg at 01/24/14 0522   Or  . acetaminophen (TYLENOL) suppository 650 mg  650 mg Rectal Q6H PRN Shanker Kristeen Mans, MD      . albuterol (PROVENTIL) (2.5 MG/3ML) 0.083% nebulizer solution 2.5 mg  2.5 mg Nebulization Q2H PRN Shanker Kristeen Mans, MD      . alum & mag hydroxide-simeth (MAALOX/MYLANTA) 200-200-20 MG/5ML suspension 30 mL  30 mL Oral Q6H PRN Shanker Kristeen Mans, MD      . amLODipine (NORVASC) tablet 10 mg  10 mg Oral Daily Shanker Kristeen Mans, MD      . atorvastatin (LIPITOR) tablet 40 mg  40 mg Oral Daily Shanker Kristeen Mans, MD      . calcitRIOL (ROCALTROL) capsule 0.5 mcg  0.5 mcg Oral Daily Shanker Kristeen Mans, MD      . carvedilol (COREG) tablet 12.5 mg  12.5 mg Oral BID WC Shanker Kristeen Mans, MD      . cefTRIAXone (ROCEPHIN) 1 g in dextrose 5 % 50 mL IVPB  1 g Intravenous Q24H Shanker Kristeen Mans, MD      . diphenhydrAMINE (BENADRYL) tablet 25 mg  25 mg Oral Q6H PRN Jonetta Osgood, MD      . feeding supplement (NEPRO CARB STEADY) liquid 237 mL  237 mL Oral TID WC Shanker Kristeen Mans, MD      . ferrous sulfate tablet 325 mg  325 mg Oral BID Jonetta Osgood, MD   325 mg at 45/62/56 3893  . folic acid (FOLVITE) tablet 1 mg  1 mg Oral Daily Shanker Kristeen Mans, MD      . guaiFENesin-dextromethorphan (ROBITUSSIN DM) 100-10 MG/5ML syrup 5 mL  5 mL Oral Q4H PRN Shanker Kristeen Mans, MD      . heparin injection 5,000 Units  5,000 Units Subcutaneous 3 times per day Thurnell Lose, MD      . insulin aspart (novoLOG) injection 0-9 Units  0-9 Units Subcutaneous TID WC Shanker Kristeen Mans, MD      . insulin glargine (LANTUS) injection 10 Units  10 Units Subcutaneous  QHS Jonetta Osgood, MD   10 Units at 01/23/14 2121  . isosorbide mononitrate (IMDUR) 24 hr tablet 15 mg  15 mg Oral Daily Shanker Kristeen Mans, MD      . lanthanum [facility]) chewable tablet 500 mg  500 mg Oral TID WC Shanker Kristeen Mans, MD      . mirtazapine (REMERON) tablet 7.5 mg  7.5 mg Oral QHS Jonetta Osgood, MD   7.5 mg at 01/23/14 2116  . nitroGLYCERIN (NITROSTAT) SL tablet 0.4 mg  0.4 mg Sublingual Q5 min PRN Jonetta Osgood, MD      . omega-3 acid ethyl esters (LOVAZA) capsule 1 g  1 g Oral QID Jonetta Osgood, MD   1 g at 01/23/14 2116  . ondansetron (ZOFRAN) tablet 4 mg  4 mg Oral Q6H PRN Shanker Kristeen Mans, MD       Or  . ondansetron [facility]) injection 4 mg  4 mg Intravenous Q6H PRN Shanker Kristeen Mans, MD      . oxyCODONE (Oxy IR/ROXICODONE) immediate release tablet 5 mg  5 mg Oral Q4H PRN Jonetta Osgood, MD   5 mg at 01/24/14 0522  . pantoprazole (PROTONIX) EC tablet 40 mg  40 mg Oral Daily Shanker Kristeen Mans, MD      . sodium chloride 0.9 % injection 3 mL  3 mL Intravenous Q12H Jonetta Osgood, MD   3 mL at 01/23/14 2117  . sodium chloride 0.9 % injection 3 mL  3 mL Intravenous PRN Shanker Kristeen Mans, MD      . vancomycin (VANCOCIN) 500 mg in sodium chloride 0.9 % 100 mL IVPB  500 mg Intravenous Q M,W,F-HD Shanker Kristeen Mans, MD      . vitamin C (ASCORBIC ACID) tablet 500 mg  500 mg Oral Daily Shanker Kristeen Mans, MD      . zinc sulfate capsule 220 mg  220 mg Oral Daily Shanker Kristeen Mans, MD        Physical Exam: General appearance: alert and no distress Neck: no carotid bruit and no JVD Lungs: clear to auscultation bilaterally and right tunneled IJ dialysis cath Heart: regular rate and rhythm Abdomen: soft, non-tender; bowel sounds normal; no masses,  no organomegaly Extremities: right BKA, dry ganrene of the left 5th finger, right forearm fistula Pulses: poor pulses Skin: dry gangrene of the 5th digit on left hand Neurologic: Mental status: Alert, oriented, thought  content appropriate, she subjectively reports decreased vision] Psych: Pleasant  Lab Results: Results for orders placed during the hospital encounter of  01/23/14 (from the past 48 hour(s))  CBC WITH DIFFERENTIAL     Status: Abnormal   Collection Time    01/23/14 11:39 AM      Result Value Ref Range   WBC 10.7 (*) 4.0 - 10.5 K/uL   RBC 4.12  3.87 - 5.11 MIL/uL   Hemoglobin 10.9 (*) 12.0 - 15.0 g/dL   HCT 36.0  36.0 - 46.0 %   MCV 87.4  78.0 - 100.0 fL   MCH 26.5  26.0 - 34.0 pg   MCHC 30.3  30.0 - 36.0 g/dL   RDW 18.0 (*) 11.5 - 15.5 %   Platelets 430 (*) 150 - 400 K/uL   Neutrophils Relative % 82 (*) 43 - 77 %   Neutro Abs 8.8 (*) 1.7 - 7.7 K/uL   Lymphocytes Relative 10 (*) 12 - 46 %   Lymphs Abs 1.1  0.7 - 4.0 K/uL   Monocytes Relative 6  3 - 12 %   Monocytes Absolute 0.6  0.1 - 1.0 K/uL   Eosinophils Relative 2  0 - 5 %   Eosinophils Absolute 0.2  0.0 - 0.7 K/uL   Basophils Relative 0  0 - 1 %   Basophils Absolute 0.0  0.0 - 0.1 K/uL  COMPREHENSIVE METABOLIC PANEL     Status: Abnormal   Collection Time    01/23/14 11:39 AM      Result Value Ref Range   Sodium 137  137 - 147 mEq/L   Potassium 4.1  3.7 - 5.3 mEq/L   Chloride 92 (*) 96 - 112 mEq/L   CO2 26  19 - 32 mEq/L   Glucose, Bld 208 (*) 70 - 99 mg/dL   BUN 21  6 - 23 mg/dL   Creatinine, Ser 3.73 (*) 0.50 - 1.10 mg/dL   Calcium 10.0  8.4 - 10.5 mg/dL   Total Protein 7.1  6.0 - 8.3 g/dL   Albumin 2.2 (*) 3.5 - 5.2 g/dL   AST 32  0 - 37 U/L   Comment: HEMOLYSIS AT THIS LEVEL MAY AFFECT RESULT   ALT 15  0 - 35 U/L   Alkaline Phosphatase 134 (*) 39 - 117 U/L   Total Bilirubin 0.4  0.3 - 1.2 mg/dL   GFR calc non Af Amer 11 (*) >90 mL/min   GFR calc Af Amer 13 (*) >90 mL/min   Comment: (NOTE)     The eGFR has been calculated using the CKD EPI equation.     This calculation has not been validated in all clinical situations.     eGFR's persistently <90 mL/min signify possible Chronic Kidney     Disease.  PRO B  NATRIURETIC PEPTIDE     Status: Abnormal   Collection Time    01/23/14 11:40 AM      Result Value Ref Range   Pro B Natriuretic peptide (BNP) >70000.0 (*) 0 - 125 pg/mL  I-STAT TROPOININ, ED     Status: None   Collection Time    01/23/14 12:05 PM      Result Value Ref Range   Troponin i, poc 0.05  0.00 - 0.08 ng/mL   Comment 3            Comment: Due to the release kinetics of cTnI,     a negative result within the first hours     of the onset of symptoms does not rule out     myocardial infarction with certainty.  If myocardial infarction is still suspected,     repeat the test at appropriate intervals.  URINALYSIS, ROUTINE W REFLEX MICROSCOPIC     Status: Abnormal   Collection Time    01/23/14  1:34 PM      Result Value Ref Range   Color, Urine YELLOW  YELLOW   APPearance TURBID (*) CLEAR   Specific Gravity, Urine 1.021  1.005 - 1.030   pH 6.0  5.0 - 8.0   Glucose, UA NEGATIVE  NEGATIVE mg/dL   Hgb urine dipstick LARGE (*) NEGATIVE   Bilirubin Urine NEGATIVE  NEGATIVE   Ketones, ur NEGATIVE  NEGATIVE mg/dL   Protein, ur >300 (*) NEGATIVE mg/dL   Urobilinogen, UA 0.2  0.0 - 1.0 mg/dL   Nitrite NEGATIVE  NEGATIVE   Leukocytes, UA LARGE (*) NEGATIVE  URINE MICROSCOPIC-ADD ON     Status: Abnormal   Collection Time    01/23/14  1:34 PM      Result Value Ref Range   Squamous Epithelial / LPF RARE  RARE   WBC, UA TOO NUMEROUS TO COUNT  <3 WBC/hpf   RBC / HPF 3-6  <3 RBC/hpf   Bacteria, UA MANY (*) RARE  TROPONIN I     Status: None   Collection Time    01/23/14  4:27 PM      Result Value Ref Range   Troponin I <0.30  <0.30 ng/mL   Comment:            Due to the release kinetics of cTnI,     a negative result within the first hours     of the onset of symptoms does not rule out     myocardial infarction with certainty.     If myocardial infarction is still suspected,     repeat the test at appropriate intervals.  CBG MONITORING, ED     Status: Abnormal   Collection  Time    01/23/14  4:46 PM      Result Value Ref Range   Glucose-Capillary 228 (*) 70 - 99 mg/dL  D-DIMER, QUANTITATIVE     Status: Abnormal   Collection Time    01/23/14  4:57 PM      Result Value Ref Range   D-Dimer, Quant 1.84 (*) 0.00 - 0.48 ug/mL-FEU   Comment:            AT THE INHOUSE ESTABLISHED CUTOFF     VALUE OF 0.48 ug/mL FEU,     THIS ASSAY HAS BEEN DOCUMENTED     IN THE LITERATURE TO HAVE     A SENSITIVITY AND NEGATIVE     PREDICTIVE VALUE OF AT LEAST     98 TO 99%.  THE TEST RESULT     SHOULD BE CORRELATED WITH     AN ASSESSMENT OF THE CLINICAL     PROBABILITY OF DVT / VTE.  SEDIMENTATION RATE     Status: Abnormal   Collection Time    01/23/14  6:36 PM      Result Value Ref Range   Sed Rate 41 (*) 0 - 22 mm/hr  MRSA PCR SCREENING     Status: None   Collection Time    01/23/14  9:16 PM      Result Value Ref Range   MRSA by PCR NEGATIVE  NEGATIVE   Comment:            The GeneXpert MRSA Assay (FDA     approved for NASAL specimens  only), is one component of a     comprehensive MRSA colonization     surveillance program. It is not     intended to diagnose MRSA     infection nor to guide or     monitor treatment for     MRSA infections.  GLUCOSE, CAPILLARY     Status: Abnormal   Collection Time    01/23/14 10:52 PM      Result Value Ref Range   Glucose-Capillary 211 (*) 70 - 99 mg/dL  CBC     Status: Abnormal   Collection Time    01/23/14 11:00 PM      Result Value Ref Range   WBC 10.6 (*) 4.0 - 10.5 K/uL   RBC 3.84 (*) 3.87 - 5.11 MIL/uL   Hemoglobin 10.2 (*) 12.0 - 15.0 g/dL   HCT 32.8 (*) 36.0 - 46.0 %   MCV 85.4  78.0 - 100.0 fL   MCH 26.6  26.0 - 34.0 pg   MCHC 31.1  30.0 - 36.0 g/dL   RDW 17.7 (*) 11.5 - 15.5 %   Platelets 403 (*) 150 - 400 K/uL  CREATININE, SERUM     Status: Abnormal   Collection Time    01/23/14 11:00 PM      Result Value Ref Range   Creatinine, Ser 4.03 (*) 0.50 - 1.10 mg/dL   GFR calc non Af Amer 10 (*) >90 mL/min     GFR calc Af Amer 12 (*) >90 mL/min   Comment: (NOTE)     The eGFR has been calculated using the CKD EPI equation.     This calculation has not been validated in all clinical situations.     eGFR's persistently <90 mL/min signify possible Chronic Kidney     Disease.  TROPONIN I     Status: None   Collection Time    01/23/14 11:00 PM      Result Value Ref Range   Troponin I <0.30  <0.30 ng/mL   Comment:            Due to the release kinetics of cTnI,     a negative result within the first hours     of the onset of symptoms does not rule out     myocardial infarction with certainty.     If myocardial infarction is still suspected,     repeat the test at appropriate intervals.  SEDIMENTATION RATE     Status: Abnormal   Collection Time    01/23/14 11:00 PM      Result Value Ref Range   Sed Rate 44 (*) 0 - 22 mm/hr  BASIC METABOLIC PANEL     Status: Abnormal   Collection Time    01/24/14  3:40 AM      Result Value Ref Range   Sodium 135 (*) 137 - 147 mEq/L   Potassium 4.4  3.7 - 5.3 mEq/L   Chloride 92 (*) 96 - 112 mEq/L   CO2 27  19 - 32 mEq/L   Glucose, Bld 335 (*) 70 - 99 mg/dL   BUN 29 (*) 6 - 23 mg/dL   Creatinine, Ser 4.30 (*) 0.50 - 1.10 mg/dL   Calcium 9.7  8.4 - 10.5 mg/dL   GFR calc non Af Amer 9 (*) >90 mL/min   GFR calc Af Amer 11 (*) >90 mL/min   Comment: (NOTE)     The eGFR has been calculated using the CKD EPI equation.  This calculation has not been validated in all clinical situations.     eGFR's persistently <90 mL/min signify possible Chronic Kidney     Disease.  CBC     Status: Abnormal   Collection Time    01/24/14  3:40 AM      Result Value Ref Range   WBC 9.4  4.0 - 10.5 K/uL   RBC 3.58 (*) 3.87 - 5.11 MIL/uL   Hemoglobin 9.5 (*) 12.0 - 15.0 g/dL   HCT 30.6 (*) 36.0 - 46.0 %   MCV 85.5  78.0 - 100.0 fL   MCH 26.5  26.0 - 34.0 pg   MCHC 31.0  30.0 - 36.0 g/dL   RDW 17.8 (*) 11.5 - 15.5 %   Platelets 377  150 - 400 K/uL  TROPONIN I      Status: None   Collection Time    01/24/14  3:40 AM      Result Value Ref Range   Troponin I <0.30  <0.30 ng/mL   Comment:            Due to the release kinetics of cTnI,     a negative result within the first hours     of the onset of symptoms does not rule out     myocardial infarction with certainty.     If myocardial infarction is still suspected,     repeat the test at appropriate intervals.  HEPARIN LEVEL (UNFRACTIONATED)     Status: Abnormal   Collection Time    01/24/14  3:40 AM      Result Value Ref Range   Heparin Unfractionated <0.10 (*) 0.30 - 0.70 IU/mL   Comment:            IF HEPARIN RESULTS ARE BELOW     EXPECTED VALUES, AND PATIENT     DOSAGE HAS BEEN CONFIRMED,     SUGGEST FOLLOW UP TESTING     OF ANTITHROMBIN III LEVELS.    Imaging: Ct Head Wo Contrast  01/23/2014   CLINICAL DATA:  Intermittent blindness, altered mental status  EXAM: CT HEAD WITHOUT CONTRAST  TECHNIQUE: Contiguous axial images were obtained from the base of the skull through the vertex without intravenous contrast.  COMPARISON:  MR HEAD W/O CM dated 01/06/2014; CT HEAD W/O CM dated 01/05/2014  FINDINGS: Calvarium is intact. There is moderate diffuse atrophy and low attenuation in the deep white matter, stable findings consistent with chronic involutional change. There is no evidence of infarct, hemorrhage, or extra-axial fluid.  IMPRESSION: No acute findings.  Chronic involutional change stable.   Electronically Signed   By: Skipper Cliche M.D.   On: 01/23/2014 19:35   Nm Pulmonary Perf And Vent  01/23/2014   CLINICAL DATA:  Chest pain, shortness of breath, evaluate for pulmonary embolism  EXAM: NUCLEAR MEDICINE VENTILATION - PERFUSION LUNG SCAN  TECHNIQUE: Ventilation images were obtained in multiple projections using inhaled aerosol technetium 99 M DTPA. Perfusion images were obtained in multiple projections after intravenous injection of Tc-78mMAA.  RADIOPHARMACEUTICALS:  40 mCi [id number] aerosol  and 6 mCi Tc-9945mA  COMPARISON:  DG CHEST 1V PORT dated 01/23/2014; DG CHEST 2 VIEW dated 12/22/2013; DG CHEST 2V dated 08/27/2013  FINDINGS: Review of chest radiograph performed earlier same day demonstrates grossly unchanged enlarged cardiac silhouette and mediastinal contours. Post median sternotomy and CABG. Interval placement of right internal jugular approach dialysis catheter. The lungs are hyperexpanded with mild diffuse thickening of the pulmonary interstitium. No  pleural effusion or pneumothorax. Mild pulmonary venous congestion without frank evidence of edema.  Ventilation: There is mild diffuse mottled ventilation of the bilateral pulmonary parenchyma. There is clumping of inhaled radiotracer about the bilateral pulmonary hila, right greater than left. There is ingested radiotracer seen within the oropharynx, hypopharynx and stomach.  Perfusion: There is relative homogeneous distribution of injected radiotracer without discrete segmental or subsegmental mismatched filling defect to suggest pulmonary embolism. A minimal amount of extravasated radiotracer is seen about a right upper extremity IV site.  IMPRESSION: Pulmonary embolism absent (very low probability of pulmonary embolism).   Electronically Signed   By: Sandi Mariscal M.D.   On: 01/23/2014 23:15   Dg Chest Portable 1 View  01/23/2014   CLINICAL DATA:  Shortness of breath  EXAM: PORTABLE CHEST - 1 VIEW  COMPARISON:  DG CHEST 1V PORT dated 01/09/2014  FINDINGS: Borderline cardiomegaly noted with evidence of CABG. Right IJ approach dual lumen dialysis catheter tip terminates over the distal SVC at possible high right atrium. No focal pulmonary consolidation is identified. Diffusely prominent reticular markings are reidentified with improved aeration since previously.  IMPRESSION: No focal acute cardiopulmonary process.  Cardiomegaly with stable prominence of diffuse reticular markings, nonspecific.   Electronically Signed   By: Conchita Paris M.D.    On: 01/23/2014 13:58    Assessment:  1. Principal Problem: 2.   Chest pain 3. Active Problems: 4.   PVD (peripheral vascular disease) 5.   S/P CABG x 5 6.   Chronic systolic heart failure 7.   COPD (chronic obstructive pulmonary disease) 8.   Essential hypertension 9.   Anemia 10.   DM type 2, uncontrolled, with renal complications 11.   ESRD on dialysis 12.   Multifocal atrial tachycardia 13.   UTI (lower urinary tract infection) 14.   Visual loss, bilateral 15.   Plan:  1. I had a discussion with Mrs. Googe about her health today and my feeling is that cardiac catheterization is not indicated at this point.  She is not ambulatory from her amputation. Her activity level is minimal. She has very infrequent chest pain episodes. Cardiac enzymes have been negative. She is not a CABG candidate. I would worry about her bleeding risk on DAPT if she were to get PCI, especially on dialysis or the need for interruption of therapy if she needed amputation of her left 5th finger (which may auto-amputate anyway).  She wants to take a conservative approach as well. EF from 01/02/14 shows further reduction in EF to 30-35%, with inferior and lateral wall motion abnormalities.  This may suggest a patent LIMA as anterior wall motion is preserved. I suspect the other grafts are closed or severely diseased. Given a higher percentage of mortality in the next 1-2 years, she is not an AICD candidate. 2.   I would recommend increasing her imdur to 30 mg daily.   3.   Add hydralazine for afterload reduction and BP benefit. 4.   We discussed DNR and she was clear that does not want any heroic resuscitation attempts.  DNR/DNI  Time Spent Directly with Patient:  15 minutes  Length of Stay:  LOS: 1 day   Pixie Casino, MD, [facility] Attending Cardiologist [place] 01/24/2014, 8:30 AM

## 2014-01-25 ENCOUNTER — Other Ambulatory Visit: Payer: Self-pay

## 2014-01-25 ENCOUNTER — Encounter (HOSPITAL_COMMUNITY): Payer: Self-pay | Admitting: *Deleted

## 2014-01-25 DIAGNOSIS — E11359 Type 2 diabetes mellitus with proliferative diabetic retinopathy without macular edema: Secondary | ICD-10-CM

## 2014-01-25 DIAGNOSIS — I5023 Acute on chronic systolic (congestive) heart failure: Secondary | ICD-10-CM

## 2014-01-25 DIAGNOSIS — H543 Unqualified visual loss, both eyes: Secondary | ICD-10-CM | POA: Diagnosis present

## 2014-01-25 DIAGNOSIS — E1139 Type 2 diabetes mellitus with other diabetic ophthalmic complication: Secondary | ICD-10-CM

## 2014-01-25 DIAGNOSIS — I509 Heart failure, unspecified: Secondary | ICD-10-CM

## 2014-01-25 DIAGNOSIS — E113599 Type 2 diabetes mellitus with proliferative diabetic retinopathy without macular edema, unspecified eye: Secondary | ICD-10-CM | POA: Diagnosis present

## 2014-01-25 LAB — RENAL FUNCTION PANEL
Albumin: 1.9 g/dL — ABNORMAL LOW (ref 3.5–5.2)
BUN: 41 mg/dL — ABNORMAL HIGH (ref 6–23)
CO2: 28 meq/L (ref 19–32)
CREATININE: 5.02 mg/dL — AB (ref 0.50–1.10)
Calcium: 9.8 mg/dL (ref 8.4–10.5)
Chloride: 93 mEq/L — ABNORMAL LOW (ref 96–112)
GFR, EST AFRICAN AMERICAN: 9 mL/min — AB (ref >90)
GFR, EST NON AFRICAN AMERICAN: 8 mL/min — AB (ref >90)
GLUCOSE: 89 mg/dL (ref 70–99)
Phosphorus: 2.2 mg/dL — ABNORMAL LOW (ref 2.3–4.6)
Potassium: 4 mEq/L (ref 3.7–5.3)
SODIUM: 137 meq/L (ref 137–147)

## 2014-01-25 LAB — CBC
HEMATOCRIT: 30.5 % — AB (ref 36.0–46.0)
Hemoglobin: 9.4 g/dL — ABNORMAL LOW (ref 12.0–15.0)
MCH: 26 pg (ref 26.0–34.0)
MCHC: 30.8 g/dL (ref 30.0–36.0)
MCV: 84.3 fL (ref 78.0–100.0)
PLATELETS: 370 10*3/uL (ref 150–400)
RBC: 3.62 MIL/uL — ABNORMAL LOW (ref 3.87–5.11)
RDW: 17.6 % — AB (ref 11.5–15.5)
WBC: 8.8 10*3/uL (ref 4.0–10.5)

## 2014-01-25 LAB — URINE CULTURE

## 2014-01-25 LAB — GLUCOSE, CAPILLARY
GLUCOSE-CAPILLARY: 123 mg/dL — AB (ref 70–99)
GLUCOSE-CAPILLARY: 101 mg/dL — AB (ref 70–99)
Glucose-Capillary: 82 mg/dL (ref 70–99)
Glucose-Capillary: 95 mg/dL (ref 70–99)

## 2014-01-25 MED ORDER — HEPARIN SODIUM (PORCINE) 1000 UNIT/ML DIALYSIS
1800.0000 [IU] | Freq: Once | INTRAMUSCULAR | Status: DC
  Filled 2014-01-25: qty 2

## 2014-01-25 MED ORDER — LIDOCAINE HCL (PF) 1 % IJ SOLN
5.0000 mL | INTRAMUSCULAR | Status: DC | PRN
  Filled 2014-01-25: qty 5

## 2014-01-25 MED ORDER — CIPROFLOXACIN HCL 500 MG PO TABS
500.0000 mg | ORAL_TABLET | ORAL | Status: DC
  Administered 2014-01-25 – 2014-01-29 (×5): 500 mg via ORAL
  Filled 2014-01-25 (×6): qty 1

## 2014-01-25 MED ORDER — HEPARIN SODIUM (PORCINE) 1000 UNIT/ML DIALYSIS
1000.0000 [IU] | INTRAMUSCULAR | Status: DC | PRN
  Filled 2014-01-25: qty 1

## 2014-01-25 MED ORDER — NEPRO/CARBSTEADY PO LIQD
237.0000 mL | ORAL | Status: DC | PRN
  Filled 2014-01-25: qty 237

## 2014-01-25 MED ORDER — CLOPIDOGREL BISULFATE 75 MG PO TABS
75.0000 mg | ORAL_TABLET | Freq: Every day | ORAL | Status: DC
  Administered 2014-01-25 – 2014-02-02 (×8): 75 mg via ORAL
  Filled 2014-01-25 (×8): qty 1

## 2014-01-25 MED ORDER — LIDOCAINE-PRILOCAINE 2.5-2.5 % EX CREA: 1.0000 "application " | TOPICAL_CREAM | CUTANEOUS | Status: DC | PRN

## 2014-01-25 MED ORDER — SODIUM CHLORIDE 0.9 % IV SOLN
100.0000 mL | INTRAVENOUS | Status: DC | PRN
Start: 2014-01-25 — End: 2014-01-29

## 2014-01-25 MED ORDER — INSULIN GLARGINE 100 UNIT/ML ~~LOC~~ SOLN
15.0000 [IU] | Freq: Every day | SUBCUTANEOUS | Status: DC
  Filled 2014-01-25 (×2): qty 0.15

## 2014-01-25 MED ORDER — ALTEPLASE 2 MG IJ SOLR
2.0000 mg | Freq: Once | INTRAMUSCULAR | Status: AC | PRN
Start: 2014-01-25 — End: 2014-01-25
  Filled 2014-01-25: qty 2

## 2014-01-25 MED ORDER — SODIUM CHLORIDE 0.9 % IV SOLN: 100.0000 mL | INTRAVENOUS | Status: DC | PRN

## 2014-01-25 MED ORDER — PENTAFLUOROPROP-TETRAFLUOROETH EX AERO: 1.0000 "application " | INHALATION_SPRAY | CUTANEOUS | Status: DC | PRN

## 2014-01-25 NOTE — Progress Notes (Signed)
Discussed with patient and her daughter at bedside. Cardiology consult at bedside as well. Patient appears well oriented and wishes not to undergo further dialysis. She understands for long-term prognosis regarding her multiple comorbidities and would like to stay home with her family. Daughter agrees with her wishes and does not want any form of resuscitation and looks forward to keep her comfortable with him for home hospice. She agrees with the patient's wishes for no further dialysis and is aware about poor survival and prognosis without dialysis. Patient's daughter would like to continue current medications and looks forward to ED with palliative care likely tomorrow to discuss goals of care further.  I will discontinue telemetry.

## 2014-01-25 NOTE — Progress Notes (Signed)
[facility] PROGRESS NOTE  [name] QMG:[id number] DOB: 04-01-1941 DOA: 01/23/2014 PCP: Rhonda Bolt, MD  Assessment/Plan: Chest pain  Denies further pain.[name]  Hx of significant CAD. troponins are negative. cardiology evaluated the pt and recommended not a candidate  cardiac cath and she is not a candidate for CABG. she has high bleeding risk on dual antiplatelet therapy following PCI.  Also has severe cardiomyopathy but is not a candidate for AICD Given co morbidities and poor long term prognosis..   Recommend optimal medical management. Increased dose of imdur. added hydralazine.  continue coreg. Patient has been allergy to aspirin. Will start her on plavix. -VQ scan with low probability for PE.  . Bilateral visual loss  Painless visual loss, has been going on for at least for the past 5 days. Seen by opthalmology Dr Posey Pronto and suggests findings of background proliferative retinopathy. opthalmology will consult retina specialist to evaluate. Her. CT head unremarkable. ESR and CRP mildly elevated. -needs tighter blood glucose control. See below. -  . UTI (lower urinary tract infection)  - Continue with Rocephin, await urine cultures.( >100k colonies)  . Left little finger gangrene  - History of left hand steal syndrome.  Appears to be dry gangrene. No sign of infection.  . Anemia  - Secondary to end-stage renal disease.  - Monitor hemoglobin and hematocrit, erythropoietin per nephrology.   . ESRD on dialysis  Renal following. HD per schedule.. Continue hectoral and Fosrenol with meals.  . DM type 2, uncontrolled, with renal complications  - Continue Lantus. 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 Retina specialist to see. Needs tighter blood glucose control. A1C of 8.1 and has elevated fsg. Will increase lantus to 15 units. continue SSI.  . Multifocal atrial tachycardia  Currently stable on telemetry.  . Chronic systolic heart failure  EF of 30-35% on  recent echo. Appears compensated. Continue beta blocker, indoor and hydralazine.  . Essential hypertension  - Stable, continue amlodipine, Coreg. Added hydralazine. Dose of Imdur increased. BP stable.  [name] COPD (chronic obstructive pulmonary disease)  nebulized bronchodilators as needed  . PVD (peripheral vascular disease)-status post recent right AKA  - Staples in place, no discharge erythema at the stump site.  -will Notify vascular for staples removal.  . Sacral decubitus stage II-III Minimal purulent discharge. ESR mildly elevated. On empiric vancomycin. Appreciate wound care evaluation. On santyl dressing   . History of prior VTE  - Recently taken off anticoagulation.  - Review of past medical history shows a history of peroneal vein DVT, however a recent lower extremity Doppler was negative.  -Since VQ scan is negative will not repeat Doppler lower extremity   Patient has significant medical issues with failure to thrive and poor outcome prognosis. Palliative care has been consulted . Patient does not have capacity. Upon discussion with the family by the cardiologist patient does not wish to undergo any other history of resuscitation if the need arises and she has been made DO NOT RESUSCITATE.     Code Status: DO NOT RESUSCITATE Family Communication: discussed with daughter sheila on 4/13 Disposition Plan: Pending inpatient workup. palliaitve care meeting to be scheduled likely on 4/15   Consultants:  Cardiology  Ophthalmology  Renal  Wound care  palliative care  Procedures:  None  Antibiotics:  IV vancomycin and Rocephin  HPI/Subjective: Patient seen and examined. Daughter at bedside. Still reports loss of vision in both eyes.  Objective: Filed Vitals:   01/25/14 0524  BP: 121/50  Pulse: 74  Temp: 98.4 F (36.9 C)  Resp: 18    Intake/Output Summary (Last 24 hours) at 01/25/14 0938 Last data filed at 01/24/14 1027  Gross per 24 hour  Intake     240 ml  Output      0 ml  Net    240 ml   Filed Weights   01/23/14 2018 01/24/14 0500 01/25/14 0524  Weight: 57.38 kg (126 lb 8 oz) 59 kg (130 lb 1.1 oz) 58.3 kg (128 lb 8.5 oz)    Exam:   General:  Elderly female in NAD,   HEENT: no pallor, pupils reactive b/l, moist mucosa, no vision bilaterally  Chest: clear b/l  CVS: NS1&S2, no murmurs,   Abd: soft, NT ND BS+  Ext: warm, no edema, rt AKA with clean staples  CNS: AAOX3,   Data Reviewed: Basic Metabolic Panel:  Recent Labs Lab 01/23/14 1139 01/23/14 2300 01/24/14 0340  NA 137  --  135*  K 4.1  --  4.4  CL 92*  --  92*  CO2 26  --  27  GLUCOSE 208*  --  335*  BUN 21  --  29*  CREATININE 3.73* 4.03* 4.30*  CALCIUM 10.0  --  9.7   Liver Function Tests:  Recent Labs Lab 01/23/14 1139  AST 32  ALT 15  ALKPHOS 134*  BILITOT 0.4  PROT 7.1  ALBUMIN 2.2*   No results found for this basename: LIPASE, AMYLASE,  in the last 168 hours No results found for this basename: AMMONIA,  in the last 168 hours CBC:  Recent Labs Lab 01/23/14 1139 01/23/14 2300 01/24/14 0340 01/25/14 0851  WBC 10.7* 10.6* 9.4 8.8  NEUTROABS 8.8*  --   --   --   HGB 10.9* 10.2* 9.5* 9.4*  HCT 36.0 32.8* 30.6* 30.5*  MCV 87.4 85.4 85.5 84.3  PLT 430* 403* 377 370   Cardiac Enzymes:  Recent Labs Lab 01/23/14 1627 01/23/14 2300 01/24/14 0340 01/24/14 1000  TROPONINI <0.30 <0.30 <0.30 <0.30   BNP (last 3 results)  Recent Labs  08/17/13 1603 12/22/13 1809 01/23/14 1140  PROBNP 28521.0* >70000.0* >70000.0*   CBG:  Recent Labs Lab 01/24/14 0728 01/24/14 1234 01/24/14 1655 01/24/14 2031 01/25/14 0757  GLUCAP 245* 191* 169* 88 82    Recent Results (from the past 240 hour(s))  URINE CULTURE     Status: None   Collection Time    01/23/14  1:34 PM      Result Value Ref Range Status   Specimen Description URINE, CLEAN CATCH   Final   Special Requests ADD [id number]   Final   Culture  Setup Time     Final    Value: 01/23/2014 17:34     Performed at [facility]     Final   Value: >=100,000 COLONIES/ML     Performed at [facility]   Culture     Final   Value: [place]     Performed at [facility]   Report Status PENDING   Incomplete  MRSA PCR SCREENING     Status: None   Collection Time    01/23/14  9:16 PM      Result Value Ref Range Status   MRSA by PCR NEGATIVE  NEGATIVE Final   Comment:            The GeneXpert MRSA Assay (FDA     approved for NASAL specimens  only), is one component of a     comprehensive MRSA colonization     surveillance program. It is not     intended to diagnose MRSA     infection nor to guide or     monitor treatment for     MRSA infections.     Studies: Ct Head Wo Contrast  01/23/2014   CLINICAL DATA:  Intermittent blindness, altered mental status  EXAM: CT HEAD WITHOUT CONTRAST  TECHNIQUE: Contiguous axial images were obtained from the base of the skull through the vertex without intravenous contrast.  COMPARISON:  MR HEAD W/O CM dated 01/06/2014; CT HEAD W/O CM dated 01/05/2014  FINDINGS: Calvarium is intact. There is moderate diffuse atrophy and low attenuation in the deep white matter, stable findings consistent with chronic involutional change. There is no evidence of infarct, hemorrhage, or extra-axial fluid.  IMPRESSION: No acute findings.  Chronic involutional change stable.   Electronically Signed   By: Skipper Cliche M.D.   On: 01/23/2014 19:35   Nm Pulmonary Perf And Vent  01/23/2014   CLINICAL DATA:  Chest pain, shortness of breath, evaluate for pulmonary embolism  EXAM: NUCLEAR MEDICINE VENTILATION - PERFUSION LUNG SCAN  TECHNIQUE: Ventilation images were obtained in multiple projections using inhaled aerosol technetium 99 M DTPA. Perfusion images were obtained in multiple projections after intravenous injection of Tc-42mMAA.  RADIOPHARMACEUTICALS:  40 mCi Tc-956mTPA aerosol and 6 mCi Tc-9944mA   COMPARISON:  DG CHEST 1V PORT dated 01/23/2014; DG CHEST 2 VIEW dated 12/22/2013; DG CHEST 2V dated 08/27/2013  FINDINGS: Review of chest radiograph performed earlier same day demonstrates grossly unchanged enlarged cardiac silhouette and mediastinal contours. Post median sternotomy and CABG. Interval placement of right internal jugular approach dialysis catheter. The lungs are hyperexpanded with mild diffuse thickening of the pulmonary interstitium. No pleural effusion or pneumothorax. Mild pulmonary venous congestion without frank evidence of edema.  Ventilation: There is mild diffuse mottled ventilation of the bilateral pulmonary parenchyma. There is clumping of inhaled radiotracer about the bilateral pulmonary hila, right greater than left. There is ingested radiotracer seen within the oropharynx, hypopharynx and stomach.  Perfusion: There is relative homogeneous distribution of injected radiotracer without discrete segmental or subsegmental mismatched filling defect to suggest pulmonary embolism. A minimal amount of extravasated radiotracer is seen about a right upper extremity IV site.  IMPRESSION: Pulmonary embolism absent (very low probability of pulmonary embolism).   Electronically Signed   By: JohSandi MariscalD.   On: 01/23/2014 23:15   Dg Chest Portable 1 View  01/23/2014   CLINICAL DATA:  Shortness of breath  EXAM: PORTABLE CHEST - 1 VIEW  COMPARISON:  DG CHEST 1V PORT dated 01/09/2014  FINDINGS: Borderline cardiomegaly noted with evidence of CABG. Right IJ approach dual lumen dialysis catheter tip terminates over the distal SVC at possible high right atrium. No focal pulmonary consolidation is identified. Diffusely prominent reticular markings are reidentified with improved aeration since previously.  IMPRESSION: No focal acute cardiopulmonary process.  Cardiomegaly with stable prominence of diffuse reticular markings, nonspecific.   Electronically Signed   By: GreConchita ParisD.   On: 01/23/2014 13:58     Scheduled Meds: . amLODipine  10 mg Oral Daily  . atorvastatin  40 mg Oral Daily  . calcitRIOL  0.5 mcg Oral Daily  . carvedilol  12.5 mg Oral BID WC  . cefTRIAXone (ROCEPHIN)  IV  1 g Intravenous Q24H  . collagenase   Topical Daily  . darbepoetin (  ARANESP) injection - DIALYSIS  100 mcg Intravenous Q Wed-HD  . doxercalciferol  2 mcg Intravenous Once  . feeding supplement (NEPRO CARB STEADY)  237 mL Oral TID WC  . ferrous sulfate  325 mg Oral BID  . folic acid  1 mg Oral Daily  . heparin  1,800 Units Dialysis Once in dialysis  . heparin subcutaneous  5,000 Units Subcutaneous 3 times per day  . hydrALAZINE  25 mg Oral BID  . insulin aspart  0-9 Units Subcutaneous TID WC  . insulin glargine  10 Units Subcutaneous QHS  . isosorbide mononitrate  30 mg Oral Daily  . lanthanum  500 mg Oral TID WC  . mirtazapine  7.5 mg Oral QHS  . multivitamin  1 tablet Oral QHS  . omega-3 acid ethyl esters  1 g Oral QID  . pantoprazole  40 mg Oral Daily  . sodium chloride  3 mL Intravenous Q12H  . vancomycin  500 mg Intravenous Q T,Th,Sa-HD  . vitamin C  500 mg Oral Daily  . zinc sulfate  220 mg Oral Daily   Continuous Infusions:     Time spent: 25 MINUTES    Rosealynn Mateus  [facility] Pager [phone] If 7PM-7AM, please contact night-coverage at [email or web address], password [facility] 01/25/2014, 9:38 AM  LOS: 2 days

## 2014-01-25 NOTE — Progress Notes (Signed)
Patient Name: Rhonda CarrowMary P XXXBynum Date of Encounter: 01/25/2014   Principal Problem:   Chest pain Active Problems:   S/P CABG x 5   Visual loss, bilateral   PVD (peripheral vascular disease)   Chronic systolic heart failure   COPD (chronic obstructive pulmonary disease)   DM type 2, uncontrolled, with renal complications   ESRD on dialysis   Essential hypertension   Anemia   Multifocal atrial tachycardia   UTI (lower urinary tract infection)   Malnutrition of moderate degree   SUBJECTIVE  Has been having mild chest pain over her lower sternum, over the past hour, worsened by eating breakfast.  NAD.  Pain is not worse with deep breathing or palpation.  No associated Ss.  CURRENT MEDS . amLODipine  10 mg Oral Daily  . atorvastatin  40 mg Oral Daily  . calcitRIOL  0.5 mcg Oral Daily  . carvedilol  12.5 mg Oral BID WC  . cefTRIAXone (ROCEPHIN)  IV  1 g Intravenous Q24H  . collagenase   Topical Daily  . darbepoetin (ARANESP) injection - DIALYSIS  100 mcg Intravenous Q Wed-HD  . doxercalciferol  2 mcg Intravenous Once  . feeding supplement (NEPRO CARB STEADY)  237 mL Oral TID WC  . ferrous sulfate  325 mg Oral BID  . folic acid  1 mg Oral Daily  . heparin  1,800 Units Dialysis Once in dialysis  . heparin subcutaneous  5,000 Units Subcutaneous 3 times per day  . hydrALAZINE  25 mg Oral BID  . insulin aspart  0-9 Units Subcutaneous TID WC  . insulin glargine  10 Units Subcutaneous QHS  . isosorbide mononitrate  30 mg Oral Daily  . lanthanum  500 mg Oral TID WC  . mirtazapine  7.5 mg Oral QHS  . multivitamin  1 tablet Oral QHS  . omega-3 acid ethyl esters  1 g Oral QID  . pantoprazole  40 mg Oral Daily  . sodium chloride  3 mL Intravenous Q12H  . vancomycin  500 mg Intravenous Q T,Th,Sa-HD  . vitamin C  500 mg Oral Daily  . zinc sulfate  220 mg Oral Daily    OBJECTIVE  Filed Vitals:   01/24/14 0504 01/24/14 1324 01/24/14 2101 01/25/14 0524  BP: 147/65 118/84 138/75  121/50  Pulse: 80 80 74 74  Temp: 98 F (36.7 C) 97.2 F (36.2 C) 97.8 F (36.6 C) 98.4 F (36.9 C)  TempSrc: Oral Oral Oral Oral  Resp: 18 20 18 18   Height:      Weight:    128 lb 8.5 oz (58.3 kg)  SpO2: 96% 97% 100% 97%    Intake/Output Summary (Last 24 hours) at 01/25/14 0910 Last data filed at 01/24/14 1027  Gross per 24 hour  Intake    240 ml  Output      0 ml  Net    240 ml   Filed Weights   01/23/14 2018 01/24/14 0500 01/25/14 0524  Weight: 126 lb 8 oz (57.38 kg) 130 lb 1.1 oz (59 kg) 128 lb 8.5 oz (58.3 kg)   PHYSICAL EXAM  General: Pleasant, NAD. Neuro: Alert and oriented X 3. Moves all extremities spontaneously. Psych: Normal affect. HEENT:  Normal  Neck: Supple without bruits or JVD. Lungs:  Resp regular and unlabored, CTA. Heart: RRR no s3, s4, or murmurs. Abdomen: Soft, non-tender, non-distended, BS + x 4.  Extremities: No clubbing, cyanosis.  Trace LLE edema.  R BKA.  Accessory Clinical Findings  CBC  Recent Labs  01/23/14 1139 01/23/14 2300 01/24/14 0340  WBC 10.7* 10.6* 9.4  NEUTROABS 8.8*  --   --   HGB 10.9* 10.2* 9.5*  HCT 36.0 32.8* 30.6*  MCV 87.4 85.4 85.5  PLT 430* 403* 377   Basic Metabolic Panel  Recent Labs  01/23/14 1139 01/23/14 2300 01/24/14 0340  NA 137  --  135*  K 4.1  --  4.4  CL 92*  --  92*  CO2 26  --  27  GLUCOSE 208*  --  335*  BUN 21  --  29*  CREATININE 3.73* 4.03* 4.30*  CALCIUM 10.0  --  9.7   Liver Function Tests  Recent Labs  01/23/14 1139  AST 32  ALT 15  ALKPHOS 134*  BILITOT 0.4  PROT 7.1  ALBUMIN 2.2*   Cardiac Enzymes  Recent Labs  01/23/14 2300 01/24/14 0340 01/24/14 1000  TROPONINI <0.30 <0.30 <0.30   D-Dimer  Recent Labs  01/23/14 1657  DDIMER 1.84*   TELE  rsr  Radiology/Studies  Ct Head Wo Contrast  01/23/2014   CLINICAL DATA:  Intermittent blindness, altered mental status  EXAM: CT HEAD WITHOUT CONTRAST   IMPRESSION: No acute findings.  Chronic involutional  change stable.   Electronically Signed   By: Esperanza Heir M.D.   On: 01/23/2014 19:35   Nm Pulmonary Perf And Vent  01/23/2014   CLINICAL DATA:  Chest pain, shortness of breath, evaluate for pulmonary embolism  EXAM: NUCLEAR MEDICINE VENTILATION - PERFUSION LUNG SCAN   IMPRESSION: Pulmonary embolism absent (very low probability of pulmonary embolism).   Electronically Signed   By: Simonne Come M.D.   On: 01/23/2014 23:15   Dg Chest Portable 1 View  01/23/2014   CLINICAL DATA:  Shortness of breath  EXAM: PORTABLE CHEST - 1 VIEW IMPRESSION: No focal acute cardiopulmonary process.  Cardiomegaly with stable prominence of diffuse reticular markings, nonspecific.   Electronically Signed   By: Christiana Pellant M.D.   On: 01/23/2014 13:58   ASSESSMENT AND PLAN  1.  Midsternal chest pain/CAD:  Pt with more chest pain this AM.  ECG pending.  Troponins have been nl up to this point.  She is on prn mylanta and daily PPI.  Dr. Blanchie Dessert note reviewed.  No plan for repeat ischemic eval in setting of absence of obj evidence of ischemia at this time.  She prefers conservative approach.  Cont statin, bb, nitrate.  She is allergic to ASA - consider plavix.  2.  Chronic systolic CHF:  EF 30-35%.  Volume stable and managed by renal/dialysis.  Cont bb, hydralazine/nitrate.  She is not felt to be a strong candidate for ICD 2/2 co-morbidities.  3.  ESRD: HD per renal.  4.  DM:  Per IM.  Signed, Ok Anis NP   I have seen and examined the patient along with Ok Anis NP.  I have reviewed the chart, notes and new data.  I agree with NP's note.  She is refusing dialysis. She is "exhausted" and "tired of being doctored on". It sounds like she would prefer palliative care. Apparently, the family is in agreement.  PLAN: I think a family conference and palliative care evaluation is appropriate. At least from a cardiac standpoint, therapeutic options are very limited.  Thurmon Fair, MD,  [facility] [phone] 01/25/2014, 1:57 PM

## 2014-01-25 NOTE — Progress Notes (Signed)
Pt refused to come to HD stating to the nurse she wanted to stop HD.  I spoke with pt and her family and her wishes are to stop HD.  She wants to go home with hospice.  Discussed this with Dr Gonzella Lex as well.

## 2014-01-26 ENCOUNTER — Encounter (HOSPITAL_COMMUNITY): Payer: Self-pay | Admitting: General Surgery

## 2014-01-26 DIAGNOSIS — N184 Chronic kidney disease, stage 4 (severe): Secondary | ICD-10-CM

## 2014-01-26 DIAGNOSIS — L89109 Pressure ulcer of unspecified part of back, unspecified stage: Secondary | ICD-10-CM

## 2014-01-26 DIAGNOSIS — L89309 Pressure ulcer of unspecified buttock, unspecified stage: Secondary | ICD-10-CM

## 2014-01-26 DIAGNOSIS — Z515 Encounter for palliative care: Secondary | ICD-10-CM

## 2014-01-26 LAB — BASIC METABOLIC PANEL
BUN: 48 mg/dL — ABNORMAL HIGH (ref 6–23)
CALCIUM: 9.9 mg/dL (ref 8.4–10.5)
CO2: 28 mEq/L (ref 19–32)
CREATININE: 5.3 mg/dL — AB (ref 0.50–1.10)
Chloride: 95 mEq/L — ABNORMAL LOW (ref 96–112)
GFR calc non Af Amer: 7 mL/min — ABNORMAL LOW (ref >90)
GFR, EST AFRICAN AMERICAN: 8 mL/min — AB (ref >90)
Glucose, Bld: 77 mg/dL (ref 70–99)
Potassium: 3.8 mEq/L (ref 3.7–5.3)
Sodium: 137 mEq/L (ref 137–147)

## 2014-01-26 LAB — GLUCOSE, CAPILLARY
GLUCOSE-CAPILLARY: 71 mg/dL (ref 70–99)
Glucose-Capillary: 61 mg/dL — ABNORMAL LOW (ref 70–99)
Glucose-Capillary: 78 mg/dL (ref 70–99)
Glucose-Capillary: 84 mg/dL (ref 70–99)
Glucose-Capillary: 116 mg/dL — ABNORMAL HIGH (ref 70–99)

## 2014-01-26 MED ORDER — TROPICAMIDE 1 % OP SOLN
1.0000 [drp] | Freq: Once | OPHTHALMIC | Status: AC
  Administered 2014-01-26: 1 [drp] via OPHTHALMIC
  Filled 2014-01-26: qty 2

## 2014-01-26 MED ORDER — LIDOCAINE-EPINEPHRINE 1 %-1:100000 IJ SOLN
20.0000 mL | Freq: Once | INTRAMUSCULAR | Status: DC
  Filled 2014-01-26: qty 20

## 2014-01-26 MED ORDER — INSULIN GLARGINE 100 UNIT/ML ~~LOC~~ SOLN
5.0000 [IU] | Freq: Every day | SUBCUTANEOUS | Status: DC
  Administered 2014-01-26: 5 [IU] via SUBCUTANEOUS
  Filled 2014-01-26 (×2): qty 0.05

## 2014-01-26 MED ORDER — PHENYLEPHRINE HCL 2.5 % OP SOLN
1.0000 [drp] | Freq: Once | OPHTHALMIC | Status: AC
  Administered 2014-01-26: 1 [drp] via OPHTHALMIC
  Filled 2014-01-26: qty 2

## 2014-01-26 MED ORDER — VANCOMYCIN HCL 500 MG IV SOLR
500.0000 mg | INTRAVENOUS | Status: DC
  Filled 2014-01-26: qty 500

## 2014-01-26 MED ORDER — INSULIN GLARGINE 100 UNIT/ML ~~LOC~~ SOLN: 12.0000 [IU] | Freq: Every day | SUBCUTANEOUS | Status: DC

## 2014-01-26 MED ORDER — SENNOSIDES-DOCUSATE SODIUM 8.6-50 MG PO TABS
2.0000 | ORAL_TABLET | Freq: Every evening | ORAL | Status: DC | PRN
  Filled 2014-01-26: qty 2

## 2014-01-26 MED ORDER — VANCOMYCIN HCL 500 MG IV SOLR: 500.0000 mg | INTRAVENOUS | Status: DC

## 2014-01-26 MED ORDER — OXYCODONE HCL 5 MG PO TABS
5.0000 mg | ORAL_TABLET | ORAL | Status: AC
  Administered 2014-01-26: 5 mg via ORAL
  Filled 2014-01-26: qty 1

## 2014-01-26 MED ORDER — OXYCODONE HCL 5 MG PO TABS
10.0000 mg | ORAL_TABLET | ORAL | Status: DC | PRN
  Administered 2014-01-26 – 2014-02-02 (×15): 10 mg via ORAL
  Filled 2014-01-26 (×13): qty 2

## 2014-01-26 NOTE — Progress Notes (Signed)
[facility] PROGRESS NOTE  [name] TIW:[id number] DOB: Oct 24, 1940 DOA: 01/23/2014 PCP: Rhonda Bolt, MD  Assessment/Plan: Chest pain  Denies further pain.[name]  Hx of significant CAD. troponins are negative. cardiology evaluated the pt and recommended not a candidate  cardiac cath and she is not a candidate for CABG. she has high bleeding risk on dual antiplatelet therapy following PCI.  Also has severe cardiomyopathy but is not a candidate for AICD Given co morbidities and poor long term prognosis..   Recommend optimal medical management. Increased dose of imdur. added hydralazine.  continue coreg. Patient has been allergy to aspirin. Started on plavix. -VQ scan with low probability for PE.  . Bilateral visual loss  Painless visual loss, has been going on for at least for the past 5 days. Seen by opthalmology Dr Posey Pronto and suggests findings of background proliferative retinopathy. opthalmology will consult retina specialist to evaluate. Her.CT head unremarkable. ESR and CRP mildly elevated. -needs tighter blood glucose control.  -Dr. Deloria Lair, a retina specialist. Has agreed to see [name] per Dr Posey Pronto note.   [name] UTI (lower urinary tract infection)  - urine cultures.( >100k colonies), Enterobacter. - Sensitive to ciprofloxacin.  -Continue with ciprofloxacin.   . Left little finger gangrene  - History of left hand steal syndrome.  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 No sign of infection.  . Anemia  - Secondary to end-stage renal disease.  - Monitor hemoglobin and hematocrit, erythropoietin per nephrology.   . ESRD on dialysis  Renal following. HD per schedule.. Continue hectoral and Fosrenol with meals. Patient declining dialysis. Palliative care meeting today.   . DM type 2, uncontrolled, with renal complications  - Continue Lantus. 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 Retina specialist to see. Needs tighter blood glucose control. A1C of 8.1 and has elevated  fsg. continue SSI. -Decrease lantus to 5 units due to CBG at 60 in am. Patient didn't received lantus last night because family refused.   . Multifocal atrial tachycardia  Currently stable on telemetry.  . Chronic systolic heart failure  EF of 30-35% on recent echo. Appears compensated. Continue beta blocker, indoor and hydralazine.  . Essential hypertension  - Stable, continue amlodipine, Coreg. Added hydralazine. Dose of Imdur increased. BP stable.  [name] COPD (chronic obstructive pulmonary disease)  nebulized bronchodilators as needed  . PVD (peripheral vascular disease)-status post recent right AKA  - Staples in place, no discharge erythema at the stump site.  -will Notify vascular for staples removal.  . Sacral decubitus stage II-III Minimal purulent discharge. ESR mildly elevated. Appreciate wound care evaluation. On santyl dressing Was on vancomycin. Will follow palliative care meeting to determine need of surgical consultation.   [name] History of prior VTE  - Recently taken off anticoagulation.  - Review of past medical history shows a history of peroneal vein DVT, however a recent lower extremity Doppler was negative.  -Since VQ scan is negative will not repeat Doppler lower extremity    Code Status: DO NOT RESUSCITATE Family Communication: discussed with patient.  Disposition Plan: Pending palliaitve care meeting to be scheduled likely on 4/15   Consultants:  Cardiology  Ophthalmology  Renal  Wound care  palliative care  Procedures:  None  Antibiotics:  IV vancomycin and Rocephin  HPI/Subjective: Patient denies chest pain. Her pain is sacral area.   Objective: Filed Vitals:   01/26/14 0619  BP: 142/69  Pulse: 77  Temp: 98.7 F (37.1 C)  Resp: 18    Intake/Output Summary (  Last 24 hours) at 01/26/14 1305 Last data filed at 01/26/14 1200  Gross per 24 hour  Intake     60 ml  Output    250 ml  Net   -190 ml   Filed Weights   01/23/14 2018  01/24/14 0500 01/25/14 0524  Weight: 57.38 kg (126 lb 8 oz) 59 kg (130 lb 1.1 oz) 58.3 kg (128 lb 8.5 oz)    Exam:   General:  Elderly female in NAD,   HEENT: no pallor, pupils reactive b/l, moist mucosa.   Chest: clear b/l  CVS: NS1&S2, no murmurs,   Abd: soft, NT ND BS+  Ext: warm, no edema, rt AKA with clean staples  CNS: AAOX3,   Data Reviewed: Basic Metabolic Panel:  Recent Labs Lab 01/23/14 1139 01/23/14 2300 01/24/14 0340 01/25/14 0851 01/26/14 0516  NA 137  --  135* 137 137  K 4.1  --  4.4 4.0 3.8  CL 92*  --  92* 93* 95*  CO2 26  --  27 28 28   GLUCOSE 208*  --  335* 89 77  BUN 21  --  29* 41* 48*  CREATININE 3.73* 4.03* 4.30* 5.02* 5.30*  CALCIUM 10.0  --  9.7 9.8 9.9  PHOS  --   --   --  2.2*  --    Liver Function Tests:  Recent Labs Lab 01/23/14 1139 01/25/14 0851  AST 32  --   ALT 15  --   ALKPHOS 134*  --   BILITOT 0.4  --   PROT 7.1  --   ALBUMIN 2.2* 1.9*   No results found for this basename: LIPASE, AMYLASE,  in the last 168 hours No results found for this basename: AMMONIA,  in the last 168 hours CBC:  Recent Labs Lab 01/23/14 1139 01/23/14 2300 01/24/14 0340 01/25/14 0851  WBC 10.7* 10.6* 9.4 8.8  NEUTROABS 8.8*  --   --   --   HGB 10.9* 10.2* 9.5* 9.4*  HCT 36.0 32.8* 30.6* 30.5*  MCV 87.4 85.4 85.5 84.3  PLT 430* 403* 377 370   Cardiac Enzymes:  Recent Labs Lab 01/23/14 1627 01/23/14 2300 01/24/14 0340 01/24/14 1000  TROPONINI <0.30 <0.30 <0.30 <0.30   BNP (last 3 results)  Recent Labs  08/17/13 1603 12/22/13 1809 01/23/14 1140  PROBNP 28521.0* >70000.0* >70000.0*   CBG:  Recent Labs Lab 01/25/14 1631 01/25/14 2114 01/26/14 0235 01/26/14 0726 01/26/14 1119  GLUCAP 101* 95 71 61* 78    Recent Results (from the past 240 hour(s))  URINE CULTURE     Status: None   Collection Time    01/23/14  1:34 PM      Result Value Ref Range Status   Specimen Description URINE, CLEAN CATCH   Final   Special  Requests ADD [id number]   Final   Culture  Setup Time     Final   Value: 01/23/2014 17:34     Performed at [place]     Final   Value: >=100,000 COLONIES/ML     Performed at [facility]   Culture     Final   Value: ENTEROBACTER AEROGENES     Performed at [facility]   Report Status 01/25/2014 FINAL   Final   Organism ID, Bacteria ENTEROBACTER AEROGENES   Final  MRSA PCR SCREENING     Status: None   Collection Time    01/23/14  9:16 PM  Result Value Ref Range Status   MRSA by PCR NEGATIVE  NEGATIVE Final   Comment:            The GeneXpert MRSA Assay (FDA     approved for NASAL specimens     only), is one component of a     comprehensive MRSA colonization     surveillance program. It is not     intended to diagnose MRSA     infection nor to guide or     monitor treatment for     MRSA infections.     Studies: No results found.  Scheduled Meds: . amLODipine  10 mg Oral Daily  . atorvastatin  40 mg Oral Daily  . calcitRIOL  0.5 mcg Oral Daily  . carvedilol  12.5 mg Oral BID WC  . ciprofloxacin  500 mg Oral Q24H  . clopidogrel  75 mg Oral Daily  . collagenase   Topical Daily  . darbepoetin (ARANESP) injection - DIALYSIS  100 mcg Intravenous Q Wed-HD  . doxercalciferol  2 mcg Intravenous Once  . feeding supplement (NEPRO CARB STEADY)  237 mL Oral TID WC  . ferrous sulfate  325 mg Oral BID  . folic acid  1 mg Oral Daily  . heparin  1,800 Units Dialysis Once in dialysis  . heparin subcutaneous  5,000 Units Subcutaneous 3 times per day  . hydrALAZINE  25 mg Oral BID  . insulin aspart  0-9 Units Subcutaneous TID WC  . insulin glargine  15 Units Subcutaneous QHS  . isosorbide mononitrate  30 mg Oral Daily  . lanthanum  500 mg Oral TID WC  . mirtazapine  7.5 mg Oral QHS  . multivitamin  1 tablet Oral QHS  . omega-3 acid ethyl esters  1 g Oral QID  . pantoprazole  40 mg Oral Daily  . sodium chloride  3 mL Intravenous Q12H   . vitamin C  500 mg Oral Daily  . zinc sulfate  220 mg Oral Daily   Continuous Infusions:     Time spent: [facility] Pager [phone] If 7PM-7AM, please contact night-coverage at [email or web address], password [facility] 01/26/2014, 1:05 PM  LOS: 3 days

## 2014-01-26 NOTE — Progress Notes (Signed)
Inpatient Diabetes Program Recommendations  AACE/ADA: New Consensus Statement on Inpatient Glycemic Control (2013)  Target Ranges:  Prepandial:   less than 140 mg/dL      Peak postprandial:   less than 180 mg/dL (1-2 hours)      Critically ill patients:  140 - 180 mg/dL     Results for AYDA, DIEBERT (MRN [id number]) as of 01/26/2014 11:35  Ref. Range 01/25/2014 07:57 01/25/2014 11:53 01/25/2014 16:31 01/25/2014 21:14  Glucose-Capillary Latest Range: 70-99 mg/dL 82 449 (H) 201 (H) 95    Results for JAKISHA, HAMS (MRN [id number]) as of 01/26/2014 11:35  Ref. Range 01/26/2014 02:35 01/26/2014 07:26 01/26/2014 11:19  Glucose-Capillary Latest Range: 70-99 mg/dL 71 61 (L) 78    Patient with hypoglycemia this morning.    Lantus NOT given last night due to patient/family refusal.    MD- Please consider decreasing Lantus back to 10 units QHS (home dose)- Patient did not get any Lantus last night and had hypoglycemia this morning   Will follow Ambrose Finland RN, MSN, CDE Diabetes Coordinator Inpatient Diabetes Program Team Pager: [phone] (8a-10p)

## 2014-01-26 NOTE — Progress Notes (Addendum)
Spoke with patient's daughter this morning about plan of care for patient.  Daughter requested that RN ask her mother to consider dialysis.  RN agreed and listened to daughter's concerns about her mother's end of life care.  MD and social work notified of plan and will await goals of care meeting later today. Anderson Malta [facility]  Returned patient's daughter's call to address her concerns and answer questions.  She wanted to know who the "top dog" at [facility] was and how to get in touch with him.  I told her I was happy to answer her questions or find the appropriate people who could, but told her that nothing would be decided for her mother's care until the goals of care meeting at 1:30 this afternoon.  Silvio Pate (patient's daughter) hung up phone.  Anderson Malta [facility]

## 2014-01-26 NOTE — Consult Note (Signed)
Patient [name]      DOB: [birth year]      EXB:[id number]     Consult Note from the Palliative Medicine Team at [place] Requested by: Dr. Sloan Leiter     PCP: Dwan Bolt, MD Reason for Consultation: [place] and options    Phone Number:[phone]  Assessment of patients Current state: Ms. Gowans is a 73 yo female with CAD, UTI, h/o left hand steal syndrome with left little finger dry gangrene?, ESRD requiring hemodialysis, DM type II, systolic CHF (EF 10-27%), COPD, PVD, HTN, and now sacral decubitus stage II-III. [name] is refusing dialysis and saying she is ready to die so palliative consulted to assist with goals. This patient is known to me from past admission and has complicated family dynamics. Astryd has intermittent confusion and has appointed husband, Jenny Reichmann (separated for ~20 years and he is unable to be present), and then daughter, [name], to make decisions for her if she is unable.  I met today with Stanton Kidney alone where she tells me that she does not want to continue dialysis and that she understands she will have days to weeks to live if she terminates dialysis. She tells me that she is content and understands her decisions. I then met with Jacalyn Lefevre, and Sheila's husband Bruce. [name] dominates the conversation by explaining that she knows her mother best and that she is upset about 2 different physicians that "have convinced her mother to give up." She tells me that she does not believe that her mother is ready to give up and that she won't go to dialysis only because she is in pain and she doesn't want to live in pain but tells her mother that another physician met with them yesterday and "promised to take care of her pain." [name] is convinced that if Kyleena's pain is better controlled than Jerri will be more content going to dialysis and "to fight." I asked for [name] to let her mother speak and tell [name] what she wanted and what her plan is. [name] spoke up and said that  she wants to go to dialysis and that she wanted her pain controlled. I then asked Phillipa to tell [name] the plan and she tells me "not to go to dialysis." Ellese says 3 different times that she would and then a minute later that she would not go to dialysis. I discussed that although [name] is saying they weren't raised to give up and that she didn't believe her mother should stop dialysis, that we all have a say in how we live out the rest of our lives no matter how much time we have left.   I encouraged Tanny to speak up and explain to [name] if she does not want dialysis and I encouraged [name] to listen to her mother and that we cannot force her to do things she is refusing. We discussed that the body becomes too weak sometimes and that choosing to limit medical interventions (including dialysis) may be appropriate because it becomes too much to go through. We discussed the role of hospice in this case. I encouraged them to take it a day at a time and consider the options and quality of life. Last admission when I met with Stanton Kidney she was indecisive about goals of care as well and had many changes from day to day- tried to explain to [name] how this will impact her overall care if she begins to refuse dialysis/medications again and how important it  is to consider these options.     Goals of Care: 1.  Code Status: DNR   2. Scope of Treatment: Continue medical treatment and dialysis.    4. Disposition: To be determined on outcomes.    3. Symptom Management:   1. Pain: Increased oxycodone to 10 mg po every 4 hours prn.  2. Bowel Regimen: Senna S prn.  3. Fever: Acetaminophen prn.  4. Nausea/Vomiting: Ondansetron prn.  5. Itching: Benadryl prn.  6. Coughing: Robitussin prn.   4. Psychosocial: Emotional support given to patient and family.    Brief HPI:  73 yo female with CAD, UTI, h/o left hand steal syndrome with left little finger dry gangrene?, ESRD requiring hemodialysis, DM type II, systolic CHF (EF  76-19%), COPD, PVD, HTN, and now sacral decubitus stage II-III.     ROS: + pain, denies nausea/constipation/chest pain    PMH:  Past Medical History  Diagnosis Date  . CAD (coronary artery disease)   . Hypertension   . Diabetes mellitus     type 2 insulin dependent  . Hyperlipidemia   . CHF (congestive heart failure)   . Cellulitis     left foot  . Ulcer     diabetic ulcer on left foot  . DVT (deep venous thrombosis)     peroneal vein  . Peripheral vascular disease   . Chronic kidney disease   . Anemia   . Shortness of breath     with fluid  . Insomnia      PSH: Past Surgical History  Procedure Laterality Date  . Tubal ligation    . Pr vein bypass graft,aorto-fem-pop  06-26-11    1. PATENT LEFT FEMORAL-POPLITEAL BYPASS GRAFT W/NO EVEIDENCE OF STENOSIS. 2.VELOCITIES OF GREATER THAN 200 CM/'s NOTED ON PREVIOUS EXAM 10/03/11 WERE NOT ADEQUATELY VISULAIZED DURING THIS EXAM  . Coronary artery bypass graft  04/25/08    Dr. Servando Snare, CABG x 5   . Nm myoview ltd  01/13/13    LEXISCAN; LV WALL MOTION: LVEF 44%, INFERIOR AKINESIS, ANTEROAPICAL HYPOKINESIS  . Cardiac catheterization  04/22/08    SEVERE 3-VESSEL DISEASE CAD., CABG X 5 04/25/08 WITH DR. JKDTOIZT  . Av fistula placement Left 09/07/2013    Procedure: ARTERIOVENOUS (AV) FISTULA CREATION;  Surgeon: Elam Dutch, MD;  Location: [facility];  Service: Vascular;  Laterality: Left;  . Ligation arteriovenous gortex graft Left 01/09/2014    Procedure: LIGATION ARTERIOVENOUS GORTEX GRAFT;  Surgeon: Elam Dutch, MD;  Location: [place];  Service: Vascular;  Laterality: Left;  . Insertion of dialysis catheter Right 01/09/2014    Procedure: INSERTION OF DIALYSIS CATHETER;  Surgeon: Elam Dutch, MD;  Location: [place];  Service: Vascular;  Laterality: Right;  Insertion of diatek to right internal Jugular artery.  . Amputation Right 01/12/2014    Procedure: AMPUTATION ABOVE KNEE- RIGHT;  Surgeon: Mal Misty, MD;  Location[facility];   Service: Vascular;  Laterality: Right;  . 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 Allergies  Allergen Reactions  . Ivp Dye [Iodinated Diagnostic Agents] Hives and Rash  . Aspirin Hives   Scheduled Meds: . amLODipine  10 mg Oral Daily  . atorvastatin  40 mg Oral Daily  . calcitRIOL  0.5 mcg Oral Daily  . carvedilol  12.5 mg Oral BID WC  . ciprofloxacin  500 mg Oral Q24H  . clopidogrel  75 mg  Oral Daily  . collagenase   Topical Daily  . darbepoetin (ARANESP) injection - DIALYSIS  100 mcg Intravenous Q Wed-HD  . doxercalciferol  2 mcg Intravenous Once  . feeding supplement (NEPRO CARB STEADY)  237 mL Oral TID WC  . ferrous sulfate  325 mg Oral BID  . folic acid  1 mg Oral Daily  . heparin  1,800 Units Dialysis Once in dialysis  . heparin subcutaneous  5,000 Units Subcutaneous 3 times per day  . hydrALAZINE  25 mg Oral BID  . insulin aspart  0-9 Units Subcutaneous TID WC  . insulin glargine  5 Units Subcutaneous QHS  . isosorbide mononitrate  30 mg Oral Daily  . lanthanum  500 mg Oral TID WC  . mirtazapine  7.5 mg Oral QHS  . multivitamin  1 tablet Oral QHS  . omega-3 acid ethyl esters  1 g Oral QID  . pantoprazole  40 mg Oral Daily  . sodium chloride  3 mL Intravenous Q12H  . vitamin C  500 mg Oral Daily  . zinc sulfate  220 mg Oral Daily   Continuous Infusions:  PRN Meds:.sodium chloride, sodium chloride, sodium chloride, acetaminophen, acetaminophen, albuterol, alum & mag hydroxide-simeth, diphenhydrAMINE, feeding supplement (NEPRO CARB STEADY), guaiFENesin-dextromethorphan, heparin, lidocaine (PF), lidocaine-prilocaine, nitroGLYCERIN, ondansetron (ZOFRAN) IV, ondansetron, oxyCODONE, pentafluoroprop-tetrafluoroeth, sodium chloride    BP 142/69  Pulse 77  Temp(Src) 98.7 F (37.1 C) (Oral)  Resp 18  Ht 5' 3"  (1.6 m)  Wt 58.3 kg (128 lb 8.5 oz)  BMI 22.77 kg/m2  SpO2 97%   PPS:  30%   Intake/Output Summary (Last 24 hours) at 01/26/14 1446 Last data filed at 01/26/14 1200  Gross per 24 hour  Intake     60 ml  Output    250 ml  Net   -190 ml   LBM: 01/25/14                         Physical Exam:  General: NAD, ill appearing, frail HEENT: + Temporal muscle wasting Chest: CTA throughout, no labored breathing, room air CVS: RRR, S1 S2 Abdomen: Soft, NT, ND, +BS Ext: MAE, no edema, warm to touch, recent Rt AKA with staples (without redness, edema) Neuro: Alert, awake, oriented to person/place/somewhat to situation  Labs: CBC    Component Value Date/Time   WBC 8.8 01/25/2014 0851   RBC 3.62* 01/25/2014 0851   HGB 9.4* 01/25/2014 0851   HCT 30.5* 01/25/2014 0851   PLT 370 01/25/2014 0851   MCV 84.3 01/25/2014 0851   MCH 26.0 01/25/2014 0851   MCHC 30.8 01/25/2014 0851   RDW 17.6* 01/25/2014 0851   LYMPHSABS 1.1 01/23/2014 1139   MONOABS 0.6 01/23/2014 1139   EOSABS 0.2 01/23/2014 1139   BASOSABS 0.0 01/23/2014 1139    BMET    Component Value Date/Time   NA 137 01/26/2014 0516   K 3.8 01/26/2014 0516   CL 95* 01/26/2014 0516   CO2 28 01/26/2014 0516   GLUCOSE 77 01/26/2014 0516   BUN 48* 01/26/2014 0516   CREATININE 5.30* 01/26/2014 0516   CALCIUM 9.9 01/26/2014 0516   CALCIUM 8.7 07/01/2011 1831   GFRNONAA 7* 01/26/2014 0516   GFRAA 8* 01/26/2014 0516    CMP     Component Value Date/Time   NA 137 01/26/2014 0516   K 3.8 01/26/2014 0516   CL 95* 01/26/2014 0516   CO2 28 01/26/2014 0516   GLUCOSE 77 01/26/2014 0516  BUN 48* 01/26/2014 0516   CREATININE 5.30* 01/26/2014 0516   CALCIUM 9.9 01/26/2014 0516   CALCIUM 8.7 07/01/2011 1831   PROT 7.1 01/23/2014 1139   ALBUMIN 1.9* 01/25/2014 0851   AST 32 01/23/2014 1139   ALT 15 01/23/2014 1139   ALKPHOS 134* 01/23/2014 1139   BILITOT 0.4 01/23/2014 1139   GFRNONAA 7* 01/26/2014 0516   GFRAA 8* 01/26/2014 0516      Time In Time Out Total Time Spent with Patient Total Overall Time  1400 1500 89mn 658m    Greater  than 50%  of this time was spent counseling and coordinating care related to the above assessment and plan.  AlVinie SillNP Palliative Medicine Team Pager # [phone]-F 8a-5p) Team Phone # [phone]/Weekends)

## 2014-01-26 NOTE — Progress Notes (Addendum)
Spoke with Kenney Houseman at Dr. Ephriam Knuckles office about follow-up care concerning patient's diabetic retinopathy.  MD in surgery but will call back in to discuss plan of care. [facility]

## 2014-01-26 NOTE — Progress Notes (Deleted)
Error

## 2014-01-26 NOTE — Progress Notes (Signed)
ANTIBIOTIC CONSULT NOTE - INITIAL  Pharmacy Consult for vancomycin Indication: wound infection  Allergies  Allergen Reactions  . Ivp Dye [Iodinated Diagnostic Agents] Hives and Rash  . Aspirin Hives    Patient Measurements: Height: 5\' 3"  (160 cm) Weight: 128 lb 8.5 oz (58.3 kg) IBW/kg (Calculated) : 52.4   Vital Signs: Temp: 98.5 F (36.9 C) (04/15 1455) Temp src: Oral (04/15 1455) BP: 121/47 mmHg (04/15 1455) Pulse Rate: 77 (04/15 1455) Intake/Output from previous day: 04/14 0701 - 04/15 0700 In: 300 [P.O.:300] Out: 150 [Urine:150] Intake/Output from this shift: Total I/O In: -  Out: 100 [Urine:100]  Labs:  Recent Labs  01/23/14 2300 01/24/14 0340 01/25/14 0851 01/26/14 0516  WBC 10.6* 9.4 8.8  --   HGB 10.2* 9.5* 9.4*  --   PLT 403* 377 370  --   CREATININE 4.03* 4.30* 5.02* 5.30*   Estimated Creatinine Clearance: 7.9 ml/min (by C-G formula based on Cr of 5.3). No results found for this basename: VANCOTROUGH, Leodis BinetVANCOPEAK, VANCORANDOM, GENTTROUGH, GENTPEAK, GENTRANDOM, TOBRATROUGH, TOBRAPEAK, TOBRARND, AMIKACINPEAK, AMIKACINTROU, AMIKACIN,  in the last 72 hours   Microbiology: Recent Results (from the past 720 hour(s))  SURGICAL PCR SCREEN     Status: None   Collection Time    01/09/14 12:18 AM      Result Value Ref Range Status   MRSA, PCR NEGATIVE  NEGATIVE Final   Staphylococcus aureus NEGATIVE  NEGATIVE Final   Comment:            The Xpert SA Assay (FDA     approved for NASAL specimens     in patients over 72 years of age),     is one component of     a comprehensive surveillance     program.  Test performance has     been validated by [facility] for patients greater     than or equal to 824 year old.     It is not intended     to diagnose infection nor to     guide or monitor treatment.  URINE CULTURE     Status: None   Collection Time    01/23/14  1:34 PM      Result Value Ref Range Status   Specimen Description URINE, CLEAN CATCH    Final   Special Requests ADD [id number]   Final   Culture  Setup Time     Final   Value: 01/23/2014 17:34     Performed at [facility]   Colony Count     Final   Value: >=100,000 COLONIES/ML     Performed at [facility]   Culture     Final   Value: ENTEROBACTER AEROGENES     Performed at [facility]   Report Status 01/25/2014 FINAL   Final   Organism ID, Bacteria ENTEROBACTER AEROGENES   Final  MRSA PCR SCREENING     Status: None   Collection Time    01/23/14  9:16 PM      Result Value Ref Range Status   MRSA by PCR NEGATIVE  NEGATIVE Final   Comment:            The GeneXpert MRSA Assay (FDA     approved for NASAL specimens     only), is one component of a     comprehensive MRSA colonization     surveillance program. It is not     intended to diagnose MRSA  infection nor to guide or     monitor treatment for     MRSA infections.    Medical History: Past Medical History  Diagnosis Date  . CAD (coronary artery disease)   . Hypertension   . Diabetes mellitus     type 2 insulin dependent  . Hyperlipidemia   . CHF (congestive heart failure)   . Cellulitis     left foot  . Ulcer     diabetic ulcer on left foot  . DVT (deep venous thrombosis)     peroneal vein  . Peripheral vascular disease   . Chronic kidney disease   . Anemia   . Shortness of breath     with fluid  . Insomnia    Assessment: 73 yo F ESRD pt to start/continue vanc for wound infection.   She was on vancomycin for sacral decubitus stage II-III. 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   Her last HD was on 01/15/14.  Per RN and HD unit she refused HD yesterday, 4/14.  Vancomycin protocol was dc'd 4/14 but re-ordered 4/15 now that she has reversed her decision regarding HD.  She now agrees to HD and will have HD on Thurs and Sat of this week, then back to MWF schedule. She is on Cipro for enterbacter UTI. WBC 8.8. AF.   On cipro for enterobacter UTI.     4/12 vancomcyin 1 gm given in ED @ 1644 Ceftriaxone 4/12>>4/13 cipro 4/14>>  4/12 urine > 100K enterobacter sens Cipro  Goal of Therapy:  Pre-HD vanc level 15 - 25 mcg/ml  Plan:  1. Vancomycin 500 mg after HD on Thurs and Saturday and then after HD on MWF 2. F/u HD tolerance/schedule 3. Pre-HD vanc levels as needed 4. cipro 500 mg PO daily at 1800 Herby Abraham, Pharm.D. [phone] 01/26/2014 3:30 PM

## 2014-01-26 NOTE — Consult Note (Signed)
Reason for Consult:vision loss, diabetic retinopathy Referring Physician: Randall Hiss is an 73 y.o. female.  HPI: Vision loss of the left eye which was recently healthy enough to see the television and read large print.  Past Medical History  Diagnosis Date  . CAD (coronary artery disease)   . Hypertension   . Diabetes mellitus     type 2 insulin dependent  . Hyperlipidemia   . CHF (congestive heart failure)   . Cellulitis     left foot  . Ulcer     diabetic ulcer on left foot  . DVT (deep venous thrombosis)     peroneal vein  . Peripheral vascular disease   . Chronic kidney disease   . Anemia   . Shortness of breath     with fluid  . Insomnia     Past Surgical History  Procedure Laterality Date  . Tubal ligation    . Pr vein bypass graft,aorto-fem-pop  06-26-11    1. PATENT LEFT FEMORAL-POPLITEAL BYPASS GRAFT W/NO EVEIDENCE OF STENOSIS. 2.VELOCITIES OF GREATER THAN 200 CM/'s NOTED ON PREVIOUS EXAM 10/03/11 WERE NOT ADEQUATELY VISULAIZED DURING THIS EXAM  . Coronary artery bypass graft  04/25/08    Dr. Servando Snare, CABG x 5   . Nm myoview ltd  01/13/13    LEXISCAN; LV WALL MOTION: LVEF 44%, INFERIOR AKINESIS, ANTEROAPICAL HYPOKINESIS  . Cardiac catheterization  04/22/08    SEVERE 3-VESSEL DISEASE CAD., CABG X 5 04/25/08 WITH DR. JXBJYNWG  . Av fistula placement Left 09/07/2013    Procedure: ARTERIOVENOUS (AV) FISTULA CREATION;  Surgeon: Elam Dutch, MD;  Location: [facility] OR;  Service: Vascular;  Laterality: Left;  . Ligation arteriovenous gortex graft Left 01/09/2014    Procedure: LIGATION ARTERIOVENOUS GORTEX GRAFT;  Surgeon: Elam Dutch, MD;  Location: [place];  Service: Vascular;  Laterality: Left;  . Insertion of dialysis catheter Right 01/09/2014    Procedure: INSERTION OF DIALYSIS CATHETER;  Surgeon: Elam Dutch, MD;  Location: [place];  Service: Vascular;  Laterality: Right;  Insertion of diatek to right internal Jugular artery.  . Amputation Right  01/12/2014    Procedure: AMPUTATION ABOVE KNEE- RIGHT;  Surgeon: Mal Misty, MD;  Location: [facility] OR;  Service: Vascular;  Laterality: Right;  . 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 Cancer Mother     Kidney  . Kidney disease Mother   . Cancer Father     skin    Social History:  reports that she quit smoking about 17 years ago. Her smoking use included Cigarettes. She has a 40 pack-year smoking history. She has quit using smokeless tobacco. Her smokeless tobacco use included Snuff. She reports that she does not drink alcohol or use illicit drugs.  Allergies:  Allergies  Allergen Reactions  . Ivp Dye [Iodinated Diagnostic Agents] Hives and Rash  . Aspirin Hives    Medications: I have reviewed the patient's current medications.  Results for orders placed during the hospital encounter of 01/23/14 (from the past 48 hour(s))  GLUCOSE, CAPILLARY     Status: None   Collection Time    01/24/14  8:31 PM      Result Value Ref Range   Glucose-Capillary 88  70 - 99 mg/dL  GLUCOSE, CAPILLARY     Status: None   Collection Time    01/25/14  7:57 AM      Result Value Ref Range  Glucose-Capillary 82  70 - 99 mg/dL  CBC     Status: Abnormal   Collection Time    01/25/14  8:51 AM      Result Value Ref Range   WBC 8.8  4.0 - 10.5 K/uL   RBC 3.62 (*) 3.87 - 5.11 MIL/uL   Hemoglobin 9.4 (*) 12.0 - 15.0 g/dL   HCT 30.5 (*) 36.0 - 46.0 %   MCV 84.3  78.0 - 100.0 fL   MCH 26.0  26.0 - 34.0 pg   MCHC 30.8  30.0 - 36.0 g/dL   RDW 17.6 (*) 11.5 - 15.5 %   Platelets 370  150 - 400 K/uL  RENAL FUNCTION PANEL     Status: Abnormal   Collection Time    01/25/14  8:51 AM      Result Value Ref Range   Sodium 137  137 - 147 mEq/L   Potassium 4.0  3.7 - 5.3 mEq/L   Chloride 93 (*) 96 - 112 mEq/L   CO2 28  19 - 32 mEq/L   Glucose, Bld 89  70 - 99 mg/dL   BUN 41 (*) 6 - 23 mg/dL   Creatinine, Ser 5.02 (*) 0.50 - 1.10 mg/dL   Calcium 9.8  8.4 -  10.5 mg/dL   Phosphorus 2.2 (*) 2.3 - 4.6 mg/dL   Albumin 1.9 (*) 3.5 - 5.2 g/dL   GFR calc non Af Amer 8 (*) >90 mL/min   GFR calc Af Amer 9 (*) >90 mL/min   Comment: (NOTE)     The eGFR has been calculated using the CKD EPI equation.     This calculation has not been validated in all clinical situations.     eGFR's persistently <90 mL/min signify possible Chronic Kidney     Disease.  GLUCOSE, CAPILLARY     Status: Abnormal   Collection Time    01/25/14 11:53 AM      Result Value Ref Range   Glucose-Capillary 123 (*) 70 - 99 mg/dL  GLUCOSE, CAPILLARY     Status: Abnormal   Collection Time    01/25/14  4:31 PM      Result Value Ref Range   Glucose-Capillary 101 (*) 70 - 99 mg/dL  GLUCOSE, CAPILLARY     Status: None   Collection Time    01/25/14  9:14 PM      Result Value Ref Range   Glucose-Capillary 95  70 - 99 mg/dL  GLUCOSE, CAPILLARY     Status: None   Collection Time    01/26/14  2:35 AM      Result Value Ref Range   Glucose-Capillary 71  70 - 99 mg/dL  BASIC METABOLIC PANEL     Status: Abnormal   Collection Time    01/26/14  5:16 AM      Result Value Ref Range   Sodium 137  137 - 147 mEq/L   Potassium 3.8  3.7 - 5.3 mEq/L   Chloride 95 (*) 96 - 112 mEq/L   CO2 28  19 - 32 mEq/L   Glucose, Bld 77  70 - 99 mg/dL   BUN 48 (*) 6 - 23 mg/dL   Creatinine, Ser 5.30 (*) 0.50 - 1.10 mg/dL   Calcium 9.9  8.4 - 10.5 mg/dL   GFR calc non Af Amer 7 (*) >90 mL/min   GFR calc Af Amer 8 (*) >90 mL/min   Comment: (NOTE)     The eGFR has been calculated using the  CKD EPI equation.     This calculation has not been validated in all clinical situations.     eGFR's persistently <90 mL/min signify possible Chronic Kidney     Disease.  GLUCOSE, CAPILLARY     Status: Abnormal   Collection Time    01/26/14  7:26 AM      Result Value Ref Range   Glucose-Capillary 61 (*) 70 - 99 mg/dL  GLUCOSE, CAPILLARY     Status: None   Collection Time    01/26/14 11:19 AM      Result Value Ref  Range   Glucose-Capillary 78  70 - 99 mg/dL  GLUCOSE, CAPILLARY     Status: None   Collection Time    01/26/14  4:58 PM      Result Value Ref Range   Glucose-Capillary 84  70 - 99 mg/dL    No results found.  Review of Systems  Unable to perform ROS  Blood pressure 121/47, pulse 77, temperature 98.5 F (36.9 C), temperature source Oral, resp. rate 16, height 5' 3"  (1.6 m), weight 58.3 kg (128 lb 8.5 oz), SpO2 98.00%. Physical Exam  Nursing note and vitals reviewed. Constitutional: Vital signs are normal. She appears well-nourished. She is cooperative.  HENT:  Head: Normocephalic and atraumatic.  Eyes: Conjunctivae, EOM and lids are normal.    Vision right eye-20/400 near card, left eye bare light perception  Pupils are dilated at the bedside pharmacologically. Only moderate lens changes of nuclear sclerotic cataract is present, in each eye. Use of the fundus is via indirect ophthalmoscopy with a 20 diopter and 30 diopter lens. The right eye the retina is attached with scattered intraretinal hemorrhages. These findings are consistent with moderate to severe nonproliferative diabetic retinopathy. The patient was not cooperative enough to look left or right during the examination. It does appear that there may be neovascularizations superior to the optic nerve. The vitreous is otherwise clear thus, no specific ophthalmic intervention is required of the right eye at this time. The cup to disc ratio the right eye is approximately 0.4  Funduscopic findings of the left eye with the same lenses discloses nonpathologic, diffuse asteroid hyalosis. A small preretinal hemorrhage is noted inferiorly. Scattered intraretinal hemorrhages consistent with severe nonproliferative diabetic retinopathy is present. I do not see enough detail of the optic nerve to determine if in fact neovascularization of the disc is present. I can discern that the optic nerve in the left eye is somewhat atrophic, and diffusely  pale. The retinal artery does appear to be attenuated. There is no cherry-red spot however. There is no optic nerve edema. This finding I believe is most consistent with an old event, and not acute in occurrence. The cup to disc ratio the left eye appears to be 0.5 or 6. And it is a diffusely pale  Neurological: She is alert.  Psychiatric: She has a normal mood and affect. Her speech is normal. Thought content normal. She is slowed.  The patient is pleasant during examination and yet somewhat curiously is unable to follow simple commands of looking upwards , laterally or downward. She reports being able to see her plate of food which is at the bedside.     Assessment/Plan: 1. 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 #2. 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 With clear media in the right eye, it would do  well to have outpatient full clinical examination, and possibly laser panretinal photocoagulation if these findings are confirmed on  ophthalmologic examination at that time. 3. 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 This could explain her poor visual acuity. There is no optic nerve edema. With optic nerve edema, clear media and the retina attached, clearly the vision loss in the left eye is optic nerve related. There is no acute therapy appropriate at this time for the left eye. Etiologies would include possible central retinal artery occlusion, vitreal papillary traction syndrome with acute non-arteritic anterior ischemic optic neuropathy. This latter condition may in fact be the case as asteroid hyalosis typically has dense attachments to the optic nerve.   there is no specific therapy for this condition.  Plan 1. Schedule for outpatient clinical retinal examination upon patient's discharg,  with Dr. Dominica Severin a Docie Abramovich. The best time frame would be within the next 2-3 weeks. 2. I would also instruct the family to have all prior ophthalmologic  clinical examinations, old records available for the next appointment in the outpatient setting.  Please call for any questions.   Dominica Severin A Kordelia Severin 01/26/2014, 5:56 PM

## 2014-01-26 NOTE — Progress Notes (Signed)
Patient requesting not to take her PO medications this AM, will re-offer later in the day.  Dr. Sunnie Nielsen notified.  Will continue to monitor. [facility]

## 2014-01-26 NOTE — Consult Note (Signed)
Reason for Consult: necrotic decubitus ulcer Referring Physician: Dr. 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 She was found to have a sacral decubitus ulcer.  We have been asked to evaluate the patient for possible surgical debridement.  Majority of the history was obtained by her daughter.  Mrs. [name] states it initially began during her last prolonged hospitalization in March and April with some redness and overtime developed into a "sore."  The patient reports pain.  Not much drainage.  Denies fever or chills.  The patient is wheelchair bound.  She reports her appetite is fair, however, has an albumin of 1.9.  She is on plavix for history of CAD.  She has been refusing care, however, has decided to continue with dialysis and further treatment of pressure ulcer.  Past Medical History  Diagnosis Date  . CAD (coronary artery disease)   . Hypertension   . Diabetes mellitus     type 2 insulin dependent  . Hyperlipidemia   . CHF (congestive heart failure)   . Cellulitis     left foot  . Ulcer     diabetic ulcer on left foot  . DVT (deep venous thrombosis)     peroneal vein  . Peripheral vascular disease   . Chronic kidney disease   . Anemia   . Shortness of breath     with fluid  . Insomnia     Past Surgical History  Procedure Laterality Date  . Tubal ligation    . Pr vein bypass graft,aorto-fem-pop  06-26-11    1. PATENT LEFT FEMORAL-POPLITEAL BYPASS GRAFT W/NO EVEIDENCE OF STENOSIS. 2.VELOCITIES OF GREATER THAN 200 CM/'s NOTED ON PREVIOUS EXAM 10/03/11 WERE NOT ADEQUATELY VISULAIZED DURING THIS EXAM  . Coronary artery bypass graft  04/25/08    Dr. Servando Snare, CABG x 5   . Nm myoview ltd  01/13/13    LEXISCAN; LV WALL MOTION: LVEF 44%, INFERIOR AKINESIS, ANTEROAPICAL HYPOKINESIS  . Cardiac catheterization  04/22/08    SEVERE  3-VESSEL DISEASE CAD., CABG X 5 04/25/08 WITH DR. SHFWYOVZ  . Av fistula placement Left 09/07/2013    Procedure: ARTERIOVENOUS (AV) FISTULA CREATION;  Surgeon: Elam Dutch, MD;  Location: [facility] OR;  Service: Vascular;  Laterality: Left;  . Ligation arteriovenous gortex graft Left 01/09/2014    Procedure: LIGATION ARTERIOVENOUS GORTEX GRAFT;  Surgeon: Elam Dutch, MD;  Location: [place];  Service: Vascular;  Laterality: Left;  . Insertion of dialysis catheter Right 01/09/2014    Procedure: INSERTION OF DIALYSIS CATHETER;  Surgeon: Elam Dutch, MD;  Location: [place];  Service: Vascular;  Laterality: Right;  Insertion of diatek to right internal Jugular artery.  . Amputation Right 01/12/2014    Procedure: AMPUTATION ABOVE KNEE- RIGHT;  Surgeon: Mal Misty, MD;  Location: [facility] OR;  Service: Vascular;  Laterality: Right;  . 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 Cancer Mother     Kidney  . Kidney disease Mother   . Cancer Father     skin    Social History:  reports that she quit smoking about 17 years ago. Her smoking use included Cigarettes. She has a 40 pack-year smoking history. She has quit using smokeless tobacco. Her smokeless tobacco use included Snuff. She  reports that she does not drink alcohol or use illicit drugs.  Allergies:  Allergies  Allergen Reactions  . Ivp Dye [Iodinated Diagnostic Agents] Hives and Rash  . Aspirin Hives    Medications:  Scheduled Meds: . amLODipine  10 mg Oral Daily  . atorvastatin  40 mg Oral Daily  . carvedilol  12.5 mg Oral BID WC  . ciprofloxacin  500 mg Oral Q24H  . clopidogrel  75 mg Oral Daily  . collagenase   Topical Daily  . darbepoetin (ARANESP) injection - DIALYSIS  100 mcg Intravenous Q Wed-HD  . doxercalciferol  2 mcg Intravenous Once  . feeding supplement (NEPRO CARB STEADY)  237 mL Oral TID WC  . folic acid  1 mg Oral Daily  . heparin  1,800 Units Dialysis Once in dialysis   . heparin subcutaneous  5,000 Units Subcutaneous 3 times per day  . hydrALAZINE  25 mg Oral BID  . insulin aspart  0-9 Units Subcutaneous TID WC  . insulin glargine  5 Units Subcutaneous QHS  . isosorbide mononitrate  30 mg Oral Daily  . lidocaine-EPINEPHrine  20 mL Intradermal Once  . mirtazapine  7.5 mg Oral QHS  . multivitamin  1 tablet Oral QHS  . omega-3 acid ethyl esters  1 g Oral QID  . pantoprazole  40 mg Oral Daily  . sodium chloride  3 mL Intravenous Q12H  . [START ON 01/27/2014] vancomycin  500 mg Intravenous Q T,Th,Sa-HD   Followed by  . [START ON 01/31/2014] vancomycin  500 mg Intravenous Q M,W,F-HD  . vitamin C  500 mg Oral Daily  . zinc sulfate  220 mg Oral Daily   Continuous Infusions:  PRN Meds:.sodium chloride, sodium chloride, sodium chloride, acetaminophen, acetaminophen, albuterol, alum & mag hydroxide-simeth, diphenhydrAMINE, feeding supplement (NEPRO CARB STEADY), guaiFENesin-dextromethorphan, heparin, lidocaine (PF), lidocaine-prilocaine, nitroGLYCERIN, ondansetron (ZOFRAN) IV, ondansetron, oxyCODONE, pentafluoroprop-tetrafluoroeth, sodium chloride  Results for orders placed during the hospital encounter of 01/23/14 (from the past 48 hour(s))  GLUCOSE, CAPILLARY     Status: Abnormal   Collection Time    01/24/14  4:55 PM      Result Value Ref Range   Glucose-Capillary 169 (*) 70 - 99 mg/dL   Comment 1 Notify RN    GLUCOSE, CAPILLARY     Status: None   Collection Time    01/24/14  8:31 PM      Result Value Ref Range   Glucose-Capillary 88  70 - 99 mg/dL  GLUCOSE, CAPILLARY     Status: None   Collection Time    01/25/14  7:57 AM      Result Value Ref Range   Glucose-Capillary 82  70 - 99 mg/dL  CBC     Status: Abnormal   Collection Time    01/25/14  8:51 AM      Result Value Ref Range   WBC 8.8  4.0 - 10.5 K/uL   RBC 3.62 (*) 3.87 - 5.11 MIL/uL   Hemoglobin 9.4 (*) 12.0 - 15.0 g/dL   HCT 30.5 (*) 36.0 - 46.0 %   MCV 84.3  78.0 - 100.0 fL   MCH 26.0   26.0 - 34.0 pg   MCHC 30.8  30.0 - 36.0 g/dL   RDW 17.6 (*) 11.5 - 15.5 %   Platelets 370  150 - 400 K/uL  RENAL FUNCTION PANEL     Status: Abnormal   Collection Time    01/25/14  8:51 AM      Result  Value Ref Range   Sodium 137  137 - 147 mEq/L   Potassium 4.0  3.7 - 5.3 mEq/L   Chloride 93 (*) 96 - 112 mEq/L   CO2 28  19 - 32 mEq/L   Glucose, Bld 89  70 - 99 mg/dL   BUN 41 (*) 6 - 23 mg/dL   Creatinine, Ser 5.02 (*) 0.50 - 1.10 mg/dL   Calcium 9.8  8.4 - 10.5 mg/dL   Phosphorus 2.2 (*) 2.3 - 4.6 mg/dL   Albumin 1.9 (*) 3.5 - 5.2 g/dL   GFR calc non Af Amer 8 (*) >90 mL/min   GFR calc Af Amer 9 (*) >90 mL/min   Comment: (NOTE)     The eGFR has been calculated using the CKD EPI equation.     This calculation has not been validated in all clinical situations.     eGFR's persistently <90 mL/min signify possible Chronic Kidney     Disease.  GLUCOSE, CAPILLARY     Status: Abnormal   Collection Time    01/25/14 11:53 AM      Result Value Ref Range   Glucose-Capillary 123 (*) 70 - 99 mg/dL  GLUCOSE, CAPILLARY     Status: Abnormal   Collection Time    01/25/14  4:31 PM      Result Value Ref Range   Glucose-Capillary 101 (*) 70 - 99 mg/dL  GLUCOSE, CAPILLARY     Status: None   Collection Time    01/25/14  9:14 PM      Result Value Ref Range   Glucose-Capillary 95  70 - 99 mg/dL  GLUCOSE, CAPILLARY     Status: None   Collection Time    01/26/14  2:35 AM      Result Value Ref Range   Glucose-Capillary 71  70 - 99 mg/dL  BASIC METABOLIC PANEL     Status: Abnormal   Collection Time    01/26/14  5:16 AM      Result Value Ref Range   Sodium 137  137 - 147 mEq/L   Potassium 3.8  3.7 - 5.3 mEq/L   Chloride 95 (*) 96 - 112 mEq/L   CO2 28  19 - 32 mEq/L   Glucose, Bld 77  70 - 99 mg/dL   BUN 48 (*) 6 - 23 mg/dL   Creatinine, Ser 5.30 (*) 0.50 - 1.10 mg/dL   Calcium 9.9  8.4 - 10.5 mg/dL   GFR calc non Af Amer 7 (*) >90 mL/min   GFR calc Af Amer 8 (*) >90 mL/min   Comment:  (NOTE)     The eGFR has been calculated using the CKD EPI equation.     This calculation has not been validated in all clinical situations.     eGFR's persistently <90 mL/min signify possible Chronic Kidney     Disease.  GLUCOSE, CAPILLARY     Status: Abnormal   Collection Time    01/26/14  7:26 AM      Result Value Ref Range   Glucose-Capillary 61 (*) 70 - 99 mg/dL  GLUCOSE, CAPILLARY     Status: None   Collection Time    01/26/14 11:19 AM      Result Value Ref Range   Glucose-Capillary 78  70 - 99 mg/dL    No results found.  Review of Systems  All other systems reviewed and are negative.  Blood pressure 121/47, pulse 77, temperature 98.5 F (36.9 C), temperature source Oral, resp. rate  16, height 5' 3"  (1.6 m), weight 128 lb 8.5 oz (58.3 kg), SpO2 98.00%. Physical Exam  Constitutional: She is oriented to person, place, and time. She appears well-developed and well-nourished. No distress.  Cardiovascular: Normal rate, regular rhythm, normal heart sounds and intact distal pulses.  Exam reveals no gallop and no friction rub.   No murmur heard. Respiratory: Effort normal and breath sounds normal. No respiratory distress. She has no wheezes. She has no rales. She exhibits no tenderness.  GI: Soft. Bowel sounds are normal. She exhibits no distension and no mass. There is no tenderness. There is no rebound and no guarding.  Musculoskeletal: Normal range of motion. She exhibits no edema and no tenderness.  Neurological: She is alert and oriented to person, place, and time.  Skin: She is not diaphoretic.  Right AKA, staples in place.  Psychiatric: She has a normal mood and affect. Her behavior is normal. Judgment and thought content normal.    4x3.5cm--sacrum.  Left buttock 2x2cm, right buttock 2.5x1  Assessment/Plan: Necrotic sacral ulcer, unable to stage -will proceed with a bedside debridement tomorrow morning 4/16.  I discussed this with the patient and her daughter, Mrs.  [name].  Risks of the procedure reviewed including but not limited to bleeding, infection and need for further surgical debridement were discussed.   -continue with santyl -we will consider hydrotherapy following the debridement -recommend maximizing nutrition(albumin 1.9) to promote wound healing, frequent turning and air mattress overlay.  -will follow up in AM  Brando Taves ANP-BC 01/26/2014, 3:33 PM

## 2014-01-26 NOTE — Progress Notes (Signed)
S: Pt has changed her mind and now wishes to cont with HD.  Pain still not controlled but meds adjusted O:BP 121/47  Pulse 77  Temp(Src) 98.5 F (36.9 C) (Oral)  Resp 16  Ht 5\' 3"  (1.6 m)  Wt 58.3 kg (128 lb 8.5 oz)  BMI 22.77 kg/m2  SpO2 98%  Intake/Output Summary (Last 24 hours) at 01/26/14 1512 Last data filed at 01/26/14 1200  Gross per 24 hour  Intake     60 ml  Output    250 ml  Net   -190 ml   Weight change:  IRJ:JOACZ and alert CVS:RRR Resp:clear Abd:+ BS NTND Ext:Rt AKA, no edema, Dry gangrene of lt 5th digit NEURO:CNI Ox3 No asterixis Rt IJ permcath   . amLODipine  10 mg Oral Daily  . atorvastatin  40 mg Oral Daily  . calcitRIOL  0.5 mcg Oral Daily  . carvedilol  12.5 mg Oral BID WC  . ciprofloxacin  500 mg Oral Q24H  . clopidogrel  75 mg Oral Daily  . collagenase   Topical Daily  . darbepoetin (ARANESP) injection - DIALYSIS  100 mcg Intravenous Q Wed-HD  . doxercalciferol  2 mcg Intravenous Once  . feeding supplement (NEPRO CARB STEADY)  237 mL Oral TID WC  . ferrous sulfate  325 mg Oral BID  . folic acid  1 mg Oral Daily  . heparin  1,800 Units Dialysis Once in dialysis  . heparin subcutaneous  5,000 Units Subcutaneous 3 times per day  . hydrALAZINE  25 mg Oral BID  . insulin aspart  0-9 Units Subcutaneous TID WC  . insulin glargine  5 Units Subcutaneous QHS  . isosorbide mononitrate  30 mg Oral Daily  . lanthanum  500 mg Oral TID WC  . mirtazapine  7.5 mg Oral QHS  . multivitamin  1 tablet Oral QHS  . omega-3 acid ethyl esters  1 g Oral QID  . pantoprazole  40 mg Oral Daily  . sodium chloride  3 mL Intravenous Q12H  . vitamin C  500 mg Oral Daily  . zinc sulfate  220 mg Oral Daily   No results found. BMET    Component Value Date/Time   NA 137 01/26/2014 0516   K 3.8 01/26/2014 0516   CL 95* 01/26/2014 0516   CO2 28 01/26/2014 0516   GLUCOSE 77 01/26/2014 0516   BUN 48* 01/26/2014 0516   CREATININE 5.30* 01/26/2014 0516   CALCIUM 9.9 01/26/2014  0516   CALCIUM 8.7 07/01/2011 1831   GFRNONAA 7* 01/26/2014 0516   GFRAA 8* 01/26/2014 0516   CBC    Component Value Date/Time   WBC 8.8 01/25/2014 0851   RBC 3.62* 01/25/2014 0851   HGB 9.4* 01/25/2014 0851   HCT 30.5* 01/25/2014 0851   PLT 370 01/25/2014 0851   MCV 84.3 01/25/2014 0851   MCH 26.0 01/25/2014 0851   MCHC 30.8 01/25/2014 0851   RDW 17.6* 01/25/2014 0851   LYMPHSABS 1.1 01/23/2014 1139   MONOABS 0.6 01/23/2014 1139   EOSABS 0.2 01/23/2014 1139   BASOSABS 0.0 01/23/2014 1139     Assessment: 1. ESRD 2. Anemia on aranesp 3. Sec HPTH on hectorol 4. SP Rt AKA 5. HTN Plan: 1. Plan HD.  Not sure I can get her on tonight so will plan HD thurs and Sat this week and get back on MWF schedule next week 2.  DC PO iron. It will only worsen constipation of narcotics.  Will give IV  if she needs iron 3. DC calcitriol 4.  Dc fosrenol as PO4 low  Dyke MaesMichael T Lai Hendriks

## 2014-01-26 NOTE — Consult Note (Signed)
Will perform bedside debridement tomorrow.  Patient not a good candidate for general anesthetic and surgical debridement.  Wilmon Arms. Corliss Skains, MD, [facility] Surgery  General/ Trauma Surgery  01/26/2014 4:59 PM

## 2014-01-27 LAB — RENAL FUNCTION PANEL
ALBUMIN: 1.9 g/dL — AB (ref 3.5–5.2)
BUN: 49 mg/dL — ABNORMAL HIGH (ref 6–23)
CHLORIDE: 92 meq/L — AB (ref 96–112)
CO2: 26 meq/L (ref 19–32)
Calcium: 9.7 mg/dL (ref 8.4–10.5)
Creatinine, Ser: 5.78 mg/dL — ABNORMAL HIGH (ref 0.50–1.10)
GFR, EST AFRICAN AMERICAN: 8 mL/min — AB (ref >90)
GFR, EST NON AFRICAN AMERICAN: 7 mL/min — AB (ref >90)
Glucose, Bld: 95 mg/dL (ref 70–99)
Phosphorus: 3 mg/dL (ref 2.3–4.6)
Potassium: 4.4 mEq/L (ref 3.7–5.3)
SODIUM: 135 meq/L — AB (ref 137–147)

## 2014-01-27 LAB — CBC
HCT: 30.7 % — ABNORMAL LOW (ref 36.0–46.0)
HEMOGLOBIN: 9.6 g/dL — AB (ref 12.0–15.0)
MCH: 26.1 pg (ref 26.0–34.0)
MCHC: 31.3 g/dL (ref 30.0–36.0)
MCV: 83.4 fL (ref 78.0–100.0)
Platelets: 372 10*3/uL (ref 150–400)
RBC: 3.68 MIL/uL — ABNORMAL LOW (ref 3.87–5.11)
RDW: 17.5 % — ABNORMAL HIGH (ref 11.5–15.5)
WBC: 12.5 10*3/uL — ABNORMAL HIGH (ref 4.0–10.5)

## 2014-01-27 LAB — GLUCOSE, CAPILLARY
GLUCOSE-CAPILLARY: 109 mg/dL — AB (ref 70–99)
GLUCOSE-CAPILLARY: 42 mg/dL — AB (ref 70–99)
GLUCOSE-CAPILLARY: 83 mg/dL (ref 70–99)
Glucose-Capillary: 39 mg/dL — CL (ref 70–99)
Glucose-Capillary: 84 mg/dL (ref 70–99)

## 2014-01-27 MED ORDER — OXYCODONE HCL 5 MG PO TABS
ORAL_TABLET | ORAL | Status: AC
  Filled 2014-01-27: qty 2

## 2014-01-27 MED ORDER — DOXERCALCIFEROL 4 MCG/2ML IV SOLN
INTRAVENOUS | Status: AC
  Filled 2014-01-27: qty 2

## 2014-01-27 MED ORDER — DEXTROSE 50 % IV SOLN
INTRAVENOUS | Status: AC
  Administered 2014-01-27: 25 mL
  Filled 2014-01-27: qty 50

## 2014-01-27 NOTE — Procedures (Signed)
Wilmon Arms. Corliss Skains, MD, [facility] Surgery  General/ Trauma Surgery  01/27/2014 10:16 AM

## 2014-01-27 NOTE — Progress Notes (Signed)
S: pain better controlled.  BS low this AM O:BP 148/68  Pulse 74  Temp(Src) 98.3 F (36.8 C) (Oral)  Resp 16  Ht 5\' 3"  (1.6 m)  Wt 58.3 kg (128 lb 8.5 oz)  BMI 22.77 kg/m2  SpO2 100%  Intake/Output Summary (Last 24 hours) at 01/27/14 0808 Last data filed at 01/26/14 2040  Gross per 24 hour  Intake    590 ml  Output    250 ml  Net    340 ml   Weight change:  TAV:WPVXY and alert CVS:RRR Resp:clear Abd:+ BS NTND Ext:Rt AKA, no edema, Dry gangrene of lt 5th digit NEURO:CNI Ox3 No asterixis Rt IJ permcath   . amLODipine  10 mg Oral Daily  . atorvastatin  40 mg Oral Daily  . carvedilol  12.5 mg Oral BID WC  . ciprofloxacin  500 mg Oral Q24H  . clopidogrel  75 mg Oral Daily  . collagenase   Topical Daily  . darbepoetin (ARANESP) injection - DIALYSIS  100 mcg Intravenous Q Wed-HD  . dextrose      . doxercalciferol  2 mcg Intravenous Once  . feeding supplement (NEPRO CARB STEADY)  237 mL Oral TID WC  . folic acid  1 mg Oral Daily  . heparin  1,800 Units Dialysis Once in dialysis  . heparin subcutaneous  5,000 Units Subcutaneous 3 times per day  . hydrALAZINE  25 mg Oral BID  . insulin aspart  0-9 Units Subcutaneous TID WC  . isosorbide mononitrate  30 mg Oral Daily  . lidocaine-EPINEPHrine  20 mL Intradermal Once  . mirtazapine  7.5 mg Oral QHS  . multivitamin  1 tablet Oral QHS  . omega-3 acid ethyl esters  1 g Oral QID  . pantoprazole  40 mg Oral Daily  . sodium chloride  3 mL Intravenous Q12H  . vancomycin  500 mg Intravenous Q T,Th,Sa-HD   Followed by  . [START ON 01/31/2014] vancomycin  500 mg Intravenous Q M,W,F-HD  . vitamin C  500 mg Oral Daily  . zinc sulfate  220 mg Oral Daily   No results found. BMET    Component Value Date/Time   NA 137 01/26/2014 0516   K 3.8 01/26/2014 0516   CL 95* 01/26/2014 0516   CO2 28 01/26/2014 0516   GLUCOSE 77 01/26/2014 0516   BUN 48* 01/26/2014 0516   CREATININE 5.30* 01/26/2014 0516   CALCIUM 9.9 01/26/2014 0516   CALCIUM  8.7 07/01/2011 1831   GFRNONAA 7* 01/26/2014 0516   GFRAA 8* 01/26/2014 0516   CBC    Component Value Date/Time   WBC 8.8 01/25/2014 0851   RBC 3.62* 01/25/2014 0851   HGB 9.4* 01/25/2014 0851   HCT 30.5* 01/25/2014 0851   PLT 370 01/25/2014 0851   MCV 84.3 01/25/2014 0851   MCH 26.0 01/25/2014 0851   MCHC 30.8 01/25/2014 0851   RDW 17.6* 01/25/2014 0851   LYMPHSABS 1.1 01/23/2014 1139   MONOABS 0.6 01/23/2014 1139   EOSABS 0.2 01/23/2014 1139   BASOSABS 0.0 01/23/2014 1139     Assessment: 1. ESRD 2. Anemia on aranesp 3. Sec HPTH on hectorol 4. SP Rt AKA 5. HTN Plan: 1. Plan HD today  Dyke Maes

## 2014-01-27 NOTE — Progress Notes (Signed)
Patient is currently active with [facility] Care Management for chronic disease management services.  Patient has been engaged by a [employer]. Received request from [facility] to call patient's daughter Earl Lagos [phone]) regarding a ramp needed at the home.  Called daughters number and left a voice message requesting a call back at 10:58am.  [facility] continues to recommend SNF level care for this patient.  Expressed the sam to [facility]  Will await call back from daughter to engage coordination of goals of care if it is found appropriate for her to discharge to home.  Patient will receive a post discharge transition of care call and will be evaluated for monthly home visits for assessments and disease process education.  Made Inpatient Case Manager aware that [facility] Care Management following. Of note, [facility] Care Management services does not replace or interfere with any services that are arranged by inpatient case management or social work.  For additional questions or referrals please contact Anibal Henderson BSN RN [facility] Liaison at [phone].

## 2014-01-27 NOTE — Progress Notes (Signed)
Subjective: Pt c/o pain on her bottom.  Says she's not sure if she's got much feeling back there.  No other complaints.  Objective: Vital signs in last 24 hours: Temp:  [98.3 F (36.8 C)-98.5 F (36.9 C)] 98.3 F (36.8 C) (04/15 2031) Pulse Rate:  [74-77] 74 (04/15 2031) Resp:  [16] 16 (04/15 2031) BP: (121-148)/(47-68) 148/68 mmHg (04/15 2031) SpO2:  [98 %-100 %] 100 % (04/15 2031) Last BM Date: 01/25/14  Intake/Output from previous day: 04/15 0701 - 04/16 0700 In: 590 [P.O.:590] Out: 250 [Urine:250] Intake/Output this shift:    PE: Gen:  Alert, NAD, pleasant Skin:  Sacrum 4 x 2.5cm, left buttock 2cm x 1.5 cm, right buttock 2.5cm x 1cm    Lab Results:   Recent Labs  01/25/14 0851  WBC 8.8  HGB 9.4*  HCT 30.5*  PLT 370   BMET  Recent Labs  01/25/14 0851 01/26/14 0516  NA 137 137  K 4.0 3.8  CL 93* 95*  CO2 28 28  GLUCOSE 89 77  BUN 41* 48*  CREATININE 5.02* 5.30*  CALCIUM 9.8 9.9   PT/INR No results found for this basename: LABPROT, INR,  in the last 72 hours CMP     Component Value Date/Time   NA 137 01/26/2014 0516   K 3.8 01/26/2014 0516   CL 95* 01/26/2014 0516   CO2 28 01/26/2014 0516   GLUCOSE 77 01/26/2014 0516   BUN 48* 01/26/2014 0516   CREATININE 5.30* 01/26/2014 0516   CALCIUM 9.9 01/26/2014 0516   CALCIUM 8.7 07/01/2011 1831   PROT 7.1 01/23/2014 1139   ALBUMIN 1.9* 01/25/2014 0851   AST 32 01/23/2014 1139   ALT 15 01/23/2014 1139   ALKPHOS 134* 01/23/2014 1139   BILITOT 0.4 01/23/2014 1139   GFRNONAA 7* 01/26/2014 0516   GFRAA 8* 01/26/2014 0516   Lipase  No results found for this basename: lipase       Studies/Results: No results found.  Anti-infectives: Anti-infectives   Start     Dose/Rate Route Frequency Ordered Stop   01/31/14 1200  vancomycin (VANCOCIN) 500 mg in sodium chloride 0.9 % 100 mL IVPB     500 mg 100 mL/hr over 60 Minutes Intravenous Every M-W-F (Hemodialysis) 01/26/14 1532     01/27/14 1200  vancomycin  (VANCOCIN) 500 mg in sodium chloride 0.9 % 100 mL IVPB     500 mg 100 mL/hr over 60 Minutes Intravenous Every T-Th-Sa (Hemodialysis) 01/26/14 1532 02/01/14 1159   01/25/14 1800  ciprofloxacin (CIPRO) tablet 500 mg     500 mg Oral Every 24 hours 01/25/14 1405     01/25/14 1400  vancomycin (VANCOCIN) IVPB 750 mg/150 ml premix  Status:  Discontinued     750 mg 150 mL/hr over 60 Minutes Intravenous Every 48 hours 01/23/14 2032 01/23/14 2112   01/25/14 1200  vancomycin (VANCOCIN) 500 mg in sodium chloride 0.9 % 100 mL IVPB  Status:  Discontinued     500 mg 100 mL/hr over 60 Minutes Intravenous Every T-Th-Sa (Hemodialysis) 01/24/14 1519 01/25/14 1404   01/24/14 1500  cefTRIAXone (ROCEPHIN) 1 g in dextrose 5 % 50 mL IVPB  Status:  Discontinued     1 g 100 mL/hr over 30 Minutes Intravenous Every 24 hours 01/23/14 2011 01/25/14 1404   01/24/14 1200  vancomycin (VANCOCIN) 500 mg in sodium chloride 0.9 % 100 mL IVPB  Status:  Discontinued     500 mg 100 mL/hr over 60 Minutes Intravenous Every M-W-F (Hemodialysis)  01/23/14 2113 01/24/14 1519   01/23/14 1600  vancomycin (VANCOCIN) IVPB 1000 mg/200 mL premix     1,000 mg 200 mL/hr over 60 Minutes Intravenous  Once 01/23/14 1551 01/23/14 1811   01/23/14 1515  cefTRIAXone (ROCEPHIN) 1 g in dextrose 5 % 50 mL IVPB     1 g 100 mL/hr over 30 Minutes Intravenous  Once 01/23/14 1503 01/23/14 1641   01/23/14 1515  vancomycin (VANCOCIN) injection 1,000 mg  Status:  Discontinued     1,000 mg Intravenous  Once 01/23/14 1503 01/23/14 1549       Assessment/Plan Necrotic sacral ulcer, unable to stage  -will proceed with a bedside debridement today 4/16. Re-discussed risks of the procedure reviewed including but not limited to bleeding, infection and need for further surgical debridement were discussed.  -d/c santyl -hold off on hydrotherapy -recommend maximizing nutrition(albumin 1.9) to promote wound healing, frequent turning and air mattress overlay.         LOS: 4 days    [name] 01/27/2014, 9:56 AM Pager: [phone]

## 2014-01-27 NOTE — Progress Notes (Signed)
Agree with above. Does not feel very deep.  The smaller areas of breakdown are likely to be superficial.  Will debride at bedside.  Wilmon Arms. Corliss Skains, MD, [facility] Surgery  General/ Trauma Surgery  01/27/2014 10:07 AM

## 2014-01-27 NOTE — Progress Notes (Signed)
   R AKA completed on 01/12/14 by Dr. Hart Rochester.  Staples do not come out for one month.  Pt has follow up in the office with Dr. Hart Rochester at one month.  Leonides Sake, MD [facility] Office: [phone] Pager: [phone]  01/27/2014, 4:01 PM

## 2014-01-27 NOTE — Progress Notes (Signed)
UR Completed Merida Alcantar Graves-Bigelow, RN,BSN [phone]  

## 2014-01-27 NOTE — Care Management Note (Signed)
    Page 1 of 2   02/02/2014     11:31:51 AM CARE MANAGEMENT NOTE 02/02/2014  Patient:  [name], [name]   Account Number:  [id number]  Date Initiated:  01/27/2014  Documentation initiated by:  GRAVES-BIGELOW,Jasan Doughtie  Subjective/Objective Assessment:   Pt admitted for CP. PMX: CAD, ESRD recently started on HD, PVD- recent R AKA, CHF and Sacral decubitus stage III. RN for dressing changes BID or more frrquently if needed.     Action/Plan:   CM has contacted [facility] Liaison Anibal Henderson in ref to pt. Daughter has called today in ref to disposition needs. Daughter states pt needs ramp for home and that [facility] has to get ramp placed. Daughter also requesting a wc for home.   Anticipated DC Date:  01/27/2014   Anticipated DC Plan:  SKILLED NURSING FACILITY      DC Planning Services  CM consult      Choice offered to / List presented to:             Status of service:  Completed, signed off Medicare Important Message given?   (If response is "NO", the following Medicare IM given date fields will be blank) Date Medicare IM given:   Date Additional Medicare IM given:    Discharge Disposition:  SKILLED NURSING FACILITY  Per UR Regulation:  Reviewed for med. necessity/level of care/duration of stay  If discussed at Long Length of Stay Meetings, dates discussed:   02/01/2014  02/03/2014    Comments:  02-02-14 1130 Pt plan for d/c today to [facility] and For HD at [facility]. CSW to assist with disposition needs.   01-31-14 Tomi Bamberger, RN,BSN [phone] Update: CSW is working to get pt into a SNF in [place] due to [place] [facility] does not do Hydrotherapy tx. CM will continue to monitor for disposition needs.    01-28-14 1555 Tomi Bamberger, [place] [phone] CM was able to speak to Daughter Earl Lagos and she stated she did want CIR for her mother and wanted SNF [facility] in [place] as a back up. However that is not how she stated the information  to  [facility] with CIR. CM did speak to CSW Amy- in reference to helping to assist with pt to SNF when medically stable. No further needs from CM at this time.    01-27-14 [address], [place] [phone] Pt may benefit more from SNF once medically stable for d/c due to wound care that will be needed. CM will continue to monitor for dispositon needs.

## 2014-01-27 NOTE — Procedures (Signed)
Pre-op Diagnosis: Decubitus ulcer sacrum/buttock, unstageable Post-op Diagnosis: Decubitus ulcer sacrum/buttock, stage III Procedure: Bedside debridement of stage III decubitus ulcer Surgeons: Aris Georgia PA-C Findings: Uninfected ulcer cavity with stage III, no purulent drainage, no abscess Anesthesia: Local with 20cc 1% Lidocaine with epinephrine Fluids: N/A Estimated blood loss: 27mL Drains: N/A Specimens: None Complications: No immediate complications Condition: Stable, good hemostasis  Procedure Details: Informed patient of risks (including those of bleeding, infection, and injury to other structures), benefits of procedure, and alternatives to the procedure. All questions were sought and answered. Written and verbal consent given by the patient and her daughter to proceed with the procedure.  She was placed in a prone position.  Sterile technique for procedure was done. Injected 20 cc of 1% lidocaine with epinephrine as a field block. Did a simple incision around the entire eschar & necrotic tissue of the sacrum, right buttock, and left buttock ulcers with 10 blade and pealed away skin and subcutaneous tissue to unroof the bed of the wound.  Final wound debridements measured:  Sacrum 4 x 3.5cm x  1cm deep, right buttock 2cm x 1cm x 67mm deep, left buttock 2cm x 1.5cm x 38mm deep.  I was able to get down to healthy bleeding tissue.  No abscess or drainage was identified.  No bone/muscle was involved.  Probed the wound for fluid collections and further necrosis and none was found.  The wound was packed with wet to dry dressings, and pressure bandage was applied.  The patient tolerated the procedure well.  Instructed nursing to change packing BID or more frequently as needed for saturation. Instructed patient to follow-up in the wound center for monitoring of this chronic wound.  She will need [facility] services for wound dressing changes if going home.   Left side of picture is superior, right side of  picture is inferior  Aris Georgia, [facility] Office: [phone] Pager:  [phone]

## 2014-01-27 NOTE — Progress Notes (Signed)
CRITICAL VALUE ALERT  Critical value received:  fsbs 39  Date of notification:  01/27/14  Time of notification:  0745  Critical value read back: y  Nurse who received alert:  Orbie Hurst  MD notified (1st page):  [place]al[place]   Time of first page:  0805  MD notified (2nd page):  Time of second page:  Responding MD:  Sunnie Nielsen  Time MD responded:  [phone]

## 2014-01-27 NOTE — Progress Notes (Signed)
[facility] PROGRESS NOTE  [name] MRN:[id number] DOB: 04/10/1941 DOA: 01/23/2014 PCP: KOHUT,WALTER DENNIS, MD  Assessment/Plan: Chest pain  Denies further pain..  Hx of significant CAD. troponins are negative. cardiology evaluated the pt and recommended not a candidate  cardiac cath and she is not a candidate for CABG. she has high bleeding risk on dual antiplatelet therapy following PCI.  Also has severe cardiomyopathy but is not a candidate for AICD Given co morbidities and poor long term prognosis..   Recommend optimal medical management. Increased dose of imdur. added hydralazine.  continue coreg. Patient has been allergy to aspirin. Started on plavix. -VQ scan with low probability for PE. -Chest pain has resolved.    Bilateral visual loss  Painless visual loss, has been going on for at least for the past 5 days. Seen by opthalmology Dr Patel and suggests findings of background proliferative retinopathy. opthalmology will consult retina specialist to evaluate. Her.CT head unremarkable. ESR and CRP mildly elevated. -needs tighter blood glucose control.  -Dr. Gary Rankin, a retina specialist. Has agreed to see [name] per Dr Patel note.  -Dr Rankin recommend out patient follow up in 2 to 3 weeks for further care.   UTI (lower urinary tract infection)  - urine cultures.( >100k colonies), Enterobacter. - Sensitive to ciprofloxacin.  -Continue with ciprofloxacin day 3.   Sacral decubitus stage II-III Minimal purulent discharge. ESR mildly elevated. Appreciate wound care evaluation. On santyl dressing Patient S/P debridement by surgery 4-15.  No need for antibiotics.  Will discontinue vancomycin.   Left little finger gangrene  - History of left hand steal syndrome.  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 No sign of infection.  Anemia  - Secondary to end-stage renal disease.  - Monitor hemoglobin and hematocrit, erythropoietin per nephrology.   ESRD on dialysis  Renal  following. HD per schedule.. Continue hectoral and Fosrenol with meals. Patient now wants to continue with dialysis.   DM type 2, uncontrolled, with renal complications  - Continue Lantus. 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 Retina specialist to see. Needs tighter blood glucose control. A1C of 8.1 and has elevated fsg. continue SSI. Hypoglycemia. Will discontinue lantus.   Multifocal atrial tachycardia  Currently stable on telemetry.  Chronic systolic heart failure  EF of 30-35% on recent echo. Appears compensated. Continue beta blocker, indoor and hydralazine.  Essential hypertension  - Stable, continue amlodipine, Coreg. Added hydralazine. Dose of Imdur increased. BP stable.  COPD (chronic obstructive pulmonary disease)  nebulized bronchodilators as needed  PVD (peripheral vascular disease)-status post recent right AKA  - Staples in place, no discharge erythema at the stump site.  -will Notify vascular for staples removal.  History of prior VTE  - Recently taken off anticoagulation.  - Review of past medical history shows a history of peroneal vein DVT, however a recent lower extremity Doppler was negative.  -Since VQ scan is negative will not repeat Doppler lower extremity    Code Status: DO NOT RESUSCITATE Family Communication: discussed with patient.  Disposition Plan: Pending palliaitve care meeting to be scheduled likely on 4/15   Consultants:  Cardiology  Ophthalmology  Renal  Wound care  palliative care  Procedures:  None  Antibiotics: IV vancomycin  cipro   HPI/Subjective: Patient denies chest pain. Her pain is sacral area.   Objective: Filed Vitals:   01/26/14 2031  BP: 148/68  Pulse: 74  Temp: 98.3 F (36.8 C)  Resp: 16    Intake/Output Summary (Last 24 hours) at 01/27/14   1230 Last data filed at 01/26/14 2040  Gross per 24 hour  Intake    350 ml  Output    150 ml  Net    200 ml   Filed Weights   01/23/14 2018  01/24/14 0500 01/25/14 0524  Weight: 57.38 kg (126 lb 8 oz) 59 kg (130 lb 1.1 oz) 58.3 kg (128 lb 8.5 oz)    Exam:   General:  Elderly female in NAD,   HEENT: no pallor, pupils reactive b/l, moist mucosa.   Chest: clear b/l  CVS: NS1&S2, no murmurs,   Abd: soft, NT ND BS+  Ext: warm, no edema, rt AKA with clean staples  CNS: AAOX3,   Data Reviewed: Basic Metabolic Panel:  Recent Labs Lab 01/23/14 1139 01/23/14 2300 01/24/14 0340 01/25/14 0851 01/26/14 0516  NA 137  --  135* 137 137  K 4.1  --  4.4 4.0 3.8  CL 92*  --  92* 93* 95*  CO2 26  --  27 28 28  GLUCOSE 208*  --  335* 89 77  BUN 21  --  29* 41* 48*  CREATININE 3.73* 4.03* 4.30* 5.02* 5.30*  CALCIUM 10.0  --  9.7 9.8 9.9  PHOS  --   --   --  2.2*  --    Liver Function Tests:  Recent Labs Lab 01/23/14 1139 01/25/14 0851  AST 32  --   ALT 15  --   ALKPHOS 134*  --   BILITOT 0.4  --   PROT 7.1  --   ALBUMIN 2.2* 1.9*   No results found for this basename: LIPASE, AMYLASE,  in the last 168 hours No results found for this basename: AMMONIA,  in the last 168 hours CBC:  Recent Labs Lab 01/23/14 1139 01/23/14 2300 01/24/14 0340 01/25/14 0851  WBC 10.7* 10.6* 9.4 8.8  NEUTROABS 8.8*  --   --   --   HGB 10.9* 10.2* 9.5* 9.4*  HCT 36.0 32.8* 30.6* 30.5*  MCV 87.4 85.4 85.5 84.3  PLT 430* 403* 377 370   Cardiac Enzymes:  Recent Labs Lab 01/23/14 1627 01/23/14 2300 01/24/14 0340 01/24/14 1000  TROPONINI <0.30 <0.30 <0.30 <0.30   BNP (last 3 results)  Recent Labs  08/17/13 1603 12/22/13 1809 01/23/14 1140  PROBNP 28521.0* >70000.0* >70000.0*   CBG:  Recent Labs Lab 01/26/14 2034 01/27/14 0739 01/27/14 0756 01/27/14 0816 01/27/14 1147  GLUCAP 116* 39* 42* 109* 83    Recent Results (from the past 240 hour(s))  URINE CULTURE     Status: None   Collection Time    01/23/14  1:34 PM      Result Value Ref Range Status   Specimen Description URINE, CLEAN CATCH   Final    Special Requests ADD [id number]   Final   Culture  Setup Time     Final   Value: 01/23/2014 17:34     Performed at [facility]   Colony Count     Final   Value: >=100,000 COLONIES/ML     Performed at [facility]   Culture     Final   Value: ENTEROBACTER AEROGENES     Performed at [facility]   Report Status 01/25/2014 FINAL   Final   Organism ID, Bacteria ENTEROBACTER AEROGENES   Final  MRSA PCR SCREENING     Status: None   Collection Time    01/23/14  9:16 PM      Result Value   Ref Range Status   MRSA by PCR NEGATIVE  NEGATIVE Final   Comment:            The GeneXpert MRSA Assay (FDA     approved for NASAL specimens     only), is one component of a     comprehensive MRSA colonization     surveillance program. It is not     intended to diagnose MRSA     infection nor to guide or     monitor treatment for     MRSA infections.     Studies: No results found.  Scheduled Meds: . amLODipine  10 mg Oral Daily  . atorvastatin  40 mg Oral Daily  . carvedilol  12.5 mg Oral BID WC  . ciprofloxacin  500 mg Oral Q24H  . clopidogrel  75 mg Oral Daily  . darbepoetin (ARANESP) injection - DIALYSIS  100 mcg Intravenous Q Wed-HD  . doxercalciferol  2 mcg Intravenous Once  . feeding supplement (NEPRO CARB STEADY)  237 mL Oral TID WC  . folic acid  1 mg Oral Daily  . heparin  1,800 Units Dialysis Once in dialysis  . heparin subcutaneous  5,000 Units Subcutaneous 3 times per day  . hydrALAZINE  25 mg Oral BID  . insulin aspart  0-9 Units Subcutaneous TID WC  . isosorbide mononitrate  30 mg Oral Daily  . lidocaine-EPINEPHrine  20 mL Intradermal Once  . mirtazapine  7.5 mg Oral QHS  . multivitamin  1 tablet Oral QHS  . omega-3 acid ethyl esters  1 g Oral QID  . pantoprazole  40 mg Oral Daily  . sodium chloride  3 mL Intravenous Q12H  . vancomycin  500 mg Intravenous Q T,Th,Sa-HD   Followed by  . [START ON 01/31/2014] vancomycin  500 mg Intravenous Q M,W,F-HD   . vitamin C  500 mg Oral Daily  . zinc sulfate  220 mg Oral Daily   Continuous Infusions:     Time spent: [facility] Pager [phone] If 7PM-7AM, please contact night-coverage at [email or web address], password [facility] 01/27/2014, 12:30 PM  LOS: 4 days

## 2014-01-27 NOTE — Consult Note (Signed)
I have reviewed this case with our NP and agree with the Assessment and Plan as stated.  Loda Bialas L. Makynlie Rossini, MD MBA The Palliative Medicine Team at [place] Team Phone: [phone] Pager: [phone]   

## 2014-01-28 DIAGNOSIS — E1129 Type 2 diabetes mellitus with other diabetic kidney complication: Secondary | ICD-10-CM

## 2014-01-28 DIAGNOSIS — L89109 Pressure ulcer of unspecified part of back, unspecified stage: Secondary | ICD-10-CM

## 2014-01-28 DIAGNOSIS — L8990 Pressure ulcer of unspecified site, unspecified stage: Secondary | ICD-10-CM

## 2014-01-28 DIAGNOSIS — E1165 Type 2 diabetes mellitus with hyperglycemia: Secondary | ICD-10-CM

## 2014-01-28 LAB — IRON AND TIBC
Iron: 16 ug/dL — ABNORMAL LOW (ref 42–135)
Saturation Ratios: 12 % — ABNORMAL LOW (ref 20–55)
TIBC: 134 ug/dL — ABNORMAL LOW (ref 250–470)
UIBC: 118 ug/dL — AB (ref 125–400)

## 2014-01-28 LAB — CBC
HCT: 30.8 % — ABNORMAL LOW (ref 36.0–46.0)
Hemoglobin: 9.4 g/dL — ABNORMAL LOW (ref 12.0–15.0)
MCH: 25.9 pg — ABNORMAL LOW (ref 26.0–34.0)
MCHC: 30.5 g/dL (ref 30.0–36.0)
MCV: 84.8 fL (ref 78.0–100.0)
Platelets: 311 10*3/uL (ref 150–400)
RBC: 3.63 MIL/uL — ABNORMAL LOW (ref 3.87–5.11)
RDW: 17.8 % — ABNORMAL HIGH (ref 11.5–15.5)
WBC: 11 10*3/uL — ABNORMAL HIGH (ref 4.0–10.5)

## 2014-01-28 LAB — GLUCOSE, CAPILLARY
GLUCOSE-CAPILLARY: 84 mg/dL (ref 70–99)
Glucose-Capillary: 166 mg/dL — ABNORMAL HIGH (ref 70–99)
Glucose-Capillary: 103 mg/dL — ABNORMAL HIGH (ref 70–99)
Glucose-Capillary: 130 mg/dL — ABNORMAL HIGH (ref 70–99)

## 2014-01-28 LAB — FERRITIN: Ferritin: 2434 ng/mL — ABNORMAL HIGH (ref 10–291)

## 2014-01-28 MED ORDER — TRAMADOL HCL 50 MG PO TABS
50.0000 mg | ORAL_TABLET | Freq: Three times a day (TID) | ORAL | Status: DC | PRN
  Administered 2014-01-28 – 2014-02-01 (×4): 50 mg via ORAL
  Filled 2014-01-28 (×4): qty 1

## 2014-01-28 MED ORDER — HYDROMORPHONE HCL PF 1 MG/ML IJ SOLN
0.5000 mg | Freq: Once | INTRAMUSCULAR | Status: AC
  Filled 2014-01-28: qty 1

## 2014-01-28 MED ORDER — HYDROMORPHONE HCL PF 1 MG/ML IJ SOLN
0.5000 mg | Freq: Once | INTRAMUSCULAR | Status: AC
  Administered 2014-01-28: 0.5 mg via INTRAVENOUS

## 2014-01-28 MED ORDER — HYDROMORPHONE HCL PF 1 MG/ML IJ SOLN
0.5000 mg | Freq: Once | INTRAMUSCULAR | Status: AC
  Administered 2014-01-28: 0.5 mg via INTRAVENOUS
  Filled 2014-01-28: qty 1

## 2014-01-28 NOTE — Progress Notes (Signed)
Local wound care as instructed.  Wilmon Arms. Corliss Skains, MD, [facility]  General/ Trauma Surgery  01/28/2014 9:25 AM

## 2014-01-28 NOTE — Evaluation (Signed)
Occupational Therapy Evaluation Patient Details Name: Rhonda CarrowMary P XXXBynum MRN: [id number] DOB: 01/14/1941 Today's Date: 01/28/2014    History of Present Illness 73 y.o. female [facility] with chest pain.  Triponins are (-), per cardiology she is not a candidate for cardiac cath.  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     Clinical Impression   This 73 yo female needing A pta due to recent R AKA admitted with above presents to acute OT with decreased vision, increased pain, decreased strength overall, decreased mobility, decreased safety (due to vision and recent AKA) all affecting pt's ability to care or A with her care. Will benefit from acute OT with follow up OT on CIR.    Follow Up Recommendations  CIR    Equipment Recommendations   (TBD at next venue)       Precautions / Restrictions Precautions Precautions: Fall      Mobility Bed Mobility   Bed Mobility: Sit to Supine       Sit to supine: Min guard      Transfers Overall transfer level: Needs assistance Equipment used: None Transfers: Sit to/from Stand Sit to Stand: Mod assist         General transfer comment: Mod assist to stand from bed x2, pt instructing PT on blocking her left knee during stand.  Pt pushing up over her left leg well knee blocked for safety, pt able to extend hips to almost fully upright standing and stood ~45 seconds both times.  She uses bil upper extremity to hold to therapist for balance and support while standing.     Balance Overall balance assessment: Needs assistance Sitting-balance support: Feet supported;No upper extremity supported (left foot supported) Sitting balance-Leahy Scale: Good     Standing balance support: Bilateral upper extremity supported Standing balance-Leahy Scale: Poor Standing balance  comment: needs support to stand.                             ADL                                         General ADL Comments: Pt needs A for all BADLS (sometimes more than others depending on how her vision is--see vision section). She combed her hair with setup/S EOB, dentures mod A at EOB.      Vision  Pt had 2 pieces of food on her gown when we arrived so I handed her a paper towel and asked her to clean it up. She looked down and wiped the gown where the two pieces were and while she was doing it she said, "and they say I can't see".  Later in the session she said, " I just don't understand, sometimes I can see and other times I cannot".                          Pertinent Vitals/Pain Buttocks/sacral area hurt (pressure areas)     Hand Dominance Right   Extremity/Trunk Assessment Upper Extremity Assessment Upper Extremity Assessment: Overall WFL for tasks assessed (steel syndrome left 4th-5th UE digits)   Lower Extremity Assessment Lower  Extremity Assessment: RLE deficits/detail;LLE deficits/detail RLE Deficits / Details: s/p AKA on 01/12/14, still has staples.   LLE Deficits / Details: Functional strength left leg 4/5   Cervical / Trunk Assessment Cervical / Trunk Assessment: Kyphotic;Other exceptions Cervical / Trunk Exceptions: forward head, rounded shoulders   Communication Communication Communication: No difficulties   Cognition Arousal/Alertness: Awake/alert Behavior During Therapy: WFL for tasks assessed/performed Overall Cognitive Status: No family/caregiver present to determine baseline cognitive functioning                 General Comments: Pt wanted to lay at foot of bed v. head of bed at end of session. Explained to her that foot of bed to raise like HOB and then she said well they both raise when I am laying at the HOB--explained that she was correct but that they raise differently. Told her I could let her lay at food  of bed flat or on  her side, but could not raise foot of bed up. SHe asked how many pillows she had and wanted to put her head on 3 of them and still lay down at foot of bed. So she asked appropriate questions and problem solved.              Home Living Family/patient expects to be discharged to:: Private residence Living Arrangements: Children (daughter) Available Help at Discharge: Family                                    Prior Functioning/Environment Level of Independence: Needs assistance        Comments: Amount unknown    OT Diagnosis: Generalized weakness;Blindness and low vision   OT Problem List: Decreased strength;Impaired balance (sitting and/or standing);Impaired vision/perception   OT Treatment/Interventions: Self-care/ADL training;Therapeutic activities;Patient/family education;Balance training;DME and/or AE instruction    OT Goals(Current goals can be found in the care plan section) Acute Rehab OT Goals Patient Stated Goal: to get stronger, decrease pain in buttocks OT Goal Formulation: With patient Time For Goal Achievement: 02/11/14 Potential to Achieve Goals: Good  OT Frequency: Min 2X/week           Co-evaluation PT/OT/SLP Co-Evaluation/Treatment: Yes (partial) Reason for Co-Treatment: Complexity of the patient's impairments (multi-system involvement) PT goals addressed during session: Mobility/safety with mobility;Balance;Proper use of DME;Strengthening/ROM OT goals addressed during session: ADL's and self-care;Strengthening/ROM      End of Session Equipment Utilized During Treatment: Gait belt Nurse Communication:  (NT: pt is laying in bed upside down from normal--due to pt 's request)  Activity Tolerance: Patient tolerated treatment well Patient left: in bed;with call bell/phone within reach;with bed alarm set (call bell modified to raise surface of nurse button due to decreased vision of pt)   Time: 1000-1024 OT Time  Calculation (min): 24 min Charges:  OT General Charges $OT Visit: 1 Procedure OT Evaluation $Initial OT Evaluation Tier I: 1 Procedure OT Treatments $Self Care/Home Management : 8-22 mins  [name] [phone] 01/28/2014, 11:20 AM

## 2014-01-28 NOTE — Progress Notes (Signed)
Subjective: Pt crying out for God/Jesus.   She says her bottom hurts.  She's tolerating dressing changes.  Wants to know if she'll get better.  Objective: Vital signs in last 24 hours: Temp:  [98.1 F (36.7 C)-98.9 F (37.2 C)] 98.8 F (37.1 C) (04/17 0500) Pulse Rate:  [64-87] 82 (04/17 0500) Resp:  [16-20] 20 (04/17 0500) BP: (97-162)/(48-90) 146/56 mmHg (04/17 0500) SpO2:  [98 %-100 %] 100 % (04/17 0500) Weight:  [124 lb 1.9 oz (56.3 kg)-127 lb 6.8 oz (57.8 kg)] 124 lb 1.9 oz (56.3 kg) (04/17 0000) Last BM Date: 01/25/14  Intake/Output from previous day: 04/16 0701 - 04/17 0700 In: -  Out: 3067 [Urine:100] Intake/Output this shift:    PE: Gen:  Alert, NAD, pleasant Buttock/sacrum:  Sacrum 4 x 3.5cm x 1cm deep, right buttock 2cm x 1cm x 5mm deep, left buttock 2cm x 1.5cm x 5mm deep.  Wounds are clean without slough, no purulent drainage erythema or edema.   Lab Results:   Recent Labs  01/27/14 2014 01/28/14 0500  WBC 12.5* 11.0*  HGB 9.6* 9.4*  HCT 30.7* 30.8*  PLT 372 311   BMET  Recent Labs  01/26/14 0516 01/27/14 2015  NA 137 135*  K 3.8 4.4  CL 95* 92*  CO2 28 26  GLUCOSE 77 95  BUN 48* 49*  CREATININE 5.30* 5.78*  CALCIUM 9.9 9.7   PT/INR No results found for this basename: LABPROT, INR,  in the last 72 hours CMP     Component Value Date/Time   NA 135* 01/27/2014 2015   K 4.4 01/27/2014 2015   CL 92* 01/27/2014 2015   CO2 26 01/27/2014 2015   GLUCOSE 95 01/27/2014 2015   BUN 49* 01/27/2014 2015   CREATININE 5.78* 01/27/2014 2015   CALCIUM 9.7 01/27/2014 2015   CALCIUM 8.7 07/01/2011 1831   PROT 7.1 01/23/2014 1139   ALBUMIN 1.9* 01/27/2014 2015   AST 32 01/23/2014 1139   ALT 15 01/23/2014 1139   ALKPHOS 134* 01/23/2014 1139   BILITOT 0.4 01/23/2014 1139   GFRNONAA 7* 01/27/2014 2015   GFRAA 8* 01/27/2014 2015   Lipase  No results found for this basename: lipase       Studies/Results: No results found.  Anti-infectives: Anti-infectives    Start     Dose/Rate Route Frequency Ordered Stop   01/31/14 1200  vancomycin (VANCOCIN) 500 mg in sodium chloride 0.9 % 100 mL IVPB  Status:  Discontinued     500 mg 100 mL/hr over 60 Minutes Intravenous Every M-W-F (Hemodialysis) 01/26/14 1532 01/27/14 1242   01/27/14 1200  vancomycin (VANCOCIN) 500 mg in sodium chloride 0.9 % 100 mL IVPB  Status:  Discontinued     500 mg 100 mL/hr over 60 Minutes Intravenous Every T-Th-Sa (Hemodialysis) 01/26/14 1532 01/27/14 1242   01/25/14 1800  ciprofloxacin (CIPRO) tablet 500 mg     500 mg Oral Every 24 hours 01/25/14 1405     01/25/14 1400  vancomycin (VANCOCIN) IVPB 750 mg/150 ml premix  Status:  Discontinued     750 mg 150 mL/hr over 60 Minutes Intravenous Every 48 hours 01/23/14 2032 01/23/14 2112   01/25/14 1200  vancomycin (VANCOCIN) 500 mg in sodium chloride 0.9 % 100 mL IVPB  Status:  Discontinued     500 mg 100 mL/hr over 60 Minutes Intravenous Every T-Th-Sa (Hemodialysis) 01/24/14 1519 01/25/14 1404   01/24/14 1500  cefTRIAXone (ROCEPHIN) 1 g in dextrose 5 % 50 mL IVPB  Status:  Discontinued     1 g 100 mL/hr over 30 Minutes Intravenous Every 24 hours 01/23/14 2011 01/25/14 1404   01/24/14 1200  vancomycin (VANCOCIN) 500 mg in sodium chloride 0.9 % 100 mL IVPB  Status:  Discontinued     500 mg 100 mL/hr over 60 Minutes Intravenous Every M-W-F (Hemodialysis) 01/23/14 2113 01/24/14 1519   01/23/14 1600  vancomycin (VANCOCIN) IVPB 1000 mg/200 mL premix     1,000 mg 200 mL/hr over 60 Minutes Intravenous  Once 01/23/14 1551 01/23/14 1811   01/23/14 1515  cefTRIAXone (ROCEPHIN) 1 g in dextrose 5 % 50 mL IVPB     1 g 100 mL/hr over 30 Minutes Intravenous  Once 01/23/14 1503 01/23/14 1641   01/23/14 1515  vancomycin (VANCOCIN) injection 1,000 mg  Status:  Discontinued     1,000 mg Intravenous  Once 01/23/14 1503 01/23/14 1549       Assessment/Plan Stage 3 Decubitus ulcer of buttock/sacrum POD #1 s/p bedside debridement  Plan: 1.   Continue BID dressing changes WD 2.  No need for antibiotics no cellulitis or abscess 3.  Okay to d/c home when medically stable to follow up with the wound care center for this chronic wound    LOS: 5 days    Aris Georgia 01/28/2014, 7:39 AM Pager: [phone]

## 2014-01-28 NOTE — Progress Notes (Signed)
Palliative continues to shadow chart. Please call for further needs as well. Thank you.  Yong Channel, NP Palliative Medicine Team Pager # [phone] (M-F 8a-5p) Team Phone # [phone] (Nights/Weekends)

## 2014-01-28 NOTE — Progress Notes (Signed)
Physical medicine and rehabilitation consult requested chart reviewed. 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 Patient readmitted 01/23/2014 for workup of chest pain. Spoke to daughter with recommendations at this time to be discharged to skilled [facility] facility [facility] [place] until home is equipped with ramp and necessary equipment. Social worker needs to address all discharge plans. On formal rehabilitation consult at this time until disposition plan has been confined. Please reconsult as needed

## 2014-01-28 NOTE — Progress Notes (Signed)
S: CO pain O:BP 146/56  Pulse 82  Temp(Src) 98.8 F (37.1 C) (Oral)  Resp 20  Ht 5\' 3"  (1.6 m)  Wt 56.3 kg (124 lb 1.9 oz)  BMI 21.99 kg/m2  SpO2 100%  Intake/Output Summary (Last 24 hours) at 01/28/14 0736 Last data filed at 01/28/14 0000  Gross per 24 hour  Intake      0 ml  Output   3067 ml  Net  -3067 ml   Weight change:  XYV:OPFYT and alert CVS:RRR Resp:clear Abd:+ BS NTND Ext:Rt AKA, no edema, Dry gangrene of lt 5th digit NEURO:CNI Ox3 No asterixis Rt IJ permcath   . amLODipine  10 mg Oral Daily  . atorvastatin  40 mg Oral Daily  . carvedilol  12.5 mg Oral BID WC  . ciprofloxacin  500 mg Oral Q24H  . clopidogrel  75 mg Oral Daily  . darbepoetin (ARANESP) injection - DIALYSIS  100 mcg Intravenous Q Wed-HD  . feeding supplement (NEPRO CARB STEADY)  237 mL Oral TID WC  . folic acid  1 mg Oral Daily  . heparin  1,800 Units Dialysis Once in dialysis  . heparin subcutaneous  5,000 Units Subcutaneous 3 times per day  . hydrALAZINE  25 mg Oral BID  . insulin aspart  0-9 Units Subcutaneous TID WC  . isosorbide mononitrate  30 mg Oral Daily  . lidocaine-EPINEPHrine  20 mL Intradermal Once  . mirtazapine  7.5 mg Oral QHS  . multivitamin  1 tablet Oral QHS  . omega-3 acid ethyl esters  1 g Oral QID  . oxyCODONE      . pantoprazole  40 mg Oral Daily  . sodium chloride  3 mL Intravenous Q12H  . vitamin C  500 mg Oral Daily  . zinc sulfate  220 mg Oral Daily   No results found. BMET    Component Value Date/Time   NA 135* 01/27/2014 2015   K 4.4 01/27/2014 2015   CL 92* 01/27/2014 2015   CO2 26 01/27/2014 2015   GLUCOSE 95 01/27/2014 2015   BUN 49* 01/27/2014 2015   CREATININE 5.78* 01/27/2014 2015   CALCIUM 9.7 01/27/2014 2015   CALCIUM 8.7 07/01/2011 1831   GFRNONAA 7* 01/27/2014 2015   GFRAA 8* 01/27/2014 2015   CBC    Component Value Date/Time   WBC 11.0* 01/28/2014 0500   RBC 3.63* 01/28/2014 0500   HGB 9.4* 01/28/2014 0500   HCT 30.8* 01/28/2014 0500   PLT 311  01/28/2014 0500   MCV 84.8 01/28/2014 0500   MCH 25.9* 01/28/2014 0500   MCHC 30.5 01/28/2014 0500   RDW 17.8* 01/28/2014 0500   LYMPHSABS 1.1 01/23/2014 1139   MONOABS 0.6 01/23/2014 1139   EOSABS 0.2 01/23/2014 1139   BASOSABS 0.0 01/23/2014 1139     Assessment: 1. ESRD 2. Anemia on aranesp 3. Sec HPTH on hectorol 4. SP Rt AKA 5. HTN 6. Enterobacter UTI on cipro Plan: 1. Plan HD tomorrow  Dyke Maes

## 2014-01-28 NOTE — Progress Notes (Signed)
[facility] LCSW has initiated the process to have a ramp constructed at the daughters home. Daughter was contacted with this information today.  RNCM aware.  Will continue to monitor.  Of note, [facility] Care Management services does not replace or interfere with any services that are arranged by inpatient case management or social work.  For additional questions or referrals please contact Anibal Henderson BSN RN [employer] [facility] Liaison at [phone].

## 2014-01-28 NOTE — Progress Notes (Signed)
Physical Therapy Treatment Patient Details Name: [name] MRN: [id number] DOB: 1941/05/25 Today's Date: 01/28/2014    History of Present Illness 73 y.o. female admitted to [facility] on 01/23/14 with chest pain.  Triponins are (-), per cardiology she is not a candidate for cardiac cath.  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      PT Comments    Pt is now agreeable and ready for OOB transfer to eat her lunch.  She is suprisingly strong standing on her left leg and needs the most support to pivot for balance and some stability at the left leg.  Pt's chair padded with extra pillows for added comfort and some relief of her buttocks in sitting. She continues to be appropriate for CIR level therapies and may even be able to stand and pivot with two person assist for safety with RW.  Pt to continue to follow acutely for deficits listed below.    Follow Up Recommendations  CIR     Equipment Recommendations  None recommended by PT    Recommendations for Other Services   NA     Precautions / Restrictions Precautions Precautions: Fall Precaution Comments: R AKA    Mobility  Bed Mobility Overal bed mobility: Needs Assistance Bed Mobility: Supine to Sit     Supine to sit: Min assist     General bed mobility comments: Min assist to give pt something to full on for leverage and support her trunk to prevent posterior LOB when first coming to sitting.   Transfers Overall transfer level: Needs assistance Equipment used: None Transfers: Sit to/from [employer] Sit to Stand: Min assist Stand pivot transfers: Mod assist       General transfer comment: Pt is able to push up from bed using armrest of recliner chair to help to get to standing over her left foot and then needd mod assist to support trunk as she  "hugged" PT with both arms during pivot to the left to the recliner chair.          Balance Overall balance assessment: Needs assistance Sitting-balance support: No upper extremity supported;Feet supported (left foot) Sitting balance-Leahy Scale: Good     Standing balance support: Bilateral upper extremity supported Standing balance-Leahy Scale: Poor Standing balance comment: needs upper extremity support to maintaing balance on just her left leg in standing.                     Cognition Arousal/Alertness: Awake/alert Behavior During Therapy: WFL for tasks assessed/performed Overall Cognitive Status: No family/caregiver present to determine baseline cognitive functioning                             Pertinent Vitals/Pain See vitals flow sheet.            PT Goals (current goals can now be found in the care plan section) Acute Rehab PT Goals Patient Stated Goal: to get stronger, decrease pain in buttocks Progress towards PT goals: Progressing toward goals    Frequency  Min 3X/week    PT Plan Current plan remains appropriate       End of Session Equipment Utilized During Treatment: Gait belt Activity Tolerance: Patient tolerated treatment well Patient left: in chair;with call bell/phone within reach  Time: [id number] PT Time Calculation (min): 11 min  Charges:  $Therapeutic Activity: 8-22 mins                      [name], PT, DPT [phone]   01/28/2014, 7:10 PM

## 2014-01-28 NOTE — Clinical Social Work Placement (Addendum)
Clinical Social Work Department  CLINICAL SOCIAL WORK PLACEMENT NOTE    Patient: [name]  Account Number: [id number]  Admit date: 01/23/14 Clinical Social Worker: Sabino Niemann LCSWA Date/time: 01/28/2014 11:30 AM  Clinical Social Work is seeking post-discharge placement for this patient at the following level of care: SKILLED NURSING (*CSW will update this form in Epic as items are completed)   01/28/2014  Patient/family provided with [facility] Department of Clinical Social Work's list of facilities offering this level of care within the geographic area requested by the patient (or if unable, by the patient's family).   01/28/2014  Patient/family informed of their freedom to choose among providers that offer the needed level of care, that participate in Medicare, Medicaid or managed care program needed by the patient, have an available bed and are willing to accept the patient.   01/28/2014  Patient/family informed of [facility]' ownership interest in [facility], as well as of the fact that they are under no obligation to receive care at this facility.  PASARR submitted to EDS on  Pre-exisitng  PASARR number received from EDS on   FL2 transmitted to all facilities in geographic area requested by pt/family on  01/28/2014  FL2 transmitted to all facilities within larger geographic area on  Patient informed that his/her managed care company has contracts with or will negotiate with certain facilities, including the following:  Patient/family informed of bed offers received:  01/29/14 Patient chooses bed at [facility] The Physician recommends and patient chooses bed at  Patient to be transferred to on 02/02/2014 Patient to be transferred to facility by [facility] The following physician request were entered in Epic:  Additional Comments:

## 2014-01-28 NOTE — Progress Notes (Addendum)
CSW attempted to get in contact with patient's daughter. Patient's daughter was not in the room. CSW attempted to contact her on her cell phone.  CSW will continue to follow. Per, RNCM family is requesting Doctor, hospital. Patient was admitted from a facility. CSW faxed patient's information to [facility].  Sabino Niemann, MSW, [employer] [phone]

## 2014-01-28 NOTE — Consult Note (Signed)
WOC wound follow-up consult note CCS team now following for assessment and plan of care. Please refer to this team for further plan of care and please re-consult if further assistance is needed.  Thank-you,  Cammie Mcgee MSN, RN, CWOCN, [place], CNS [phone]

## 2014-01-28 NOTE — Progress Notes (Signed)
[facility] PROGRESS NOTE  [name] TAV:[id number] DOB: 20-May-1941 DOA: 01/23/2014 PCP: Dwan Bolt, MD  Assessment/Plan: Chest pain  Denies further pain.[name]  Hx of significant CAD. troponins are negative. cardiology evaluated the pt and recommended not a candidate  cardiac cath and she is not a candidate for CABG. she has high bleeding risk on dual antiplatelet therapy following PCI.  Also has severe cardiomyopathy but is not a candidate for AICD Given co morbidities and poor long term prognosis..   Recommend optimal medical management. Increased dose of imdur. added hydralazine.  continue coreg. Patient has been allergy to aspirin. Started on plavix. -VQ scan with low probability for PE. -Chest pain has resolved.    Bilateral visual loss  Painless visual loss, has been going on for at least for the past 5 days. Seen by opthalmology Dr Posey Pronto and suggests findings of background proliferative retinopathy. opthalmology will consult retina specialist to evaluate. Her.CT head unremarkable. ESR and CRP mildly elevated. -needs tighter blood glucose control.  -Dr. Deloria Lair, a retina specialist. Has agreed to see [name] per Dr Posey Pronto note.  -Dr Zadie Rhine recommend out patient follow up in 2 to 3 weeks for further care.   UTI (lower urinary tract infection)  - urine cultures.( >100k colonies), Enterobacter. - Sensitive to ciprofloxacin.  -Continue with ciprofloxacin day 4. WBC at 11.   Sacral decubitus stage II-III Minimal purulent discharge. ESR mildly elevated. Appreciate wound care evaluation. On santyl dressing Patient S/P debridement by surgery 4-15.  No need for antibiotics.  Vancomycin discontinue 4-16.  Dressing changes BID.   Left little finger gangrene  - History of left hand steal syndrome.  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 No sign of infection.  Anemia  - Secondary to end-stage renal disease.  - Monitor hemoglobin and hematocrit, erythropoietin per nephrology.    ESRD on dialysis  Renal following. HD per schedule.. Continue hectoral and Fosrenol with meals. Patient now wants to continue with dialysis.   DM type 2, uncontrolled, with renal complications  - Continue Lantus. 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 Retina specialist to see. Needs tighter blood glucose control. A1C of 8.1 and has elevated fsg. continue SSI. Hypoglycemia.  lantus discontinue.   Multifocal atrial tachycardia  Currently stable on telemetry.  Chronic systolic heart failure  EF of 30-35% on recent echo. Appears compensated. Continue beta blocker, indoor and hydralazine.  Essential hypertension  - Stable, continue amlodipine, Coreg. Added hydralazine. Dose of Imdur increased. BP stable.  COPD (chronic obstructive pulmonary disease)  nebulized bronchodilators as needed  PVD (peripheral vascular disease)-status post recent right AKA  - Staples in place, no discharge erythema at the stump site.  -will Notify vascular for staples removal.  History of prior VTE  - Recently taken off anticoagulation.  - Review of past medical history shows a history of peroneal vein DVT, however a recent lower extremity Doppler was negative.  -Since VQ scan is negative will not repeat Doppler lower extremity    Code Status: DO NOT RESUSCITATE Family Communication: discussed with patient. Daughter 4-16.  Disposition Plan: to be determine. Patient need ramp at home. CIR evaluation.    Consultants:  Cardiology  Ophthalmology  Renal  Wound care  palliative care  Procedures:  None  Antibiotics: IV vancomycin  cipro   HPI/Subjective: Pain better now. Eating breakfast.  She will let her daughter decide regarding disposition.   Objective: Filed Vitals:   01/28/14 1006  BP:   Pulse: 68  Temp:  Resp:     Intake/Output Summary (Last 24 hours) at 01/28/14 1022 Last data filed at 01/28/14 0000  Gross per 24 hour  Intake      0 ml  Output   3067 ml   Net  -3067 ml   Filed Weights   01/25/14 0524 01/27/14 1945 01/28/14 0000  Weight: 58.3 kg (128 lb 8.5 oz) 57.8 kg (127 lb 6.8 oz) 56.3 kg (124 lb 1.9 oz)    Exam:   General:  Elderly female in NAD,   HEENT: no pallor, pupils reactive b/l, moist mucosa.   Chest: clear b/l  CVS: NS1&S2, no murmurs,   Abd: soft, NT ND BS+  Ext: warm, no edema, rt AKA with clean staples  CNS: AAOX3,   Data Reviewed: Basic Metabolic Panel:  Recent Labs Lab 01/23/14 1139 01/23/14 2300 01/24/14 0340 01/25/14 0851 01/26/14 0516 01/27/14 2015  NA 137  --  135* 137 137 135*  K 4.1  --  4.4 4.0 3.8 4.4  CL 92*  --  92* 93* 95* 92*  CO2 26  --  _0 GLUCOSE 208*  --  335* 89 77 95  BUN 21  --  29* 41* 48* 49*  CREATININE 3.73* 4.03* 4.30* 5.02* 5.30* 5.78*  CALCIUM 10.0  --  9.7 9.8 9.9 9.7  PHOS  --   --   --  2.2*  --  3.0   Liver Function Tests:  Recent Labs Lab 01/23/14 1139 01/25/14 0851 01/27/14 2015  AST 32  --   --   ALT 15  --   --   ALKPHOS 134*  --   --   BILITOT 0.4  --   --   PROT 7.1  --   --   ALBUMIN 2.2* 1.9* 1.9*   No results found for this basename: LIPASE, AMYLASE,  in the last 168 hours No results found for this basename: AMMONIA,  in the last 168 hours CBC:  Recent Labs Lab 01/23/14 1139 01/23/14 2300 01/24/14 0340 01/25/14 0851 01/27/14 2014 01/28/14 0500  WBC 10.7* 10.6* 9.4 8.8 12.5* 11.0*  NEUTROABS 8.8*  --   --   --   --   --   HGB 10.9* 10.2* 9.5* 9.4* 9.6* 9.4*  HCT 36.0 32.8* 30.6* 30.5* 30.7* 30.8*  MCV 87.4 85.4 85.5 84.3 83.4 84.8  PLT 430* 403* 377 370 372 311   Cardiac Enzymes:  Recent Labs Lab 01/23/14 1627 01/23/14 2300 01/24/14 0340 01/24/14 1000  TROPONINI <0.30 <0.30 <0.30 <0.30   BNP (last 3 results)  Recent Labs  08/17/13 1603 12/22/13 1809 01/23/14 1140  PROBNP 28521.0* >70000.0* >70000.0*   CBG:  Recent Labs Lab 01/27/14 0756 01/27/14 0816 01/27/14 1147 01/27/14 1932 01/28/14 0725   GLUCAP 42* 109* 83 84 84    Recent Results (from the past 240 hour(s))  URINE CULTURE     Status: None   Collection Time    01/23/14  1:34 PM      Result Value Ref Range Status   Specimen Description URINE, CLEAN CATCH   Final   Special Requests ADD [id number]   Final   Culture  Setup Time     Final   Value: 01/23/2014 17:34     Performed at [facility]     Final   Value: >=100,000 COLONIES/ML     Performed at [place]     Final  Value: ENTEROBACTER AEROGENES     Performed at [facility]   Report Status 01/25/2014 FINAL   Final   Organism ID, Bacteria ENTEROBACTER AEROGENES   Final  MRSA PCR SCREENING     Status: None   Collection Time    01/23/14  9:16 PM      Result Value Ref Range Status   MRSA by PCR NEGATIVE  NEGATIVE Final   Comment:            The GeneXpert MRSA Assay (FDA     approved for NASAL specimens     only), is one component of a     comprehensive MRSA colonization     surveillance program. It is not     intended to diagnose MRSA     infection nor to guide or     monitor treatment for     MRSA infections.     Studies: No results found.  Scheduled Meds: . amLODipine  10 mg Oral Daily  . atorvastatin  40 mg Oral Daily  . carvedilol  12.5 mg Oral BID WC  . ciprofloxacin  500 mg Oral Q24H  . clopidogrel  75 mg Oral Daily  . darbepoetin (ARANESP) injection - DIALYSIS  100 mcg Intravenous Q Wed-HD  . feeding supplement (NEPRO CARB STEADY)  237 mL Oral TID WC  . folic acid  1 mg Oral Daily  . heparin  1,800 Units Dialysis Once in dialysis  . heparin subcutaneous  5,000 Units Subcutaneous 3 times per day  . hydrALAZINE  25 mg Oral BID  . insulin aspart  0-9 Units Subcutaneous TID WC  . isosorbide mononitrate  30 mg Oral Daily  . lidocaine-EPINEPHrine  20 mL Intradermal Once  . mirtazapine  7.5 mg Oral QHS  . multivitamin  1 tablet Oral QHS  . omega-3 acid ethyl esters  1 g Oral QID  .  pantoprazole  40 mg Oral Daily  . sodium chloride  3 mL Intravenous Q12H  . vitamin C  500 mg Oral Daily  . zinc sulfate  220 mg Oral Daily   Continuous Infusions:     Time spent: [facility] Pager [phone] If 7PM-7AM, please contact night-coverage at [email or web address], password [facility] 01/28/2014, 10:22 AM  LOS: 5 days

## 2014-01-28 NOTE — Evaluation (Signed)
Physical Therapy Evaluation Patient Details Name: Rhonda CarrowMary P XXXBynum MRN: [id number] DOB: 02/28/1941 Today's Date: 01/28/2014   History of Present Illness  73 [facility]. [facility] with [facility].  Triponins are (-), per cardiology she is not a candidate for cardiac cath.  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    Clinical Impression  Pt is moving better than anticipated given her complex medical hx.  She seems to be mildly confused and does have visual deficits, yet can see some things when handed to her (see OT note).  She is mod assist to stand and likely the same to transfer stand or squat pivot (she did not want to get up currently as she has been sitting EOB all AM).  She has a significant wound on her bottom and would likely not be a good idea to scoot into a WC at this time due to the shearing forces on the bottom.  She would benefit from some intensive inpatient rehab for both her strength and mobility as well as treatment of her wound before going home.   PT to follow acutely for deficits listed below.       Follow Up Recommendations CIR    Equipment Recommendations  None recommended by PT    Recommendations for Other Services   None    Precautions / Restrictions Precautions Precautions: Fall      Mobility   Transfers Overall transfer level: Needs assistance Equipment used: None Transfers: Sit to/from Stand Sit to Stand: Mod assist         General transfer comment: Mod assist to stand from bed x2, pt instructing PT on blocking her left knee during stand.  Pt pushing up over her left leg well knee blocked for safety, pt able to extend hips to almost fully upright standing and stood ~45 seconds both times.  She uses bil upper extremity to hold to therapist for balance and support while standing.           Balance Overall balance assessment: Needs assistance Sitting-balance support: Feet supported;No upper extremity supported (left foot supported) Sitting balance-Leahy Scale: Good     Standing balance support: Bilateral upper extremity supported Standing balance-Leahy Scale: Poor Standing balance comment: needs support to stand.                              Pertinent Vitals/Pain HR in the 60s, O2 sats 96% despite DOE 2/4 during some mobility.     Home Living Family/patient expects to be discharged to:: Private residence Living Arrangements: Children (daughter) Available Help at Discharge: Family                   Extremity/Trunk Assessment   Upper Extremity Assessment: Defer to OT evaluation           Lower Extremity Assessment: RLE deficits/detail;LLE deficits/detail RLE Deficits / Details: s/p AKA on 01/12/14, still has staples.   LLE Deficits / Details: Functional strength left leg 4/5  Cervical / Trunk Assessment: Kyphotic;Other exceptions  Communication   Communication: No difficulties  Cognition Arousal/Alertness: Awake/alert Behavior During Therapy: WFL for tasks assessed/performed Overall Cognitive Status: No family/caregiver present to determine baseline cognitive functioning  General Comments General comments (skin integrity, edema, etc.): PT with some DOE during activity 2/4 O2 sats and HR stable throughout.           Assessment/Plan    PT Assessment Patient needs continued PT services  PT Diagnosis Difficulty walking;Abnormality of gait;Generalized weakness;Acute pain   PT Problem List Decreased strength;Decreased activity tolerance;Decreased balance;Decreased mobility;Decreased knowledge of use of DME;Decreased safety awareness;Cardiopulmonary status limiting activity;Pain  PT Treatment Interventions DME instruction;Gait training;Stair training;Functional mobility training;Therapeutic  activities;Therapeutic exercise;Balance training;Neuromuscular re-education;Cognitive remediation;Patient/family education;Wheelchair mobility training;Modalities   PT Goals (Current goals can be found in the Care Plan section) Acute Rehab PT Goals Patient Stated Goal: to get stronger, decrease pain in buttocks PT Goal Formulation: With patient Time For Goal Achievement: 02/11/14 Potential to Achieve Goals: Good    Frequency Min 3X/week   Barriers to discharge   Pt with significant wounds on her buttocks/sacrum.  Unsure of her home assistance (?24/7).  Pt would benefit from intensive rehab and continued consistant wound care before d/c home.      Co-evaluation PT/OT/SLP Co-Evaluation/Treatment: Yes Reason for Co-Treatment: Complexity of the patient's impairments (multi-system involvement) PT goals addressed during session: Mobility/safety with mobility;Balance;Proper use of DME;Strengthening/ROM OT goals addressed during session: ADL's and self-care;Strengthening/ROM       End of Session Equipment Utilized During Treatment: Gait belt Activity Tolerance: Patient limited by fatigue;Patient limited by pain Patient left: in bed;with call bell/phone within reach;Other (comment) (working with OT seated EOB. )           Time: 8325-4982 PT Time Calculation (min): 16 min   Charges:   PT Evaluation $Initial PT Evaluation Tier I: 1 Procedure          Jaslyn Bansal B. Auryn Paige, PT, DPT [phone]   01/28/2014, 10:42 AM

## 2014-01-29 DIAGNOSIS — J449 Chronic obstructive pulmonary disease, unspecified: Secondary | ICD-10-CM

## 2014-01-29 LAB — GLUCOSE, CAPILLARY
Glucose-Capillary: 99 mg/dL (ref 70–99)
Glucose-Capillary: 139 mg/dL — ABNORMAL HIGH (ref 70–99)
Glucose-Capillary: 143 mg/dL — ABNORMAL HIGH (ref 70–99)

## 2014-01-29 MED ORDER — OXYCODONE HCL 5 MG PO TABS
ORAL_TABLET | ORAL | Status: AC
  Filled 2014-01-29: qty 2

## 2014-01-29 NOTE — Progress Notes (Signed)
S: Much less pain this AM O:BP 128/77  Pulse 54  Temp(Src) 99 F (37.2 C) (Oral)  Resp 18  Ht 5\' 3"  (1.6 m)  Wt 53.655 kg (118 lb 4.6 oz)  BMI 20.96 kg/m2  SpO2 100%  Intake/Output Summary (Last 24 hours) at 01/29/14 0739 Last data filed at 01/28/14 0900  Gross per 24 hour  Intake    240 ml  Output      0 ml  Net    240 ml   Weight change: -4.145 kg (-9 lb 2.2 oz) NUU:VOZDG and alert CVS:RRR Resp:clear Abd:+ BS NTND Ext:Rt AKA, no edema, Dry gangrene of lt 5th digit NEURO:CNI Ox3 No asterixis Rt IJ permcath   . amLODipine  10 mg Oral Daily  . atorvastatin  40 mg Oral Daily  . carvedilol  12.5 mg Oral BID WC  . ciprofloxacin  500 mg Oral Q24H  . clopidogrel  75 mg Oral Daily  . darbepoetin (ARANESP) injection - DIALYSIS  100 mcg Intravenous Q Wed-HD  . feeding supplement (NEPRO CARB STEADY)  237 mL Oral TID WC  . folic acid  1 mg Oral Daily  . heparin  1,800 Units Dialysis Once in dialysis  . heparin subcutaneous  5,000 Units Subcutaneous 3 times per day  . hydrALAZINE  25 mg Oral BID  . insulin aspart  0-9 Units Subcutaneous TID WC  . isosorbide mononitrate  30 mg Oral Daily  . lidocaine-EPINEPHrine  20 mL Intradermal Once  . mirtazapine  7.5 mg Oral QHS  . multivitamin  1 tablet Oral QHS  . omega-3 acid ethyl esters  1 g Oral QID  . pantoprazole  40 mg Oral Daily  . sodium chloride  3 mL Intravenous Q12H  . vitamin C  500 mg Oral Daily  . zinc sulfate  220 mg Oral Daily   No results found. BMET    Component Value Date/Time   NA 135* 01/27/2014 2015   K 4.4 01/27/2014 2015   CL 92* 01/27/2014 2015   CO2 26 01/27/2014 2015   GLUCOSE 95 01/27/2014 2015   BUN 49* 01/27/2014 2015   CREATININE 5.78* 01/27/2014 2015   CALCIUM 9.7 01/27/2014 2015   CALCIUM 8.7 07/01/2011 1831   GFRNONAA 7* 01/27/2014 2015   GFRAA 8* 01/27/2014 2015   CBC    Component Value Date/Time   WBC 11.0* 01/28/2014 0500   RBC 3.63* 01/28/2014 0500   HGB 9.4* 01/28/2014 0500   HCT 30.8*  01/28/2014 0500   PLT 311 01/28/2014 0500   MCV 84.8 01/28/2014 0500   MCH 25.9* 01/28/2014 0500   MCHC 30.5 01/28/2014 0500   RDW 17.8* 01/28/2014 0500   LYMPHSABS 1.1 01/23/2014 1139   MONOABS 0.6 01/23/2014 1139   EOSABS 0.2 01/23/2014 1139   BASOSABS 0.0 01/23/2014 1139     Assessment: 1. ESRD 2. Anemia on aranesp 3. Sec HPTH on hectorol 4. SP Rt AKA 5. HTN 6. Enterobacter UTI on cipro Plan: 1. HD today 2. Awaiting SNF [facility]

## 2014-01-29 NOTE — Progress Notes (Signed)
[facility] PROGRESS NOTE  [name] XMI:[id number] DOB: June 21, 1941 DOA: 01/23/2014 PCP: Dwan Bolt, MD  Assessment/Plan: Chest pain  Denies further pain.[name]  Hx of significant CAD. troponins are negative. cardiology evaluated the pt and recommended not a candidate  cardiac cath and she is not a candidate for CABG. she has high bleeding risk on dual antiplatelet therapy following PCI.  Also has severe cardiomyopathy but is not a candidate for AICD Given co morbidities and poor long term prognosis..   Recommend optimal medical management. Increased dose of imdur. added hydralazine.  continue coreg. Patient has been allergy to aspirin. Started on plavix. -VQ scan with low probability for PE. -Chest pain has resolved.    Bilateral visual loss  Painless visual loss, has been going on for at least for the past 5 days. Seen by opthalmology Dr Posey Pronto and suggests findings of background proliferative retinopathy. opthalmology will consult retina specialist to evaluate. Her.CT head unremarkable. ESR and CRP mildly elevated. -needs tighter blood glucose control.  -Dr. Deloria Lair, a retina specialist. Has agreed to see [name] per Dr Posey Pronto note.  -Dr Zadie Rhine recommend out patient follow up in 2 to 3 weeks for further care.   UTI (lower urinary tract infection)  - urine cultures.( >100k colonies), Enterobacter. - Sensitive to ciprofloxacin.  -Continue with ciprofloxacin day 5. WBC at 11.   Sacral decubitus stage II-III Minimal purulent discharge. ESR mildly elevated. Appreciate wound care evaluation. On santyl dressing Patient S/P debridement by surgery 4-15.  No need for antibiotics.  Vancomycin discontinue 4-16.  Dressing changes BID.  Pain better today.   Left little finger gangrene  - History of left hand steal syndrome.  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 No sign of infection.  Anemia  - Secondary to end-stage renal disease.  - Monitor hemoglobin and hematocrit,  erythropoietin per nephrology.   ESRD on dialysis  Renal following. HD per schedule.. Continue hectoral and Fosrenol with meals. Patient now wants to continue with dialysis.   DM type 2, uncontrolled, with renal complications  - Continue Lantus. 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 Retina specialist to see. Needs tighter blood glucose control. A1C of 8.1 and has elevated fsg. continue SSI. Hypoglycemia.  lantus discontinue.   Multifocal atrial tachycardia  Currently stable on telemetry.  Chronic systolic heart failure  EF of 30-35% on recent echo. Appears compensated. Continue beta blocker, indoor and hydralazine.  Essential hypertension  - Stable, continue amlodipine, Coreg. Added hydralazine. Dose of Imdur increased. BP stable.  COPD (chronic obstructive pulmonary disease)  nebulized bronchodilators as needed  PVD (peripheral vascular disease)-status post recent right AKA  - Staples in place, no discharge erythema at the stump site.  -will Notify vascular for staples removal.  History of prior VTE  - Recently taken off anticoagulation.  - Review of past medical history shows a history of peroneal vein DVT, however a recent lower extremity Doppler was negative.  -Since VQ scan is negative will not repeat Doppler lower extremity    Code Status: DO NOT RESUSCITATE Family Communication: discussed with patient. Daughter 56-17.  Disposition Plan: SNF.    Consultants:  Cardiology  Ophthalmology  Renal  Wound care  palliative care  Procedures:  None  Antibiotics: IV vancomycin  cipro   HPI/Subjective: Seen during hemodialysis. Pain better.   Objective: Filed Vitals:   01/29/14 1359  BP: 124/59  Pulse: 76  Temp:   Resp:     Intake/Output Summary (Last 24 hours) at 01/29/14 1434 Last  data filed at 01/29/14 1345  Gross per 24 hour  Intake    100 ml  Output      0 ml  Net    100 ml   Filed Weights   01/27/14 1945 01/28/14 0000 01/29/14  0434  Weight: 57.8 kg (127 lb 6.8 oz) 56.3 kg (124 lb 1.9 oz) 53.655 kg (118 lb 4.6 oz)    Exam:   General:  Elderly female in NAD,   HEENT: no pallor, pupils reactive b/l, moist mucosa.   Chest: clear b/l  CVS: NS1&S2, no murmurs,   Abd: soft, NT ND BS+  Ext: warm, no edema, rt AKA with clean staples  CNS: AAOX3,   Data Reviewed: Basic Metabolic Panel:  Recent Labs Lab 01/23/14 1139 01/23/14 2300 01/24/14 0340 01/25/14 0851 01/26/14 0516 01/27/14 2015  NA 137  --  135* 137 137 135*  K 4.1  --  4.4 4.0 3.8 4.4  CL 92*  --  92* 93* 95* 92*  CO2 26  --  _0 GLUCOSE 208*  --  335* 89 77 95  BUN 21  --  29* 41* 48* 49*  CREATININE 3.73* 4.03* 4.30* 5.02* 5.30* 5.78*  CALCIUM 10.0  --  9.7 9.8 9.9 9.7  PHOS  --   --   --  2.2*  --  3.0   Liver Function Tests:  Recent Labs Lab 01/23/14 1139 01/25/14 0851 01/27/14 2015  AST 32  --   --   ALT 15  --   --   ALKPHOS 134*  --   --   BILITOT 0.4  --   --   PROT 7.1  --   --   ALBUMIN 2.2* 1.9* 1.9*   No results found for this basename: LIPASE, AMYLASE,  in the last 168 hours No results found for this basename: AMMONIA,  in the last 168 hours CBC:  Recent Labs Lab 01/23/14 1139 01/23/14 2300 01/24/14 0340 01/25/14 0851 01/27/14 2014 01/28/14 0500  WBC 10.7* 10.6* 9.4 8.8 12.5* 11.0*  NEUTROABS 8.8*  --   --   --   --   --   HGB 10.9* 10.2* 9.5* 9.4* 9.6* 9.4*  HCT 36.0 32.8* 30.6* 30.5* 30.7* 30.8*  MCV 87.4 85.4 85.5 84.3 83.4 84.8  PLT 430* 403* 377 370 372 311   Cardiac Enzymes:  Recent Labs Lab 01/23/14 1627 01/23/14 2300 01/24/14 0340 01/24/14 1000  TROPONINI <0.30 <0.30 <0.30 <0.30   BNP (last 3 results)  Recent Labs  08/17/13 1603 12/22/13 1809 01/23/14 1140  PROBNP 28521.0* >70000.0* >70000.0*   CBG:  Recent Labs Lab 01/28/14 1119 01/28/14 1635 01/28/14 2016 01/29/14 0713 01/29/14 1127  GLUCAP 166* 103* 130* 99 139*    Recent Results (from the past 240  hour(s))  URINE CULTURE     Status: None   Collection Time    01/23/14  1:34 PM      Result Value Ref Range Status   Specimen Description URINE, CLEAN CATCH   Final   Special Requests ADD [id number]   Final   Culture  Setup Time     Final   Value: 01/23/2014 17:34     Performed at [facility]     Final   Value: >=100,000 COLONIES/ML     Performed at [facility]   Culture     Final   Value: ENTEROBACTER AEROGENES     Performed at [facility]   Report Status 01/25/2014 FINAL   Final   Organism ID, Bacteria ENTEROBACTER AEROGENES   Final  MRSA PCR SCREENING     Status: None   Collection Time    01/23/14  9:16 PM      Result Value Ref Range Status   MRSA by PCR NEGATIVE  NEGATIVE Final   Comment:            The GeneXpert MRSA Assay (FDA     approved for NASAL specimens     only), is one component of a     comprehensive MRSA colonization     surveillance program. It is not     intended to diagnose MRSA     infection nor to guide or     monitor treatment for     MRSA infections.     Studies: No results found.  Scheduled Meds: . amLODipine  10 mg Oral Daily  . atorvastatin  40 mg Oral Daily  . carvedilol  12.5 mg Oral BID WC  . ciprofloxacin  500 mg Oral Q24H  . clopidogrel  75 mg Oral Daily  . darbepoetin (ARANESP) injection - DIALYSIS  100 mcg Intravenous Q Wed-HD  . feeding supplement (NEPRO CARB STEADY)  237 mL Oral TID WC  . folic acid  1 mg Oral Daily  . heparin  1,800 Units Dialysis Once in dialysis  . heparin subcutaneous  5,000 Units Subcutaneous 3 times per day  . hydrALAZINE  25 mg Oral BID  . insulin aspart  0-9 Units Subcutaneous TID WC  . isosorbide mononitrate  30 mg Oral Daily  . lidocaine-EPINEPHrine  20 mL Intradermal Once  . mirtazapine  7.5 mg Oral QHS  . multivitamin  1 tablet Oral QHS  . omega-3 acid ethyl esters  1 g Oral QID  . pantoprazole  40 mg Oral Daily  . sodium chloride  3 mL Intravenous Q12H   . vitamin C  500 mg Oral Daily  . zinc sulfate  220 mg Oral Daily   Continuous Infusions:     Time spent: [facility] Pager [phone] If 7PM-7AM, please contact night-coverage at [email or web address], password [facility] 01/29/2014, 2:34 PM  LOS: 6 days

## 2014-01-30 LAB — GLUCOSE, CAPILLARY
GLUCOSE-CAPILLARY: 86 mg/dL (ref 70–99)
GLUCOSE-CAPILLARY: 152 mg/dL — AB (ref 70–99)
GLUCOSE-CAPILLARY: 172 mg/dL — AB (ref 70–99)
Glucose-Capillary: 150 mg/dL — ABNORMAL HIGH (ref 70–99)
Glucose-Capillary: 206 mg/dL — ABNORMAL HIGH (ref 70–99)

## 2014-01-30 NOTE — Clinical Social Work Note (Signed)
CSW continues to follow for d/c planning needs. CSW met with patient's daughter. Per daughter she has requested a ramp and wheelchair in order for patient to d/c to her home with home health. Patient's daughter stated she has already cleaned out patient's apartment and transformed the living room in her home into a room for patient. Patient's daughter stated she will accept SNF as a back up if she is unable to get the ramp and wheelchair prior to patient d/c.Patient's daughter stated patient can no longer walk and she cannot continue to lift patient. Patient's daughter is agreeable to patient returning to [facility]. Patient also continues to be agreeable to returning to [facility]. RNCM made aware of daughter's request for medical equipment. CSW to continue to follow for d/c planning needs.   [place], [place] [facility] Clinical Social Worker [phone]

## 2014-01-30 NOTE — Clinical Social Work Psychosocial (Signed)
Clinical Social Work Department BRIEF PSYCHOSOCIAL ASSESSMENT 01/30/2014  Patient:  [name], [name]     Account Number:  [id number]     Admit date:  01/23/2014  Clinical Social Worker:  Hubert Azure  Date/Time:  01/30/2014 05:20 PM  Referred by:  Physician  Date Referred:  01/30/2014 Referred for  SNF Placement   Other Referral:   Interview type:  Patient Other interview type:   CSW attempted to meet with patient, however patient was in pain, and provided consent for CSW to speak with her daughter [name].    PSYCHOSOCIAL DATA Living Status:  FACILITY Admitted from facility:   Level of care:  [place] Primary support name:  [name] Primary support relationship to patient:  CHILD, ADULT Degree of support available:   Good. Mrs. [name] is patient's POA and is very active in d/c planning process.    CURRENT CONCERNS Current Concerns  Post-Acute Placement   Other Concerns:   Medical Equipment    SOCIAL WORK ASSESSMENT / PLAN CSW met with patient who was in pain and preparing to go to dialysis. CSW introduced self and explained role. CSW discussed d/c plan with patient. Patient is agreeable with return to [facility], with the goal of ultimately going to live with daughter [name]. Patient provided consent to contact daughter. CSW contacted patient's daughter who reported being frustrated with requesting a ramp to be installed at her home and a wheelchair. Patient's daughter stated she cannot get patient into the home in a wheelchair without a ramp, and she cannot continue to pick patient up and transport her without assistance. Patient's daughter stated first preference is for patient to come home to live with her if she can get the ramp and wheelchair prior to discharge, however, she is agreeable to SNF with [facility], if she is unable to get the equipment in time.   Assessment/plan status:   Information/Referral to [facility] Other assessment/ plan:   Will update FL2 for SNF return.   Information/referral to community resources:    PATIENT'S/FAMILY'S RESPONSE TO PLAN OF CARE: Patient's daughter thanked CSW for assistance with d/c plan and informing RNCM of patient's medical equipment needs.    [place], [place] [facility] Clinical Social Worker [phone]

## 2014-01-30 NOTE — Progress Notes (Signed)
S: I hurt all over O:BP 110/44  Pulse 84  Temp(Src) 98.1 F (36.7 C) (Oral)  Resp 18  Ht 5\' 3"  (1.6 m)  Wt 55 kg (121 lb 4.1 oz)  BMI 21.48 kg/m2  SpO2 96%  Intake/Output Summary (Last 24 hours) at 01/30/14 0747 Last data filed at 01/29/14 1754  Gross per 24 hour  Intake    100 ml  Output   2420 ml  Net  -2320 ml   Weight change: 3.745 kg (8 lb 4.1 oz) BXI:DHWYS and alert CVS:RRR Resp:clear Abd:+ BS NTND Ext:Rt AKA, no edema, Dry gangrene of lt 5th digit NEURO:CNI Ox3 No asterixis Rt IJ permcath   . amLODipine  10 mg Oral Daily  . atorvastatin  40 mg Oral Daily  . carvedilol  12.5 mg Oral BID WC  . ciprofloxacin  500 mg Oral Q24H  . clopidogrel  75 mg Oral Daily  . darbepoetin (ARANESP) injection - DIALYSIS  100 mcg Intravenous Q Wed-HD  . feeding supplement (NEPRO CARB STEADY)  237 mL Oral TID WC  . folic acid  1 mg Oral Daily  . heparin subcutaneous  5,000 Units Subcutaneous 3 times per day  . hydrALAZINE  25 mg Oral BID  . insulin aspart  0-9 Units Subcutaneous TID WC  . isosorbide mononitrate  30 mg Oral Daily  . lidocaine-EPINEPHrine  20 mL Intradermal Once  . mirtazapine  7.5 mg Oral QHS  . multivitamin  1 tablet Oral QHS  . omega-3 acid ethyl esters  1 g Oral QID  . pantoprazole  40 mg Oral Daily  . sodium chloride  3 mL Intravenous Q12H  . vitamin C  500 mg Oral Daily  . zinc sulfate  220 mg Oral Daily   No results found. BMET    Component Value Date/Time   NA 135* 01/27/2014 2015   K 4.4 01/27/2014 2015   CL 92* 01/27/2014 2015   CO2 26 01/27/2014 2015   GLUCOSE 95 01/27/2014 2015   BUN 49* 01/27/2014 2015   CREATININE 5.78* 01/27/2014 2015   CALCIUM 9.7 01/27/2014 2015   CALCIUM 8.7 07/01/2011 1831   GFRNONAA 7* 01/27/2014 2015   GFRAA 8* 01/27/2014 2015   CBC    Component Value Date/Time   WBC 11.0* 01/28/2014 0500   RBC 3.63* 01/28/2014 0500   HGB 9.4* 01/28/2014 0500   HCT 30.8* 01/28/2014 0500   PLT 311 01/28/2014 0500   MCV 84.8 01/28/2014 0500    MCH 25.9* 01/28/2014 0500   MCHC 30.5 01/28/2014 0500   RDW 17.8* 01/28/2014 0500   LYMPHSABS 1.1 01/23/2014 1139   MONOABS 0.6 01/23/2014 1139   EOSABS 0.2 01/23/2014 1139   BASOSABS 0.0 01/23/2014 1139     Assessment: 1. ESRD 2. Anemia on aranesp 3. Sec HPTH on hectorol 4. SP Rt AKA 5. HTN 6. Enterobacter UTI on cipro Plan: 1.  HD tomorrow to get back on MWF schedule.  Will check labs at HD [name]

## 2014-01-30 NOTE — Progress Notes (Signed)
[facility] PROGRESS NOTE  [name] HAL:[id number] DOB: 10-08-1941 DOA: 01/23/2014 PCP: Dwan Bolt, MD  Assessment/Plan: Chest pain  Denies further pain.[name]  Hx of significant CAD. troponins are negative. cardiology evaluated the pt and recommended not a candidate  cardiac cath and she is not a candidate for CABG. she has high bleeding risk on dual antiplatelet therapy following PCI.  Also has severe cardiomyopathy but is not a candidate for AICD Given co morbidities and poor long term prognosis..   Recommend optimal medical management. Increased dose of imdur. added hydralazine.  continue coreg. Patient has been allergy to aspirin. Started on plavix. -VQ scan with low probability for PE. -Chest pain has resolved.    Bilateral visual loss  Painless visual loss, has been going on for at least for the past 5 days. Seen by opthalmology Dr Posey Pronto and suggests findings of background proliferative retinopathy. opthalmology will consult retina specialist to evaluate. Her.CT head unremarkable. ESR and CRP mildly elevated. -needs tighter blood glucose control.  -Dr. Deloria Lair, a retina specialist. Has agreed to see [name] per Dr Posey Pronto note.  -Dr Zadie Rhine recommend out patient follow up in 2 to 3 weeks for further care.   UTI (lower urinary tract infection)  - urine cultures.( >100k colonies), Enterobacter. - Sensitive to ciprofloxacin.  -Continue with ciprofloxacin day 6. WBC at 11.  -will discontinue cipro.   Sacral decubitus stage II-III Minimal purulent discharge. ESR mildly elevated. Appreciate wound care evaluation. On santyl dressing Patient S/P debridement by surgery 4-15.  No need for antibiotics.  Vancomycin discontinue 4-16.  Dressing changes BID.  Pain better today.   Left little finger gangrene  - History of left hand steal syndrome.  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 No sign of infection.  Anemia  - Secondary to end-stage renal disease.  - Monitor hemoglobin  and hematocrit, erythropoietin per nephrology.   ESRD on dialysis  Renal following. HD per schedule.. Continue hectoral and Fosrenol with meals. Patient now wants to continue with dialysis.   DM type 2, uncontrolled, with renal complications  - Continue Lantus. 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 Retina specialist to see. Needs tighter blood glucose control. A1C of 8.1 and has elevated fsg. continue SSI. Hypoglycemia.  lantus discontinue.   Multifocal atrial tachycardia  Currently stable on telemetry.  Chronic systolic heart failure  EF of 30-35% on recent echo. Appears compensated. Continue beta blocker, indoor and hydralazine.  Essential hypertension  - Stable, continue amlodipine, Coreg. Added hydralazine. Dose of Imdur increased. BP stable.  COPD (chronic obstructive pulmonary disease)  nebulized bronchodilators as needed  PVD (peripheral vascular disease)-status post recent right AKA  - Staples in place, no discharge erythema at the stump site.  -will Notify vascular for staples removal.  History of prior VTE  - Recently taken off anticoagulation.  - Review of past medical history shows a history of peroneal vein DVT, however a recent lower extremity Doppler was negative.  -Since VQ scan is negative will not repeat Doppler lower extremity    Code Status: DO NOT RESUSCITATE Family Communication: discussed with patient. Daughter 20-17.  Disposition Plan: waiting SNF.    Consultants:  Cardiology  Ophthalmology  Renal  Wound care  palliative care  Procedures:  None  Antibiotics: IV vancomycin  cipro   HPI/Subjective: Pain on and off. Pain medications helping.   Objective: Filed Vitals:   01/30/14 1007  BP: 128/62  Pulse: 82  Temp:   Resp:     Intake/Output Summary (  Last 24 hours) at 01/30/14 1236 Last data filed at 01/30/14 1018  Gross per 24 hour  Intake    490 ml  Output   2420 ml  Net  -1930 ml   Filed Weights   01/29/14  0434 01/29/14 1342 01/29/14 1754  Weight: 53.655 kg (118 lb 4.6 oz) 57.4 kg (126 lb 8.7 oz) 55 kg (121 lb 4.1 oz)    Exam:   General:  Elderly female in NAD,   HEENT: no pallor, pupils reactive b/l, moist mucosa.   Chest: clear b/l  CVS: NS1&S2, no murmurs,   Abd: soft, NT ND BS+  Ext: warm, no edema, rt AKA with clean staples  CNS: AAOX3,   Data Reviewed: Basic Metabolic Panel:  Recent Labs Lab 01/23/14 2300 01/24/14 0340 01/25/14 0851 01/26/14 0516 01/27/14 2015  NA  --  135* 137 137 135*  K  --  4.4 4.0 3.8 4.4  CL  --  92* 93* 95* 92*  CO2  --  _0 GLUCOSE  --  335* 89 77 95  BUN  --  29* 41* 48* 49*  CREATININE 4.03* 4.30* 5.02* 5.30* 5.78*  CALCIUM  --  9.7 9.8 9.9 9.7  PHOS  --   --  2.2*  --  3.0   Liver Function Tests:  Recent Labs Lab 01/25/14 0851 01/27/14 2015  ALBUMIN 1.9* 1.9*   No results found for this basename: LIPASE, AMYLASE,  in the last 168 hours No results found for this basename: AMMONIA,  in the last 168 hours CBC:  Recent Labs Lab 01/23/14 2300 01/24/14 0340 01/25/14 0851 01/27/14 2014 01/28/14 0500  WBC 10.6* 9.4 8.8 12.5* 11.0*  HGB 10.2* 9.5* 9.4* 9.6* 9.4*  HCT 32.8* 30.6* 30.5* 30.7* 30.8*  MCV 85.4 85.5 84.3 83.4 84.8  PLT 403* 377 370 372 311   Cardiac Enzymes:  Recent Labs Lab 01/23/14 1627 01/23/14 2300 01/24/14 0340 01/24/14 1000  TROPONINI <0.30 <0.30 <0.30 <0.30   BNP (last 3 results)  Recent Labs  08/17/13 1603 12/22/13 1809 01/23/14 1140  PROBNP 28521.0* >70000.0* >70000.0*   CBG:  Recent Labs Lab 01/29/14 0713 01/29/14 1127 01/29/14 2110 01/30/14 0719 01/30/14 1126  GLUCAP 99 139* 143* 86 150*    Recent Results (from the past 240 hour(s))  URINE CULTURE     Status: None   Collection Time    01/23/14  1:34 PM      Result Value Ref Range Status   Specimen Description URINE, CLEAN CATCH   Final   Special Requests ADD [id number]   Final   Culture  Setup Time     Final    Value: 01/23/2014 17:34     Performed at [facility]     Final   Value: >=100,000 COLONIES/ML     Performed at [facility]   Culture     Final   Value: ENTEROBACTER AEROGENES     Performed at [facility]   Report Status 01/25/2014 FINAL   Final   Organism ID, Bacteria ENTEROBACTER AEROGENES   Final  MRSA PCR SCREENING     Status: None   Collection Time    01/23/14  9:16 PM      Result Value Ref Range Status   MRSA by PCR NEGATIVE  NEGATIVE Final   Comment:            The GeneXpert MRSA Assay (FDA  approved for NASAL specimens     only), is one component of a     comprehensive MRSA colonization     surveillance program. It is not     intended to diagnose MRSA     infection nor to guide or     monitor treatment for     MRSA infections.     Studies: No results found.  Scheduled Meds: . amLODipine  10 mg Oral Daily  . atorvastatin  40 mg Oral Daily  . carvedilol  12.5 mg Oral BID WC  . ciprofloxacin  500 mg Oral Q24H  . clopidogrel  75 mg Oral Daily  . darbepoetin (ARANESP) injection - DIALYSIS  100 mcg Intravenous Q Wed-HD  . feeding supplement (NEPRO CARB STEADY)  237 mL Oral TID WC  . folic acid  1 mg Oral Daily  . heparin subcutaneous  5,000 Units Subcutaneous 3 times per day  . hydrALAZINE  25 mg Oral BID  . insulin aspart  0-9 Units Subcutaneous TID WC  . isosorbide mononitrate  30 mg Oral Daily  . lidocaine-EPINEPHrine  20 mL Intradermal Once  . mirtazapine  7.5 mg Oral QHS  . multivitamin  1 tablet Oral QHS  . omega-3 acid ethyl esters  1 g Oral QID  . pantoprazole  40 mg Oral Daily  . sodium chloride  3 mL Intravenous Q12H  . vitamin C  500 mg Oral Daily  . zinc sulfate  220 mg Oral Daily   Continuous Infusions:     Time spent: [facility] Pager [phone] If 7PM-7AM, please contact night-coverage at [email or web address], password [facility] 01/30/2014, 12:36 PM  LOS: 7 days

## 2014-01-31 DIAGNOSIS — E43 Unspecified severe protein-calorie malnutrition: Secondary | ICD-10-CM | POA: Insufficient documentation

## 2014-01-31 LAB — CBC
HCT: 29.8 % — ABNORMAL LOW (ref 36.0–46.0)
Hemoglobin: 9.1 g/dL — ABNORMAL LOW (ref 12.0–15.0)
MCH: 25.5 pg — AB (ref 26.0–34.0)
MCHC: 30.5 g/dL (ref 30.0–36.0)
MCV: 83.5 fL (ref 78.0–100.0)
Platelets: 327 10*3/uL (ref 150–400)
RBC: 3.57 MIL/uL — AB (ref 3.87–5.11)
RDW: 17.1 % — ABNORMAL HIGH (ref 11.5–15.5)
WBC: 13.2 10*3/uL — ABNORMAL HIGH (ref 4.0–10.5)

## 2014-01-31 LAB — RENAL FUNCTION PANEL
Albumin: 1.9 g/dL — ABNORMAL LOW (ref 3.5–5.2)
BUN: 21 mg/dL (ref 6–23)
CALCIUM: 9.3 mg/dL (ref 8.4–10.5)
CO2: 29 meq/L (ref 19–32)
Chloride: 94 mEq/L — ABNORMAL LOW (ref 96–112)
Creatinine, Ser: 3.94 mg/dL — ABNORMAL HIGH (ref 0.50–1.10)
GFR calc non Af Amer: 10 mL/min — ABNORMAL LOW (ref >90)
GFR, EST AFRICAN AMERICAN: 12 mL/min — AB (ref >90)
Glucose, Bld: 155 mg/dL — ABNORMAL HIGH (ref 70–99)
PHOSPHORUS: 2.9 mg/dL (ref 2.3–4.6)
Potassium: 3.4 mEq/L — ABNORMAL LOW (ref 3.7–5.3)
SODIUM: 136 meq/L — AB (ref 137–147)

## 2014-01-31 LAB — GLUCOSE, CAPILLARY
Glucose-Capillary: 120 mg/dL — ABNORMAL HIGH (ref 70–99)
Glucose-Capillary: 261 mg/dL — ABNORMAL HIGH (ref 70–99)
Glucose-Capillary: 167 mg/dL — ABNORMAL HIGH (ref 70–99)

## 2014-01-31 LAB — HEPATITIS B SURFACE ANTIGEN: Hepatitis B Surface Ag: NEGATIVE

## 2014-01-31 MED ORDER — COLLAGENASE 250 UNIT/GM EX OINT
TOPICAL_OINTMENT | Freq: Every day | CUTANEOUS | Status: DC
  Administered 2014-01-31 – 2014-02-02 (×3): via TOPICAL
  Filled 2014-01-31: qty 30

## 2014-01-31 MED ORDER — CIPROFLOXACIN HCL 250 MG PO TABS
250.0000 mg | ORAL_TABLET | Freq: Two times a day (BID) | ORAL | Status: DC
  Administered 2014-01-31 – 2014-02-02 (×4): 250 mg via ORAL
  Filled 2014-01-31 (×7): qty 1

## 2014-01-31 MED ORDER — OXYCODONE HCL 5 MG PO TABS
ORAL_TABLET | ORAL | Status: AC
  Administered 2014-01-31: 10 mg
  Filled 2014-01-31: qty 2

## 2014-01-31 NOTE — Progress Notes (Signed)
[facility] PROGRESS NOTE  CHARLEA NARDO HQP:[id number] DOB: February 10, 1941 DOA: 01/23/2014 PCP: Dwan Bolt, MD  Assessment/Plan: Chest pain  Denies further pain.[name]  Hx of significant CAD. troponins are negative. cardiology evaluated the pt and recommended not a candidate  cardiac cath and she is not a candidate for CABG. she has high bleeding risk on dual antiplatelet therapy following PCI.  Also has severe cardiomyopathy but is not a candidate for AICD Given co morbidities and poor long term prognosis..   Recommend optimal medical management. Increased dose of imdur. added hydralazine.  continue coreg. Patient has been allergy to aspirin. Started on plavix. -VQ scan with low probability for PE. -Chest pain has resolved.    Bilateral visual loss  Painless visual loss, has been going on for at least for the past 5 days. Seen by opthalmology Dr Posey Pronto and suggests findings of background proliferative retinopathy. opthalmology will consult retina specialist to evaluate. Her.CT head unremarkable. ESR and CRP mildly elevated. -needs tighter blood glucose control.  -Dr. Deloria Lair, a retina specialist. Has agreed to see Ms Minter per Dr Posey Pronto note.  -Dr Zadie Rhine recommend out patient follow up in 2 to 3 weeks for further care.   UTI (lower urinary tract infection)  - urine cultures.( >100k colonies), Enterobacter. - Sensitive to ciprofloxacin.  -Continue with ciprofloxacin day 7. WBC increase to  13.  -resume cipro.   Sacral decubitus stage II-III Minimal purulent discharge. ESR mildly elevated. Appreciate wound care evaluation. On santyl dressing Patient S/P debridement by surgery 4-15.  No need for antibiotics.  Vancomycin discontinue 4-16.  Dressing changes BID.  Pain better today.  Surgery recommending hydrotherapy.   Left little finger gangrene  - History of left hand steal syndrome.  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 No sign of infection.  Anemia  - Secondary to end-stage  renal disease.  - Monitor hemoglobin and hematocrit, erythropoietin per nephrology.   ESRD on dialysis  Renal following. HD per schedule.. Continue hectoral and Fosrenol with meals. Patient now wants to continue with dialysis.   DM type 2, uncontrolled, with renal complications  - Continue Lantus. 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 Retina specialist to see. Needs tighter blood glucose control. A1C of 8.1 and has elevated fsg. continue SSI. Hypoglycemia.  lantus discontinue.   Multifocal atrial tachycardia  Currently stable on telemetry.  Chronic systolic heart failure  EF of 30-35% on recent echo. Appears compensated. Continue beta blocker, indoor and hydralazine.  Essential hypertension  - Stable, continue amlodipine, Coreg. Added hydralazine. Dose of Imdur increased. BP stable.  COPD (chronic obstructive pulmonary disease)  nebulized bronchodilators as needed  PVD (peripheral vascular disease)-status post recent right AKA  - Staples in place, no discharge erythema at the stump site.  -will Notify vascular for staples removal.  History of prior VTE  - Recently taken off anticoagulation.  - Review of past medical history shows a history of peroneal vein DVT, however a recent lower extremity Doppler was negative.  -Since VQ scan is negative will not repeat Doppler lower extremity    Code Status: DO NOT RESUSCITATE Family Communication: discussed with patient. Daughter 39-17.  Disposition Plan: waiting SNF. Need dialysis set up.    Consultants:  Cardiology  Ophthalmology  Renal  Wound care  palliative care  Procedures:  None  Antibiotics: IV vancomycin  cipro   HPI/Subjective: Pain on and off. Pain medications helping.  She was getting hydrotherapy when I saw her. No new complaints.   Objective: Filed Vitals:  01/31/14 1330  BP: 150/68  Pulse: 84  Temp: 97.8 F (36.6 C)  Resp: 20    Intake/Output Summary (Last 24 hours) at 01/31/14  1604 Last data filed at 01/31/14 1330  Gross per 24 hour  Intake      0 ml  Output   2544 ml  Net  -2544 ml   Filed Weights   01/29/14 1754 01/31/14 0915 01/31/14 1330  Weight: 55 kg (121 lb 4.1 oz) 55.4 kg (122 lb 2.2 oz) 51.9 kg (114 lb 6.7 oz)    Exam:   General:  Elderly female in NAD,   HEENT: no pallor, pupils reactive b/l, moist mucosa.   Chest: clear b/l  CVS: NS1&S2, no murmurs,   Abd: soft, NT ND BS+  Ext: warm, no edema, rt AKA with clean staples  CNS: AAOX3,   Skin; sacral wound.   Data Reviewed: Basic Metabolic Panel:  Recent Labs Lab 01/25/14 0851 01/26/14 0516 01/27/14 2015 01/31/14 0930  NA 137 137 135* 136*  K 4.0 3.8 4.4 3.4*  CL 93* 95* 92* 94*  CO2 28 28 26 29   GLUCOSE 89 77 95 155*  BUN 41* 48* 49* 21  CREATININE 5.02* 5.30* 5.78* 3.94*  CALCIUM 9.8 9.9 9.7 9.3  PHOS 2.2*  --  3.0 2.9   Liver Function Tests:  Recent Labs Lab 01/25/14 0851 01/27/14 2015 01/31/14 0930  ALBUMIN 1.9* 1.9* 1.9*   No results found for this basename: LIPASE, AMYLASE,  in the last 168 hours No results found for this basename: AMMONIA,  in the last 168 hours CBC:  Recent Labs Lab 01/25/14 0851 01/27/14 2014 01/28/14 0500 01/31/14 0930  WBC 8.8 12.5* 11.0* 13.2*  HGB 9.4* 9.6* 9.4* 9.1*  HCT 30.5* 30.7* 30.8* 29.8*  MCV 84.3 83.4 84.8 83.5  PLT 370 372 311 327   Cardiac Enzymes: No results found for this basename: CKTOTAL, CKMB, CKMBINDEX, TROPONINI,  in the last 168 hours BNP (last 3 results)  Recent Labs  08/17/13 1603 12/22/13 1809 01/23/14 1140  PROBNP 28521.0* >70000.0* >70000.0*   CBG:  Recent Labs Lab 01/30/14 1126 01/30/14 1523 01/30/14 1609 01/30/14 2040 01/31/14 0832  GLUCAP 150* 206* 152* 172* 120*    Recent Results (from the past 240 hour(s))  URINE CULTURE     Status: None   Collection Time    01/23/14  1:34 PM      Result Value Ref Range Status   Specimen Description URINE, CLEAN CATCH   Final   Special  Requests ADD [id number]   Final   Culture  Setup Time     Final   Value: 01/23/2014 17:34     Performed at [facility]     Final   Value: >=100,000 COLONIES/ML     Performed at [facility]   Culture     Final   Value: ENTEROBACTER AEROGENES     Performed at [facility]   Report Status 01/25/2014 FINAL   Final   Organism ID, Bacteria ENTEROBACTER AEROGENES   Final  MRSA PCR SCREENING     Status: None   Collection Time    01/23/14  9:16 PM      Result Value Ref Range Status   MRSA by PCR NEGATIVE  NEGATIVE Final   Comment:            The GeneXpert MRSA Assay (FDA     approved for NASAL specimens  only), is one component of a     comprehensive MRSA colonization     surveillance program. It is not     intended to diagnose MRSA     infection nor to guide or     monitor treatment for     MRSA infections.     Studies: No results found.  Scheduled Meds: . amLODipine  10 mg Oral Daily  . atorvastatin  40 mg Oral Daily  . carvedilol  12.5 mg Oral BID WC  . ciprofloxacin  250 mg Oral BID  . clopidogrel  75 mg Oral Daily  . collagenase   Topical Daily  . darbepoetin (ARANESP) injection - DIALYSIS  100 mcg Intravenous Q Wed-HD  . feeding supplement (NEPRO CARB STEADY)  237 mL Oral TID WC  . folic acid  1 mg Oral Daily  . heparin subcutaneous  5,000 Units Subcutaneous 3 times per day  . hydrALAZINE  25 mg Oral BID  . insulin aspart  0-9 Units Subcutaneous TID WC  . isosorbide mononitrate  30 mg Oral Daily  . lidocaine-EPINEPHrine  20 mL Intradermal Once  . mirtazapine  7.5 mg Oral QHS  . multivitamin  1 tablet Oral QHS  . omega-3 acid ethyl esters  1 g Oral QID  . pantoprazole  40 mg Oral Daily  . sodium chloride  3 mL Intravenous Q12H  . vitamin C  500 mg Oral Daily  . zinc sulfate  220 mg Oral Daily   Continuous Infusions:     Time spent: [facility] Pager [phone] If 7PM-7AM,  please contact night-coverage at [email or web address], password [facility] 01/31/2014, 4:04 PM  LOS: 8 days

## 2014-01-31 NOTE — Progress Notes (Signed)
Tammy Mebane RN, DD spoke with patient's daughter, [name] and daughter agrees to accept bed offer at [facility].  Will update CSW in the morning.  Colman Cater

## 2014-01-31 NOTE — Progress Notes (Signed)
01/31/14 1012  OT Visit Information  Last OT Received On 01/31/14  Reason Eval/Treat Not Completed Patient at procedure or test/ unavailable- Pt in HD   [place], OTR/L [phone]

## 2014-01-31 NOTE — Progress Notes (Addendum)
NUTRITION FOLLOW-UP  DOCUMENTATION CODES Per approved criteria  -Severe malnutrition in the context of chronic illness   INTERVENTION:  Continue Nepro Shake PO TID, each supplement provides 425 kcal and 19 grams protein  NUTRITION DIAGNOSIS: Increased nutrient needs related to wounds as evidenced by estimated nutrition needs. Ongoing.  Goal: Intake to meet >90% of estimated nutrition needs. Unmet.  Monitor:  PO intake, labs, weight trend.  ASSESSMENT: Patient is a 73 y.o. female with a past medical history of end-stage renal disease recently started on dialysis, history of peripheral vascular disease status post recent right AKA, diabetes, history of coronary artery disease status post CABG in 2009, and a recent positive nuclear stress test on medical management, hypertension, chronic systolic heart failure who presented to the hospital today from a skilled nursing facility for evaluation of chest pain. During her recent hospitalization (discharge on 01/15/14) patient underwent a right AKA and also was started on hemodialysis.  Per previous hospital notes, patient has not wanted artificial nutrition in the past. Palliative Care Team is following patient. Patient is receiving hydrotherapy to sacral decubitus ulcer. [facility] surgery team following patient.  PO intake has been variable, patient consuming 20-100% of meals. Also drinking some Nepro.  Current weight is 79% of usual weight from 1 month ago. Some weight loss related to recent amputation on 01/12/14. Since amputation, patient has lost another 13 lbs = 10% weight loss.  Patient now meets criteria for severe MALNUTRITION in the context of chronic illness as evidenced by intake </= 75% of estimated energy requirement for > 1 month with 10% weight loss in the past month.  Height: Ht Readings from Last 1 Encounters:  01/23/14 5\' 3"  (1.6 m)    Weight: Wt Readings from Last 1 Encounters:  01/29/14 121 lb 4.1 oz (55 kg)  01/24/14  130 lb  1.1 oz (59 kg)   BMI = 22.8  Ideal Body Weight: 49.2 kg (adjusted for AKA)   Usual Body Weight: 134 lb (after recent amputation)  % Usual Body Weight: 90%  Estimated Nutritional Needs: Kcal: 1750-2050 Protein: 85-100 gm Fluid: 1.2 L  Skin: unstageable wounds to sacrum and buttocks; stage 2 wound to inner gluteal fold   Diet Order: Renal / CHO-modified with 1200 ml fluid restriction; Nepro Shake PO TID  EDUCATION NEEDS: -Education needs addressed   Intake/Output Summary (Last 24 hours) at 01/31/14 0858 Last data filed at 01/30/14 1200  Gross per 24 hour  Intake    750 ml  Output      0 ml  Net    750 ml    Last BM: 4/19   Labs:   Recent Labs Lab 01/25/14 0851 01/26/14 0516 01/27/14 2015  NA 137 137 135*  K 4.0 3.8 4.4  CL 93* 95* 92*  CO2 28 28 26   BUN 41* 48* 49*  CREATININE 5.02* 5.30* 5.78*  CALCIUM 9.8 9.9 9.7  PHOS 2.2*  --  3.0  GLUCOSE 89 77 95    CBG (last 3)   Recent Labs  01/30/14 1609 01/30/14 2040 01/31/14 0832  GLUCAP 152* 172* 120*    Scheduled Meds: . amLODipine  10 mg Oral Daily  . atorvastatin  40 mg Oral Daily  . carvedilol  12.5 mg Oral BID WC  . clopidogrel  75 mg Oral Daily  . collagenase   Topical Daily  . darbepoetin (ARANESP) injection - DIALYSIS  100 mcg Intravenous Q Wed-HD  . feeding supplement (NEPRO CARB STEADY)  237 mL Oral  TID WC  . folic acid  1 mg Oral Daily  . heparin subcutaneous  5,000 Units Subcutaneous 3 times per day  . hydrALAZINE  25 mg Oral BID  . insulin aspart  0-9 Units Subcutaneous TID WC  . isosorbide mononitrate  30 mg Oral Daily  . lidocaine-EPINEPHrine  20 mL Intradermal Once  . mirtazapine  7.5 mg Oral QHS  . multivitamin  1 tablet Oral QHS  . omega-3 acid ethyl esters  1 g Oral QID  . pantoprazole  40 mg Oral Daily  . sodium chloride  3 mL Intravenous Q12H  . vitamin C  500 mg Oral Daily  . zinc sulfate  220 mg Oral Daily    Continuous Infusions:   Past Medical History   Diagnosis Date  . CAD (coronary artery disease)   . Hypertension   . Diabetes mellitus     type 2 insulin dependent  . Hyperlipidemia   . CHF (congestive heart failure)   . Cellulitis     left foot  . Ulcer     diabetic ulcer on left foot  . DVT (deep venous thrombosis)     peroneal vein  . Peripheral vascular disease   . Chronic kidney disease   . Anemia   . Shortness of breath     with fluid  . Insomnia     Past Surgical History  Procedure Laterality Date  . Tubal ligation    . Pr vein bypass graft,aorto-fem-pop  06-26-11    1. PATENT LEFT FEMORAL-POPLITEAL BYPASS GRAFT W/NO EVEIDENCE OF STENOSIS. 2.VELOCITIES OF GREATER THAN 200 CM/'s NOTED ON PREVIOUS EXAM 10/03/11 WERE NOT ADEQUATELY VISULAIZED DURING THIS EXAM  . Coronary artery bypass graft  04/25/08    Dr. Tyrone Sage, CABG x 5   . Nm myoview ltd  01/13/13    LEXISCAN; LV WALL MOTION: LVEF 44%, INFERIOR AKINESIS, ANTEROAPICAL HYPOKINESIS  . Cardiac catheterization  04/22/08    SEVERE 3-VESSEL DISEASE CAD., CABG X 5 04/25/08 WITH DR. [id number]  . Av fistula placement Left 09/07/2013    Procedure: ARTERIOVENOUS (AV) FISTULA CREATION;  Surgeon: Sherren Kerns, MD;  Location[facility] OR;  Service: Vascular;  Laterality: Left;  . Ligation arteriovenous gortex graft Left 01/09/2014    Procedure: LIGATION ARTERIOVENOUS GORTEX GRAFT;  Surgeon: Sherren Kerns, MD;  Location: [facility] OR;  Service: Vascular;  Laterality: Left;  . Insertion of dialysis catheter Right 01/09/2014    Procedure: INSERTION OF DIALYSIS CATHETER;  Surgeon: Sherren Kerns, MD;  Location: [facility] OR;  Service: Vascular;  Laterality: Right;  Insertion of diatek to right internal Jugular artery.  . Amputation Right 01/12/2014    Procedure: AMPUTATION ABOVE KNEE- RIGHT;  Surgeon: Pryor Ochoa, MD;  Location[facility] OR;  Service: Vascular;  Laterality: Right;  . 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

## 2014-01-31 NOTE — Progress Notes (Signed)
Hydrotherapy Cancellation Note  Patient Details Name: [name] MRN: [id number] DOB: Oct 15, 1940   Cancelled Treatment:    Reason Eval/Treat Not Completed: Patient at procedure or test/unavailable (Pt in HD). Will re-attempt this afternoon if pt out of HD.   Angelina Ok Shalom Mcguiness 01/31/2014, 9:32 AM  Skip Mayer PT [phone]

## 2014-01-31 NOTE — Progress Notes (Signed)
CSW has attempted to contact the patient's daughter 15x to discuss SNF options. Patient's daughter has not returned any phone calls. CSW will speak with the patient directly.   Sabino Niemann, MSW, [employer] [phone]

## 2014-01-31 NOTE — Progress Notes (Signed)
Physical Therapy Wound Treatment Patient Details  Name: [name] MRN: [id number] Date of Birth: 04/14/1941  Today's Date: 01/31/2014 Time: 0626-9485 Time Calculation (min): 32 min  Subjective  Subjective: "I wish I could see my sore."  Pain Score: Pt premedicated.  Wound Assessment  Pressure Ulcer 01/09/14 Stage II -  Partial thickness loss of dermis presenting as a shallow open ulcer with a red, pink wound bed without slough. 2-3 very small open areas foam dsg applied and tq 2hrs. (Active)  Dressing Type Moist to dry;Gauze (Comment);Barrier Film (skin prep);ABD 01/31/2014  2:52 PM  Dressing Changed 01/31/2014  2:52 PM  Dressing Change Frequency Twice a day 01/31/2014  2:52 PM  State of Healing Early/partial granulation 01/31/2014  2:52 PM  Site / Wound Assessment Pink;Red;Yellow 01/31/2014  2:52 PM  % Wound base Red or Granulating 90% 01/31/2014  2:52 PM  % Wound base Yellow 10% 01/31/2014  2:52 PM  % Wound base Black 0% 01/31/2014  2:52 PM  % Wound base Other (Comment) 0% 01/31/2014  2:52 PM  Peri-wound Assessment Intact 01/31/2014  2:52 PM  Wound Length (cm) 1.5 cm 01/31/2014  2:52 PM  Wound Width (cm) 2 cm 01/31/2014  2:52 PM  Wound Depth (cm) 0.2 cm 01/31/2014  2:52 PM  Margins Unattached edges (unapproximated) 01/31/2014  2:52 PM  Drainage Amount Moderate 01/31/2014  2:52 PM  Drainage Description Serosanguineous 01/31/2014  2:52 PM  Treatment Hydrotherapy (Pulse lavage);Packing (Saline gauze) 01/31/2014  2:52 PM     Wound / Incision (Open or Dehisced) 01/28/14 Sacrum  (Active)  Dressing Type Moist to dry;Gauze (Comment);Barrier Film (skin prep);ABD 01/31/2014  2:52 PM  Dressing Changed Changed 01/31/2014  2:52 PM  Dressing Status Clean;Dry;Intact 01/31/2014  2:52 PM  Dressing Change Frequency Twice a day 01/31/2014  2:52 PM  Site / Wound Assessment Bleeding;Pink;Red;Yellow 01/31/2014  2:52 PM  % Wound base Red or Granulating 70% 01/31/2014  2:52 PM  % Wound base Yellow 30% 01/31/2014  2:52  PM  % Wound base Black 0% 01/31/2014  2:52 PM  % Wound base Other (Comment) 0% 01/31/2014  2:52 PM  Peri-wound Assessment Intact 01/31/2014  2:52 PM  Wound Length (cm) 3.5 cm 01/31/2014  2:52 PM  Wound Width (cm) 4.5 cm 01/31/2014  2:52 PM  Wound Depth (cm) 0.5 cm 01/31/2014  2:52 PM  Margins Unattached edges (unapproximated) 01/31/2014  2:52 PM  Closure None 01/31/2014  2:52 PM  Drainage Amount Moderate 01/31/2014  2:52 PM  Drainage Description Serosanguineous 01/31/2014  2:52 PM  Non-staged Wound Description Full thickness 01/31/2014  6:00 AM  Treatment Hydrotherapy (Pulse lavage);Packing (Saline gauze) 01/31/2014  2:52 PM      Hydrotherapy Pulsed lavage therapy - wound location: sacrum and lt buttock Pulsed Lavage with Suction (psi): 8 psi Pulsed Lavage with Suction - Normal Saline Used: 1000 mL Pulsed Lavage Tip: Tip with splash shield   Wound Assessment and Plan  Wound Therapy - Assess/Plan/Recommendations Wound Therapy - Clinical Statement: Pt presents to hydrotherapy with sacral wound. Can benefit from hydrotherapy to decr necrotic tissue and promote healing. Wound Therapy - Functional Problem List: Decr sitting tolerance Factors Delaying/Impairing Wound Healing: Multiple medical problems;Immobility;Diabetes Mellitus;Polypharmacy Hydrotherapy Plan: Debridement;Dressing change;Patient/family education;Pulsatile lavage with suction Wound Therapy - Frequency: 6X / week Wound Therapy - Follow Up Recommendations: Skilled nursing facility Wound Plan: See above  Wound Therapy Goals- Improve the function of patient's integumentary system by progressing the wound(s) through the phases of wound healing (inflammation - proliferation - remodeling)  by: Decrease Necrotic Tissue to: 10 Decrease Necrotic Tissue - Progress: Goal set today Increase Granulation Tissue to: 90 Increase Granulation Tissue - Progress: Goal set today Goals/treatment plan/discharge plan were made with and agreed upon by  patient/family: Yes Time For Goal Achievement: 7 days Wound Therapy - Potential for Goals: Good  Goals will be updated until maximal potential achieved or discharge criteria met.  Discharge criteria: when goals achieved, discharge from hospital, MD decision/surgical intervention, no progress towards goals, refusal/missing three consecutive treatments without notification or medical reason.  GP     Shary Decamp Jerrian Mells 01/31/2014, 3:36 PM  [place]

## 2014-01-31 NOTE — Progress Notes (Signed)
I have seen and examined the patient and agree with the assessment and plans.  Jessika Rothery A. Noora Locascio  MD, FACS  

## 2014-01-31 NOTE — Procedures (Signed)
I was present at this dialysis session, have reviewed the session itself and made  appropriate changes  Vinson Moselle MD (pgr) [phone]    ([phone] 01/31/2014, 12:00 PM

## 2014-01-31 NOTE — Progress Notes (Signed)
Patient ID: [name], female   DOB: 11/24/1940, 73 y.o.   MRN: [id number]  Subjective: A bit hostile, but cooperative.  Objective:  Vital signs:  Filed Vitals:   01/30/14 1347 01/30/14 1640 01/30/14 2100 01/31/14 0506  BP: 109/54 120/65 122/82 127/56  Pulse: 81 82 75 83  Temp: 98.3 F (36.8 C)  98.8 F (37.1 C) 98.8 F (37.1 C)  TempSrc: Oral  Oral Oral  Resp: 17  18 18   Height:      Weight:      SpO2: 93% 93% 100% 97%    Last BM Date: 01/30/14  Intake/Output   Yesterday:  04/19 0701 - 04/20 0700 In: 750 [P.O.:750] Out: -  This shift:    I/O last 3 completed shifts: In: 750 [P.O.:750] Out: -     Physical Exam: General: Pt awake/alert/oriented x3in no acute distress Skin: 4x3cm sacral ulcer with slough, right buttock is beefy red, fairly superficial    Problem List:   Principal Problem:   Chest pain Active Problems:   PVD (peripheral vascular disease)   S/P CABG x 5   Chronic systolic heart failure   COPD (chronic obstructive pulmonary disease)   Essential hypertension   Anemia   DM type 2, uncontrolled, with renal complications   Palliative care encounter   ESRD on dialysis   Multifocal atrial tachycardia   UTI (lower urinary tract infection)   Visual loss, bilateral   Malnutrition of moderate degree   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   Blindness of both eyes, impairment level not further specified    Results:   Labs: Results for orders placed during the hospital encounter of 01/23/14 (from the past 48 hour(s))  GLUCOSE, CAPILLARY     Status: Abnormal   Collection Time    01/29/14 11:27 AM      Result Value Ref Range   Glucose-Capillary 139 (*) 70 - 99 mg/dL  GLUCOSE, CAPILLARY     Status: Abnormal   Collection Time    01/29/14  9:10 PM      Result Value Ref Range   Glucose-Capillary 143 (*) 70 - 99 mg/dL  GLUCOSE, CAPILLARY     Status: None   Collection Time    01/30/14  7:19 AM      Result Value Ref Range   Glucose-Capillary  86  70 - 99 mg/dL  GLUCOSE, CAPILLARY     Status: Abnormal   Collection Time    01/30/14 11:26 AM      Result Value Ref Range   Glucose-Capillary 150 (*) 70 - 99 mg/dL  GLUCOSE, CAPILLARY     Status: Abnormal   Collection Time    01/30/14  3:23 PM      Result Value Ref Range   Glucose-Capillary 206 (*) 70 - 99 mg/dL  GLUCOSE, CAPILLARY     Status: Abnormal   Collection Time    01/30/14  4:09 PM      Result Value Ref Range   Glucose-Capillary 152 (*) 70 - 99 mg/dL  GLUCOSE, CAPILLARY     Status: Abnormal   Collection Time    01/30/14  8:40 PM      Result Value Ref Range   Glucose-Capillary 172 (*) 70 - 99 mg/dL  GLUCOSE, CAPILLARY     Status: Abnormal   Collection Time    01/31/14  8:32 AM      Result Value Ref Range   Glucose-Capillary 120 (*) 70 - 99 mg/dL    Imaging /  Studies: No results found.  Medications / Allergies: per chart  Antibiotics: Anti-infectives   Start     Dose/Rate Route Frequency Ordered Stop   01/31/14 1200  vancomycin (VANCOCIN) 500 mg in sodium chloride 0.9 % 100 mL IVPB  Status:  Discontinued     500 mg 100 mL/hr over 60 Minutes Intravenous Every M-W-F (Hemodialysis) 01/26/14 1532 01/27/14 1242   01/27/14 1200  vancomycin (VANCOCIN) 500 mg in sodium chloride 0.9 % 100 mL IVPB  Status:  Discontinued     500 mg 100 mL/hr over 60 Minutes Intravenous Every T-Th-Sa (Hemodialysis) 01/26/14 1532 01/27/14 1242   01/25/14 1800  ciprofloxacin (CIPRO) tablet 500 mg  Status:  Discontinued     500 mg Oral Every 24 hours 01/25/14 1405 01/30/14 1238   01/25/14 1400  vancomycin (VANCOCIN) IVPB 750 mg/150 ml premix  Status:  Discontinued     750 mg 150 mL/hr over 60 Minutes Intravenous Every 48 hours 01/23/14 2032 01/23/14 2112   01/25/14 1200  vancomycin (VANCOCIN) 500 mg in sodium chloride 0.9 % 100 mL IVPB  Status:  Discontinued     500 mg 100 mL/hr over 60 Minutes Intravenous Every T-Th-Sa (Hemodialysis) 01/24/14 1519 01/25/14 1404   01/24/14 1500   cefTRIAXone (ROCEPHIN) 1 g in dextrose 5 % 50 mL IVPB  Status:  Discontinued     1 g 100 mL/hr over 30 Minutes Intravenous Every 24 hours 01/23/14 2011 01/25/14 1404   01/24/14 1200  vancomycin (VANCOCIN) 500 mg in sodium chloride 0.9 % 100 mL IVPB  Status:  Discontinued     500 mg 100 mL/hr over 60 Minutes Intravenous Every M-W-F (Hemodialysis) 01/23/14 2113 01/24/14 1519   01/23/14 1600  vancomycin (VANCOCIN) IVPB 1000 mg/200 mL premix     1,000 mg 200 mL/hr over 60 Minutes Intravenous  Once 01/23/14 1551 01/23/14 1811   01/23/14 1515  cefTRIAXone (ROCEPHIN) 1 g in dextrose 5 % 50 mL IVPB     1 g 100 mL/hr over 30 Minutes Intravenous  Once 01/23/14 1503 01/23/14 1641   01/23/14 1515  vancomycin (VANCOCIN) injection 1,000 mg  Status:  Discontinued     1,000 mg Intravenous  Once 01/23/14 1503 01/23/14 1549      Assessment/Plan Decubitus ulcer of buttock/sacrum s/p bedside debridement Start hydrotherapy, santyl Continue with BID wet to dry dressing changes Surgery will follow  Ashok Norris, [facility] Surgery Pager [phone] Office [phone]  01/31/2014 8:50 AM

## 2014-01-31 NOTE — Progress Notes (Signed)
  [facility] Progress Note   Subjective: Sitting up on side of bed, no complaints. Feeding herself.   Filed Vitals:   01/30/14 1347 01/30/14 1640 01/30/14 2100 01/31/14 0506  BP: 109/54 120/65 122/82 127/56  Pulse: 81 82 75 83  Temp: 98.3 F (36.8 C)  98.8 F (37.1 C) 98.8 F (37.1 C)  TempSrc: Oral  Oral Oral  Resp: 17  18 18   Height:      Weight:      SpO2: 93% 93% 100% 97%   Exam: Frail elderly AAF no distress Chest clear bilat RRR no MRG Abd soft, NTND RLE AKA, no LE edema Neuro is nonfocal, gen weakness, ox3 R IJ Diatek cath  Dialysis: MWF [facility] 4h   57kg   2/2.25 [place]    400/A1.5   Heparin 1800   R IJ Cath Hectorol 2   EPO 6000   Venofer 50/wk  Assessment: 1 Chest pain- resolved, not candidate for cath or surgery per cardiology 2 Visual loss- diab retinopathy, for OP referral 3 Sacral decub stg II-III 4 ESRD  5 Anemia on aranesp 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 7 PVD w R AKA 8 Enterobacter UTI- on cipro 9 DNR 10 DM2 11 Chronic systolic HF (EF 30%) 12 Dispo- awaiting SNF              Plan- HD today    Vinson Moselle MD  pager [phone]    cell [phone]  01/31/2014, 8:49 AM     Recent Labs Lab 01/25/14 0851 01/26/14 0516 01/27/14 2015  NA 137 137 135*  K 4.0 3.8 4.4  CL 93* 95* 92*  CO2 28 28 26   GLUCOSE 89 77 95  BUN 41* 48* 49*  CREATININE 5.02* 5.30* 5.78*  CALCIUM 9.8 9.9 9.7  PHOS 2.2*  --  3.0    Recent Labs Lab 01/25/14 0851 01/27/14 2015  ALBUMIN 1.9* 1.9*    Recent Labs Lab 01/25/14 0851 01/27/14 2014 01/28/14 0500  WBC 8.8 12.5* 11.0*  HGB 9.4* 9.6* 9.4*  HCT 30.5* 30.7* 30.8*  MCV 84.3 83.4 84.8  PLT 370 372 311   . amLODipine  10 mg Oral Daily  . atorvastatin  40 mg Oral Daily  . carvedilol  12.5 mg Oral BID WC  . clopidogrel  75 mg Oral Daily  . collagenase   Topical Daily  . darbepoetin (ARANESP) injection - DIALYSIS  100 mcg Intravenous Q Wed-HD  . feeding supplement (NEPRO CARB STEADY)  237 mL Oral  TID WC  . folic acid  1 mg Oral Daily  . heparin subcutaneous  5,000 Units Subcutaneous 3 times per day  . hydrALAZINE  25 mg Oral BID  . insulin aspart  0-9 Units Subcutaneous TID WC  . isosorbide mononitrate  30 mg Oral Daily  . lidocaine-EPINEPHrine  20 mL Intradermal Once  . mirtazapine  7.5 mg Oral QHS  . multivitamin  1 tablet Oral QHS  . omega-3 acid ethyl esters  1 g Oral QID  . pantoprazole  40 mg Oral Daily  . sodium chloride  3 mL Intravenous Q12H  . vitamin C  500 mg Oral Daily  . zinc sulfate  220 mg Oral Daily     sodium chloride, acetaminophen, acetaminophen, albuterol, alum & mag hydroxide-simeth, diphenhydrAMINE, guaiFENesin-dextromethorphan, nitroGLYCERIN, ondansetron (ZOFRAN) IV, ondansetron, oxyCODONE, senna-docusate, sodium chloride, traMADol

## 2014-01-31 NOTE — Consult Note (Signed)
I have reviewed and discussed the care of this patient in detail with the nurse practitioner including pertinent patient records, physical exam findings and data. I agree with details of this encounter.  

## 2014-01-31 NOTE — Progress Notes (Signed)
PT Cancellation Note  Patient Details Name: [name] MRN: [id number] DOB: Dec 03, 1940   Cancelled Treatment:    Reason Eval/Treat Not Completed: Patient at procedure or test/unavailable, currently in HD and unavailable   Patrick Salemi B Nancyann Cotterman 01/31/2014, 10:13 AM Delaney Meigs, PT [phone]

## 2014-02-01 ENCOUNTER — Encounter (HOSPITAL_COMMUNITY): Payer: Self-pay | Admitting: Nephrology

## 2014-02-01 DIAGNOSIS — E1165 Type 2 diabetes mellitus with hyperglycemia: Secondary | ICD-10-CM

## 2014-02-01 DIAGNOSIS — R079 Chest pain, unspecified: Secondary | ICD-10-CM

## 2014-02-01 DIAGNOSIS — E1129 Type 2 diabetes mellitus with other diabetic kidney complication: Secondary | ICD-10-CM

## 2014-02-01 DIAGNOSIS — D649 Anemia, unspecified: Secondary | ICD-10-CM

## 2014-02-01 LAB — GLUCOSE, CAPILLARY
Glucose-Capillary: 155 mg/dL — ABNORMAL HIGH (ref 70–99)
Glucose-Capillary: 123 mg/dL — ABNORMAL HIGH (ref 70–99)
Glucose-Capillary: 187 mg/dL — ABNORMAL HIGH (ref 70–99)
Glucose-Capillary: 126 mg/dL — ABNORMAL HIGH (ref 70–99)

## 2014-02-01 LAB — CBC
HCT: 33.2 % — ABNORMAL LOW (ref 36.0–46.0)
Hemoglobin: 10.3 g/dL — ABNORMAL LOW (ref 12.0–15.0)
MCH: 25.9 pg — AB (ref 26.0–34.0)
MCHC: 31 g/dL (ref 30.0–36.0)
MCV: 83.6 fL (ref 78.0–100.0)
PLATELETS: 368 10*3/uL (ref 150–400)
RBC: 3.97 MIL/uL (ref 3.87–5.11)
RDW: 17.2 % — AB (ref 11.5–15.5)
WBC: 14.9 10*3/uL — AB (ref 4.0–10.5)

## 2014-02-01 MED ORDER — FENTANYL 25 MCG/HR TD PT72
25.0000 ug | MEDICATED_PATCH | TRANSDERMAL | Status: DC
  Administered 2014-02-01: 25 ug via TRANSDERMAL
  Filled 2014-02-01: qty 1

## 2014-02-01 MED ORDER — MORPHINE SULFATE 2 MG/ML IJ SOLN
1.0000 mg | INTRAMUSCULAR | Status: DC | PRN
  Administered 2014-02-02: 1 mg via INTRAVENOUS

## 2014-02-01 NOTE — Progress Notes (Addendum)
[facility] PROGRESS NOTE  [name] ZOX:[id number] DOB: 12-Nov-1940 DOA: 01/23/2014 PCP: Rhonda Bolt, MD  Assessment/Plan: 73 year old with female with a Past Medical History end-stage renal disease recently started on dialysis, history of peripheral vascular disease status post recent right AKA, diabetes, history o fcoronary artery disease status post CABG in 2009, and a recent positive nuclear stress test on medical management, hypertension, chronic systolic heart failure who presented to the hospital today from a skilled nursing facility for evaluation of chest pain. Cardiology evaluated patient and medical treatment was recommended. Patient also had bilateral vision loss, she was evaluated by opthalmology Dr [name] and Dr [name] retinal specialist, recommendation was to follow up out patient in 2 to 3 weeks. She has been getting ciprofloxacin for enterobacter UTI. She has decubitus ulcer for which she underwent debridement by surgery. She is receiving Hydrotherapy.   1-Chest pain  Denies further pain.  Hx of significant CAD. troponins are negative. cardiology evaluated the pt and recommended not a candidate  cardiac cath and she is not a candidate for CABG. she has high bleeding risk on dual antiplatelet therapy following PCI.  Also has severe cardiomyopathy but is not a candidate for AICD Given co morbidities and poor long term prognosis..   Recommend optimal medical management. Increased dose of imdur. added hydralazine.  continue coreg. Patient has been allergy to aspirin. Started on plavix. -VQ scan with low probability for PE. -Chest pain has resolved.    Bilateral visual loss  Painless visual loss, has been going on for at least for the past 5 days. Seen by opthalmology Dr [name] and suggests findings of background proliferative retinopathy. opthalmology will consult retina specialist to evaluate. Her.CT head unremarkable. ESR and CRP mildly elevated. -needs tighter blood  glucose control.  -Dr. Deloria [name], a retina specialist. Has agreed to see [name] per Dr [name] note.  -Dr [name] recommend out patient follow up in 2 to 3 weeks for further care.   UTI (lower urinary tract infection)  - urine cultures.( >100k colonies), Enterobacter. - Sensitive to ciprofloxacin.  -Continue with ciprofloxacin day 8/10. WBC increase to  14. Monitor, patient remain afebrile.    Sacral decubitus stage II-III Minimal purulent discharge. ESR mildly elevated. Appreciate wound care evaluation. On santyl dressing Patient S/P debridement by surgery 4-15.  No need for antibiotics.  Vancomycin discontinue 4-16.  Dressing changes BID.  Surgery recommending hydrotherapy.  Patient and daughter relates pain is worse, I will start fentanyl patch. Continue with Oxycodone. Advised patient to ask for pain medications as needed. Could consider increase oxycodone dose.   Left little finger gangrene  - History of left hand steal syndrome.  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 No sign of infection.  Anemia  - Secondary to end-stage renal disease.  - Monitor hemoglobin and hematocrit, erythropoietin per nephrology.   ESRD on dialysis  Renal following. HD per schedule.. Continue hectoral and Fosrenol with meals. Patient now wants to continue with dialysis.   DM type 2, uncontrolled, with renal complications  - Continue Lantus. 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 Retina specialist to see. Needs tighter blood glucose control. A1C of 8.1 and has elevated fsg. continue SSI. Hypoglycemia.  lantus discontinue.   Multifocal atrial tachycardia  Currently stable on telemetry.  Chronic systolic heart failure  EF of 30-35% on recent echo. Appears compensated. Continue beta blocker, indoor and hydralazine.  Essential hypertension  - Stable, continue amlodipine, Coreg. Added hydralazine. Dose of Imdur increased. BP stable.  COPD (chronic obstructive pulmonary disease)  nebulized  bronchodilators as needed  PVD (peripheral vascular disease)-status post recent right AKA  - Staples in place, no discharge erythema at the stump site.  -will Notify vascular for staples removal.  History of prior VTE  - Recently taken off anticoagulation.  - Review of past medical history shows a history of peroneal vein DVT, however a recent lower extremity Doppler was negative.  -Since VQ scan is negative will not repeat Doppler lower extremity  Small Hematoma thigh; painmight need to hold heparin.  Discussed with daughter and Dr [name].   Code Status: DO NOT RESUSCITATE Family Communication: discussed with patient. Daughter 70-17.  Disposition Plan: waiting SNF. Need dialysis set up.    Consultants:  Cardiology  Ophthalmology  Renal  Wound care  palliative care  Procedures:  None  Antibiotics: IV vancomycin  cipro   HPI/Subjective: Patient complaining of severe pain. Her pain has not been well controlled. Patient only got one dose of oxycodone yesterday. Explain to patient and daughter that she needs to ask for pain medications as needed. Will try fentanyl patch.   Objective: Filed Vitals:   02/01/14 1057  BP: 130/56  Pulse:   Temp:   Resp:    No intake or output data in the 24 hours ending 02/01/14 1455 Filed Weights   01/31/14 0915 01/31/14 1330 02/01/14 0454  Weight: 55.4 kg (122 lb 2.2 oz) 51.9 kg (114 lb 6.7 oz) 52.3 kg (115 lb 4.8 oz)    Exam:   General:  Elderly female in NAD,   HEENT: no pallor, pupils reactive b/l, moist mucosa.   Chest: clear b/l  CVS: NS1&S2, no murmurs,   Abd: soft, NT ND BS+  Ext: warm, no edema, rt AKA with clean staples  CNS: AAOX3,   Skin; sacral wound.   Data Reviewed: Basic Metabolic Panel:  Recent Labs Lab 01/26/14 0516 01/27/14 2015 01/31/14 0930  NA 137 135* 136*  K 3.8 4.4 3.4*  CL 95* 92* 94*  CO2 28 26 29   GLUCOSE 77 95 155*  BUN 48* 49* 21  CREATININE 5.30* 5.78* 3.94*  CALCIUM 9.9  9.7 9.3  PHOS  --  3.0 2.9   Liver Function Tests:  Recent Labs Lab 01/27/14 2015 01/31/14 0930  ALBUMIN 1.9* 1.9*   No results found for this basename: LIPASE, AMYLASE,  in the last 168 hours No results found for this basename: AMMONIA,  in the last 168 hours CBC:  Recent Labs Lab 01/27/14 2014 01/28/14 0500 01/31/14 0930 02/01/14 0549  WBC 12.5* 11.0* 13.2* 14.9*  HGB 9.6* 9.4* 9.1* 10.3*  HCT 30.7* 30.8* 29.8* 33.2*  MCV 83.4 84.8 83.5 83.6  PLT 372 311 327 368   Cardiac Enzymes: No results found for this basename: CKTOTAL, CKMB, CKMBINDEX, TROPONINI,  in the last 168 hours BNP (last 3 results)  Recent Labs  08/17/13 1603 12/22/13 1809 01/23/14 1140  PROBNP 28521.0* >70000.0* >70000.0*   CBG:  Recent Labs Lab 01/31/14 0832 01/31/14 1624 01/31/14 2124 02/01/14 0748 02/01/14 1140  GLUCAP 120* 261* 167* 155* 123*    Recent Results (from the past 240 hour(s))  URINE CULTURE     Status: None   Collection Time    01/23/14  1:34 PM      Result Value Ref Range Status   Specimen Description URINE, [place]   Final   Special Requests ADD [id number]   Final   Culture  Setup Time  Final   Value: 01/23/2014 17:34     Performed at [facility]     Final   Value: >=100,000 COLONIES/ML     Performed at [facility]   Culture     Final   Value: ENTEROBACTER AEROGENES     Performed at [facility]   Report Status 01/25/2014 FINAL   Final   Organism ID, Bacteria ENTEROBACTER AEROGENES   Final  MRSA PCR SCREENING     Status: None   Collection Time    01/23/14  9:16 PM      Result Value Ref Range Status   MRSA by PCR NEGATIVE  NEGATIVE Final   Comment:            The GeneXpert MRSA Assay (FDA     approved for NASAL specimens     only), is one component of a     comprehensive MRSA colonization     surveillance program. It is not     intended to diagnose MRSA     infection nor to guide or     monitor treatment  for     MRSA infections.     Studies: No results found.  Scheduled Meds: . atorvastatin  40 mg Oral Daily  . carvedilol  12.5 mg Oral BID WC  . ciprofloxacin  250 mg Oral BID  . clopidogrel  75 mg Oral Daily  . collagenase   Topical Daily  . darbepoetin (ARANESP) injection - DIALYSIS  100 mcg Intravenous Q Wed-HD  . feeding supplement (NEPRO CARB STEADY)  237 mL Oral TID WC  . fentaNYL  25 mcg Transdermal Q72H  . folic acid  1 mg Oral Daily  . heparin subcutaneous  5,000 Units Subcutaneous 3 times per day  . insulin aspart  0-9 Units Subcutaneous TID WC  . isosorbide mononitrate  30 mg Oral Daily  . lidocaine-EPINEPHrine  20 mL Intradermal Once  . mirtazapine  7.5 mg Oral QHS  . multivitamin  1 tablet Oral QHS  . omega-3 acid ethyl esters  1 g Oral QID  . pantoprazole  40 mg Oral Daily  . sodium chloride  3 mL Intravenous Q12H  . vitamin C  500 mg Oral Daily  . zinc sulfate  220 mg Oral Daily   Continuous Infusions:     Time spent: [facility] Pager [phone] If 7PM-7AM, please contact night-coverage at [email or web address], password [facility] 02/01/2014, 2:55 PM  LOS: 9 days

## 2014-02-01 NOTE — Progress Notes (Signed)
Physical Therapy Wound Treatment Patient Details  Name: [name] MRN: [id number] Date of Birth: [birth year]  Today's Date: 02/01/2014 Time: 1205-1249 Time Calculation (min): 44 min  Subjective  Subjective: "I want to apologize for earlier," pt stated referring to her refusal earlier this AM.  Pain Score:  Pt reports she is comfortable.  Wound Assessment  Pressure Ulcer 01/09/14 Stage II -  Partial thickness loss of dermis presenting as a shallow open ulcer with a red, pink wound bed without slough. 2-3 very small open areas foam dsg applied and tq 2hrs. (Active)  Dressing Type Moist to dry;Gauze (Comment);Barrier Film (skin prep);ABD; santyl 02/01/2014  1:55 PM  Dressing Changed 02/01/2014  1:55 PM  Dressing Change Frequency Twice a day 02/01/2014  1:55 PM  State of Healing Early/partial granulation 02/01/2014  1:55 PM  Site / Wound Assessment Pink;Red;Yellow 02/01/2014  1:55 PM  % Wound base Red or Granulating 95% 02/01/2014  1:55 PM  % Wound base Yellow 5% 02/01/2014  1:55 PM  % Wound base Black 0% 02/01/2014  1:55 PM  % Wound base Other (Comment) 0% 02/01/2014  1:55 PM  Peri-wound Assessment Intact 02/01/2014  1:55 PM  Wound Length (cm) 1.5 cm 01/31/2014  2:52 PM  Wound Width (cm) 2 cm 01/31/2014  2:52 PM  Wound Depth (cm) 0.2 cm 01/31/2014  2:52 PM  Margins Unattached edges (unapproximated) 02/01/2014  1:55 PM  Drainage Amount Minimal 02/01/2014  1:55 PM  Drainage Description Serosanguineous 02/01/2014  1:55 PM  Treatment Hydrotherapy (Pulse lavage);Packing (Saline gauze) 02/01/2014  1:55 PM     Wound / Incision (Open or Dehisced) 01/28/14 Sacrum  (Active)  Dressing Type Moist to dry;Gauze (Comment);Barrier Film (skin prep);ABD;Santyl 02/01/2014  1:55 PM  Dressing Changed Changed 02/01/2014  1:55 PM  Dressing Status Clean;Dry;Intact 02/01/2014  1:55 PM  Dressing Change Frequency Twice a day 02/01/2014  1:55 PM  Site / Wound Assessment Pink;Red;Yellow 02/01/2014  1:55 PM  % Wound base Red or  Granulating 65% 02/01/2014  1:55 PM  % Wound base Yellow 35% 02/01/2014  1:55 PM  % Wound base Black 0% 02/01/2014  1:55 PM  % Wound base Other (Comment) 0% 02/01/2014  1:55 PM  Peri-wound Assessment Intact 02/01/2014  1:55 PM  Wound Length (cm) 3.5 cm 01/31/2014  2:52 PM  Wound Width (cm) 4.5 cm 01/31/2014  2:52 PM  Wound Depth (cm) 0.5 cm 01/31/2014  2:52 PM  Margins Unattached edges (unapproximated) 02/01/2014  1:55 PM  Closure None 02/01/2014  1:55 PM  Drainage Amount Minimal 02/01/2014  1:55 PM  Drainage Description Serosanguineous 02/01/2014  1:55 PM  Non-staged Wound Description Full thickness 01/31/2014  6:00 AM  Treatment Hydrotherapy (Pulse lavage);Packing (Saline gauze) 02/01/2014  1:55 PM      Replaced dressing to rt thigh as well due to dressing off. Used Santyl and moist to dry. Hydrotherapy Pulsed lavage therapy - wound location: sacrum and lt buttock Pulsed Lavage with Suction (psi): 8 psi Pulsed Lavage with Suction - Normal Saline Used: 1000 mL Pulsed Lavage Tip: Tip with splash shield   Wound Assessment and Plan  Wound Therapy - Assess/Plan/Recommendations Wound Therapy - Clinical Statement: Pt presents to hydrotherapy with sacral wound. Can benefit from hydrotherapy to decr necrotic tissue and promote healing. Hydrotherapy Plan: Debridement;Dressing change;Patient/family education;Pulsatile lavage with suction Wound Therapy - Frequency: 6X / week Wound Therapy - Follow Up Recommendations: Skilled nursing facility Wound Plan: See above  Wound Therapy Goals- Improve the function of patient's integumentary system by progressing  the wound(s) through the phases of wound healing (inflammation - proliferation - remodeling) by: Decrease Necrotic Tissue to: 10 Decrease Necrotic Tissue - Progress: Progressing toward goal Increase Granulation Tissue to: 90 Increase Granulation Tissue - Progress: Progressing toward goal  Goals will be updated until maximal potential achieved or  discharge criteria met.  Discharge criteria: when goals achieved, discharge from hospital, MD decision/surgical intervention, no progress towards goals, refusal/missing three consecutive treatments without notification or medical reason.  GP     Shary Decamp Shatyra Becka 02/01/2014, 2:13 PM  [employer] PT [phone]

## 2014-02-01 NOTE — Progress Notes (Signed)
Physical Therapy Treatment Patient Details Name: [name] MRN: [id number] DOB: 1941/05/12 Today's Date: 02/01/2014    History of Present Illness 73 y.o. female admitted to [facility] on 01/23/14 with chest pain.  Triponins are (-), per cardiology she is not a candidate for cardiac cath.  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      PT Comments    Pt in chair on arrival stating she required nursing assist to achieve transfer. Pt received ultram this am but not oxycodone and states pain is too severe (although unrated) to attempt mobility. Pt states that she feels like even when nurses are documenting and stating they are giving pain meds she isn't receiving them because she isn't getting any relief and will not attempt mobility unless limited pain. Pt willing to perform very limited movement of RLE and thorough education to pt and dgtr for amputee exercises of hip Add/Abdct, hip extension in supine and sidely, armrest pushups and progression of mobility to hopping with walker. Pt and dgtr verbalized understanding and state they will perform HEP later today as pain allows. Asked if checking for premedication of oxycodone would be appropriate and pt stated not necessarily. Also asked pt if she would like to call for additional pain meds now and she stated no. Will continue to attempt to mobilize and progress HEP with pt as willing to participate.  Follow Up Recommendations        Equipment Recommendations       Recommendations for Other Services       Precautions / Restrictions Precautions Precautions: Fall Precaution Comments: R AKA    Mobility  Bed Mobility               General bed mobility comments: Pt in chair on arrival grossly per pt report stating she required assist but denied attempting to stand or perform  gait  Transfers                    Ambulation/Gait                 Stairs            Wheelchair Mobility    Modified Rankin (Stroke Patients Only)       Balance                                    Cognition Arousal/Alertness: Awake/alert Behavior During Therapy: WFL for tasks assessed/performed Overall Cognitive Status: Impaired/Different from baseline Area of Impairment: Problem solving                    Exercises Amputee Exercises Hip Flexion/Marching: AROM;Right;Other reps (comment);Seated (x 2)    General Comments        Pertinent Vitals/Pain Pt reports pain RLE but unrated    Home Living                      Prior Function            PT Goals (current goals can now be found in the care plan section) Progress towards PT goals: Not progressing toward goals - comment (limited by pain)    Frequency       PT  Plan Current plan remains appropriate (if pt willing to progress mobility)    Co-evaluation             End of Session   Activity Tolerance: Patient limited by pain Patient left: in chair;with call bell/phone within reach;with family/visitor present     Time: 0727-0745 PT Time Calculation (min): 18 min  Charges:  $Therapeutic Activity: 8-22 mins                    G Codes:      Lance Huaracha B Demetric Parslow 02/01/2014, 8:16 AM Delaney MeigsMaija Tabor Feleshia Zundel, PT [phone]

## 2014-02-01 NOTE — Progress Notes (Signed)
Patient ID: [name], female   DOB: 11-24-1940, 73 y.o.   MRN: [id number]    Subjective: Pt feels ok today.  No new c/o  Objective: Vital signs in last 24 hours: Temp:  [97.8 F (36.6 C)-98.6 F (37 C)] 98.6 F (37 C) (04/21 0454) Pulse Rate:  [69-88] 88 (04/21 0808) Resp:  [14-20] 18 (04/21 0454) BP: (103-156)/(55-77) 103/55 mmHg (04/21 0808) SpO2:  [95 %-100 %] 98 % (04/21 0454) Weight:  [114 lb 6.7 oz (51.9 kg)-122 lb 2.2 oz (55.4 kg)] 115 lb 4.8 oz (52.3 kg) (04/21 0454) Last BM Date: 01/31/14  Intake/Output from previous day: 04/20 0701 - 04/21 0700 In: -  Out: 2544  Intake/Output this shift:    PE: Skin: sacral wounds are mostly clean.  Minimal amount of scan fibrin tissue Medial right thigh, with stage 2 pressure wound.  Lab Results:   Recent Labs  01/31/14 0930 02/01/14 0549  WBC 13.2* 14.9*  HGB 9.1* 10.3*  HCT 29.8* 33.2*  PLT 327 368   BMET  Recent Labs  01/31/14 0930  NA 136*  K 3.4*  CL 94*  CO2 29  GLUCOSE 155*  BUN 21  CREATININE 3.94*  CALCIUM 9.3   PT/INR No results found for this basename: LABPROT, INR,  in the last 72 hours CMP     Component Value Date/Time   NA 136* 01/31/2014 0930   K 3.4* 01/31/2014 0930   CL 94* 01/31/2014 0930   CO2 29 01/31/2014 0930   GLUCOSE 155* 01/31/2014 0930   BUN 21 01/31/2014 0930   CREATININE 3.94* 01/31/2014 0930   CALCIUM 9.3 01/31/2014 0930   CALCIUM 8.7 07/01/2011 1831   PROT 7.1 01/23/2014 1139   ALBUMIN 1.9* 01/31/2014 0930   AST 32 01/23/2014 1139   ALT 15 01/23/2014 1139   ALKPHOS 134* 01/23/2014 1139   BILITOT 0.4 01/23/2014 1139   GFRNONAA 10* 01/31/2014 0930   GFRAA 12* 01/31/2014 0930   Lipase  No results found for this basename: lipase       Studies/Results: No results found.  Anti-infectives: Anti-infectives   Start     Dose/Rate Route Frequency Ordered Stop   01/31/14 2000  ciprofloxacin (CIPRO) tablet 250 mg     250 mg Oral 2 times daily 01/31/14 1502     01/31/14 1200   vancomycin (VANCOCIN) 500 mg in sodium chloride 0.9 % 100 mL IVPB  Status:  Discontinued     500 mg 100 mL/hr over 60 Minutes Intravenous Every M-W-F (Hemodialysis) 01/26/14 1532 01/27/14 1242   01/27/14 1200  vancomycin (VANCOCIN) 500 mg in sodium chloride 0.9 % 100 mL IVPB  Status:  Discontinued     500 mg 100 mL/hr over 60 Minutes Intravenous Every T-Th-Sa (Hemodialysis) 01/26/14 1532 01/27/14 1242   01/25/14 1800  ciprofloxacin (CIPRO) tablet 500 mg  Status:  Discontinued     500 mg Oral Every 24 hours 01/25/14 1405 01/30/14 1238   01/25/14 1400  vancomycin (VANCOCIN) IVPB 750 mg/150 ml premix  Status:  Discontinued     750 mg 150 mL/hr over 60 Minutes Intravenous Every 48 hours 01/23/14 2032 01/23/14 2112   01/25/14 1200  vancomycin (VANCOCIN) 500 mg in sodium chloride 0.9 % 100 mL IVPB  Status:  Discontinued     500 mg 100 mL/hr over 60 Minutes Intravenous Every T-Th-Sa (Hemodialysis) 01/24/14 1519 01/25/14 1404   01/24/14 1500  cefTRIAXone (ROCEPHIN) 1 g in dextrose 5 % 50 mL IVPB  Status:  Discontinued  1 g 100 mL/hr over 30 Minutes Intravenous Every 24 hours 01/23/14 2011 01/25/14 1404   01/24/14 1200  vancomycin (VANCOCIN) 500 mg in sodium chloride 0.9 % 100 mL IVPB  Status:  Discontinued     500 mg 100 mL/hr over 60 Minutes Intravenous Every M-W-F (Hemodialysis) 01/23/14 2113 01/24/14 1519   01/23/14 1600  vancomycin (VANCOCIN) IVPB 1000 mg/200 mL premix     1,000 mg 200 mL/hr over 60 Minutes Intravenous  Once 01/23/14 1551 01/23/14 1811   01/23/14 1515  cefTRIAXone (ROCEPHIN) 1 g in dextrose 5 % 50 mL IVPB     1 g 100 mL/hr over 30 Minutes Intravenous  Once 01/23/14 1503 01/23/14 1641   01/23/14 1515  vancomycin (VANCOCIN) injection 1,000 mg  Status:  Discontinued     1,000 mg Intravenous  Once 01/23/14 1503 01/23/14 1549       Assessment/Plan  1. Sacral decubitus ulcer, s/p debridement 2. Right medial thigh pressure ulcer  Plan: 1. Cont wound care to sacral wound.   This is mostly clean.   2. Will start routine wound care to right medial thigh as well with santyl and WD dressing changes. 3. The patient should follow up with the wound clinic for further care.   4. No further surgical plans.  She is stable for dc from our standpoint.  We will sign off.   LOS: 9 days    Letha CapeKelly E Lorren Rossetti 02/01/2014, 8:53 AM Pager: [phone]

## 2014-02-01 NOTE — Progress Notes (Signed)
I have seen and examined the patient and agree with the assessment and plans.  Maddalyn Lutze A. Marcey Persad  MD, FACS  

## 2014-02-01 NOTE — Progress Notes (Signed)
Dr. Sunnie Nielsen ordered Temperature to be taken, patient refused

## 2014-02-01 NOTE — Progress Notes (Signed)
Subjective: Pain "all over" but specifically also R hip and bottom area  Filed Vitals:   01/31/14 1705 01/31/14 1953 02/01/14 0454 02/01/14 0808  BP: 121/57 129/56 106/63 103/55  Pulse: 87 86 69 88  Temp:  98.3 F (36.8 C) 98.6 F (37 C)   TempSrc:  Oral Oral   Resp:  18 18   Height:      Weight:   52.3 kg (115 lb 4.8 oz)   SpO2:  97% 98%    Chart review 2009 >> CP/angina, DM poorly controlled, anemia >> underwent heart cath and CABG x 5 for severe CAD 2012 >> severe PVD, L foot infection , had L fempop bypass graft, also HTN, AKI, DM, HL, anemia w transfusion Nov 2014 >> dyspnea w pulm edema, CKD IV, did not require HD but perm access placement was recommended, however pt refused and said she was not sure about dialysis wanted more time to think about it. Diuresed and d/c'd home. Also COPD, OSA, anemia, chronic coumadin for PVD + hx DVT Aug 27, 2013 >> L arm AVF placed as OP procedure Mar- April 2015  >> admitted for ischemic R foot. [facility]VS admitted, chronic limb-threatening ischemia with acute presentation of R toe gangrene. Arteriogram showed nonbypassable disease and R AKA was required. Pt required initiation of dialysis, new ESRD, after dye exposure and baseline severe CKD. Had AVF but developed steal problems in the hospital and the access was ligated, PCath placed for HD. Coumadin stopped, met with pall care made DNR. 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  7 HTN/volume- well below dry wt, BP's soft 8 Enterobacter UTI- on cipro  9 DNR  10 DM2  11 Chronic systolic HF (EF 42%)  12 Dispo- awaiting SNF 13 Ischemic L hand, steal syndrome s/ p AVF ligation Mar 2015    Plan- Decrease BP meds, HD tomorrow    Kelly Splinter MD  pager [phone]    cell [phone]  02/01/2014, 10:33 AM     Recent Labs Lab 01/26/14 0516 01/27/14 2015 01/31/14 0930  NA 137 135* 136*  K 3.8 4.4 3.4*  CL 95* 92* 94*  CO2 28 26 29   GLUCOSE 77 95 155*  BUN 48* 49* 21  CREATININE 5.30* 5.78* 3.94*  CALCIUM 9.9 9.7 9.3  PHOS  --  3.0 2.9    Recent Labs Lab 01/27/14 2015 01/31/14 0930  ALBUMIN 1.9* 1.9*    Recent Labs Lab 01/28/14 0500 01/31/14 0930 02/01/14 0549  WBC 11.0* 13.2* 14.9*  HGB 9.4* 9.1* 10.3*  HCT 30.8* 29.8* 33.2*  MCV 84.8 83.5 83.6  PLT 311 327 368   . amLODipine  10 mg Oral Daily  . atorvastatin  40 mg Oral Daily  . carvedilol  12.5 mg Oral  BID WC  . ciprofloxacin  250 mg Oral BID  . clopidogrel  75 mg Oral Daily  . collagenase   Topical Daily  . darbepoetin (ARANESP) injection - DIALYSIS  100 mcg Intravenous Q Wed-HD  . feeding supplement (NEPRO CARB STEADY)  237 mL Oral TID WC  . fentaNYL  25 mcg Transdermal Q72H  . folic acid  1 mg Oral Daily  . heparin subcutaneous  5,000 Units Subcutaneous 3 times per day  . hydrALAZINE  25 mg Oral BID  . insulin aspart  0-9 Units Subcutaneous TID WC  . isosorbide mononitrate  30 mg Oral Daily  . lidocaine-EPINEPHrine  20 mL Intradermal Once  . mirtazapine  7.5 mg Oral QHS  . multivitamin  1 tablet Oral QHS  . omega-3 acid ethyl esters  1 g Oral QID  . pantoprazole  40 mg Oral Daily  . sodium chloride  3 mL Intravenous Q12H  . vitamin C  500 mg Oral Daily  . zinc sulfate  220 mg Oral Daily     sodium chloride, acetaminophen, acetaminophen, albuterol, alum & mag hydroxide-simeth, diphenhydrAMINE, guaiFENesin-dextromethorphan, morphine injection,  nitroGLYCERIN, ondansetron (ZOFRAN) IV, ondansetron, oxyCODONE, senna-docusate, sodium chloride, traMADol

## 2014-02-01 NOTE — Progress Notes (Signed)
Patient stated she was ok with trying the fentanyl patch first for pain management instead of the IV morphine.

## 2014-02-02 DIAGNOSIS — I10 Essential (primary) hypertension: Secondary | ICD-10-CM

## 2014-02-02 LAB — CBC
HEMATOCRIT: 31.4 % — AB (ref 36.0–46.0)
HEMOGLOBIN: 9.8 g/dL — AB (ref 12.0–15.0)
MCH: 26.1 pg (ref 26.0–34.0)
MCHC: 31.2 g/dL (ref 30.0–36.0)
MCV: 83.7 fL (ref 78.0–100.0)
Platelets: 376 10*3/uL (ref 150–400)
RBC: 3.75 MIL/uL — AB (ref 3.87–5.11)
RDW: 17.6 % — AB (ref 11.5–15.5)
WBC: 14.5 10*3/uL — ABNORMAL HIGH (ref 4.0–10.5)

## 2014-02-02 LAB — RENAL FUNCTION PANEL
ALBUMIN: 2 g/dL — AB (ref 3.5–5.2)
BUN: 28 mg/dL — AB (ref 6–23)
CHLORIDE: 95 meq/L — AB (ref 96–112)
CO2: 26 mEq/L (ref 19–32)
Calcium: 9.5 mg/dL (ref 8.4–10.5)
Creatinine, Ser: 4.09 mg/dL — ABNORMAL HIGH (ref 0.50–1.10)
GFR calc Af Amer: 12 mL/min — ABNORMAL LOW (ref >90)
GFR, EST NON AFRICAN AMERICAN: 10 mL/min — AB (ref >90)
Glucose, Bld: 105 mg/dL — ABNORMAL HIGH (ref 70–99)
PHOSPHORUS: 3.2 mg/dL (ref 2.3–4.6)
POTASSIUM: 3.8 meq/L (ref 3.7–5.3)
SODIUM: 137 meq/L (ref 137–147)

## 2014-02-02 LAB — GLUCOSE, CAPILLARY: GLUCOSE-CAPILLARY: 108 mg/dL — AB (ref 70–99)

## 2014-02-02 MED ORDER — COLLAGENASE 250 UNIT/GM EX OINT: TOPICAL_OINTMENT | Freq: Every day | CUTANEOUS | Status: AC

## 2014-02-02 MED ORDER — OXYCODONE-ACETAMINOPHEN 10-325 MG PO TABS: 1.0000 | ORAL_TABLET | Freq: Three times a day (TID) | ORAL | Status: DC | PRN

## 2014-02-02 MED ORDER — CLOPIDOGREL BISULFATE 75 MG PO TABS: 75.0000 mg | ORAL_TABLET | Freq: Every day | ORAL | Status: AC

## 2014-02-02 MED ORDER — ALTEPLASE 2 MG IJ SOLR: 2.0000 mg | Freq: Once | INTRAMUSCULAR | Status: DC | PRN

## 2014-02-02 MED ORDER — MORPHINE SULFATE 2 MG/ML IJ SOLN
INTRAMUSCULAR | Status: AC
  Filled 2014-02-02: qty 1

## 2014-02-02 MED ORDER — PENTAFLUOROPROP-TETRAFLUOROETH EX AERO: 1.0000 "application " | INHALATION_SPRAY | CUTANEOUS | Status: DC | PRN

## 2014-02-02 MED ORDER — HEPARIN SODIUM (PORCINE) 1000 UNIT/ML DIALYSIS: 1000.0000 [IU] | INTRAMUSCULAR | Status: DC | PRN

## 2014-02-02 MED ORDER — OXYCODONE HCL 5 MG PO TABS
ORAL_TABLET | ORAL | Status: AC
  Administered 2014-02-02: 10 mg via ORAL
  Filled 2014-02-02: qty 2

## 2014-02-02 MED ORDER — SODIUM CHLORIDE 0.9 % IV SOLN: 100.0000 mL | INTRAVENOUS | Status: DC | PRN

## 2014-02-02 MED ORDER — LIDOCAINE-PRILOCAINE 2.5-2.5 % EX CREA: 1.0000 "application " | TOPICAL_CREAM | CUTANEOUS | Status: DC | PRN

## 2014-02-02 MED ORDER — NEPRO/CARBSTEADY PO LIQD: 237.0000 mL | ORAL | Status: DC | PRN

## 2014-02-02 MED ORDER — CIPROFLOXACIN HCL 250 MG PO TABS: 250.0000 mg | ORAL_TABLET | Freq: Two times a day (BID) | ORAL | Status: DC

## 2014-02-02 MED ORDER — FENTANYL 25 MCG/HR TD PT72: 25.0000 ug | MEDICATED_PATCH | TRANSDERMAL | Status: DC

## 2014-02-02 MED ORDER — LIDOCAINE HCL (PF) 1 % IJ SOLN: 5.0000 mL | INTRAMUSCULAR | Status: DC | PRN

## 2014-02-02 MED ORDER — HEPARIN SODIUM (PORCINE) 1000 UNIT/ML DIALYSIS: 1800.0000 [IU] | Freq: Once | INTRAMUSCULAR | Status: DC

## 2014-02-02 NOTE — Progress Notes (Signed)
Subjective: Pain "all over" but specifically also R hip and bottom area  Filed Vitals:   02/02/14 0730 02/02/14 0800 02/02/14 0830 02/02/14 0900  BP: 130/68 123/98 113/70 128/58  Pulse: 77 80  80  Temp:      TempSrc:      Resp:      Height:      Weight:      SpO2:       Chart review 2009 >> CP/angina, DM poorly controlled, anemia >> underwent heart cath and CABG x 5 for severe CAD 2012 >> severe PVD, L foot infection , had L fempop bypass graft, also HTN, AKI, DM, HL, anemia w transfusion Nov 2014 >> dyspnea w pulm edema, CKD IV, did not require HD but perm access placement was recommended, however pt refused and said she was not sure about dialysis wanted more time to think about it. Diuresed and d/c'd home. Also COPD, OSA, anemia, chronic coumadin for PVD + hx DVT Aug 27, 2013 >> L arm AVF placed as OP procedure Mar- April 2015  >> admitted for ischemic R foot. VVS admitted, chronic limb-threatening ischemia with acute presentation of R toe gangrene. Arteriogram showed nonbypassable disease and R AKA was required. Pt required initiation of dialysis, new ESRD, after dye exposure and baseline severe CKD. Had AVF but developed steal problems in the hospital and the access was ligated, PCath placed for HD. Coumadin stopped, met with pall care made DNR. D/c'd to home (?)   Exam: Alert, frail elderly female in no distress No jvd Chest clear bilat  RRR no MRG Abd soft, NTND  RLE AKA, no LE edema R lat thigh bruising, tender  Neuro is nonfocal, gen weakness, ox3  R IJ Diatek cath   Dialysis: MWF [facility]  4h   57kg   2/2.25 [place]   400/A1.5   Heparin 1800   R IJ Cath  Hectorol 2 EPO 6000 Venofer 50/wk   Assessment:  1 Chest pain- resolved, not candidate for procedures per cardiology  2 Visual loss- diab retinopathy, for OP referral 3 Pain- still not under control 4 Sacral decub stg II-III  5 ESRD started HD in March; has been accepted at [facility] on [address] 6 Anemia on  aranesp  7 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  8 HTN/volume- well below dry wt, BP's soft 8 Enterobacter UTI- on cipro  9 DNR  10 DM2  11 Chronic systolic HF (EF 35%) 12 Ischemic L hand, steal syndrome s/ p AVF ligation Mar 2015 13 Dispo- to SNF  Plan - HD today, ok for d/c to SNF, may need to increase pain meds (duragesic patch to 50?Kelly Splinter MD  pager [phone]    cell [phone]  02/02/2014, 9:18 AM     Recent Labs Lab 01/27/14 2015 01/31/14 0930 02/02/14 0724  NA 135* 136* 137  K 4.4 3.4* 3.8  CL 92* 94* 95*  CO2 _0 GLUCOSE 95 155* 105*  BUN 49* 21 28*  CREATININE 5.78* 3.94* 4.09*  CALCIUM 9.7 9.3 9.5  PHOS 3.0 2.9 3.2    Recent Labs Lab 01/27/14 2015 01/31/14 0930 02/02/14 0724  ALBUMIN 1.9* 1.9* 2.0*    Recent Labs Lab 01/31/14 0930 02/01/14 0549 02/02/14 0500  WBC 13.2* 14.9* 14.5*  HGB 9.1* 10.3* 9.8*  HCT 29.8* 33.2* 31.4*  MCV 83.5 83.6 83.7  PLT 327 368 376   . atorvastatin  40 mg Oral Daily  . carvedilol  12.5 mg Oral BID WC  . ciprofloxacin  250 mg Oral BID  . clopidogrel  75 mg Oral Daily  . collagenase   Topical Daily  . darbepoetin (ARANESP) injection - DIALYSIS  100 mcg Intravenous Q Wed-HD  . feeding supplement (NEPRO CARB STEADY)  237 mL Oral TID WC  . fentaNYL  25 mcg Transdermal Q72H  . folic acid  1 mg Oral Daily  . [START ON 02/03/2014] heparin  1,800 Units Dialysis Once in dialysis  . insulin aspart  0-9 Units Subcutaneous TID WC  . isosorbide mononitrate  30 mg Oral Daily  . lidocaine-EPINEPHrine  20 mL Intradermal Once  . mirtazapine  7.5 mg Oral QHS  . morphine      . multivitamin  1 tablet Oral QHS  . omega-3 acid ethyl esters  1 g Oral QID  . pantoprazole  40 mg Oral Daily  . sodium chloride  3 mL Intravenous Q12H  . vitamin C  500 mg Oral Daily  . zinc sulfate  220 mg Oral Daily     sodium chloride, sodium chloride, sodium chloride, acetaminophen, acetaminophen, albuterol, alteplase, alum & mag hydroxide-simeth,  diphenhydrAMINE, feeding supplement (NEPRO CARB STEADY), guaiFENesin-dextromethorphan, heparin, lidocaine (PF), lidocaine-prilocaine, morphine injection, nitroGLYCERIN, ondansetron (ZOFRAN) IV, ondansetron, oxyCODONE, pentafluoroprop-tetrafluoroeth, senna-docusate, sodium chloride, traMADol   

## 2014-02-02 NOTE — Progress Notes (Signed)
CSW confirmed with [facility] that they can accept patient. Patient has been clipped at [facility] on [facility].  CSW contacted the daughter to inform her of the discharge.  Patient's daughter had multiple medical questions. CSW informed MD.  Sabino Niemann, MSW, LCSWA [phone]

## 2014-02-02 NOTE — Progress Notes (Signed)
PT Cancellation Note  Patient Details Name: KHRISTIAN ARTIGA MRN: [id number] DOB: 08-11-1941   Cancelled Treatment:    Reason Eval/Treat Not Completed: Patient at procedure or test/unavailable, pt in HD this morning and then hydrotherapy this afternoon. Will check back tomorrow.   [place] L Jawad Wiacek 02/02/2014, 1:23 PM

## 2014-02-02 NOTE — Progress Notes (Signed)
Hydrotherapy Note  Attempted hydro x2 this pm with pt initially agreeing, then after only a few minutes refusing hydro.  Attempted to explain to pt purpose for hydro and RN attempted to emphasize importance.  Pt initially agreed, then after only a few minutes forgets what RN said and adamantly refusing.  Attempted to re-orient pt, but unsuccessful.  If pt does not D/C to SNF today will f/u tomorrow.    [address] [place], [place] [phone]

## 2014-02-02 NOTE — Discharge Summary (Signed)
Physician Discharge Summary  [name] SUP:[id number] DOB: 03-Sep-1941 DOA: 01/23/2014  PCP: Dwan Bolt, MD  Admit date: 01/23/2014 Discharge date: 02/02/2014  Time spent: 60 minutes  Recommendations for Outpatient Follow-up:  1. Followup with nursing home MD. 2. Continuous until and wet-to-dry dressing changes, continue hydrotherapy in the nursing home. 3. Continue with the ciprofloxacin until 02/05/2014 to complete 14 days of antibiotics. 4. Followup in 2-3 weeks with Dr. Zadie Rhine (ophthalmology).  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 female with a Past Medical History end-stage renal disease recently started on dialysis, history of peripheral vascular disease status post recent right AKA, diabetes, history o fcoronary artery disease status post CABG in 2009, and a recent positive nuclear stress test on medical management, hypertension, chronic systolic heart failure who presented to the hospital today from a skilled nursing facility for evaluation of the above noted  complaint. During her recent hospitalization (discharge on 01/15/14) patient underwent a right AKA and also was started on hemodialysis, she previously was on chronic Coumadin therapy and Coumadin was also stopped at discharge.  She comes to the emergency room today complaining of retrosternal chest pain. Please note during my interview the patient, she was somewhat drowsy and had just received narcotics. However she claims that the chest pain was located in the center of her chest, was burning at times and at times sharp. This is associated with a lot of belching. She claims that this was also associated with mild shortness of breath. Apparently, this pain lasted for around 10-15 minutes. During my interview, she she was chest pain-free. Per nursing staff, and daughter, no further recurrence of chest pain.  During my interview, patient claimed that she was not able to see from both eyes for since this past Wednesday. Per daughter, [name] who is at bedside, apparently doing last admission she had had a few episodes of transient visual loss. However the patient's daughter also complains that the past 5 days she has lost eyesight in both eyes. This is painless visual loss.    Hospital Course:   I took over the care of the patient for the only for the day of discharge. According to the notes as well as recommendation from case management and social work patient is ready for discharge to skilled nursing facility.  This is a discharge summary on [name] by a for her hospital stay from 01/23/2014 to 02/02/2014. The hospital stay was rather prolonged and complicated. Patient admitted to the hospital because of chest pain, she had recent  right AKA, extensive medical history including peripheral vascular disease and CKD stage V which was been recently started on dialysis. Patient also has decubitus ulcers. Seen by general surgery was in the hospital. Patient has bilateral vision loss and she was evaluated by  ophthalmology before by Dr. Zadie Rhine and Dr. Posey Pronto. They both recommended to followup as outpatient in 2-3 weeks. Have been asked earlier today by the social worker to call the patient's daughter are at the patient's daughter asked me not to do anything until she, in talk to me face-to-face. When I entered the room the patient's daughter [name] asked the nurse to leave the room. Patient's daughter asked me to "document everything from head to toe" and she was also saying that "you don't need to the involved this". The patient's daughter asked me to take pictures of patients sacral decubitus ulcer, left little finger dry gangrene and right thigh bruises, documented below. She was video taping using her cell phone while I was taking pictures of her mother.   Chest pain  -Denies further pain. Hx of significant CAD. troponins are negative. Cardiology evaluated the pt and recommended not a candidate cardiac cath and she is not a candidate for CABG. she has high bleeding risk on dual antiplatelet therapy following PCI. Also has severe cardiomyopathy but is not a candidate for AICD Given co morbidities and poor long term prognosis..  -Recommend optimal medical management. Continue Imdur and Coreg. Patient has allergy to aspirin. Started on plavix.  -VQ scan with low probability for PE.  -Chest pain has resolved.   Bilateral visual loss  Painless visual loss, has been going on for at least for the past 5 days. Seen by opthalmology Dr Posey Pronto and suggests findings of background proliferative retinopathy. opthalmology will consult retina specialist to evaluate. Her.CT head unremarkable. ESR and CRP mildly elevated.  -needs tighter blood glucose control.  -Dr. Deloria Lair, a retina specialist. Has agreed to see [name] per Dr Posey Pronto note.  -Dr Zadie Rhine recommend out patient follow up in 2 to 3 weeks for further care.   UTI (lower urinary tract infection)  - urine cultures.( >100k colonies), Enterobacter.   - Sensitive to ciprofloxacin.  -Continue with ciprofloxacin continue ciprofloxacin until 02/05/2014 to complete 14 days.  Sacral decubitus stage II-III  Minimal purulent discharge. ESR mildly elevated.  Appreciate wound care evaluation. On santyl dressing  Patient S/P debridement by surgery 4-15.  No need for antibiotics.  Vancomycin discontinue 4-16.  Dressing changes BID.  Surgery recommending hydrotherapy.   Pain control Patient and daughter relates pain is worse, started on fentanyl patch and oxycodone. The patient and her daughter indicated about the narcotics pain medications.  Left little finger dry gangrene  -History of left hand steal syndrome.  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 No sign of infection.   Anemia  - Secondary to end-stage renal disease.  - Monitor hemoglobin and hematocrit, erythropoietin per nephrology.   ESRD on dialysis  Renal following. HD per schedule.. Continue hectoral and Fosrenol with meals.  Patient now wants to continue with dialysis.   DM type 2, uncontrolled, with renal complications  -Continue Lantus. 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  -Needs tighter blood glucose control. A1C of 8.1 and has elevated fsg. continue SSI.  -Hypoglycemia. Lantus discontinued discharged to the nursing home on sliding scale, restart Lantus when appropriate. -Lantus discontinued in the hospital because of hypoglycemic episodes, fasting CBGs were 105-120 for the last 2 days.  Multifocal atrial tachycardia  Currently stable on telemetry.   Chronic systolic heart failure  EF of 30-35% on recent echo. Appears compensated. Continue beta blocker, indoor and hydralazine.   Essential hypertension  - Stable, continue amlodipine, Coreg. Added hydralazine. Dose of Imdur increased. BP stable.   COPD (chronic obstructive pulmonary disease)  nebulized bronchodilators as needed   PVD (peripheral vascular disease)-status post recent right AKA  - Staples in  place, no discharge erythema at the stump site.  -will Notify vascular for staples removal.   History of prior VTE  - Recently taken off anticoagulation, she was on Coumadin..  - Review of past medical history shows a history of peroneal vein DVT, however a recent lower extremity Doppler was negative.  -Since VQ scan is negative, Doppler of lower extremity was not repeated. But denies any pain.   Small Hematoma thigh; pain might need to hold heparin. Discussed with daughter and Dr Jonnie Finner.    Procedures:  Right above-knee hydrotherapy.  Consultations:  General surgery.  Vascular surgery.  Nephrology  Discharge Exam: Filed Vitals:   02/02/14 1037  BP: 131/68  Pulse: 84  Temp: 97.7 F (36.5 C)  Resp: 22   General: Alert and awake, oriented x3, not in any acute distress. HEENT: anicteric sclera, pupils reactive to light and accommodation, EOMI CVS: S1-S2 clear, no murmur rubs or gallops Chest: clear to auscultation bilaterally, no wheezing, rales or rhonchi Abdomen: soft nontender, nondistended, normal bowel sounds, no organomegaly Extremities: Right-sided AKA Neuro: Cranial nerves II-XII intact, no focal neurological deficits, gait not tested.  This is a picture of sacrodecubitus ulcer    Bruises on the right thigh  Left hand showing left little finger dry gangrene       Future Appointments Provider Department Dept Phone   02/15/2014 8:30 AM Mal Misty, MD [facility] [phone]       Medication List    STOP taking these medications       insulin glargine 100 UNIT/ML injection  Commonly known as:  LANTUS      TAKE these medications       acetaminophen 650 MG CR tablet  Commonly known as:  TYLENOL  Take 650 mg by mouth every 8 (eight) hours as needed for pain.     amLODipine 5 MG tablet  Commonly known as:  NORVASC  Take 10 mg by mouth daily.     atorvastatin 40 MG tablet  Commonly known as:  LIPITOR  Take 40  mg by mouth daily.     calcitRIOL 0.25 MCG capsule  Commonly known as:  ROCALTROL  Take 0.5 mcg by mouth daily.     carvedilol 12.5 MG tablet  Commonly known as:  COREG  Take 12.5 mg by mouth 2 (two) times daily with a meal.     ciprofloxacin 250 MG tablet  Commonly known as:  CIPRO  Take 1 tablet (250 mg total) by mouth 2 (two) times daily.     clopidogrel 75 MG tablet  Commonly known as:  PLAVIX  Take 1 tablet (75 mg total) by mouth daily.     collagenase ointment  Commonly known as:  SANTYL  Apply topically daily.     diphenhydrAMINE 25 MG tablet  Commonly known as:  BENADRYL  Take 25 mg by mouth every 6 (six) hours as needed for itching.     feeding supplement (NEPRO CARB STEADY) Liqd  Take 237 mLs by mouth 3 (three) times daily with meals.     fentaNYL  25 MCG/HR patch  Commonly known as:  [place] - dosed mcg/hr  Place 1 patch (25 mcg total) onto the skin every 3 (three) days.     ferrous sulfate 325 (65 FE) MG tablet  Take 325 mg by mouth 2 (two) times daily.     folic acid 1 MG tablet  Commonly known as:  FOLVITE  Take 1 mg by mouth daily.     isosorbide mononitrate 30 MG 24 hr tablet  Commonly known as:  IMDUR  Take 15 mg by mouth daily.     lanthanum 500 MG chewable tablet  Commonly known as:  FOSRENOL  Chew 1 tablet (500 mg total) by mouth 3 (three) times daily with meals.     mirtazapine 7.5 MG tablet  Commonly known as:  REMERON  Take 7.5 mg by mouth at bedtime.     nitroGLYCERIN 0.4 MG SL tablet  Commonly known as:  NITROSTAT  Place 0.4 mg under the tongue every 5 (five) minutes as needed for chest pain.     omega-3 acid ethyl esters 1 G capsule  Commonly known as:  LOVAZA  Take 1 g by mouth 4 (four) times daily.     omeprazole 20 MG capsule  Commonly known as:  PRILOSEC  Take 20 mg by mouth daily.     oxyCODONE-acetaminophen 10-325 MG per tablet  Commonly known as:  PERCOCET  Take 1 tablet by mouth 3 (three) times daily as needed for  pain.     potassium chloride SA 20 MEQ tablet  Commonly known as:  K-DUR,KLOR-CON  Take 20 mEq by mouth daily.     vitamin C 500 MG tablet  Commonly known as:  ASCORBIC ACID  Take 500 mg by mouth daily.     zinc sulfate 220 MG capsule  Take 220 mg by mouth daily.       Allergies  Allergen Reactions  . Ivp Dye [Iodinated Diagnostic Agents] Hives and Rash  . Aspirin Hives       Follow-up Information   Follow up with [facility]              In 2 weeks.   Contact information:   [address] [place] [address] [phone]       The results of significant diagnostics from this hospitalization (including imaging, microbiology, ancillary and laboratory) are listed below for reference.    Significant Diagnostic Studies: Ct Head Wo Contrast  01/23/2014   CLINICAL DATA:  Intermittent blindness, altered mental status  EXAM: CT HEAD WITHOUT CONTRAST  TECHNIQUE: Contiguous axial images were obtained from the base of the skull through the vertex without intravenous contrast.  COMPARISON:  MR HEAD W/O CM dated 01/06/2014; CT HEAD W/O CM dated 01/05/2014  FINDINGS: Calvarium is intact. There is moderate diffuse atrophy and low attenuation in the deep white matter, stable findings consistent with chronic involutional change. There is no evidence of infarct, hemorrhage, or extra-axial fluid.  IMPRESSION: No acute findings.  Chronic involutional change stable.   Electronically Signed   By: Skipper Cliche M.D.   On: 01/23/2014 19:35   Ct Head Wo Contrast  01/05/2014   CLINICAL DATA:  Decreased mental status.  Left hand numbness.  EXAM: CT HEAD WITHOUT CONTRAST  TECHNIQUE: Contiguous axial images were obtained from the base of the skull through the vertex without intravenous contrast.  COMPARISON:  MR HEAD W/O CM dated 01/03/2014; DG ANG/EXT/UNI/OR*L* dated 06/26/2011  FINDINGS: There  is no evidence of acute intracranial hemorrhage, mass lesion,  brain edema or extra-axial fluid collection. The ventricles and subarachnoid spaces are appropriately sized for age. There is no CT evidence of acute cortical infarction. Mild periventricular white matter disease appears stable. There diffuse intracranial vascular calcifications.  The visualized paranasal sinuses, mastoid air cells and middle ears are clear. The calvarium is intact.  IMPRESSION: Stable appearance of the brain.  No acute intracranial findings.   Electronically Signed   By: Camie Patience M.D.   On: 01/05/2014 12:58   Mr Brain Wo Contrast  01/06/2014   CLINICAL DATA:  Evaluate for stroke. Decreased mental status. Left hand numbness.  EXAM: MRI HEAD WITHOUT CONTRAST  TECHNIQUE: Multiplanar, multiecho pulse sequences of the brain and surrounding structures were obtained without intravenous contrast.  COMPARISON:  CT HEAD W/O CM dated 01/05/2014; MR HEAD W/O CM dated 01/03/2014  FINDINGS: No evidence for acute infarction, hemorrhage, mass lesion, hydrocephalus, or extra-axial fluid. Unchanged atrophy with small vessel disease.  No changes suggestive of posterior reversible encephalopathy syndrome.  Post infusion, no abnormal enhancement of the brain or meninges. Low T1 signal intensity bone marrow due to renal disease. Mild pannus. No acute sinus, orbital, or mastoid disease.  IMPRESSION: No acute stroke or changes suggestive of hypertensive encephalopathy.  Atrophy and small vessel disease, stable.   Electronically Signed   By: Rolla Flatten M.D.   On: 01/06/2014 21:17   Mr Brain Wo Contrast  01/03/2014   CLINICAL DATA:  Left arm numbness. Evaluate for stroke versus related to recent surgical procedure.  EXAM: MRI HEAD WITHOUT CONTRAST  TECHNIQUE: Multiplanar, multiecho pulse sequences of the brain and surrounding structures were obtained without intravenous contrast.  COMPARISON:  None.  FINDINGS: The patient was unable to remain motionless for the exam. Small or subtle lesions could be overlooked.   No acute stroke is evident. There is generalized atrophy with moderate chronic microvascular ischemic change. Remote right pontine and right basal ganglia lacunes are observed. Diffuse marrow signal abnormality consistent with chronic anemia/chronic renal disease. Cervical spondylosis. Left C1-C2 joint effusion, likely degenerative. Mild pannus.  IMPRESSION: Atrophy.  Chronic microvascular ischemic change.  Remote areas of ischemia without visible acute infarction. No underlying abnormality is seen which might contribute to left arm numbness.   Electronically Signed   By: Rolla Flatten M.D.   On: 01/03/2014 17:57   Nm Pulmonary Perf And Vent  01/23/2014   CLINICAL DATA:  Chest pain, shortness of breath, evaluate for pulmonary embolism  EXAM: NUCLEAR MEDICINE VENTILATION - PERFUSION LUNG SCAN  TECHNIQUE: Ventilation images were obtained in multiple projections using inhaled aerosol technetium 99 M DTPA. Perfusion images were obtained in multiple projections after intravenous injection of Tc-36mMAA.  RADIOPHARMACEUTICALS:  40 mCi Tc-948mTPA aerosol and 6 mCi Tc-9947mA  COMPARISON:  DG CHEST 1V PORT dated 01/23/2014; DG CHEST 2 VIEW dated 12/22/2013; DG CHEST 2V dated 08/27/2013  FINDINGS: Review of chest radiograph performed earlier same day demonstrates grossly unchanged enlarged cardiac silhouette and mediastinal contours. Post median sternotomy and CABG. Interval placement of right internal jugular approach dialysis catheter. The lungs are hyperexpanded with mild diffuse thickening of the pulmonary interstitium. No pleural effusion or pneumothorax. Mild pulmonary venous congestion without frank evidence of edema.  Ventilation: There is mild diffuse mottled ventilation of the bilateral pulmonary parenchyma. There is clumping of inhaled radiotracer about the bilateral pulmonary hila, right greater than left. There is ingested radiotracer seen within the oropharynx, hypopharynx and stomach.  Perfusion: There is  relative homogeneous distribution of injected radiotracer without discrete segmental or subsegmental mismatched filling defect to suggest pulmonary embolism. A minimal amount of extravasated radiotracer is seen about a right upper extremity IV site.  IMPRESSION: Pulmonary embolism absent (very low probability of pulmonary embolism).   Electronically Signed   By: Sandi Mariscal M.D.   On: 01/23/2014 23:15   Dg Chest Portable 1 View  01/23/2014   CLINICAL DATA:  Shortness of breath  EXAM: PORTABLE CHEST - 1 VIEW  COMPARISON:  DG CHEST 1V PORT dated 01/09/2014  FINDINGS: Borderline cardiomegaly noted with evidence of CABG. Right IJ approach dual lumen dialysis catheter tip terminates over the distal SVC at possible high right atrium. No focal pulmonary consolidation is identified. Diffusely prominent reticular markings are reidentified with improved aeration since previously.  IMPRESSION: No focal acute cardiopulmonary process.  Cardiomegaly with stable prominence of diffuse reticular markings, nonspecific.   Electronically Signed   By: Conchita Paris M.D.   On: 01/23/2014 13:58   Dg Chest Port 1 View  01/09/2014   CLINICAL DATA:  Central line placement  EXAM: PORTABLE CHEST - 1 VIEW  COMPARISON:  01/01/2014  FINDINGS: Dual-lumen central line, tip at the level of the upper right atrium. No evidence of pneumothorax.  Unchanged cardiopericardial enlargement. Status post CABG. Worsening diffuse interstitial opacity. There are small layering pleural effusions, right more than left.  IMPRESSION: 1. Right IJ central line with the lowest catheter tip in the upper right atrium. No pneumothorax. 2. Worsening CHF.   Electronically Signed   By: Jorje Guild M.D.   On: 01/09/2014 02:43   Dg Fluoro Guide Cv Line-no Report  01/09/2014   CLINICAL DATA: check for diatek placement   FLOURO GUIDE CV LINE  Fluoroscopy was utilized by the requesting physician.  No radiographic  interpretation.     Microbiology: Recent Results  (from the past 240 hour(s))  URINE CULTURE     Status: None   Collection Time    01/23/14  1:34 PM      Result Value Ref Range Status   Specimen Description URINE, CLEAN CATCH   Final   Special Requests ADD [id number]   Final   Culture  Setup Time     Final   Value: 01/23/2014 17:34     Performed at [place]     Final   Value: >=100,000 COLONIES/ML     Performed at [facility]   Culture     Final   Value: ENTEROBACTER AEROGENES     Performed at [facility]   Report Status 01/25/2014 FINAL   Final   Organism ID, Bacteria ENTEROBACTER AEROGENES   Final  MRSA PCR SCREENING     Status: None   Collection Time    01/23/14  9:16 PM      Result Value Ref Range Status   MRSA by PCR NEGATIVE  NEGATIVE Final   Comment:            The GeneXpert MRSA Assay (FDA     approved for NASAL specimens     only), is one component of a     comprehensive MRSA colonization     surveillance program. It is not     intended to diagnose MRSA     infection nor to guide or     monitor treatment for     MRSA infections.     Labs: Basic Metabolic Panel:  Recent Labs Lab 01/27/14 2015 01/31/14 0930 02/02/14 0724  NA 135* 136* 137  K 4.4 3.4* 3.8  CL 92* 94* 95*  CO2 26 29 26   GLUCOSE 95 155* 105*  BUN 49* 21 28*  CREATININE 5.78* 3.94* 4.09*  CALCIUM 9.7 9.3 9.5  PHOS 3.0 2.9 3.2   Liver Function Tests:  Recent Labs Lab 01/27/14 2015 01/31/14 0930 02/02/14 0724  ALBUMIN 1.9* 1.9* 2.0*   No results found for this basename: LIPASE, AMYLASE,  in the last 168 hours No results found for this basename: AMMONIA,  in the last 168 hours CBC:  Recent Labs Lab 01/27/14 2014 01/28/14 0500 01/31/14 0930 02/01/14 0549 02/02/14 0500  WBC 12.5* 11.0* 13.2* 14.9* 14.5*  HGB 9.6* 9.4* 9.1* 10.3* 9.8*  HCT 30.7* 30.8* 29.8* 33.2* 31.4*  MCV 83.4 84.8 83.5 83.6 83.7  PLT 372 311 327 368 376   Cardiac Enzymes: No results found for this basename:  CKTOTAL, CKMB, CKMBINDEX, TROPONINI,  in the last 168 hours BNP: BNP (last 3 results)  Recent Labs  08/17/13 1603 12/22/13 1809 01/23/14 1140  PROBNP 28521.0* >70000.0* >70000.0*   CBG:  Recent Labs Lab 02/01/14 0748 02/01/14 1140 02/01/14 1645 02/01/14 2041 02/02/14 1129  GLUCAP 155* 123* 187* 126* 108*       Signed:  Trevontae Lindahl  [facility] 02/02/2014, 1:01 PM

## 2014-02-02 NOTE — Progress Notes (Signed)
02/02/2014 9:01 AM Hemodialysis Outpatient Note; this patient has been accepted at the [facility] ) on a Tuesday, Thursday and Saturday 2nd shift schedule. The patient can begin treatment on Thursday April 23,2015 and needs to arrive at the center for 11:30 AM to sign paperwork and consents. Thank you. Tilman Neat

## 2014-02-02 NOTE — Progress Notes (Signed)
CSW attempted to contact daughter 3x to inform her that transportation has been called for her mother. Patient's daughter is not answering her phone.   Clinical social worker assisted with patient discharge to skilled nursing facility,.  CSW addressed all family questions and concerns at 11:30 Am. CSW copied chart and added all important documents. CSW also set up patient transportation with [employer]. Clinical Social Worker will sign off for now as social work intervention is no longer needed.   Sabino Niemann, MSW, [employer] [phone]

## 2014-02-02 NOTE — Discharge Instructions (Signed)
Apply santyl to buttock wound and right medial thigh wound daily.  Change dressing twice a day to both wounds  Dressing Change A dressing is a material placed over wounds. It keeps the wound clean, dry, and protected from further injury. This provides an environment that favors wound healing.  BEFORE YOU BEGIN  Get your supplies together. Things you may need include:  Saline solution.  Flexible gauze dressing.  Medicated cream.  Tape.  Gloves.  Abdominal dressing pads.  Gauze squares.  Plastic bags.  Take pain medicine 30 minutes before the dressing change if you need it.  Take a shower before you do the first dressing change of the day. Use plastic wrap or a plastic bag to prevent the dressing from getting wet. REMOVING YOUR OLD DRESSING   Wash your hands with soap and water. Dry your hands with a clean towel.  Put on your gloves.  Remove any tape.  Carefully remove the old dressing. If the dressing sticks, you may dampen it with warm water to loosen it, or follow your caregiver's specific directions.  Remove any gauze or packing tape that is in your wound.  Take off your gloves.  Put the gloves, tape, gauze, or any packing tape into a plastic bag. CHANGING YOUR DRESSING  Open the supplies.  Take the cap off the saline solution.  Open the gauze package so that the gauze remains on the inside of the package.  Put on your gloves.  Clean your wound as told by your caregiver.  If you have been told to keep your wound dry, follow those instructions.  Your caregiver may tell you to do one or more of the following:  Pick up the gauze. Pour the saline solution over the gauze. Squeeze out the extra saline solution.  Put medicated cream or other medicine on your wound if you have been told to do so.  Put the solution soaked gauze only in your wound, not on the skin around it.  Pack your wound loosely or as told by your caregiver.  Put dry gauze on your  wound.  Put abdominal dressing pads over the dry gauze if your wet gauze soaks through.  Tape the abdominal dressing pads in place so they will not fall off. Do not wrap the tape completely around the affected part (arm, leg, abdomen).  Wrap the dressing pads with a flexible gauze dressing to secure it in place.  Take off your gloves. Put them in the plastic bag with the old dressing. Tie the bag shut and throw it away.  Keep the dressing clean and dry until your next dressing change.  Wash your hands. SEEK MEDICAL CARE IF:  Your skin around the wound looks red.  Your wound feels more tender or sore.  You see pus in the wound.  Your wound smells bad.  You have a fever.  Your skin around the wound has a rash that itches and burns.  You see black or yellow skin in your wound that was not there before.  You feel nauseous, throw up, and feel very tired. Document Released: 11/07/2004 Document Revised: 12/23/2011 Document Reviewed: 08/12/2011 [facility] Patient Information 2014 [place], [place].   Diets for Diabetes, Food Labeling Look at food labels to help you decide how much of a product you can eat. You will want to check the amount of total carbohydrate in a serving to see how the food fits into your meal plan. In the list of ingredients, the ingredient present  in the largest amount by weight must be listed first, followed by the other ingredients in descending order. STANDARD OF IDENTITY Most products have a list of ingredients. However, foods that the Food and Drug Administration (FDA) has given a standard of identity do not need a list of ingredients. A standard of identity means that a food must contain certain ingredients if it is called a particular name. Examples are mayonnaise, peanut butter, ketchup, jelly, and cheese. LABELING TERMS There are many terms found on food labels. Some of these terms have specific definitions. Some terms are regulated by the FDA, and the FDA  has clearly specified how they can be used. Others are not regulated or well-defined and can be misleading and confusing. SPECIFICALLY DEFINED TERMS Nutritive Sweetener.  A sweetener that contains calories,such as table sugar or honey. Nonnutritive Sweetener.  A sweetener with few or no calories,such as saccharin, aspartame, sucralose, and cyclamate. LABELING TERMS REGULATED BY THE FDA Free.  The product contains only a tiny or small amount of fat, cholesterol, sodium, sugar, or calories. For example, a "fat-free" product will contain less than 0.5 g of fat per serving. Low.  A food described as "low" in fat, saturated fat, cholesterol, sodium, or calories could be eaten fairly often without exceeding dietary guidelines. For example, "low in fat" means no more than 3 g of fat per serving. Lean.  "Lean" and "extra lean" are U.S. Department of Agriculture Architect) terms for use on meat and poultry products. "Lean" means the product contains less than 10 g of fat, 4 g of saturated fat, and 95 mg of cholesterol per serving. "Lean" is not as low in fat as a product labeled "low." Extra Lean.  "Extra lean" means the product contains less than 5 g of fat, 2 g of saturated fat, and 95 mg of cholesterol per serving. While "extra lean" has less fat than "lean," it is still higher in fat than a product labeled "low." Reduced, Less, Fewer.  A diet product that contains 25% less of a nutrient or calories than the regular version. For example, hot dogs might be labeled "25% less fat than our regular hot dogs." Light/Lite.  A diet product that contains  fewer calories or  the fat of the original. For example, "light in sodium" means a product with  the usual sodium. More.  One serving contains at least 10% more of the daily value of a vitamin, mineral, or fiber than usual. Good Source Of.  One serving contains 10% to 19% of the daily value for a particular vitamin, mineral, or fiber. Excellent  Source Of.  One serving contains 20% or more of the daily value for a particular nutrient. Other terms used might be "high in" or "rich in." Enriched or Fortified.  The product contains added vitamins, minerals, or protein. Nutrition labeling must be used on enriched or fortified foods. Imitation.  The product has been altered so that it is lower in protein, vitamins, or minerals than the usual food,such as imitation peanut butter. Total Fat.  The number listed is the total of all fat found in a serving of the product. Under total fat, food labels must list saturated fat and trans fat, which are associated with raising bad cholesterol and an increased risk of heart blood vessel disease. Saturated Fat.  Mainly fats from animal-based sources. Some examples are red meat, cheese, cream, whole milk, and coconut oil. Trans Fat.  Found in some fried snack foods, packaged foods, and fried  restaurant foods. It is recommended you eat as close to 0 g of trans fat as possible, since it raises bad cholesterol and lowers good cholesterol. Polyunsaturated and Monounsaturated Fats.  More healthful fats. These fats are from plant sources. Total Carbohydrate.  The number of carbohydrate grams in a serving of the product. Under total carbohydrate are listed the other carbohydrate sources, such as dietary fiber and sugars. Dietary Fiber.  A carbohydrate from plant sources. Sugars.  Sugars listed on the label contain all naturally occurring sugars as well as added sugars. LABELING TERMS NOT REGULATED BY THE FDA Sugarless.  Table sugar (sucrose) has not been added. However, the manufacturer may use another form of sugar in place of sucrose to sweeten the product. For example, sugar alcohols are used to sweeten foods. Sugar alcohols are a form of sugar but are not table sugar. If a product contains sugar alcohols in place of sucrose, it can still be labeled "sugarless." Low Salt, Salt-Free, Unsalted, No  Salt, No Salt Added, Without Added Salt.  Food that is usually processed with salt has been made without salt. However, the food may contain sodium-containing additives, such as preservatives, leavening agents, or flavorings. Natural.  This term has no legal meaning. Organic.  Foods that are certified as organic have been inspected and approved by the USDA to ensure they are produced without pesticides, fertilizers containing synthetic ingredients, bioengineering, or ionizing radiation. Document Released: 10/03/2003 Document Revised: 12/23/2011 Document Reviewed: 04/20/2009 [facility] Patient Information 2014 [place], [place].

## 2014-02-04 ENCOUNTER — Encounter: Payer: Self-pay | Admitting: Internal Medicine

## 2014-02-04 ENCOUNTER — Other Ambulatory Visit: Payer: Self-pay | Admitting: *Deleted

## 2014-02-04 ENCOUNTER — Non-Acute Institutional Stay (SKILLED_NURSING_FACILITY): Payer: Medicare Other | Admitting: Internal Medicine

## 2014-02-04 DIAGNOSIS — N390 Urinary tract infection, site not specified: Secondary | ICD-10-CM

## 2014-02-04 DIAGNOSIS — E1129 Type 2 diabetes mellitus with other diabetic kidney complication: Secondary | ICD-10-CM

## 2014-02-04 DIAGNOSIS — L8990 Pressure ulcer of unspecified site, unspecified stage: Secondary | ICD-10-CM

## 2014-02-04 DIAGNOSIS — D649 Anemia, unspecified: Secondary | ICD-10-CM

## 2014-02-04 DIAGNOSIS — Z89619 Acquired absence of unspecified leg above knee: Secondary | ICD-10-CM

## 2014-02-04 DIAGNOSIS — L89109 Pressure ulcer of unspecified part of back, unspecified stage: Secondary | ICD-10-CM

## 2014-02-04 DIAGNOSIS — N186 End stage renal disease: Secondary | ICD-10-CM

## 2014-02-04 DIAGNOSIS — S78119A Complete traumatic amputation at level between unspecified hip and knee, initial encounter: Secondary | ICD-10-CM

## 2014-02-04 DIAGNOSIS — I96 Gangrene, not elsewhere classified: Secondary | ICD-10-CM

## 2014-02-04 DIAGNOSIS — R079 Chest pain, unspecified: Secondary | ICD-10-CM

## 2014-02-04 DIAGNOSIS — I10 Essential (primary) hypertension: Secondary | ICD-10-CM

## 2014-02-04 DIAGNOSIS — I5022 Chronic systolic (congestive) heart failure: Secondary | ICD-10-CM

## 2014-02-04 DIAGNOSIS — E1165 Type 2 diabetes mellitus with hyperglycemia: Secondary | ICD-10-CM

## 2014-02-04 DIAGNOSIS — J449 Chronic obstructive pulmonary disease, unspecified: Secondary | ICD-10-CM

## 2014-02-04 DIAGNOSIS — I8290 Acute embolism and thrombosis of unspecified vein: Secondary | ICD-10-CM

## 2014-02-04 DIAGNOSIS — Z992 Dependence on renal dialysis: Secondary | ICD-10-CM

## 2014-02-04 DIAGNOSIS — I749 Embolism and thrombosis of unspecified artery: Secondary | ICD-10-CM

## 2014-02-04 DIAGNOSIS — IMO0002 Reserved for concepts with insufficient information to code with codable children: Secondary | ICD-10-CM

## 2014-02-04 DIAGNOSIS — R52 Pain, unspecified: Secondary | ICD-10-CM

## 2014-02-04 DIAGNOSIS — H543 Unqualified visual loss, both eyes: Secondary | ICD-10-CM

## 2014-02-04 DIAGNOSIS — L89159 Pressure ulcer of sacral region, unspecified stage: Secondary | ICD-10-CM | POA: Insufficient documentation

## 2014-02-04 MED ORDER — OXYCODONE-ACETAMINOPHEN 10-325 MG PO TABS: 1.0000 | ORAL_TABLET | Freq: Three times a day (TID) | ORAL | Status: DC | PRN

## 2014-02-04 NOTE — Assessment & Plan Note (Signed)
-   urine cultures.( >100k colonies), Enterobacter.  - Sensitive to ciprofloxacin.  -Continue with ciprofloxacin continue ciprofloxacin until 02/05/2014 to complete 14 days.

## 2014-02-04 NOTE — Telephone Encounter (Signed)
[facility] [place] 

## 2014-02-04 NOTE — Assessment & Plan Note (Signed)
-  Continue Lantus. 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  -Needs tighter blood glucose control. A1C of 8.1 and has elevated fsg. continue SSI.  -Hypoglycemia. Lantus discontinued discharged to the nursing home on sliding scale, restart Lantus when appropriate.  -Lantus discontinued in the hospital because of hypoglycemic episodes, fasting CBGs were 105-120 for the last 2 days.

## 2014-02-04 NOTE — Assessment & Plan Note (Signed)
-   Secondary to end-stage renal disease.  - Monitor hemoglobin and hematocrit, erythropoietin per nephrology

## 2014-02-04 NOTE — Assessment & Plan Note (Signed)
-  Denies further pain. Hx of significant CAD. troponins are negative. Cardiology evaluated the pt and recommended not a candidate cardiac cath and she is not a candidate for CABG. she has high bleeding risk on dual antiplatelet therapy following PCI. Also has severe cardiomyopathy but is not a candidate for AICD Given co morbidities and poor long term prognosis..  -Recommend optimal medical management. Continue Imdur and Coreg. Patient has allergy to aspirin. Started on plavix.  -VQ scan with low probability for PE.  -Chest pain has resolved.

## 2014-02-04 NOTE — Assessment & Plan Note (Signed)
Minimal purulent discharge. ESR mildly elevated.  Appreciate wound care evaluation. On santyl dressing  Patient S/P debridement by surgery 4-15.  No need for antibiotics.  Vancomycin discontinue 4-16.  Dressing changes BID.  Surgery recommending hydrotherapy.

## 2014-02-04 NOTE — Assessment & Plan Note (Signed)
Nebs prn

## 2014-02-04 NOTE — Assessment & Plan Note (Addendum)
Patient and daughter relates pain is worse, started on fentanyl patch and oxycodone.  The patient and her daughter indicated about the narcotics pain medications The nurses have asked me to schedule pain med for her, she is getting norco 10/325 q8 prn-changed to 5/325 q6 scheduled with 5/325 q4 prn ALSO WILL STARt neurontin 300 mg TID for pain that sounds neuropathic

## 2014-02-04 NOTE — Assessment & Plan Note (Signed)
-  Denies further pain. Hx of significant CAD. troponins are negative. Cardiology evaluated the pt and recommended not a candidate cardiac cath and she is not a candidate for CABG. she has high bleeding risk on dual antiplatelet therapy following PCI. Also has severe cardiomyopathy but is not a candidate for AICD Given co morbidities and poor long term prognosis..  -Recommend optimal medical management. Continue Imdur and Coreg. Patient has allergy to aspirin. Started on plavix.  -VQ scan with low probability for PE.  -Chest pain has resolved.   

## 2014-02-04 NOTE — Assessment & Plan Note (Signed)
-  History of left hand steal syndrome.  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 No sign of infection

## 2014-02-04 NOTE — Progress Notes (Signed)
MRN: [id number] Name: [name]  Sex: female Age: 73 y.o. DOB: 1940-12-19  [place] #: [name] Facility/Room: 122A Level Of Care: SNF Provider: Hennie Duos Emergency Contacts: Extended Emergency Contact Information Primary Emergency Contact: Andrews,Sheila Address: [address]          [place], [place] [address] [place] Phone: [phone] Relation: Daughter  Code Status: FULL  Allergies: Ivp dye and Aspirin  Chief Complaint  Patient presents with  . nursing home admission    HPI: Patient is 73 y.o. female who was admitted to the hospital for CP ( neg W/U) and along the way acquired a R AKA and endstage renal dx, now on dialysis.  Past Medical History  Diagnosis Date  . CAD (coronary artery disease)   . Hypertension   . Diabetes mellitus     type 2 insulin dependent  . Hyperlipidemia   . CHF (congestive heart failure)   . Cellulitis     left foot  . Ulcer     diabetic ulcer on left foot  . DVT (deep venous thrombosis)     peroneal vein  . Peripheral vascular disease   . ESRD on hemodialysis   . Anemia   . Insomnia     Past Surgical History  Procedure Laterality Date  . Tubal ligation    . Pr vein bypass graft,aorto-fem-pop  06-26-11    1. PATENT LEFT FEMORAL-POPLITEAL BYPASS GRAFT W/NO EVEIDENCE OF STENOSIS. 2.VELOCITIES OF GREATER THAN 200 CM/'s NOTED ON PREVIOUS EXAM 10/03/11 WERE NOT ADEQUATELY VISULAIZED DURING THIS EXAM  . Coronary artery bypass graft  04/25/08    Dr. Servando Snare, CABG x 5   . Nm myoview ltd  01/13/13    LEXISCAN; LV WALL MOTION: LVEF 44%, INFERIOR AKINESIS, ANTEROAPICAL HYPOKINESIS  . Cardiac catheterization  04/22/08    SEVERE 3-VESSEL DISEASE CAD., CABG X 5 04/25/08 WITH DR. [id number]  . Av fistula placement Left 09/07/2013    Procedure: ARTERIOVENOUS (AV) FISTULA CREATION;  Surgeon: Elam Dutch, MD;  Location: [facility] OR;  Service: Vascular;  Laterality: Left;  . Ligation arteriovenous gortex graft Left 01/09/2014     Procedure: LIGATION ARTERIOVENOUS GORTEX GRAFT;  Surgeon: Elam Dutch, MD;  Location: [place];  Service: Vascular;  Laterality: Left;  . Insertion of dialysis catheter Right 01/09/2014    Procedure: INSERTION OF DIALYSIS CATHETER;  Surgeon: Elam Dutch, MD;  Location: [place];  Service: Vascular;  Laterality: Right;  Insertion of diatek to right internal Jugular artery.  . Amputation Right 01/12/2014    Procedure: AMPUTATION ABOVE KNEE- RIGHT;  Surgeon: Mal Misty, MD;  Location: [facility] OR;  Service: Vascular;  Laterality: Right;  . Above knee leg amputation Right 01/12/14      Medication List       This list is accurate as of: 02/04/14  1:49 PM.  Always use your most recent med list.               acetaminophen 650 MG CR tablet  Commonly known as:  TYLENOL  Take 650 mg by mouth every 8 (eight) hours as needed for pain.     amLODipine 5 MG tablet  Commonly known as:  NORVASC  Take 10 mg by mouth daily.     atorvastatin 40 MG tablet  Commonly known as:  LIPITOR  Take 40 mg by mouth daily.     calcitRIOL 0.25 MCG capsule  Commonly known as:  ROCALTROL  Take 0.5 mcg by mouth  daily.     carvedilol 12.5 MG tablet  Commonly known as:  COREG  Take 12.5 mg by mouth 2 (two) times daily with a meal.     ciprofloxacin 250 MG tablet  Commonly known as:  CIPRO  Take 250 mg by mouth 2 (two) times daily.     clopidogrel 75 MG tablet  Commonly known as:  PLAVIX  Take 1 tablet (75 mg total) by mouth daily.     collagenase ointment  Commonly known as:  SANTYL  Apply topically daily.     diphenhydrAMINE 25 MG tablet  Commonly known as:  BENADRYL  Take 25 mg by mouth every 6 (six) hours as needed for itching.     feeding supplement (NEPRO CARB STEADY) Liqd  Take 237 mLs by mouth 3 (three) times daily with meals.     fentaNYL 25 MCG/HR patch  Commonly known as:  DURAGESIC - dosed mcg/hr  Place 1 patch (25 mcg total) onto the skin every 3 (three) days.     ferrous sulfate 325  (65 FE) MG tablet  Take 325 mg by mouth 2 (two) times daily.     folic acid 1 MG tablet  Commonly known as:  FOLVITE  Take 1 mg by mouth daily.     isosorbide mononitrate 30 MG 24 hr tablet  Commonly known as:  IMDUR  Take 15 mg by mouth daily.     lanthanum 500 MG chewable tablet  Commonly known as:  FOSRENOL  Chew 1 tablet (500 mg total) by mouth 3 (three) times daily with meals.     mirtazapine 7.5 MG tablet  Commonly known as:  REMERON  Take 7.5 mg by mouth at bedtime.     nitroGLYCERIN 0.4 MG SL tablet  Commonly known as:  NITROSTAT  Place 0.4 mg under the tongue every 5 (five) minutes as needed for chest pain.     omega-3 acid ethyl esters 1 G capsule  Commonly known as:  LOVAZA  Take 1 g by mouth 4 (four) times daily.     omeprazole 20 MG capsule  Commonly known as:  PRILOSEC  Take 20 mg by mouth daily.     oxyCODONE-acetaminophen 10-325 MG per tablet  Commonly known as:  PERCOCET  Take 1 tablet by mouth 3 (three) times daily as needed for pain.     potassium chloride SA 20 MEQ tablet  Commonly known as:  K-DUR,KLOR-CON  Take 20 mEq by mouth daily.     vitamin C 500 MG tablet  Commonly known as:  ASCORBIC ACID  Take 500 mg by mouth daily.     zinc sulfate 220 MG capsule  Take 220 mg by mouth daily.        Meds ordered this encounter  Medications  . ciprofloxacin (CIPRO) 250 MG tablet    Sig: Take 250 mg by mouth 2 (two) times daily.    Immunization History  Administered Date(s) Administered  . Pneumococcal Polysaccharide-23 08/21/2013  . Tdap 10/04/2013    History  Substance Use Topics  . Smoking status: Former Smoker -- 2.00 packs/day for 20 years    Types: Cigarettes    Quit date: 07/15/1996  . Smokeless tobacco: Former Systems developer    Types: Snuff  . Alcohol Use: No    Family history is noncontributory    Review of Systems  DATA OBTAINED: from patient, GENERAL:  no fevers, fatigue, doesn't want to eat, will drink SKIN: No itching, rash  sacral decub EYES: No eye  pain, redness, discharge EARS: No earache, tinnitus, change in hearing NOSE: No congestion, drainage or bleeding  MOUTH/THROAT: No mouth or tooth pain, No sore throat, No difficulty chewing or swallowing  RESPIRATORY: No cough, wheezing, SOB CARDIAC: No chest pain, palpitations, lower extremity edema  GI: No abdominal pain, No N/V/D or constipation, No heartburn or reflux  GU: No dysuria, frequency or urgency, or incontinence  MUSCULOSKELETAL: c/o r leg/stump pain;admits it is like an electric shock NEUROLOGIC: No headache, dizziness or focal weakness PSYCHIATRIC: No overt anxiety or sadness. Sleeps well. No behavior issue.   Filed Vitals:   02/04/14 1310  BP: 140/67  Pulse: 86  Temp: 98.2 F (36.8 C)  Resp: 18    Physical Exam  GENERAL APPEARANCE: Alert, conversant. Appropriately groomed. No acute distress. ;very ornery SKIN: No diaphoresis rash; wound not undressed, to be changed after lunch HEAD: Normocephalic, atraumatic  EYES: Conjunctiva/lids clear. Pupils round, reactive. EOMs intact.  EARS: External exam WNL, canals clear. Hearing grossly normal.  NOSE: No deformity or discharge.  MOUTH/THROAT: Lips w/o lesions.   RESPIRATORY: Breathing is even, unlabored. Lung sounds are clear   CARDIOVASCULAR: Heart RRR no murmurs, rubs or gallops. No peripheral edema.   GASTROINTESTINAL: Abdomen is soft, non-tender, not distended w/ normal bowel sounds. No mass, ventral or inguinal hernia. No organomegally GENITOURINARY: Bladder non tender, not distended  MUSCULOSKELETAL: R AKA; L little finger dark, dry NEUROLOGIC: . Cranial nerves 2-12 grossly intact. Moves all extremities no tremor. PSYCHIATRIC: as stated, ornery, no behavioral issues  Patient Active Problem List   Diagnosis Date Noted  . Sacral decubitus ulcer 02/04/2014  . Inadequate pain control 02/04/2014  . Gangrene of finger 02/04/2014  . S/P AKA (above knee amputation) 02/04/2014  . VTE  (venous thromboembolism) 02/04/2014  . Protein-calorie malnutrition, severe 01/31/2014  . 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 Blindness of both eyes, impairment level not further specified 01/25/2014  . Malnutrition of moderate degree 01/24/2014  . Chest pain 01/23/2014  . UTI (lower urinary tract infection) 01/23/2014  . Visual loss, bilateral 01/23/2014  . Multifocal atrial tachycardia 01/14/2014  . ESRD on dialysis 01/13/2014  . Palliative care encounter 01/10/2014  . DM type 2, uncontrolled, with renal complications 98/33/8250  . Atherosclerotic PVD with ulceration 12/31/2013  . Acute on chronic systolic CHF (congestive heart failure) 08/17/2013  . Renal failure, acute on chronic stage 4 08/17/2013  . Anemia 08/17/2013  . Essential hypertension 03/30/2013  . S/P CABG x 5 03/02/2013  . Chronic systolic heart failure 53/97/6734  . Abnormal nuclear stress test 03/02/2013  . CKD (chronic kidney disease), stage IV 03/02/2013  . H/O epistaxis 03/02/2013  . COPD (chronic obstructive pulmonary disease) 03/02/2013  . OSA on CPAP 03/02/2013  . Peripheral vascular disease, unspecified 05/13/2012  . PVD (peripheral vascular disease) 10/29/2011    CBC    Component Value Date/Time   WBC 14.5* 02/02/2014 0500   RBC 3.75* 02/02/2014 0500   HGB 9.8* 02/02/2014 0500   HCT 31.4* 02/02/2014 0500   PLT 376 02/02/2014 0500   MCV 83.7 02/02/2014 0500   LYMPHSABS 1.1 01/23/2014 1139   MONOABS 0.6 01/23/2014 1139   EOSABS 0.2 01/23/2014 1139   BASOSABS 0.0 01/23/2014 1139    CMP     Component Value Date/Time   NA 137 02/02/2014 0724   K 3.8 02/02/2014 0724   CL 95* 02/02/2014 0724   CO2 26 02/02/2014 0724   GLUCOSE 105* 02/02/2014 0724   BUN 28* 02/02/2014  0724   CREATININE 4.09* 02/02/2014 0724   CALCIUM 9.5 02/02/2014 0724   CALCIUM 8.7 07/01/2011 1831   PROT 7.1 01/23/2014 1139   ALBUMIN 2.0* 02/02/2014 0724   AST 32 01/23/2014 1139   ALT 15 01/23/2014 1139   ALKPHOS 134* 01/23/2014  1139   BILITOT 0.4 01/23/2014 1139   GFRNONAA 10* 02/02/2014 0724   GFRAA 12* 02/02/2014 0724    Assessment and Plan  Chest pain -Denies further pain. Hx of significant CAD. troponins are negative. Cardiology evaluated the pt and recommended not a candidate cardiac cath and she is not a candidate for CABG. she has high bleeding risk on dual antiplatelet therapy following PCI. Also has severe cardiomyopathy but is not a candidate for AICD Given co morbidities and poor long term prognosis..  -Recommend optimal medical management. Continue Imdur and Coreg. Patient has allergy to aspirin. Started on plavix.  -VQ scan with low probability for PE.  -Chest pain has resolved.    Visual loss, bilateral -Denies further pain. Hx of significant CAD. troponins are negative. Cardiology evaluated the pt and recommended not a candidate cardiac cath and she is not a candidate for CABG. she has high bleeding risk on dual antiplatelet therapy following PCI. Also has severe cardiomyopathy but is not a candidate for AICD Given co morbidities and poor long term prognosis..  -Recommend optimal medical management. Continue Imdur and Coreg. Patient has allergy to aspirin. Started on plavix.  -VQ scan with low probability for PE.  -Chest pain has resolved.    UTI (lower urinary tract infection) - urine cultures.( >100k colonies), Enterobacter.  - Sensitive to ciprofloxacin.  -Continue with ciprofloxacin continue ciprofloxacin until 02/05/2014 to complete 14 days.   Sacral decubitus ulcer Minimal purulent discharge. ESR mildly elevated.  Appreciate wound care evaluation. On santyl dressing  Patient S/P debridement by surgery 4-15.  No need for antibiotics.  Vancomycin discontinue 4-16.  Dressing changes BID.  Surgery recommending hydrotherapy.    Inadequate pain control Patient and daughter relates pain is worse, started on fentanyl patch and oxycodone.  The patient and her daughter indicated about the  narcotics pain medications The nurses have asked me to schedule pain med for her, she is getting norco 10/325 q8 prn-changed to 5/325 q6 scheduled with 5/325 q4 prn  Gangrene of finger -History of left hand steal syndrome.  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 No sign of infection    Anemia - Secondary to end-stage renal disease.  - Monitor hemoglobin and hematocrit, erythropoietin per nephrology   ESRD on dialysis Renal following. HD per schedule.. Continue hectoral and Fosrenol with meals.  Patient now wants to continue with dialysis.    DM type 2, uncontrolled, with renal complications -Continue Lantus. 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  -Needs tighter blood glucose control. A1C of 8.1 and has elevated fsg. continue SSI.  -Hypoglycemia. Lantus discontinued discharged to the nursing home on sliding scale, restart Lantus when appropriate.  -Lantus discontinued in the hospital because of hypoglycemic episodes, fasting CBGs were 105-120 for the last 2 days.      Chronic systolic heart failure EF of 30-35% on recent echo. Appears compensated. Continue beta blocker, indoor and hydralazine   Essential hypertension Stable, continue amlodipine, Coreg. Added hydralazine. Dose of Imdur increased. BP stable.      S/P AKA (above knee amputation) - Staples in place, no discharge erythema at the stump site.  -will Notify vascular for staples removal   VTE (venous thromboembolism) - Recently taken off  anticoagulation, she was on Coumadin..  - Review of past medical history shows a history of peroneal vein DVT, however a recent lower extremity Doppler was negative.  -Since VQ scan is negative, Doppler of lower extremity was not repeated. But denies any pain.      COPD (chronic obstructive pulmonary disease) Nebs prn    Hennie Duos, MD

## 2014-02-04 NOTE — Assessment & Plan Note (Signed)
-   Staples in place, no discharge erythema at the stump site.  -will Notify vascular for staples removal

## 2014-02-04 NOTE — Assessment & Plan Note (Signed)
Stable, continue amlodipine, Coreg. Added hydralazine. Dose of Imdur increased. BP stable.

## 2014-02-04 NOTE — Assessment & Plan Note (Signed)
-   Recently taken off anticoagulation, she was on Coumadin..  - Review of past medical history shows a history of peroneal vein DVT, however a recent lower extremity Doppler was negative.  -Since VQ scan is negative, Doppler of lower extremity was not repeated. But denies any pain.

## 2014-02-04 NOTE — Assessment & Plan Note (Signed)
EF of 30-35% on recent echo. Appears compensated. Continue beta blocker, indoor and hydralazine

## 2014-02-04 NOTE — Assessment & Plan Note (Signed)
Renal following. HD per schedule.. Continue hectoral and Fosrenol with meals.  Patient now wants to continue with dialysis.

## 2014-02-08 ENCOUNTER — Other Ambulatory Visit: Payer: Self-pay | Admitting: *Deleted

## 2014-02-08 DIAGNOSIS — I2510 Atherosclerotic heart disease of native coronary artery without angina pectoris: Secondary | ICD-10-CM

## 2014-02-08 NOTE — Addendum Note (Signed)
Addended by: Lindell Spar on: 02/08/2014 07:48 AM   Modules accepted: Orders

## 2014-02-11 ENCOUNTER — Non-Acute Institutional Stay (SKILLED_NURSING_FACILITY): Payer: Medicare Other | Admitting: Internal Medicine

## 2014-02-11 DIAGNOSIS — Z710 Person encountering health services to consult on behalf of another person: Secondary | ICD-10-CM | POA: Diagnosis not present

## 2014-02-11 DIAGNOSIS — Z5189 Encounter for other specified aftercare: Secondary | ICD-10-CM | POA: Diagnosis not present

## 2014-02-11 DIAGNOSIS — L89109 Pressure ulcer of unspecified part of back, unspecified stage: Secondary | ICD-10-CM

## 2014-02-11 DIAGNOSIS — N186 End stage renal disease: Secondary | ICD-10-CM

## 2014-02-11 DIAGNOSIS — L8990 Pressure ulcer of unspecified site, unspecified stage: Secondary | ICD-10-CM | POA: Diagnosis not present

## 2014-02-11 DIAGNOSIS — L97309 Non-pressure chronic ulcer of unspecified ankle with unspecified severity: Secondary | ICD-10-CM

## 2014-02-11 DIAGNOSIS — Z992 Dependence on renal dialysis: Secondary | ICD-10-CM

## 2014-02-11 DIAGNOSIS — L89159 Pressure ulcer of sacral region, unspecified stage: Secondary | ICD-10-CM

## 2014-02-12 ENCOUNTER — Encounter: Payer: Self-pay | Admitting: Internal Medicine

## 2014-02-12 NOTE — Assessment & Plan Note (Signed)
Spoke at length with Peggye Fothergill by phone-it was her day off. She knows pt well. She has been debriding the wound during the week, there is considerable undermining. Looks better and will likely look bigger 2/2 undermining before it starts good healing. She felt pt could not tolerate pulse lavage because she has had difficulty tolerating the position she has to be in for the wound care. Erskine Squibb was concerned that she sat on he wound Wednesday when she was out of the facility all day long until evening but when Erskine Squibb saw the wound on Thursday it was not worse. The wound today looked very very clean and I noted the undermining. Pt tolerated the wound care well. Care took 30+ minutes.

## 2014-02-12 NOTE — Progress Notes (Signed)
MRN: [id number] Name: [name]  Sex: female Age: 73 y.o. DOB: July 25, 1941  PSC #: [name] Facility/Room: 122A Level Of Care: SNF Provider: Margit Hanks Emergency Contacts: Extended Emergency Contact Information Primary Emergency Contact: [name],Sheila Address: [address]          [place], [place] [address] [place] Home Phone: [phone] Relation: Daughter  Code Status:FULL   Allergies: Ivp dye and Aspirin  Chief Complaint  Patient presents with  . Medical Management of Chronic Issues  . family conference with pt present    HPI: Patient is 73 y.o. female who is being seen because her daughter believes that she is not getting her pain meds and because she believes that her sacral decubitus ulcer is worse. Pt has been at [facility] for a week only. Pt's PCP saw her 2 days ago and I'm checking in on the pt for him.  Past Medical History  Diagnosis Date  . CAD (coronary artery disease)   . Hypertension   . Diabetes mellitus     type 2 insulin dependent  . Hyperlipidemia   . CHF (congestive heart failure)   . Cellulitis     left foot  . Ulcer     diabetic ulcer on left foot  . DVT (deep venous thrombosis)     peroneal vein  . Peripheral vascular disease   . ESRD on hemodialysis   . Anemia   . Insomnia     Past Surgical History  Procedure Laterality Date  . Tubal ligation    . Pr vein bypass graft,aorto-fem-pop  06-26-11    1. PATENT LEFT FEMORAL-POPLITEAL BYPASS GRAFT W/NO EVEIDENCE OF STENOSIS. 2.VELOCITIES OF GREATER THAN 200 CM/'s NOTED ON PREVIOUS EXAM 10/03/11 WERE NOT ADEQUATELY VISULAIZED DURING THIS EXAM  . Coronary artery bypass graft  04/25/08    Dr. Tyrone Sage, CABG x 5   . Nm myoview ltd  01/13/13    LEXISCAN; LV WALL MOTION: LVEF 44%, INFERIOR AKINESIS, ANTEROAPICAL HYPOKINESIS  . Cardiac catheterization  04/22/08    SEVERE 3-VESSEL DISEASE CAD., CABG X 5 04/25/08 WITH DR. [id number]  . Av fistula placement Left 09/07/2013    Procedure:  ARTERIOVENOUS (AV) FISTULA CREATION;  Surgeon: Sherren Kerns, MD;  Location: [facility] OR;  Service: Vascular;  Laterality: Left;  . Ligation arteriovenous gortex graft Left 01/09/2014    Procedure: LIGATION ARTERIOVENOUS GORTEX GRAFT;  Surgeon: Sherren Kerns, MD;  Location: [facility] OR;  Service: Vascular;  Laterality: Left;  . Insertion of dialysis catheter Right 01/09/2014    Procedure: INSERTION OF DIALYSIS CATHETER;  Surgeon: Sherren Kerns, MD;  Location: [facility] OR;  Service: Vascular;  Laterality: Right;  Insertion of diatek to right internal Jugular artery.  . Amputation Right 01/12/2014    Procedure: AMPUTATION ABOVE KNEE- RIGHT;  Surgeon: Pryor Ochoa, MD;  Location: [facility] OR;  Service: Vascular;  Laterality: Right;  . Above knee leg amputation Right 01/12/14      Medication List       This list is accurate as of: 02/11/14 11:59 PM.  Always use your most recent med list.               acetaminophen 650 MG CR tablet  Commonly known as:  TYLENOL  Take 650 mg by mouth every 8 (eight) hours as needed for pain.     amLODipine 5 MG tablet  Commonly known as:  NORVASC  Take 10 mg by mouth daily.     atorvastatin 40 MG  tablet  Commonly known as:  LIPITOR  Take 40 mg by mouth daily.     calcitRIOL 0.25 MCG capsule  Commonly known as:  ROCALTROL  Take 0.5 mcg by mouth daily.     carvedilol 12.5 MG tablet  Commonly known as:  COREG  Take 12.5 mg by mouth 2 (two) times daily with a meal.     clopidogrel 75 MG tablet  Commonly known as:  PLAVIX  Take 1 tablet (75 mg total) by mouth daily.     collagenase ointment  Commonly known as:  SANTYL  Apply topically daily.     diphenhydrAMINE 25 MG tablet  Commonly known as:  BENADRYL  Take 25 mg by mouth every 6 (six) hours as needed for itching.     feeding supplement (NEPRO CARB STEADY) Liqd  Take 237 mLs by mouth 3 (three) times daily with meals.     fentaNYL 25 MCG/HR patch  Commonly known as:  DURAGESIC - dosed mcg/hr  Place 1 patch  (25 mcg total) onto the skin every 3 (three) days.     ferrous sulfate 325 (65 FE) MG tablet  Take 325 mg by mouth 2 (two) times daily.     folic acid 1 MG tablet  Commonly known as:  FOLVITE  Take 1 mg by mouth daily.     HYDROcodone-acetaminophen 5-325 MG per tablet  Commonly known as:  NORCO/VICODIN  Take 1 tablet by mouth every 6 (six) hours. And I po q6 prn     isosorbide mononitrate 30 MG 24 hr tablet  Commonly known as:  IMDUR  Take 15 mg by mouth daily.     lanthanum 500 MG chewable tablet  Commonly known as:  FOSRENOL  Chew 1 tablet (500 mg total) by mouth 3 (three) times daily with meals.     mirtazapine 7.5 MG tablet  Commonly known as:  REMERON  Take 7.5 mg by mouth at bedtime.     nitroGLYCERIN 0.4 MG SL tablet  Commonly known as:  NITROSTAT  Place 0.4 mg under the tongue every 5 (five) minutes as needed for chest pain.     omega-3 acid ethyl esters 1 G capsule  Commonly known as:  LOVAZA  Take 1 g by mouth 4 (four) times daily.     omeprazole 20 MG capsule  Commonly known as:  PRILOSEC  Take 20 mg by mouth daily.     oxyCODONE-acetaminophen 10-325 MG per tablet  Commonly known as:  PERCOCET  Take 1 tablet by mouth 3 (three) times daily as needed for pain.     potassium chloride SA 20 MEQ tablet  Commonly known as:  K-DUR,KLOR-CON  Take 20 mEq by mouth daily.     vitamin C 500 MG tablet  Commonly known as:  ASCORBIC ACID  Take 500 mg by mouth daily.     zinc sulfate 220 MG capsule  Take 220 mg by mouth daily.        Meds ordered this encounter  Medications  . HYDROcodone-acetaminophen (NORCO/VICODIN) 5-325 MG per tablet    Sig: Take 1 tablet by mouth every 6 (six) hours. And I po q6 prn    Immunization History  Administered Date(s) Administered  . Pneumococcal Polysaccharide-23 08/21/2013  . Tdap 10/04/2013    History  Substance Use Topics  . Smoking status: Former Smoker -- 2.00 packs/day for 20 years    Types: Cigarettes    Quit  date: 07/15/1996  . Smokeless tobacco: Former Neurosurgeon  Types: Snuff  . Alcohol Use: No    Review of Systems  DATA OBTAINED: from patient, nurse GENERAL:  no fevers, fatigue, appetite changes SKIN: No itching, rash HEENT: No complaint RESPIRATORY: No cough, wheezing, SOB CARDIAC: No chest pain, palpitations, lower extremity edema  GI: No abdominal pain, No N/V/D or constipation, No heartburn or reflux  GU: No dysuria, frequency or urgency, or incontinence  MUSCULOSKELETAL: pt c/o pain;c/o pain in fingers, not sacrum; says she didn't get her pain pill this am;I know for a fact she did because its scheduled and I spoke with her nurse before I walked in the room NEUROLOGIC: No headache, dizziness or focal weakness PSYCHIATRIC: Per nursing pt's confusion and difficulty has improved a lot over the week.   Filed Vitals:   02/12/14 1243  BP: 103/77  Pulse: 100  Temp: 97.8 F (36.6 C)  Resp: 19    Physical Exam  GENERAL APPEARANCE: Alert, conversant. Appropriately groomed. No acute distress  SKIN: No diaphoresis rash; sacral wound - attended the dressing change, which was laborious. 2 wounds-sacral wound and L buttocks-stage 3;both look very clean, muscle is exposed, a bit of pink granulation tissue is evident; I know from wound nurse that wound has been debrided over the week pt has been here HEENT: Unremarkable RESPIRATORY: Breathing is even, unlabored. Lung sounds are clear   CARDIOVASCULAR: Heart RRR no murmurs, rubs or gallops. No peripheral edema  GASTROINTESTINAL: Abdomen is soft, non-tender, not distended w/ normal bowel sounds.  GENITOURINARY: Bladder non tender, not distended  MUSCULOSKELETAL: L 5th is completely black and ring is black medial side NEUROLOGIC: Cranial nerves 2-12 grossly intact. Moves all extremities no tremor. PSYCHIATRIC: Has some confusion/memory issues, no behavioral issues  Patient Active Problem List   Diagnosis Date Noted  . Sacral decubitus ulcer  02/04/2014  . Inadequate pain control 02/04/2014  . Gangrene of finger 02/04/2014  . S/P AKA (above knee amputation) 02/04/2014  . VTE (venous thromboembolism) 02/04/2014  . Protein-calorie malnutrition, severe 01/31/2014  . 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 Blindness of both eyes, impairment level not further specified 01/25/2014  . Malnutrition of moderate degree 01/24/2014  . Chest pain 01/23/2014  . UTI (lower urinary tract infection) 01/23/2014  . Visual loss, bilateral 01/23/2014  . Multifocal atrial tachycardia 01/14/2014  . ESRD on dialysis 01/13/2014  . Palliative care encounter 01/10/2014  . DM type 2, uncontrolled, with renal complications 01/01/2014  . Atherosclerotic PVD with ulceration 12/31/2013  . Acute on chronic systolic CHF (congestive heart failure) 08/17/2013  . Renal failure, acute on chronic stage 4 08/17/2013  . Anemia 08/17/2013  . Essential hypertension 03/30/2013  . S/P CABG x 5 03/02/2013  . Chronic systolic heart failure 03/02/2013  . Abnormal nuclear stress test 03/02/2013  . CKD (chronic kidney disease), stage IV 03/02/2013  . H/O epistaxis 03/02/2013  . COPD (chronic obstructive pulmonary disease) 03/02/2013  . OSA on CPAP 03/02/2013  . Peripheral vascular disease, unspecified 05/13/2012  . PVD (peripheral vascular disease) 10/29/2011    CBC    Component Value Date/Time   WBC 14.5* 02/02/2014 0500   RBC 3.75* 02/02/2014 0500   HGB 9.8* 02/02/2014 0500   HCT 31.4* 02/02/2014 0500   PLT 376 02/02/2014 0500   MCV 83.7 02/02/2014 0500   LYMPHSABS 1.1 01/23/2014 1139   MONOABS 0.6 01/23/2014 1139   EOSABS 0.2 01/23/2014 1139   BASOSABS 0.0 01/23/2014 1139    CMP     Component Value Date/Time  NA 137 02/02/2014 0724   K 3.8 02/02/2014 0724   CL 95* 02/02/2014 0724   CO2 26 02/02/2014 0724   GLUCOSE 105* 02/02/2014 0724   BUN 28* 02/02/2014 0724   CREATININE 4.09* 02/02/2014 0724   CALCIUM 9.5 02/02/2014 0724   CALCIUM 8.7 07/01/2011  1831   PROT 7.1 01/23/2014 1139   ALBUMIN 2.0* 02/02/2014 0724   AST 32 01/23/2014 1139   ALT 15 01/23/2014 1139   ALKPHOS 134* 01/23/2014 1139   BILITOT 0.4 01/23/2014 1139   GFRNONAA 10* 02/02/2014 0724   GFRAA 12* 02/02/2014 0724    Assessment and Plan  PAIN ISSUES-  I checked pt's pain medications. She is never without pain meds as she is on a fentanyl patch. Even when she says she is in pain se seems completely comfortable. She came from the hospital with prn pain meds and I changed her to a scheduled norco 5 mg q6 with a prn 5 mg q6 as well and a percocet 10 mg prn. She is tiny, I had concerns for her getting too much. She appears narcotic habituated so I'm changing her to a norco 5mg  q4 scheduled. Pt does not remember things. She argued with me about her getting her morning pill. There is no question that she is getting scheduled med. Daily counts would be off if she wasn't and I know the nurse who has been there all week.  Sacral decubitus ulcer Spoke at length with [name] by phone-it was her day off. She knows pt well. She has been debriding the wound during the week, there is considerable undermining. Looks better and will likely look bigger 2/2 undermining before it starts good healing. She felt pt could not tolerate pulse lavage because she has had difficulty tolerating the position she has to be in for the wound care. [name] was concerned that she sat on he wound Wednesday when she was out of the facility all day long until evening but when [name] saw the wound on Thursday it was not worse. The wound today looked very very clean and I noted the undermining. Pt tolerated the wound care well. Care took 30+ minutes.  FAMILY CONFERENCE - I called pt's daughter [name] after I observed the wound and after I had reviewed all meds with pt's nurse and spoken to both wound care nurses. Rhonda [name] told me that she was going to take Mom home on Monday. She told me that she had some nursing  training and some doctor training too. She will be at home with Mom and has other family members who can help. She knows the wound care people and pt has appt there on 5/13. I shared with her my findings with pain meds and the wounds. She believes Mom absolutely when she says she is not getting her medication. I don't think she realiezs/acknowledges that pt has memory/confusion problems. She complained about Mom not getting her diapers changed promptly and she herself wants to use cloth diapers. It was obvious that disagreeing with Rhonda [name] would only lead to argument which would not help the pt or family in any way. I told Rhonda [name] that I would make social workers aware. [name] told me she had given her list of medical equipment needed to Dr Juleen ChinaKohut. I spoke with the social workers and admin. Even though no one thinks it is a good idea for the pt to leave I will not make her leave AMA. There is not much time  to get things set up for Monday but social workers are on it.  Margit Hanks, MD

## 2014-02-14 ENCOUNTER — Encounter: Payer: Self-pay | Admitting: Vascular Surgery

## 2014-02-15 ENCOUNTER — Ambulatory Visit (INDEPENDENT_AMBULATORY_CARE_PROVIDER_SITE_OTHER): Payer: Self-pay | Admitting: Vascular Surgery

## 2014-02-15 ENCOUNTER — Non-Acute Institutional Stay (SKILLED_NURSING_FACILITY): Payer: Medicare Other | Admitting: Internal Medicine

## 2014-02-15 ENCOUNTER — Encounter: Payer: Self-pay | Admitting: Vascular Surgery

## 2014-02-15 VITALS — BP 182/107 | HR 97 | Resp 14 | Ht 65.0 in | Wt 118.0 lb

## 2014-02-15 DIAGNOSIS — I5022 Chronic systolic (congestive) heart failure: Secondary | ICD-10-CM

## 2014-02-15 DIAGNOSIS — L8990 Pressure ulcer of unspecified site, unspecified stage: Secondary | ICD-10-CM

## 2014-02-15 DIAGNOSIS — D649 Anemia, unspecified: Secondary | ICD-10-CM

## 2014-02-15 DIAGNOSIS — Z951 Presence of aortocoronary bypass graft: Secondary | ICD-10-CM

## 2014-02-15 DIAGNOSIS — J449 Chronic obstructive pulmonary disease, unspecified: Secondary | ICD-10-CM

## 2014-02-15 DIAGNOSIS — I471 Supraventricular tachycardia: Secondary | ICD-10-CM

## 2014-02-15 DIAGNOSIS — N184 Chronic kidney disease, stage 4 (severe): Secondary | ICD-10-CM | POA: Insufficient documentation

## 2014-02-15 DIAGNOSIS — L89109 Pressure ulcer of unspecified part of back, unspecified stage: Secondary | ICD-10-CM

## 2014-02-15 DIAGNOSIS — IMO0002 Reserved for concepts with insufficient information to code with codable children: Secondary | ICD-10-CM

## 2014-02-15 DIAGNOSIS — N186 End stage renal disease: Secondary | ICD-10-CM

## 2014-02-15 DIAGNOSIS — E1165 Type 2 diabetes mellitus with hyperglycemia: Secondary | ICD-10-CM

## 2014-02-15 DIAGNOSIS — I4719 Other supraventricular tachycardia: Secondary | ICD-10-CM

## 2014-02-15 DIAGNOSIS — H543 Unqualified visual loss, both eyes: Secondary | ICD-10-CM

## 2014-02-15 DIAGNOSIS — I498 Other specified cardiac arrhythmias: Secondary | ICD-10-CM

## 2014-02-15 DIAGNOSIS — I749 Embolism and thrombosis of unspecified artery: Secondary | ICD-10-CM

## 2014-02-15 DIAGNOSIS — I8290 Acute embolism and thrombosis of unspecified vein: Secondary | ICD-10-CM

## 2014-02-15 DIAGNOSIS — I10 Essential (primary) hypertension: Secondary | ICD-10-CM

## 2014-02-15 DIAGNOSIS — I96 Gangrene, not elsewhere classified: Secondary | ICD-10-CM

## 2014-02-15 DIAGNOSIS — Z992 Dependence on renal dialysis: Secondary | ICD-10-CM

## 2014-02-15 DIAGNOSIS — L89159 Pressure ulcer of sacral region, unspecified stage: Secondary | ICD-10-CM

## 2014-02-15 DIAGNOSIS — E1129 Type 2 diabetes mellitus with other diabetic kidney complication: Secondary | ICD-10-CM

## 2014-02-15 DIAGNOSIS — I739 Peripheral vascular disease, unspecified: Secondary | ICD-10-CM

## 2014-02-15 NOTE — Addendum Note (Signed)
Addended by: Sharee Pimple on: 02/15/2014 12:28 PM   Modules accepted: Orders

## 2014-02-15 NOTE — Progress Notes (Signed)
Subjective:     Patient ID: [name], female   DOB: May 08, 1941, 73 y.o.   MRN: [id number]  HPI this 73 year old female returns status post right AKA approximately 6 weeks ago. Right AKA stump is healing nicely. She has known severe tibial occlusive disease bilaterally. 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 She is currently dialyzed through a right IJ catheter. Her daughter has multiple questions. She is concerned about the left first toe which has developed a dark area on the tip.  Review of Systems     Objective:   Physical Exam BP 182/107  Pulse 97  Resp 14  Ht 5\' 5"  (1.651 m)  Wt 118 lb (53.524 kg)  BMI 19.64 kg/m2   general elderly female no apparent distressalert and oriented x3 in a wheelchair accompanied by her daughter Right AKA is healing satisfactorily-skin staples removed Left leg with 2+ femoral popliteal pulse palpable. Left first toe has darkness 1 cm in diameter on the tip with minimal erythema. No ulceration noted. No pressure sores noted. Left upper extremity with 2+ brachial pulse. Dry gangrene of portion of fourth and fifth fingers with no infection.     Assessment:     Right AKA healing nicely with skin staples removed.  Left first toe with chronic ischemic changes. Nonretention struck a pole. Will refer to Dr. Lajoyce Corners for further evaluation and recommendations Dry gangrene digits left hand-Will refer to hand surgeon to follow this because it will need formal amputation Patient referred for further access. She has identical situation and upper extremity on the right with no distal pulses palpable and 2+ brachial pulse and a high likelihood of steal syndrome. She has just had right AKA and has ischemic changes left foot so as not candidate for thigh graft that present time Do not feel we should proceed with further access at this time.    Plan:

## 2014-02-16 ENCOUNTER — Encounter: Payer: Self-pay | Admitting: Internal Medicine

## 2014-02-16 NOTE — Progress Notes (Signed)
MRN: [id number] Name: [name]  Sex: female Age: 73 y.o. DOB: 1941-04-30  PSC #: [name] Facility/Room: Level Of Care: SNF Provider: Margit Hanks Emergency Contacts: Extended Emergency Contact Information Primary Emergency Contact: Andrews,Sheila Address: [address]          [place], [place] [address] [place] Home Phone: [phone] Relation: Daughter  Code Status: FULL  Allergies: Ivp dye and Aspirin  Chief Complaint  Patient presents with  . Discharge Note    HPI: Patient is 73 y.o. female who came to SNF a week and a half ago with a stage 3 decubitus ulcer and s/p hospitalization for CP with dx but no intervention possible. Pt's daughter desires to take pt home to care for her.  Past Medical History  Diagnosis Date  . CAD (coronary artery disease)   . Hypertension   . Diabetes mellitus     type 2 insulin dependent  . Hyperlipidemia   . CHF (congestive heart failure)   . Cellulitis     left foot  . Ulcer     diabetic ulcer on left foot  . DVT (deep venous thrombosis)     peroneal vein  . Peripheral vascular disease   . ESRD on hemodialysis   . Anemia   . Insomnia     Past Surgical History  Procedure Laterality Date  . Tubal ligation    . Pr vein bypass graft,aorto-fem-pop  06-26-11    1. PATENT LEFT FEMORAL-POPLITEAL BYPASS GRAFT W/NO EVEIDENCE OF STENOSIS. 2.VELOCITIES OF GREATER THAN 200 CM/'s NOTED ON PREVIOUS EXAM 10/03/11 WERE NOT ADEQUATELY VISULAIZED DURING THIS EXAM  . Coronary artery bypass graft  04/25/08    Dr. Tyrone Sage, CABG x 5   . Nm myoview ltd  01/13/13    LEXISCAN; LV WALL MOTION: LVEF 44%, INFERIOR AKINESIS, ANTEROAPICAL HYPOKINESIS  . Cardiac catheterization  04/22/08    SEVERE 3-VESSEL DISEASE CAD., CABG X 5 04/25/08 WITH DR. [id number]  . Av fistula placement Left 09/07/2013    Procedure: ARTERIOVENOUS (AV) FISTULA CREATION;  Surgeon: Sherren Kerns, MD;  Location[facility] OR;  Service: Vascular;  Laterality: Left;  .  Ligation arteriovenous gortex graft Left 01/09/2014    Procedure: LIGATION ARTERIOVENOUS GORTEX GRAFT;  Surgeon: Sherren Kerns, MD;  Location: [facility] OR;  Service: Vascular;  Laterality: Left;  . Insertion of dialysis catheter Right 01/09/2014    Procedure: INSERTION OF DIALYSIS CATHETER;  Surgeon: Sherren Kerns, MD;  Location: [facility] OR;  Service: Vascular;  Laterality: Right;  Insertion of diatek to right internal Jugular artery.  . Amputation Right 01/12/2014    Procedure: AMPUTATION ABOVE KNEE- RIGHT;  Surgeon: Pryor Ochoa, MD;  Location: [facility] OR;  Service: Vascular;  Laterality: Right;  . Above knee leg amputation Right 01/12/14      Medication List       This list is accurate as of: 02/15/14 11:59 PM.  Always use your most recent med list.               acetaminophen 650 MG CR tablet  Commonly known as:  TYLENOL  Take 650 mg by mouth every 8 (eight) hours as needed for pain.     amLODipine 5 MG tablet  Commonly known as:  NORVASC  Take 10 mg by mouth daily.     atorvastatin 40 MG tablet  Commonly known as:  LIPITOR  Take 40 mg by mouth daily.     b complex-vitamin c-folic acid 0.8 MG Tabs  tablet  Take 1 tablet by mouth daily.     calcitRIOL 0.25 MCG capsule  Commonly known as:  ROCALTROL  Take 0.5 mcg by mouth daily.     clopidogrel 75 MG tablet  Commonly known as:  PLAVIX  Take 1 tablet (75 mg total) by mouth daily.     collagenase ointment  Commonly known as:  SANTYL  Apply topically daily.     diphenhydrAMINE 25 MG tablet  Commonly known as:  BENADRYL  Take 25 mg by mouth every 6 (six) hours as needed for itching.     fentaNYL 25 MCG/HR patch  Commonly known as:  DURAGESIC - dosed mcg/hr  Place 1 patch (25 mcg total) onto the skin every 3 (three) days.     gabapentin 300 MG capsule  Commonly known as:  NEURONTIN  Take 300 mg by mouth 3 (three) times daily.     HYDROcodone-acetaminophen 5-325 MG per tablet  Commonly known as:  NORCO/VICODIN  Take 1 tablet by  mouth every 4 (four) hours as needed. And I po q4 scheduled     isosorbide mononitrate 30 MG 24 hr tablet  Commonly known as:  IMDUR  Take 15 mg by mouth daily.     lanthanum 500 MG chewable tablet  Commonly known as:  FOSRENOL  Chew 1 tablet (500 mg total) by mouth 3 (three) times daily with meals.     mirtazapine 7.5 MG tablet  Commonly known as:  REMERON  Take 7.5 mg by mouth at bedtime.     nitroGLYCERIN 0.4 MG SL tablet  Commonly known as:  NITROSTAT  Place 0.4 mg under the tongue every 5 (five) minutes as needed for chest pain.     omeprazole 20 MG capsule  Commonly known as:  PRILOSEC  Take 20 mg by mouth daily.     potassium chloride SA 20 MEQ tablet  Commonly known as:  K-DUR,KLOR-CON  Take 20 mEq by mouth daily.     vitamin C 500 MG tablet  Commonly known as:  ASCORBIC ACID  Take 500 mg by mouth daily.     zinc sulfate 220 MG capsule  Take 220 mg by mouth daily.        Meds ordered this encounter  Medications  . gabapentin (NEURONTIN) 300 MG capsule    Sig: Take 300 mg by mouth 3 (three) times daily.  [name]. b complex-vitamin c-folic acid (NEPHRO-VITE) 0.8 MG TABS tablet    Sig: Take 1 tablet by mouth daily.    Immunization History  Administered Date(s) Administered  . Pneumococcal Polysaccharide-23 08/21/2013  . Tdap 10/04/2013    History  Substance Use Topics  . Smoking status: Former Smoker -- 2.00 packs/day for 20 years    Types: Cigarettes    Quit date: 07/15/1996  . Smokeless tobacco: Former NeurosurgeonUser    Types: Snuff  . Alcohol Use: No    Filed Vitals:   02/15/14 2214  BP: 155/77  Pulse: 98  Temp: 97.4 F (36.3 C)  Resp: 22    Physical Exam  GENERAL APPEARANCE: Alert, conversant. Appropriately groomed. No acute distress.  HEENT: Unremarkable. RESPIRATORY: Breathing is even, unlabored. Lung sounds are clear   CARDIOVASCULAR: Heart RRR no murmurs, rubs or gallops. No peripheral edema.  GASTROINTESTINAL: Abdomen is soft, non-tender, not  distended w/ normal bowel sounds.  NEUROLOGIC: Cranial nerves 2-12 grossly intact. Moves all extremities no tremor.  Patient Active Problem List   Diagnosis Date Noted  . Chronic kidney disease, stage IV (  severe) 02/15/2014  . Sacral decubitus ulcer 02/04/2014  . Inadequate pain control 02/04/2014  . Gangrene of finger 02/04/2014  . S/P AKA (above knee amputation) 02/04/2014  . VTE (venous thromboembolism) 02/04/2014  . Protein-calorie malnutrition, severe 01/31/2014  . 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 Blindness of both eyes, impairment level not further specified 01/25/2014  . Malnutrition of moderate degree 01/24/2014  . Chest pain 01/23/2014  . UTI (lower urinary tract infection) 01/23/2014  . Visual loss, bilateral 01/23/2014  . Multifocal atrial tachycardia 01/14/2014  . ESRD on dialysis 01/13/2014  . Palliative care encounter 01/10/2014  . DM type 2, uncontrolled, with renal complications 01/01/2014  . Atherosclerotic PVD with ulceration 12/31/2013  . Acute on chronic systolic CHF (congestive heart failure) 08/17/2013  . Renal failure, acute on chronic stage 4 08/17/2013  . Anemia 08/17/2013  . Essential hypertension 03/30/2013  . S/P CABG x 5 03/02/2013  . Chronic systolic heart failure 03/02/2013  . Abnormal nuclear stress test 03/02/2013  . CKD (chronic kidney disease), stage IV 03/02/2013  . H/O epistaxis 03/02/2013  . COPD (chronic obstructive pulmonary disease) 03/02/2013  . OSA on CPAP 03/02/2013  . Peripheral vascular disease, unspecified 05/13/2012  . PVD (peripheral vascular disease) 10/29/2011    CBC    Component Value Date/Time   WBC 14.5* 02/02/2014 0500   RBC 3.75* 02/02/2014 0500   HGB 9.8* 02/02/2014 0500   HCT 31.4* 02/02/2014 0500   PLT 376 02/02/2014 0500   MCV 83.7 02/02/2014 0500   LYMPHSABS 1.1 01/23/2014 1139   MONOABS 0.6 01/23/2014 1139   EOSABS 0.2 01/23/2014 1139   BASOSABS 0.0 01/23/2014 1139    CMP     Component Value  Date/Time   NA 137 02/02/2014 0724   K 3.8 02/02/2014 0724   CL 95* 02/02/2014 0724   CO2 26 02/02/2014 0724   GLUCOSE 105* 02/02/2014 0724   BUN 28* 02/02/2014 0724   CREATININE 4.09* 02/02/2014 0724   CALCIUM 9.5 02/02/2014 0724   CALCIUM 8.7 07/01/2011 1831   PROT 7.1 01/23/2014 1139   ALBUMIN 2.0* 02/02/2014 0724   AST 32 01/23/2014 1139   ALT 15 01/23/2014 1139   ALKPHOS 134* 01/23/2014 1139   BILITOT 0.4 01/23/2014 1139   GFRNONAA 10* 02/02/2014 0724   GFRAA 12* 02/02/2014 0724    Assessment and Plan  Patient with multiple problems is improved and stable for discharge to home. Pt's daughter has stated she will be at home as well as other family members. She already has relationship with wound care and has been in communication with pt's PCP , Dr Juleen China, as have I. Both of [name] have some reservation about whether daughter can manage Mom at home but daughter does have a plan in place and is very determined that pt be d/c form SNF. Social workers have been working to get everything in place for pt in a very short time span.  Margit Hanks, MD

## 2014-02-23 ENCOUNTER — Encounter (HOSPITAL_BASED_OUTPATIENT_CLINIC_OR_DEPARTMENT_OTHER): Payer: Medicare Other | Attending: General Surgery

## 2014-02-24 ENCOUNTER — Encounter: Payer: Self-pay | Admitting: Vascular Surgery

## 2014-02-25 ENCOUNTER — Encounter (HOSPITAL_COMMUNITY): Payer: Self-pay

## 2014-02-25 ENCOUNTER — Other Ambulatory Visit (HOSPITAL_COMMUNITY): Payer: Self-pay

## 2014-02-25 ENCOUNTER — Ambulatory Visit: Payer: Self-pay | Admitting: Vascular Surgery

## 2014-03-10 ENCOUNTER — Telehealth: Payer: Self-pay | Admitting: Vascular Surgery

## 2014-03-10 NOTE — Telephone Encounter (Signed)
Dr Hart Rochester referred [name] to Dr Lajoyce Corners for her L 1st toe and also to Dr Merlyn Lot regarding her L 5th finger.  [name] did not show to her 05/15 appointment with Dr Lajoyce Corners [name] refused to schedule an appointment with Dr Merlyn Lot stating that she would call if she changed her mind.  FYI

## 2014-03-19 ENCOUNTER — Inpatient Hospital Stay (HOSPITAL_COMMUNITY)
Admission: EM | Admit: 2014-03-19 | Discharge: 2014-03-23 | DRG: 871 | Disposition: A | Discharge status: Home / Self Care | Payer: Medicare Other | Attending: Internal Medicine | Admitting: Internal Medicine

## 2014-03-19 ENCOUNTER — Emergency Department (HOSPITAL_COMMUNITY): Payer: Medicare Other

## 2014-03-19 ENCOUNTER — Encounter (HOSPITAL_COMMUNITY): Payer: Self-pay | Admitting: Emergency Medicine

## 2014-03-19 DIAGNOSIS — N2581 Secondary hyperparathyroidism of renal origin: Secondary | ICD-10-CM | POA: Diagnosis present

## 2014-03-19 DIAGNOSIS — K6289 Other specified diseases of anus and rectum: Secondary | ICD-10-CM | POA: Diagnosis present

## 2014-03-19 DIAGNOSIS — E1165 Type 2 diabetes mellitus with hyperglycemia: Secondary | ICD-10-CM | POA: Diagnosis present

## 2014-03-19 DIAGNOSIS — L8995 Pressure ulcer of unspecified site, unstageable: Secondary | ICD-10-CM | POA: Diagnosis present

## 2014-03-19 DIAGNOSIS — J4489 Other specified chronic obstructive pulmonary disease: Secondary | ICD-10-CM | POA: Diagnosis present

## 2014-03-19 DIAGNOSIS — N186 End stage renal disease: Secondary | ICD-10-CM | POA: Diagnosis present

## 2014-03-19 DIAGNOSIS — Z91041 Radiographic dye allergy status: Secondary | ICD-10-CM

## 2014-03-19 DIAGNOSIS — E785 Hyperlipidemia, unspecified: Secondary | ICD-10-CM | POA: Diagnosis present

## 2014-03-19 DIAGNOSIS — Z7902 Long term (current) use of antithrombotics/antiplatelets: Secondary | ICD-10-CM

## 2014-03-19 DIAGNOSIS — Z992 Dependence on renal dialysis: Secondary | ICD-10-CM

## 2014-03-19 DIAGNOSIS — L97509 Non-pressure chronic ulcer of other part of unspecified foot with unspecified severity: Secondary | ICD-10-CM | POA: Diagnosis present

## 2014-03-19 DIAGNOSIS — Z951 Presence of aortocoronary bypass graft: Secondary | ICD-10-CM

## 2014-03-19 DIAGNOSIS — L89154 Pressure ulcer of sacral region, stage 4: Secondary | ICD-10-CM

## 2014-03-19 DIAGNOSIS — Z87891 Personal history of nicotine dependence: Secondary | ICD-10-CM

## 2014-03-19 DIAGNOSIS — E43 Unspecified severe protein-calorie malnutrition: Secondary | ICD-10-CM

## 2014-03-19 DIAGNOSIS — L89609 Pressure ulcer of unspecified heel, unspecified stage: Secondary | ICD-10-CM | POA: Diagnosis present

## 2014-03-19 DIAGNOSIS — E1129 Type 2 diabetes mellitus with other diabetic kidney complication: Secondary | ICD-10-CM | POA: Diagnosis present

## 2014-03-19 DIAGNOSIS — I120 Hypertensive chronic kidney disease with stage 5 chronic kidney disease or end stage renal disease: Secondary | ICD-10-CM | POA: Diagnosis present

## 2014-03-19 DIAGNOSIS — A419 Sepsis, unspecified organism: Secondary | ICD-10-CM | POA: Diagnosis present

## 2014-03-19 DIAGNOSIS — Z89619 Acquired absence of unspecified leg above knee: Secondary | ICD-10-CM

## 2014-03-19 DIAGNOSIS — Z79899 Other long term (current) drug therapy: Secondary | ICD-10-CM

## 2014-03-19 DIAGNOSIS — IMO0002 Reserved for concepts with insufficient information to code with codable children: Secondary | ICD-10-CM | POA: Diagnosis present

## 2014-03-19 DIAGNOSIS — I2510 Atherosclerotic heart disease of native coronary artery without angina pectoris: Secondary | ICD-10-CM | POA: Diagnosis present

## 2014-03-19 DIAGNOSIS — M899 Disorder of bone, unspecified: Secondary | ICD-10-CM | POA: Diagnosis present

## 2014-03-19 DIAGNOSIS — R64 Cachexia: Secondary | ICD-10-CM | POA: Diagnosis present

## 2014-03-19 DIAGNOSIS — L8994 Pressure ulcer of unspecified site, stage 4: Secondary | ICD-10-CM | POA: Diagnosis present

## 2014-03-19 DIAGNOSIS — I509 Heart failure, unspecified: Secondary | ICD-10-CM | POA: Diagnosis present

## 2014-03-19 DIAGNOSIS — I5022 Chronic systolic (congestive) heart failure: Secondary | ICD-10-CM | POA: Diagnosis present

## 2014-03-19 DIAGNOSIS — I96 Gangrene, not elsewhere classified: Secondary | ICD-10-CM

## 2014-03-19 DIAGNOSIS — J449 Chronic obstructive pulmonary disease, unspecified: Secondary | ICD-10-CM | POA: Diagnosis present

## 2014-03-19 DIAGNOSIS — Z794 Long term (current) use of insulin: Secondary | ICD-10-CM

## 2014-03-19 DIAGNOSIS — L98499 Non-pressure chronic ulcer of skin of other sites with unspecified severity: Secondary | ICD-10-CM | POA: Diagnosis present

## 2014-03-19 DIAGNOSIS — N058 Unspecified nephritic syndrome with other morphologic changes: Secondary | ICD-10-CM | POA: Diagnosis present

## 2014-03-19 DIAGNOSIS — Z86718 Personal history of other venous thrombosis and embolism: Secondary | ICD-10-CM

## 2014-03-19 DIAGNOSIS — N390 Urinary tract infection, site not specified: Secondary | ICD-10-CM | POA: Diagnosis present

## 2014-03-19 DIAGNOSIS — I10 Essential (primary) hypertension: Secondary | ICD-10-CM

## 2014-03-19 DIAGNOSIS — I70269 Atherosclerosis of native arteries of extremities with gangrene, unspecified extremity: Secondary | ICD-10-CM | POA: Diagnosis present

## 2014-03-19 DIAGNOSIS — M949 Disorder of cartilage, unspecified: Secondary | ICD-10-CM

## 2014-03-19 DIAGNOSIS — I8290 Acute embolism and thrombosis of unspecified vein: Secondary | ICD-10-CM

## 2014-03-19 DIAGNOSIS — G9340 Encephalopathy, unspecified: Secondary | ICD-10-CM | POA: Diagnosis present

## 2014-03-19 DIAGNOSIS — Z681 Body mass index (BMI) 19 or less, adult: Secondary | ICD-10-CM

## 2014-03-19 DIAGNOSIS — S78119A Complete traumatic amputation at level between unspecified hip and knee, initial encounter: Secondary | ICD-10-CM

## 2014-03-19 DIAGNOSIS — I739 Peripheral vascular disease, unspecified: Secondary | ICD-10-CM | POA: Diagnosis present

## 2014-03-19 DIAGNOSIS — L89159 Pressure ulcer of sacral region, unspecified stage: Secondary | ICD-10-CM | POA: Diagnosis present

## 2014-03-19 DIAGNOSIS — T148XXA Other injury of unspecified body region, initial encounter: Secondary | ICD-10-CM

## 2014-03-19 DIAGNOSIS — L89109 Pressure ulcer of unspecified part of back, unspecified stage: Secondary | ICD-10-CM | POA: Diagnosis present

## 2014-03-19 DIAGNOSIS — L089 Local infection of the skin and subcutaneous tissue, unspecified: Secondary | ICD-10-CM

## 2014-03-19 DIAGNOSIS — D649 Anemia, unspecified: Secondary | ICD-10-CM | POA: Diagnosis present

## 2014-03-19 DIAGNOSIS — Z66 Do not resuscitate: Secondary | ICD-10-CM | POA: Diagnosis present

## 2014-03-19 LAB — CBC WITH DIFFERENTIAL/PLATELET
BASOS PCT: 0 % (ref 0–1)
Basophils Absolute: 0 10*3/uL (ref 0.0–0.1)
EOS PCT: 0 % (ref 0–5)
Eosinophils Absolute: 0 10*3/uL (ref 0.0–0.7)
HEMATOCRIT: 30.8 % — AB (ref 36.0–46.0)
Hemoglobin: 9.7 g/dL — ABNORMAL LOW (ref 12.0–15.0)
Lymphocytes Relative: 6 % — ABNORMAL LOW (ref 12–46)
Lymphs Abs: 1 10*3/uL (ref 0.7–4.0)
MCH: 26.4 pg (ref 26.0–34.0)
MCHC: 31.5 g/dL (ref 30.0–36.0)
MCV: 83.7 fL (ref 78.0–100.0)
MONOS PCT: 5 % (ref 3–12)
Monocytes Absolute: 0.8 10*3/uL (ref 0.1–1.0)
NEUTROS ABS: 14.5 10*3/uL — AB (ref 1.7–7.7)
Neutrophils Relative %: 89 % — ABNORMAL HIGH (ref 43–77)
Platelets: 240 10*3/uL (ref 150–400)
RBC: 3.68 MIL/uL — AB (ref 3.87–5.11)
RDW: 26.1 % — ABNORMAL HIGH (ref 11.5–15.5)
WBC: 16.3 10*3/uL — AB (ref 4.0–10.5)

## 2014-03-19 LAB — URINE MICROSCOPIC-ADD ON

## 2014-03-19 LAB — URINALYSIS, ROUTINE W REFLEX MICROSCOPIC
Glucose, UA: NEGATIVE mg/dL
Ketones, ur: 15 mg/dL — AB
Nitrite: POSITIVE — AB
Protein, ur: 100 mg/dL — AB
SPECIFIC GRAVITY, URINE: 1.031 — AB (ref 1.005–1.030)
UROBILINOGEN UA: 1 mg/dL (ref 0.0–1.0)
pH: 5 (ref 5.0–8.0)

## 2014-03-19 LAB — BASIC METABOLIC PANEL
BUN: 13 mg/dL (ref 6–23)
CHLORIDE: 100 meq/L (ref 96–112)
CO2: 29 mEq/L (ref 19–32)
Calcium: 8.7 mg/dL (ref 8.4–10.5)
Creatinine, Ser: 1.41 mg/dL — ABNORMAL HIGH (ref 0.50–1.10)
GFR calc Af Amer: 42 mL/min — ABNORMAL LOW (ref >90)
GFR calc non Af Amer: 36 mL/min — ABNORMAL LOW (ref >90)
Glucose, Bld: 149 mg/dL — ABNORMAL HIGH (ref 70–99)
POTASSIUM: 3 meq/L — AB (ref 3.7–5.3)
Sodium: 143 mEq/L (ref 137–147)

## 2014-03-19 LAB — I-STAT CG4 LACTIC ACID, ED: LACTIC ACID, VENOUS: 2.06 mmol/L (ref 0.5–2.2)

## 2014-03-19 MED ORDER — SODIUM CHLORIDE 0.9 % IV SOLN
INTRAVENOUS | Status: DC
  Administered 2014-03-19: 22:00:00 via INTRAVENOUS

## 2014-03-19 MED ORDER — DEXTROSE 5 % IV SOLN: 1.0000 g | INTRAVENOUS | Status: DC

## 2014-03-19 NOTE — ED Notes (Signed)
Pt family states that sores and fingers have been like they are since April.  Pt family  States pt was admitted last week at [facility] for dialysis and other stuff.  Pt is alert and able to state her name on arrival to ED

## 2014-03-19 NOTE — ED Provider Notes (Signed)
CSN: [id number]     Arrival date & time 03/19/14  2143 History   First MD Initiated Contact with Patient 03/19/14 2200     No chief complaint on file.    (Consider location/radiation/quality/duration/timing/severity/associated sxs/prior Treatment) The history is provided by a relative and the patient.   Patient here due 2 having increased weakness after dialysis with altered mental status. She is a DO NOT RESUSCITATE. Patient came up from dialysis today and her diagnosis she is not been as responsive. No reported fever, vomiting, diarrhea. EMS was called and blood sugar was above 100. Patient's mental status has gradually improved. She is coming back more to her baseline. No reported seizure activity. No treatment used prior to arrival. Nothing made her symptoms better or worse. Past Medical History  Diagnosis Date  . CAD (coronary artery disease)   . Hypertension   . Diabetes mellitus     type 2 insulin dependent  . Hyperlipidemia   . CHF (congestive heart failure)   . Cellulitis     left foot  . Ulcer     diabetic ulcer on left foot  . DVT (deep venous thrombosis)     peroneal vein  . Peripheral vascular disease   . ESRD on hemodialysis   . Anemia   . Insomnia    Past Surgical History  Procedure Laterality Date  . Tubal ligation    . Pr vein bypass graft,aorto-fem-pop  06-26-11    1. PATENT LEFT FEMORAL-POPLITEAL BYPASS GRAFT W/NO EVEIDENCE OF STENOSIS. 2.VELOCITIES OF GREATER THAN 200 CM/'s NOTED ON PREVIOUS EXAM 10/03/11 WERE NOT ADEQUATELY VISULAIZED DURING THIS EXAM  . Coronary artery bypass graft  04/25/08    Dr. Tyrone Sage, CABG x 5   . Nm myoview ltd  01/13/13    LEXISCAN; LV WALL MOTION: LVEF 44%, INFERIOR AKINESIS, ANTEROAPICAL HYPOKINESIS  . Cardiac catheterization  04/22/08    SEVERE 3-VESSEL DISEASE CAD., CABG X 5 04/25/08 WITH DR. TDSKAJGO  . Av fistula placement Left 09/07/2013    Procedure: ARTERIOVENOUS (AV) FISTULA CREATION;  Surgeon: Sherren Kerns, MD;   Location: [facility] OR;  Service: Vascular;  Laterality: Left;  . Ligation arteriovenous gortex graft Left 01/09/2014    Procedure: LIGATION ARTERIOVENOUS GORTEX GRAFT;  Surgeon: Sherren Kerns, MD;  Location: [facility] OR;  Service: Vascular;  Laterality: Left;  . Insertion of dialysis catheter Right 01/09/2014    Procedure: INSERTION OF DIALYSIS CATHETER;  Surgeon: Sherren Kerns, MD;  Location: [facility] OR;  Service: Vascular;  Laterality: Right;  Insertion of diatek to right internal Jugular artery.  . Amputation Right 01/12/2014    Procedure: AMPUTATION ABOVE KNEE- RIGHT;  Surgeon: Pryor Ochoa, MD;  Location: [facility] OR;  Service: Vascular;  Laterality: Right;  . 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 Cancer Mother     Kidney  . Kidney disease Mother   . Cancer Father     skin   History  Substance Use Topics  . Smoking status: Former Smoker -- 2.00 packs/day for 20 years    Types: Cigarettes    Quit date: 07/15/1996  . Smokeless tobacco: Former Neurosurgeon    Types: Snuff  . Alcohol Use: No   OB History   Grav Para Term Preterm Abortions TAB SAB Ect Mult Living                 Review of Systems  All other systems reviewed and are negative.  Allergies  Ivp dye and Aspirin  Home Medications   Prior to Admission medications   Medication Sig Start Date End Date Taking? Authorizing Provider  acetaminophen (TYLENOL) 650 MG CR tablet Take 650 mg by mouth every 8 (eight) hours as needed for pain.    Historical Provider, MD  amLODipine (NORVASC) 5 MG tablet Take 10 mg by mouth daily.     Historical Provider, MD  atorvastatin (LIPITOR) 40 MG tablet Take 40 mg by mouth daily.     Historical Provider, MD  b complex-vitamin c-folic acid (NEPHRO-VITE) 0.8 MG TABS tablet Take 1 tablet by mouth daily.    Historical Provider, MD  calcitRIOL (ROCALTROL) 0.25 MCG capsule Take 0.5 mcg by mouth daily.    Historical Provider, MD  clopidogrel (PLAVIX) 75 MG  tablet Take 1 tablet (75 mg total) by mouth daily. 02/02/14   Clydia LlanoMutaz Elmahi, MD  collagenase (SANTYL) ointment Apply topically daily. 02/02/14   Clydia LlanoMutaz Elmahi, MD  diphenhydrAMINE (BENADRYL) 25 MG tablet Take 25 mg by mouth every 6 (six) hours as needed for itching.    Historical Provider, MD  fentaNYL (DURAGESIC - DOSED MCG/HR) 25 MCG/HR patch Place 1 patch (25 mcg total) onto the skin every 3 (three) days. 02/02/14   Clydia LlanoMutaz Elmahi, MD  gabapentin (NEURONTIN) 300 MG capsule Take 300 mg by mouth 3 (three) times daily.    Historical Provider, MD  HYDROcodone-acetaminophen (NORCO/VICODIN) 5-325 MG per tablet Take 1 tablet by mouth every 4 (four) hours as needed. And I po q4 scheduled    Historical Provider, MD  isosorbide mononitrate (IMDUR) 30 MG 24 hr tablet Take 15 mg by mouth daily.    Historical Provider, MD  lanthanum (FOSRENOL) 500 MG chewable tablet Chew 1 tablet (500 mg total) by mouth 3 (three) times daily with meals. 01/15/14   Catarina Hartshornavid Tat, MD  mirtazapine (REMERON) 7.5 MG tablet Take 7.5 mg by mouth at bedtime.    Historical Provider, MD  nitroGLYCERIN (NITROSTAT) 0.4 MG SL tablet Place 0.4 mg under the tongue every 5 (five) minutes as needed for chest pain.    Historical Provider, MD  omeprazole (PRILOSEC) 20 MG capsule Take 20 mg by mouth daily.     Historical Provider, MD  potassium chloride SA (K-DUR,KLOR-CON) 20 MEQ tablet Take 20 mEq by mouth daily.    Historical Provider, MD  vitamin C (ASCORBIC ACID) 500 MG tablet Take 500 mg by mouth daily.    Historical Provider, MD  zinc sulfate 220 MG capsule Take 220 mg by mouth daily.    Historical Provider, MD   BP 144/98  Pulse 102  Temp(Src) 99.1 F (37.3 C) (Oral)  Resp 22  Wt 100 lb 14.4 oz (45.768 kg)  SpO2 100% Physical Exam  Nursing note and vitals reviewed. Constitutional: She is oriented to person, place, and time. She appears lethargic. She appears cachectic.  Non-toxic appearance. She has a sickly appearance. She appears ill. No  distress.    HENT:  Head: Normocephalic and atraumatic.  Eyes: Conjunctivae, EOM and lids are normal. Pupils are equal, round, and reactive to light.  Neck: Normal range of motion. Neck supple. No tracheal deviation present. No mass present.  Cardiovascular: Normal rate, regular rhythm and normal heart sounds.  Exam reveals no gallop.   No murmur heard. Pulmonary/Chest: Effort normal and breath sounds normal. No stridor. No respiratory distress. She has no decreased breath sounds. She has no wheezes. She has no rhonchi. She has no rales.  Abdominal: Soft. Normal appearance  and bowel sounds are normal. She exhibits no distension. There is no tenderness. There is no rebound and no CVA tenderness.  Musculoskeletal: Normal range of motion. She exhibits no edema and no tenderness.  Neurological: She is oriented to person, place, and time. She appears lethargic. She displays no tremor. No cranial nerve deficit or sensory deficit. GCS eye subscore is 4. GCS verbal subscore is 5. GCS motor subscore is 6.  Skin: Skin is warm and dry. No abrasion and no rash noted.  Psychiatric: Her affect is blunt. Her speech is delayed. She is slowed.    ED Course  Procedures (including critical care time) Labs Review Labs Reviewed  URINE CULTURE  CULTURE, BLOOD (ROUTINE X 2)  CULTURE, BLOOD (ROUTINE X 2)  URINALYSIS, ROUTINE W REFLEX MICROSCOPIC  CBC WITH DIFFERENTIAL  BASIC METABOLIC PANEL  I-STAT CG4 LACTIC ACID, ED    Imaging Review No results found.   EKG Interpretation None      MDM   Final diagnoses:  None     Date: 03/19/2014  Rate: 100   Rhythm: sinus tachycardia  QRS Axis: normal  Intervals: normal  ST/T Wave abnormalities: nonspecific ST changes  Conduction Disutrbances:right bundle branch block  Narrative Interpretation:   Old EKG Reviewed: none available  ` Patient given Cipro Floxin for urinary tract infection will be admitted    Toy Baker, MD 03/20/14 0001

## 2014-03-19 NOTE — ED Notes (Signed)
Pt family states pt is less responsive than normal

## 2014-03-19 NOTE — ED Notes (Signed)
Unable to draw second set of blood culture.

## 2014-03-19 NOTE — ED Notes (Signed)
MD at bedside. 

## 2014-03-20 DIAGNOSIS — E1165 Type 2 diabetes mellitus with hyperglycemia: Secondary | ICD-10-CM

## 2014-03-20 DIAGNOSIS — L8994 Pressure ulcer of unspecified site, stage 4: Secondary | ICD-10-CM

## 2014-03-20 DIAGNOSIS — Z951 Presence of aortocoronary bypass graft: Secondary | ICD-10-CM

## 2014-03-20 DIAGNOSIS — Z992 Dependence on renal dialysis: Secondary | ICD-10-CM

## 2014-03-20 DIAGNOSIS — E1129 Type 2 diabetes mellitus with other diabetic kidney complication: Secondary | ICD-10-CM

## 2014-03-20 DIAGNOSIS — L089 Local infection of the skin and subcutaneous tissue, unspecified: Secondary | ICD-10-CM | POA: Diagnosis present

## 2014-03-20 DIAGNOSIS — I749 Embolism and thrombosis of unspecified artery: Secondary | ICD-10-CM

## 2014-03-20 DIAGNOSIS — G9340 Encephalopathy, unspecified: Secondary | ICD-10-CM | POA: Diagnosis present

## 2014-03-20 DIAGNOSIS — N390 Urinary tract infection, site not specified: Secondary | ICD-10-CM

## 2014-03-20 DIAGNOSIS — I5022 Chronic systolic (congestive) heart failure: Secondary | ICD-10-CM

## 2014-03-20 DIAGNOSIS — I10 Essential (primary) hypertension: Secondary | ICD-10-CM

## 2014-03-20 DIAGNOSIS — S78119A Complete traumatic amputation at level between unspecified hip and knee, initial encounter: Secondary | ICD-10-CM

## 2014-03-20 DIAGNOSIS — L89109 Pressure ulcer of unspecified part of back, unspecified stage: Secondary | ICD-10-CM

## 2014-03-20 DIAGNOSIS — T148XXA Other injury of unspecified body region, initial encounter: Secondary | ICD-10-CM

## 2014-03-20 DIAGNOSIS — N186 End stage renal disease: Secondary | ICD-10-CM

## 2014-03-20 DIAGNOSIS — I739 Peripheral vascular disease, unspecified: Secondary | ICD-10-CM

## 2014-03-20 LAB — C-REACTIVE PROTEIN: CRP: 11 mg/dL — ABNORMAL HIGH

## 2014-03-20 LAB — CBC
HCT: 31.8 % — ABNORMAL LOW (ref 36.0–46.0)
Hemoglobin: 9.7 g/dL — ABNORMAL LOW (ref 12.0–15.0)
MCH: 25.9 pg — ABNORMAL LOW (ref 26.0–34.0)
MCHC: 30.5 g/dL (ref 30.0–36.0)
MCV: 85 fL (ref 78.0–100.0)
PLATELETS: 243 10*3/uL (ref 150–400)
RBC: 3.74 MIL/uL — ABNORMAL LOW (ref 3.87–5.11)
RDW: 26.5 % — AB (ref 11.5–15.5)
WBC: 16.3 10*3/uL — ABNORMAL HIGH (ref 4.0–10.5)

## 2014-03-20 LAB — SEDIMENTATION RATE: Sed Rate: 35 mm/hr — ABNORMAL HIGH (ref 0–22)

## 2014-03-20 LAB — GLUCOSE, CAPILLARY: GLUCOSE-CAPILLARY: 145 mg/dL — AB (ref 70–99)

## 2014-03-20 LAB — MRSA PCR SCREENING: MRSA by PCR: NEGATIVE

## 2014-03-20 LAB — COMPREHENSIVE METABOLIC PANEL
ALK PHOS: 130 U/L — AB (ref 39–117)
ALT: 11 U/L (ref 0–35)
AST: 17 U/L (ref 0–37)
Albumin: 1.7 g/dL — ABNORMAL LOW (ref 3.5–5.2)
BILIRUBIN TOTAL: 0.8 mg/dL (ref 0.3–1.2)
BUN: 15 mg/dL (ref 6–23)
CHLORIDE: 98 meq/L (ref 96–112)
CO2: 25 meq/L (ref 19–32)
CREATININE: 1.6 mg/dL — AB (ref 0.50–1.10)
Calcium: 8.7 mg/dL (ref 8.4–10.5)
GFR calc Af Amer: 36 mL/min — ABNORMAL LOW (ref >90)
GFR, EST NON AFRICAN AMERICAN: 31 mL/min — AB (ref >90)
Glucose, Bld: 167 mg/dL — ABNORMAL HIGH (ref 70–99)
POTASSIUM: 3.4 meq/L — AB (ref 3.7–5.3)
Sodium: 142 mEq/L (ref 137–147)
Total Protein: 6.2 g/dL (ref 6.0–8.3)

## 2014-03-20 LAB — PROTIME-INR
INR: 1.42 (ref 0.00–1.49)
PROTHROMBIN TIME: 17 s — AB (ref 11.6–15.2)

## 2014-03-20 MED ORDER — CLOPIDOGREL BISULFATE 75 MG PO TABS
75.0000 mg | ORAL_TABLET | Freq: Every day | ORAL | Status: DC
  Administered 2014-03-20 – 2014-03-23 (×4): 75 mg via ORAL
  Filled 2014-03-20 (×4): qty 1

## 2014-03-20 MED ORDER — MORPHINE SULFATE 2 MG/ML IJ SOLN
1.0000 mg | Freq: Once | INTRAMUSCULAR | Status: AC
  Administered 2014-03-20: 1 mg via INTRAVENOUS
  Filled 2014-03-20: qty 1

## 2014-03-20 MED ORDER — OXYCODONE HCL 5 MG PO TABS
10.0000 mg | ORAL_TABLET | ORAL | Status: DC | PRN
  Administered 2014-03-23 (×2): 10 mg via ORAL
  Filled 2014-03-20 (×2): qty 2

## 2014-03-20 MED ORDER — VANCOMYCIN HCL IN DEXTROSE 1-5 GM/200ML-% IV SOLN
1000.0000 mg | Freq: Once | INTRAVENOUS | Status: AC
  Administered 2014-03-20: 1000 mg via INTRAVENOUS
  Filled 2014-03-20: qty 200

## 2014-03-20 MED ORDER — PANTOPRAZOLE SODIUM 40 MG PO TBEC
40.0000 mg | DELAYED_RELEASE_TABLET | Freq: Every day | ORAL | Status: DC
  Administered 2014-03-20 – 2014-03-23 (×4): 40 mg via ORAL
  Filled 2014-03-20 (×4): qty 1

## 2014-03-20 MED ORDER — LANTHANUM CARBONATE 500 MG PO CHEW
500.0000 mg | CHEWABLE_TABLET | Freq: Three times a day (TID) | ORAL | Status: DC
  Administered 2014-03-20 – 2014-03-23 (×7): 500 mg via ORAL
  Filled 2014-03-20 (×13): qty 1

## 2014-03-20 MED ORDER — MIRTAZAPINE 7.5 MG PO TABS
7.5000 mg | ORAL_TABLET | Freq: Every day | ORAL | Status: DC
  Administered 2014-03-20 – 2014-03-22 (×4): 7.5 mg via ORAL
  Filled 2014-03-20 (×5): qty 1

## 2014-03-20 MED ORDER — ISOSORBIDE MONONITRATE 15 MG HALF TABLET
15.0000 mg | ORAL_TABLET | Freq: Every day | ORAL | Status: DC
  Administered 2014-03-20 – 2014-03-23 (×4): 15 mg via ORAL
  Filled 2014-03-20 (×4): qty 1

## 2014-03-20 MED ORDER — ONDANSETRON HCL 4 MG/2ML IJ SOLN: 4.0000 mg | Freq: Four times a day (QID) | INTRAMUSCULAR | Status: DC | PRN

## 2014-03-20 MED ORDER — HYDROCODONE-ACETAMINOPHEN 5-325 MG PO TABS: 1.0000 | ORAL_TABLET | ORAL | Status: DC | PRN

## 2014-03-20 MED ORDER — VANCOMYCIN HCL 500 MG IV SOLR
500.0000 mg | INTRAVENOUS | Status: DC
  Administered 2014-03-22: 500 mg via INTRAVENOUS
  Filled 2014-03-20 (×2): qty 500

## 2014-03-20 MED ORDER — CIPROFLOXACIN IN D5W 400 MG/200ML IV SOLN
400.0000 mg | Freq: Once | INTRAVENOUS | Status: AC
  Administered 2014-03-20: 400 mg via INTRAVENOUS
  Filled 2014-03-20: qty 200

## 2014-03-20 MED ORDER — ATORVASTATIN CALCIUM 40 MG PO TABS
40.0000 mg | ORAL_TABLET | Freq: Every day | ORAL | Status: DC
  Administered 2014-03-20 – 2014-03-23 (×4): 40 mg via ORAL
  Filled 2014-03-20 (×4): qty 1

## 2014-03-20 MED ORDER — GABAPENTIN 300 MG PO CAPS
300.0000 mg | ORAL_CAPSULE | Freq: Three times a day (TID) | ORAL | Status: DC
  Administered 2014-03-20 – 2014-03-23 (×8): 300 mg via ORAL
  Filled 2014-03-20 (×12): qty 1

## 2014-03-20 MED ORDER — RENA-VITE PO TABS
1.0000 | ORAL_TABLET | Freq: Every day | ORAL | Status: DC
  Administered 2014-03-20: 1 via ORAL
  Filled 2014-03-20 (×2): qty 1

## 2014-03-20 MED ORDER — NITROGLYCERIN 0.4 MG SL SUBL: 0.4000 mg | SUBLINGUAL_TABLET | SUBLINGUAL | Status: DC | PRN

## 2014-03-20 MED ORDER — AMLODIPINE BESYLATE 10 MG PO TABS
10.0000 mg | ORAL_TABLET | Freq: Every day | ORAL | Status: DC
  Administered 2014-03-20 – 2014-03-23 (×4): 10 mg via ORAL
  Filled 2014-03-20 (×4): qty 1

## 2014-03-20 MED ORDER — BIOTENE DRY MOUTH MT LIQD
15.0000 mL | Freq: Two times a day (BID) | OROMUCOSAL | Status: DC
  Administered 2014-03-20 – 2014-03-22 (×5): 15 mL via OROMUCOSAL

## 2014-03-20 MED ORDER — ONDANSETRON HCL 4 MG PO TABS: 4.0000 mg | ORAL_TABLET | Freq: Four times a day (QID) | ORAL | Status: DC | PRN

## 2014-03-20 MED ORDER — DARBEPOETIN ALFA-POLYSORBATE 60 MCG/0.3ML IJ SOLN
60.0000 ug | INTRAMUSCULAR | Status: DC
  Administered 2014-03-22: 60 ug via INTRAVENOUS
  Filled 2014-03-20: qty 0.3

## 2014-03-20 MED ORDER — FENTANYL 25 MCG/HR TD PT72: 25.0000 ug | MEDICATED_PATCH | TRANSDERMAL | Status: DC

## 2014-03-20 MED ORDER — CIPROFLOXACIN IN D5W 400 MG/200ML IV SOLN
400.0000 mg | INTRAVENOUS | Status: DC
Start: 2014-03-20 — End: 2014-03-23
  Administered 2014-03-20 – 2014-03-22 (×3): 400 mg via INTRAVENOUS
  Filled 2014-03-20 (×4): qty 200

## 2014-03-20 MED ORDER — TRAMADOL HCL 50 MG PO TABS
50.0000 mg | ORAL_TABLET | Freq: Four times a day (QID) | ORAL | Status: DC | PRN
  Administered 2014-03-20 – 2014-03-22 (×2): 50 mg via ORAL
  Filled 2014-03-20 (×3): qty 1

## 2014-03-20 MED ORDER — CALCITRIOL 0.5 MCG PO CAPS
0.5000 ug | ORAL_CAPSULE | Freq: Every day | ORAL | Status: DC
  Administered 2014-03-20: 0.5 ug via ORAL
  Filled 2014-03-20: qty 1

## 2014-03-20 MED ORDER — HEPARIN SODIUM (PORCINE) 5000 UNIT/ML IJ SOLN
5000.0000 [IU] | Freq: Three times a day (TID) | INTRAMUSCULAR | Status: DC
  Administered 2014-03-20 – 2014-03-23 (×10): 5000 [IU] via SUBCUTANEOUS
  Filled 2014-03-20 (×13): qty 1

## 2014-03-20 NOTE — Progress Notes (Signed)
[facility] PROGRESS NOTE  [name] AOZ:[id number] DOB: May 03, 1941 DOA: 03/19/2014  PCP: Michiel Sites, MD  Brief HPI: [name] is a 73 y.o. female with a past medical history of coronary artery disease, hypertension, diabetes mellitus, CHF, DVT history, ESRD on hemodialysis, AKA, peripheral vascular disease with multiple decubiti ulcer and multiple digits involving dry gangrene following with vascular surgery and wound care. Patient presented with altered mental status after undergoing dialysis. Patient was admitted in a nursing home after her recent hospitalization in April and was discharged last month from the nursing home. She then had hospitalization at [facility] in end of May. She was briefly on hospice but patient expresed a wish to continue HD and so was discharged from hospice.  Past medical history:  Past Medical History  Diagnosis Date  . CAD (coronary artery disease)   . Hypertension   . Diabetes mellitus     type 2 insulin dependent  . Hyperlipidemia   . CHF (congestive heart failure)   . Cellulitis     left foot  . Ulcer     diabetic ulcer on left foot  . DVT (deep venous thrombosis)     peroneal vein  . Peripheral vascular disease   . ESRD on hemodialysis   . Anemia   . Insomnia     Consultants: Nephrology  Procedures: HD TTS  Antibiotics: Vanc 6/7 Cipro 6/7  Subjective: Patient somewhat lethargic but arousable. States she is in pain. But unable to specify.   Objective: Vital Signs  Filed Vitals:   03/19/14 2230 03/20/14 0015 03/20/14 0124 03/20/14 0557  BP: 164/104  155/98 168/86  Pulse:  91 98 78  Temp:   98.1 F (36.7 C) 97.6 F (36.4 C)  TempSrc:      Resp: 15 17 20 18   Weight:   49.8 kg (109 lb 12.6 oz)   SpO2:  100% 100% 100%    Intake/Output Summary (Last 24 hours) at 03/20/14 1035 Last data filed at 03/19/14 2233  Gross per 24 hour  Intake      0 ml  Output      5 ml  Net     -5 ml   Filed Weights   03/19/14 2147 03/20/14 0124  Weight: 45.768 kg (100 lb 14.4 oz) 49.8 kg (109 lb 12.6 oz)    General appearance: distracted, no distress and slowed mentation Head: Normocephalic, without obvious abnormality, atraumatic Resp: clear to auscultation bilaterally Cardio: regular rate and rhythm, S1, S2 normal, no murmur, click, rub or gallop GI: soft, non-tender; bowel sounds normal; no masses,  no organomegaly Extremities: right AKA. Mild edema noted LEft LE. Multiple dry gangrenes involving fingers of left hand Skin: Multiple areas of necrosis noted all over. especially fingers, abdomen, left toes, back. left thigh. Sacral decube also noted. Neurologic: Grossly normal  Lab Results:  Basic Metabolic Panel:  Recent Labs Lab 03/19/14 2253 03/20/14 0314  NA 143 142  K 3.0* 3.4*  CL 100 98  CO2 29 25  GLUCOSE 149* 167*  BUN 13 15  CREATININE 1.41* 1.60*  CALCIUM 8.7 8.7   Liver Function Tests:  Recent Labs Lab 03/20/14 0314  AST 17  ALT 11  ALKPHOS 130*  BILITOT 0.8  PROT 6.2  ALBUMIN 1.7*   CBC:  Recent Labs Lab 03/19/14 2253 03/20/14 0314  WBC 16.3* 16.3*  NEUTROABS 14.5*  --   HGB 9.7* 9.7*  HCT 30.8* 31.8*  MCV 83.7 85.0  PLT 240  243   BNP (last 3 results)  Recent Labs  08/17/13 1603 12/22/13 1809 01/23/14 1140  PROBNP 28521.0* >70000.0* >70000.0*   CBG:  Recent Labs Lab 03/20/14 0126  GLUCAP 145*    Recent Results (from the past 240 hour(s))  MRSA PCR SCREENING     Status: None   Collection Time    03/20/14  1:04 AM      Result Value Ref Range Status   MRSA by PCR NEGATIVE  NEGATIVE Final   Comment:            The GeneXpert MRSA Assay (FDA     approved for NASAL specimens     only), is one component of a     comprehensive MRSA colonization     surveillance program. It is not     intended to diagnose MRSA     infection nor to guide or     monitor treatment for     MRSA infections.      Studies/Results: Dg Chest 2 View  03/19/2014    CLINICAL DATA:  Diarrhea, rectal pain, history of CHF.  EXAM: CHEST  2 VIEW  COMPARISON:  Chest radiograph January 23, 2014.  FINDINGS: Cardiac silhouette is mildly enlarged, similar. Mildly calcified aortic knob. Status post median sternotomy for CABG. Similar COPD with biapical scarring/ bullous changes and pleural thickening. Small right pleural effusion. No pneumothorax.  Tunneled dialysis catheter via right internal jugular venous approach with distal tip projecting cavoatrial junction. Mild degenerative change of the shoulders and thoracic spine. Severe calcification of the abdominal vessels partially imaged.  IMPRESSION: Mild cardiomegaly, COPD with small right pleural effusion.   Electronically Signed   By: Awilda Metro   On: 03/19/2014 23:41   Ct Head Wo Contrast  03/19/2014   CLINICAL DATA:  Less responsive than normal.  EXAM: CT HEAD WITHOUT CONTRAST  TECHNIQUE: Contiguous axial images were obtained from the base of the skull through the vertex without intravenous contrast.  COMPARISON:  CT of the head January 23, 2014.  FINDINGS: The ventricles and sulci are normal for age. No intraparenchymal hemorrhage, mass effect nor midline shift. Confluent supratentorial white matter hypodensities are nonspecific though suggest sequelae of chronic small vessel ischemic disease. No acute large vascular territory infarcts. Remote right caudate lacunar infarct.  No abnormal extra-axial fluid collections. Basal cisterns are patent. Severe calcific atherosclerosis of the carotid siphons.  No skull fracture. The included ocular globes and orbital contents are non-suspicious. Fluid filled mildly expanded sella, unchanged. The mastoid aircells and included paranasal sinuses are well-aerated. Patient is edentulous. Marked scalp calcifications.  IMPRESSION: No acute intracranial process.  Moderate global brain atrophy with moderate to severe white matter changes suggesting chronic small vessel ischemic disease. Remote  right caudate lacunar infarct.   Electronically Signed   By: Awilda Metro   On: 03/19/2014 23:30    Medications:  Scheduled: . amLODipine  10 mg Oral Daily  . antiseptic oral rinse  15 mL Mouth Rinse BID  . atorvastatin  40 mg Oral Daily  . calcitRIOL  0.5 mcg Oral Daily  . ciprofloxacin  400 mg Intravenous Q24H  . clopidogrel  75 mg Oral Daily  . gabapentin  300 mg Oral TID  . heparin  5,000 Units Subcutaneous 3 times per day  . isosorbide mononitrate  15 mg Oral Daily  . lanthanum  500 mg Oral TID WC  . mirtazapine  7.5 mg Oral QHS  . pantoprazole  40 mg Oral Daily  . [  START ON 03/22/2014] vancomycin  500 mg Intravenous Q T,Th,Sa-HD   Continuous: . sodium chloride 20 mL/hr at 03/19/14 2219   ZOX:[id number]:HYDROcodone-acetaminophen, nitroGLYCERIN, ondansetron (ZOFRAN) IV, ondansetron  Assessment/Plan:  Principal Problem:   Acute encephalopathy Active Problems:   Peripheral vascular disease, unspecified   S/P CABG x 5   Chronic systolic heart failure   COPD (chronic obstructive pulmonary disease)   Essential hypertension   Anemia   DM type 2, uncontrolled, with renal complications   UTI (lower urinary tract infection)   Sacral decubitus ulcer   Gangrene of finger   S/P AKA (above knee amputation)   VTE (venous thromboembolism)    Acute encephalopathy  Probably due to UTI but patient potentially has multiple other reasons to be confused including sepsis, medications. No neurological deficits noted.   UTI She still makes urine and UA was abnormal. Culture is pending. Continue Cipro.   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 She was supposed to f/u with Dr. Lajoyce Cornersuda and Dr. Merlyn LotKuzma for amputations but per daughter they are thinking about it due to risk of surgery. She has very poor prognosis considering rapid worsening of these lesions. There is no good option for her. [facility] felt the same way.  Daughter understands. She doesn't want us to aggressively pursue this issue currently.  Possible Infected Decubitus ulcer  On Vanc. Wound care.  ESRD on HD  Will notify Nephrology. Patient wants to continue HD per daughter. This prevents her from going under hospice care. Daughter understands her poor prognosis and wants to continue HD till Nephrology states otherwise. Unclear if she is MWF or TTS.Per notes she was dialysed yesterday, but unclear.   Hypertension  Continue home antihypertensive medications.   Daughter not interested in speaking with palliative medicine. She understand her mother has poor prognosis and may benefit from being on hospice. But as long as patient wants dialysis she wants to respect her wishes. She will like for her to come back home as soon as she is able to.  Code Status: DNR/DNI  DVT Prophylaxis: Heparin    Family Communication: Discussed with her daughter who is also her Medical POA,  [name] [phone]. Disposition Plan: Will return home with daughter when she is medically ready.    LOS: 1 day   Osvaldo ShipperGokul Vilma Will  [facility] Pager [phone] 03/20/2014, 10:35 AM  If 8PM-8AM, please contact night-coverage at [email or web address], password [email or web address]   Disclaimer: This note was dictated with voice recognition software. Similar sounding words can inadvertently be transcribed and may not be corrected upon review.

## 2014-03-20 NOTE — Progress Notes (Signed)
ANTIBIOTIC CONSULT NOTE - INITIAL  Pharmacy Consult for vancomycin and ciprofloxacin  Indication: cellulitis and uti   Allergies  Allergen Reactions  . Ivp Dye [Iodinated Diagnostic Agents] Hives and Rash  . Aspirin Hives    Patient Measurements: Weight: 109 lb 12.6 oz (49.8 kg) Adjusted Body Weight:   Vital Signs: Temp: 98.1 F (36.7 C) (06/07 0124) Temp src: Oral (06/06 2205) BP: 155/98 mmHg (06/07 0124) Pulse Rate: 98 (06/07 0124) Intake/Output from previous day: 06/06 0701 - 06/07 0700 In: -  Out: 5 [Urine:5] Intake/Output from this shift: Total I/O In: -  Out: 5 [Urine:5]  Labs:  Recent Labs  03/19/14 2253  WBC 16.3*  HGB 9.7*  PLT 240  CREATININE 1.41*   The CrCl is unknown because both a height and weight (above a minimum accepted value) are required for this calculation. No results found for this basename: VANCOTROUGH, VANCOPEAK, VANCORANDOM, GENTTROUGH, GENTPEAK, GENTRANDOM, TOBRATROUGH, TOBRAPEAK, TOBRARND, AMIKACINPEAK, AMIKACINTROU, AMIKACIN,  in the last 72 hours   Microbiology: No results found for this or any previous visit (from the past 720 hour(s)).  Medical History: Past Medical History  Diagnosis Date  . CAD (coronary artery disease)   . Hypertension   . Diabetes mellitus     type 2 insulin dependent  . Hyperlipidemia   . CHF (congestive heart failure)   . Cellulitis     left foot  . Ulcer     diabetic ulcer on left foot  . DVT (deep venous thrombosis)     peroneal vein  . Peripheral vascular disease   . ESRD on hemodialysis   . Anemia   . Insomnia     Medications:  Prescriptions prior to admission  Medication Sig Dispense Refill  . acetaminophen (TYLENOL) 650 MG CR tablet Take 650 mg by mouth every 8 (eight) hours as needed for pain.      [name] amLODipine (NORVASC) 5 MG tablet Take 10 mg by mouth daily.       [name] atorvastatin (LIPITOR) 40 MG tablet Take 40 mg by mouth daily.       [name] b complex-vitamin c-folic acid (NEPHRO-VITE)  0.8 MG TABS tablet Take 1 tablet by mouth daily.      . calcitRIOL (ROCALTROL) 0.25 MCG capsule Take 0.5 mcg by mouth daily.      . clopidogrel (PLAVIX) 75 MG tablet Take 1 tablet (75 mg total) by mouth daily.      . collagenase (SANTYL) ointment Apply topically daily.  15 g  0  . diphenhydrAMINE (BENADRYL) 25 MG tablet Take 25 mg by mouth every 6 (six) hours as needed for itching.      . fentaNYL (DURAGESIC - DOSED MCG/HR) 25 MCG/HR patch Place 1 patch (25 mcg total) onto the skin every 3 (three) days.  5 patch  0  . gabapentin (NEURONTIN) 300 MG capsule Take 300 mg by mouth 3 (three) times daily.      [name] HYDROcodone-acetaminophen (NORCO/VICODIN) 5-325 MG per tablet Take 1 tablet by mouth every 4 (four) hours as needed. And I po q4 scheduled      . isosorbide mononitrate (IMDUR) 30 MG 24 hr tablet Take 15 mg by mouth daily.      [name] lanthanum (FOSRENOL) 500 MG chewable tablet Chew 1 tablet (500 mg total) by mouth 3 (three) times daily with meals.  90 tablet  0  . mirtazapine (REMERON) 7.5 MG tablet Take 7.5 mg by mouth at bedtime.      . nitroGLYCERIN (NITROSTAT)  0.4 MG SL tablet Place 0.4 mg under the tongue every 5 (five) minutes as needed for chest pain.      [name]. omeprazole (PRILOSEC) 20 MG capsule Take 20 mg by mouth daily.       . potassium chloride SA (K-DUR,KLOR-CON) 20 MEQ tablet Take 20 mEq by mouth daily.      . vitamin C (ASCORBIC ACID) 500 MG tablet Take 500 mg by mouth daily.      [name]. zinc sulfate 220 MG capsule Take 220 mg by mouth daily.       Assessment: Increased weakness and AMS after dialysis. TTS schedule. Last cipro in ED at 0015. Also vanc for cellulitis  Goal of Therapy:  Vancomycin trough level 10-15 mcg/ml  Plan:  Vancomycin 1gm x1 then 500 after each HD session of 4 hours.   cipro q24h next dose at 2200 6/7  Janice CoffinWilliam Jonathan Florina Glas 03/20/2014,2:18 AM

## 2014-03-20 NOTE — Progress Notes (Signed)
Patient's daughter upset because her mother is in pain and has not been given pain medication today.  Patient had Vicodin ordered prn.  Daughter upset and said that her mother does not take Vicodin. The daughter said she told the day nurse that her mother takes Ultram with Oxycone every 4 hours and the nurse was suppose to call the MD to get it changed.  I looked at the patient's home medication list and Vicodin, Ultram, and Oxycodone are listed.  Daughter started yelling wanting to know who completes those list. Daughter stated "you people never do anything right here".  I verified with the daughter that the Ultram and Oxycodone are given every 4 hours together. The daughter ask me if I have even seen her wounds.  I told her I had.  I sent a text to the on call doctor. Daughter then said that we were exposing her mother by having the door open.  I explained to the daughter that I had not pulled back the blanket or anything to expose her mother but I would shut the door on my way out.  Daughter yelled " you will close the door now per my request". I smiled and said OK, I will be back and shut the patient's door. Spoke with the MD on call and she said she would not give Ultram with Oxycodone every 4 hours together because of age and liver.  MD added oxycodone 10 mg q4hr and Ultram 50 q6hr with a one time dose of morphine to be given with ultram.  MD said "wait  2 hour between the Oxycodone and ultram, do not give them together".

## 2014-03-20 NOTE — Consult Note (Signed)
[facility] Renal Consultation Note  Indication for Consultation:  Management of ESRD/hemodialysis; anemia, hypertension/volume and secondary hyperparathyroidism  HPI: [name] is a 73 y.o. 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  Her urinalysis indicated infection, but she also has multiple necrotic areas and ulcers, including a large sacral decubitus which could also be a source of infection.  She is currently on IV Vancomcyin and Cipro, and her mental status has reportedly improved, but baseline is uncertain.  She denies any pain and has no complaints.  She is currently staying at home, but was at a skilled nursing facility in April-May and briefly under hospice care.  Dialysis Orders:  TTS @ [facility] 4 hrs     51.5 kg       2K/2.25Ca       400/A1.5        Heparin 1800 U       R IJ catheter   No Hectorol        Aranesp 60 mcg on Tues          No Venofer  Past Medical History  Diagnosis Date  . CAD (coronary artery disease)   . Hypertension   . Diabetes mellitus     type 2 insulin dependent  . Hyperlipidemia   . CHF (congestive heart failure)   . Cellulitis     left foot  . Ulcer     diabetic ulcer on left foot  . DVT (deep venous thrombosis)     peroneal vein  . Peripheral vascular disease   . ESRD on hemodialysis   . Anemia   . Insomnia    Past Surgical History  Procedure Laterality Date  . Tubal ligation    . Pr vein bypass graft,aorto-fem-pop  06-26-11    1. PATENT LEFT FEMORAL-POPLITEAL BYPASS GRAFT W/NO EVEIDENCE OF STENOSIS. 2.VELOCITIES OF GREATER THAN 200 CM/'s NOTED ON PREVIOUS EXAM 10/03/11 WERE NOT ADEQUATELY VISULAIZED DURING THIS EXAM  . Coronary artery bypass graft  04/25/08    Dr. Servando Snare, CABG x  5   . Nm myoview ltd  01/13/13    LEXISCAN; LV WALL MOTION: LVEF 44%, INFERIOR AKINESIS, ANTEROAPICAL HYPOKINESIS  . Cardiac catheterization  04/22/08    SEVERE 3-VESSEL DISEASE CAD., CABG X 5 04/25/08 WITH DR. PXTGGYIR  . Av fistula placement Left 09/07/2013    Procedure: ARTERIOVENOUS (AV) FISTULA CREATION;  Surgeon: Elam Dutch, MD;  Location: [facility] OR;  Service: Vascular;  Laterality: Left;  . Ligation arteriovenous gortex graft Left 01/09/2014    Procedure: LIGATION ARTERIOVENOUS GORTEX GRAFT;  Surgeon: Elam Dutch, MD;  Location: [place];  Service: Vascular;  Laterality: Left;  . Insertion of dialysis catheter Right 01/09/2014    Procedure: INSERTION OF DIALYSIS CATHETER;  Surgeon: Elam Dutch, MD;  Location: [place];  Service: Vascular;  Laterality: Right;  Insertion of diatek to right internal Jugular artery.  . Amputation Right 01/12/2014    Procedure: AMPUTATION ABOVE KNEE- RIGHT;  Surgeon: Mal Misty, MD;  Location: [facility] OR;  Service: Vascular;  Laterality: Right;  . 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 Cancer Mother     Kidney  . Kidney disease Mother   .  Cancer Father     skin   Social History  She quit smoking cigarettes about 17 years ago after using as much as two packs a day. She occasionally drank alcohol, but denies any illicit drug use.   Allergies  Allergen Reactions  . Ivp Dye [Iodinated Diagnostic Agents] Hives and Rash  . Aspirin Hives   Prior to Admission medications   Medication Sig Start Date End Date Taking? Authorizing Provider  acetaminophen (TYLENOL) 650 MG CR tablet Take 650 mg by mouth every 8 (eight) hours as needed for pain.    Historical Provider, MD  amLODipine (NORVASC) 5 MG tablet Take 10 mg by mouth daily.     Historical Provider, MD  atorvastatin (LIPITOR) 40 MG tablet Take 40 mg by mouth daily.     Historical Provider, MD  b complex-vitamin c-folic acid (NEPHRO-VITE) 0.8 MG TABS tablet  Take 1 tablet by mouth daily.    Historical Provider, MD  calcitRIOL (ROCALTROL) 0.25 MCG capsule Take 0.5 mcg by mouth daily.    Historical Provider, MD  clopidogrel (PLAVIX) 75 MG tablet Take 1 tablet (75 mg total) by mouth daily. 02/02/14   Verlee Monte, MD  collagenase (SANTYL) ointment Apply topically daily. 02/02/14   Verlee Monte, MD  diphenhydrAMINE (BENADRYL) 25 MG tablet Take 25 mg by mouth every 6 (six) hours as needed for itching.    Historical Provider, MD  fentaNYL (DURAGESIC - DOSED MCG/HR) 25 MCG/HR patch Place 1 patch (25 mcg total) onto the skin every 3 (three) days. 02/02/14   Verlee Monte, MD  gabapentin (NEURONTIN) 300 MG capsule Take 300 mg by mouth 3 (three) times daily.    Historical Provider, MD  HYDROcodone-acetaminophen (NORCO/VICODIN) 5-325 MG per tablet Take 1 tablet by mouth every 4 (four) hours as needed. And I po q4 scheduled    Historical Provider, MD  isosorbide mononitrate (IMDUR) 30 MG 24 hr tablet Take 15 mg by mouth daily.    Historical Provider, MD  lanthanum (FOSRENOL) 500 MG chewable tablet Chew 1 tablet (500 mg total) by mouth 3 (three) times daily with meals. 01/15/14   Orson Eva, MD  mirtazapine (REMERON) 7.5 MG tablet Take 7.5 mg by mouth at bedtime.    Historical Provider, MD  nitroGLYCERIN (NITROSTAT) 0.4 MG SL tablet Place 0.4 mg under the tongue every 5 (five) minutes as needed for chest pain.    Historical Provider, MD  omeprazole (PRILOSEC) 20 MG capsule Take 20 mg by mouth daily.     Historical Provider, MD  potassium chloride SA (K-DUR,KLOR-CON) 20 MEQ tablet Take 20 mEq by mouth daily.    Historical Provider, MD  vitamin C (ASCORBIC ACID) 500 MG tablet Take 500 mg by mouth daily.    Historical Provider, MD  zinc sulfate 220 MG capsule Take 220 mg by mouth daily.    Historical Provider, MD   Labs:  Results for orders placed during the hospital encounter of 03/19/14 (from the past 48 hour(s))  URINALYSIS, ROUTINE W REFLEX MICROSCOPIC     Status:  Abnormal   Collection Time    03/19/14 10:27 PM      Result Value Ref Range   Color, Urine BROWN (*) YELLOW   Comment: BIOCHEMICALS MAY BE AFFECTED BY COLOR   APPearance TURBID (*) CLEAR   Specific Gravity, Urine 1.031 (*) 1.005 - 1.030   pH 5.0  5.0 - 8.0   Glucose, UA NEGATIVE  NEGATIVE mg/dL   Hgb urine dipstick MODERATE (*) NEGATIVE   Bilirubin  Urine MODERATE (*) NEGATIVE   Ketones, ur 15 (*) NEGATIVE mg/dL   Protein, ur 100 (*) NEGATIVE mg/dL   Urobilinogen, UA 1.0  0.0 - 1.0 mg/dL   Nitrite POSITIVE (*) NEGATIVE   Leukocytes, UA LARGE (*) NEGATIVE  URINE MICROSCOPIC-ADD ON     Status: Abnormal   Collection Time    03/19/14 10:27 PM      Result Value Ref Range   Squamous Epithelial / LPF MANY (*) RARE   WBC, UA TOO NUMEROUS TO COUNT  <3 WBC/hpf   RBC / HPF 11-20  <3 RBC/hpf   Bacteria, UA FEW (*) RARE   Urine-Other MANY YEAST    CBC WITH DIFFERENTIAL     Status: Abnormal   Collection Time    03/19/14 10:53 PM      Result Value Ref Range   WBC 16.3 (*) 4.0 - 10.5 K/uL   RBC 3.68 (*) 3.87 - 5.11 MIL/uL   Hemoglobin 9.7 (*) 12.0 - 15.0 g/dL   HCT 30.8 (*) 36.0 - 46.0 %   MCV 83.7  78.0 - 100.0 fL   MCH 26.4  26.0 - 34.0 pg   MCHC 31.5  30.0 - 36.0 g/dL   RDW 26.1 (*) 11.5 - 15.5 %   Platelets 240  150 - 400 K/uL   Neutrophils Relative % 89 (*) 43 - 77 %   Lymphocytes Relative 6 (*) 12 - 46 %   Monocytes Relative 5  3 - 12 %   Eosinophils Relative 0  0 - 5 %   Basophils Relative 0  0 - 1 %   Neutro Abs 14.5 (*) 1.7 - 7.7 K/uL   Lymphs Abs 1.0  0.7 - 4.0 K/uL   Monocytes Absolute 0.8  0.1 - 1.0 K/uL   Eosinophils Absolute 0.0  0.0 - 0.7 K/uL   Basophils Absolute 0.0  0.0 - 0.1 K/uL   RBC Morphology POLYCHROMASIA PRESENT     Comment: BASOPHILIC STIPPLING     TARGET CELLS     RARE NRBCs  BASIC METABOLIC PANEL     Status: Abnormal   Collection Time    03/19/14 10:53 PM      Result Value Ref Range   Sodium 143  137 - 147 mEq/L   Potassium 3.0 (*) 3.7 - 5.3 mEq/L    Chloride 100  96 - 112 mEq/L   CO2 29  19 - 32 mEq/L   Glucose, Bld 149 (*) 70 - 99 mg/dL   BUN 13  6 - 23 mg/dL   Creatinine, Ser 1.41 (*) 0.50 - 1.10 mg/dL   Calcium 8.7  8.4 - 10.5 mg/dL   GFR calc non Af Amer 36 (*) >90 mL/min   GFR calc Af Amer 42 (*) >90 mL/min   Comment: (NOTE)     The eGFR has been calculated using the CKD EPI equation.     This calculation has not been validated in all clinical situations.     eGFR's persistently <90 mL/min signify possible Chronic Kidney     Disease.  I-STAT CG4 LACTIC ACID, ED     Status: None   Collection Time    03/19/14 11:03 PM      Result Value Ref Range   Lactic Acid, Venous 2.06  0.5 - 2.2 mmol/L  MRSA PCR SCREENING     Status: None   Collection Time    03/20/14  1:04 AM      Result Value Ref Range   MRSA  by PCR NEGATIVE  NEGATIVE   Comment:            The GeneXpert MRSA Assay (FDA     approved for NASAL specimens     only), is one component of a     comprehensive MRSA colonization     surveillance program. It is not     intended to diagnose MRSA     infection nor to guide or     monitor treatment for     MRSA infections.  GLUCOSE, CAPILLARY     Status: Abnormal   Collection Time    03/20/14  1:26 AM      Result Value Ref Range   Glucose-Capillary 145 (*) 70 - 99 mg/dL  COMPREHENSIVE METABOLIC PANEL     Status: Abnormal   Collection Time    03/20/14  3:14 AM      Result Value Ref Range   Sodium 142  137 - 147 mEq/L   Potassium 3.4 (*) 3.7 - 5.3 mEq/L   Chloride 98  96 - 112 mEq/L   CO2 25  19 - 32 mEq/L   Glucose, Bld 167 (*) 70 - 99 mg/dL   BUN 15  6 - 23 mg/dL   Creatinine, Ser 1.60 (*) 0.50 - 1.10 mg/dL   Calcium 8.7  8.4 - 10.5 mg/dL   Total Protein 6.2  6.0 - 8.3 g/dL   Albumin 1.7 (*) 3.5 - 5.2 g/dL   AST 17  0 - 37 U/L   ALT 11  0 - 35 U/L   Alkaline Phosphatase 130 (*) 39 - 117 U/L   Total Bilirubin 0.8  0.3 - 1.2 mg/dL   GFR calc non Af Amer 31 (*) >90 mL/min   GFR calc Af Amer 36 (*) >90 mL/min    Comment: (NOTE)     The eGFR has been calculated using the CKD EPI equation.     This calculation has not been validated in all clinical situations.     eGFR's persistently <90 mL/min signify possible Chronic Kidney     Disease.  CBC     Status: Abnormal   Collection Time    03/20/14  3:14 AM      Result Value Ref Range   WBC 16.3 (*) 4.0 - 10.5 K/uL   RBC 3.74 (*) 3.87 - 5.11 MIL/uL   Hemoglobin 9.7 (*) 12.0 - 15.0 g/dL   HCT 31.8 (*) 36.0 - 46.0 %   MCV 85.0  78.0 - 100.0 fL   MCH 25.9 (*) 26.0 - 34.0 pg   MCHC 30.5  30.0 - 36.0 g/dL   RDW 26.5 (*) 11.5 - 15.5 %   Platelets 243  150 - 400 K/uL  PROTIME-INR     Status: Abnormal   Collection Time    03/20/14  3:14 AM      Result Value Ref Range   Prothrombin Time 17.0 (*) 11.6 - 15.2 seconds   INR 1.42  0.00 - 1.49  SEDIMENTATION RATE     Status: Abnormal   Collection Time    03/20/14  3:14 AM      Result Value Ref Range   Sed Rate 35 (*) 0 - 22 mm/hr   Review of systems not obtained due to patient factors.  Physical Exam: Filed Vitals:   03/20/14 0557  BP: 168/86  Pulse: 78  Temp: 97.6 F (36.4 C)  Resp: 18     General appearance: alert, cooperative and no distress Head: Normocephalic,  without obvious abnormality, atraumatic Neck: no adenopathy, no carotid bruit, no JVD and supple, symmetrical, trachea midline Resp: clear to auscultation bilaterally Cardio: regular rate and rhythm, S1, S2 normal, no murmur, click, rub or gallop GI: soft, non-tender; bowel sounds normal; no masses,  no organomegaly Extremities: R AKA with healed darkened wound, no edema on L, L hand with dry gangrene @ digits 2,4,& 5; L big toe & heel dark purple Skin: Skin color, texture, turgor normal. No rashes or lesions or large necrotic lesions over upper thighs and torso, large wet yellow lesion on L abdomen, dressing over large sacral decubitus Neurologic: Grossly normal Dialysis Access: R IJ catheter   Assessment/Plan: 1. AMS -  believed to be sec to UTI ( + UA) or possibly decubitus ulcer; on IV Vancomycin & Cipro (last UTI resistant to [place]). 2. Sacral ulcer - wound care, on antibiotics. 3. PVD - multiple areas of dry gangrene, especially @ L hand,sec to steal; AVF @ LUA ligated 3/29. 4. 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 5. ESRD - HD on TTS @ [facility], K3.4.  Next HD 6/9. 6. Hypertension/volume - BP 172/90 on Amlodipine; full HD yesterday, no volume excess. 7. Anemia - Hgb 9.7 on Aranesp 60 mcg on Tues; Fe 11%, but ferritin > 5300. 8. Metabolic bone disease - Ca 8.7 (10.5 corrected), Calcitriol 0.5 mcg qd (not on outpatient list), Fosrenol with meals. Hold Calcitriol.  9. Nutrition - Alb 1.7, renal diet, vitamin.   10. DM Type 2 - Lantus & SSI, no longer on meds. 11. Hx CAD - s/p CABG x 5 in 2009.   Ramiro Harvest 03/20/2014, 11:02 AM   Attending Nephrologist: Roney Jaffe, MD  Pt seen, examined and agree w A/P as above. ESRD patient with dementia, DM 2, hx CABG, ESRD started HD in March 2015.  She presents with AMS.  Last hospital stay here in March she was started on HD, she had a TDC and a AVF placed L arm.  She developed acute steal syndrome L hand and so the AVF was ligated. She had R foot wounds which required a R AKA. 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  Pt looks to be dying, however the daughter has no interest whatsoever in talking about withdrawal of dialysis or comfort care and got upset when I recommended considering these options.  She is the POA and says that her mother "has suffered a lot worse" and that they are going to continue everything until "God takes her".  Plan HD on TTS schedule.  Stop vit D for calciphylaxis, no other suggestions.  Kelly Splinter  MD pager [phone]    cell [phone] 03/20/2014, 3:23 PM

## 2014-03-20 NOTE — Plan of Care (Cosign Needed)
Daughter very angry pt not on usual home pain meds- note pt presented with AMS presumed partially due to pre admit meds (Oxy IR 15 q 4 hrs and Ultram 50 q 4 hrs)- have opted to dc Vicodin and resume Oxy and Ultram with caveat that will need to dc or change pt on pt response. Ordered Oxy IR 10 instead of 15 every 4 and ordered Ultram 50 but every 6 hrs and gave 1 x dose IV Morphine to help get pain under control  Junious Silk, ANP

## 2014-03-20 NOTE — H&P (Signed)
[facility] History and Physical  Patient: [name]  ZOX:[id number]  DOB: 27-Feb-1941  DOS: the patient was seen and examined on 03/20/2014 PCP: Michiel Sites, MD  Chief Complaint: Generalized weakness  HPI: [name] is a 73 y.o. female with Past medical history of coronary artery disease, hypertension, diabetes mellitus, CHF, DVT history, ESRD on hemodialysis, AKA, peripheral vascular disease with multiple decubiti ulcer and multiple digits involving dry gangrene following with vascular surgery and wound care. Patient presents with altered mental status. Patient was not able to provide significant history and history was obtained from ED physician and documentation. Patient was admitted in a nursing home after her recent hospitalization in April and was discharged last month from the nursing home. Patient went for hemodialysis and since then has been less responsive. Patient has been becoming progressively weak and lethargic and complained of increasing pain in her fingers as well as in her back. Family called EMS due to mental status changes. At the time of my evaluation unfortunately no family was available for history.  The patient is coming from home. And at her baseline dependent for most of her ADL.  Review of Systems: as mentioned in the history of present illness.  A Comprehensive review of the other systems is negative.  Past Medical History  Diagnosis Date  . CAD (coronary artery disease)   . Hypertension   . Diabetes mellitus     type 2 insulin dependent  . Hyperlipidemia   . CHF (congestive heart failure)   . Cellulitis     left foot  . Ulcer     diabetic ulcer on left foot  . DVT (deep venous thrombosis)     peroneal vein  . Peripheral vascular disease   . ESRD on hemodialysis   . Anemia   . Insomnia    Past Surgical History  Procedure Laterality Date  . Tubal ligation    . Pr vein bypass graft,aorto-fem-pop  06-26-11    1. PATENT LEFT  FEMORAL-POPLITEAL BYPASS GRAFT W/NO EVEIDENCE OF STENOSIS. 2.VELOCITIES OF GREATER THAN 200 CM/'s NOTED ON PREVIOUS EXAM 10/03/11 WERE NOT ADEQUATELY VISULAIZED DURING THIS EXAM  . Coronary artery bypass graft  04/25/08    Dr. Tyrone Sage, CABG x 5   . Nm myoview ltd  01/13/13    LEXISCAN; LV WALL MOTION: LVEF 44%, INFERIOR AKINESIS, ANTEROAPICAL HYPOKINESIS  . Cardiac catheterization  04/22/08    SEVERE 3-VESSEL DISEASE CAD., CABG X 5 04/25/08 WITH DR. [id number]  . Av fistula placement Left 09/07/2013    Procedure: ARTERIOVENOUS (AV) FISTULA CREATION;  Surgeon: Sherren Kerns, MD;  Location[facility] OR;  Service: Vascular;  Laterality: Left;  . Ligation arteriovenous gortex graft Left 01/09/2014    Procedure: LIGATION ARTERIOVENOUS GORTEX GRAFT;  Surgeon: Sherren Kerns, MD;  Location: [facility] OR;  Service: Vascular;  Laterality: Left;  . Insertion of dialysis catheter Right 01/09/2014    Procedure: INSERTION OF DIALYSIS CATHETER;  Surgeon: Sherren Kerns, MD;  Location: [facility] OR;  Service: Vascular;  Laterality: Right;  Insertion of diatek to right internal Jugular artery.  . Amputation Right 01/12/2014    Procedure: AMPUTATION ABOVE KNEE- RIGHT;  Surgeon: Pryor Ochoa, MD;  Location: [facility] OR;  Service: Vascular;  Laterality: Right;  . 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 Her smoking use included Cigarettes. She has a 40 pack-year smoking history. She has quit using smokeless tobacco. Her  smokeless tobacco use included Snuff. She reports that she does not drink alcohol or use illicit drugs.  Allergies  Allergen Reactions  . Ivp Dye [Iodinated Diagnostic Agents] Hives and Rash  . Aspirin Hives    Family History  Problem Relation Age of Onset  . Cancer Mother     Kidney  . Kidney disease Mother   . Cancer Father     skin    Prior to Admission medications   Medication Sig Start Date End Date Taking? Authorizing Provider   acetaminophen (TYLENOL) 650 MG CR tablet Take 650 mg by mouth every 8 (eight) hours as needed for pain.    Historical Provider, MD  amLODipine (NORVASC) 5 MG tablet Take 10 mg by mouth daily.     Historical Provider, MD  atorvastatin (LIPITOR) 40 MG tablet Take 40 mg by mouth daily.     Historical Provider, MD  b complex-vitamin c-folic acid (NEPHRO-VITE) 0.8 MG TABS tablet Take 1 tablet by mouth daily.    Historical Provider, MD  calcitRIOL (ROCALTROL) 0.25 MCG capsule Take 0.5 mcg by mouth daily.    Historical Provider, MD  clopidogrel (PLAVIX) 75 MG tablet Take 1 tablet (75 mg total) by mouth daily. 02/02/14   Clydia LlanoMutaz Elmahi, MD  collagenase (SANTYL) ointment Apply topically daily. 02/02/14   Clydia LlanoMutaz Elmahi, MD  diphenhydrAMINE (BENADRYL) 25 MG tablet Take 25 mg by mouth every 6 (six) hours as needed for itching.    Historical Provider, MD  fentaNYL (DURAGESIC - DOSED MCG/HR) 25 MCG/HR patch Place 1 patch (25 mcg total) onto the skin every 3 (three) days. 02/02/14   Clydia LlanoMutaz Elmahi, MD  gabapentin (NEURONTIN) 300 MG capsule Take 300 mg by mouth 3 (three) times daily.    Historical Provider, MD  HYDROcodone-acetaminophen (NORCO/VICODIN) 5-325 MG per tablet Take 1 tablet by mouth every 4 (four) hours as needed. And I po q4 scheduled    Historical Provider, MD  isosorbide mononitrate (IMDUR) 30 MG 24 hr tablet Take 15 mg by mouth daily.    Historical Provider, MD  lanthanum (FOSRENOL) 500 MG chewable tablet Chew 1 tablet (500 mg total) by mouth 3 (three) times daily with meals. 01/15/14   Catarina Hartshornavid Tat, MD  mirtazapine (REMERON) 7.5 MG tablet Take 7.5 mg by mouth at bedtime.    Historical Provider, MD  nitroGLYCERIN (NITROSTAT) 0.4 MG SL tablet Place 0.4 mg under the tongue every 5 (five) minutes as needed for chest pain.    Historical Provider, MD  omeprazole (PRILOSEC) 20 MG capsule Take 20 mg by mouth daily.     Historical Provider, MD  potassium chloride SA (K-DUR,KLOR-CON) 20 MEQ tablet Take 20 mEq by mouth  daily.    Historical Provider, MD  vitamin C (ASCORBIC ACID) 500 MG tablet Take 500 mg by mouth daily.    Historical Provider, MD  zinc sulfate 220 MG capsule Take 220 mg by mouth daily.    Historical Provider, MD    Physical Exam: Filed Vitals:   03/19/14 2215 03/19/14 2230 03/20/14 0015 03/20/14 0124  BP: 124/87 164/104  155/98  Pulse:   91 98  Temp:    98.1 F (36.7 C)  TempSrc:      Resp: 19 15 17 20   Weight:    49.8 kg (109 lb 12.6 oz)  SpO2:   100% 100%    General: Alert, Awake and Oriented to Time, Place and Person. Appear in moderate distress Eyes: PERRL ENT: Oral Mucosa clear moist. Neck: no JVD Cardiovascular: S1  and S2 Present, aortic systolic Murmur, Peripheral Pulses Present Respiratory: Bilateral Air entry equal and Decreased, Clear to Auscultation,  no Crackles,no wheezes Abdomen: Bowel Sound Present, Soft and Non tender Skin: Left hand fingers with dry gangrene secondary to her Steal syndrome,  Multiple decubiti ulcer which yellow white base  Right AKA wound healing Left first toe blackish purplish discoloration   Extremities: No edema  Neurologic: Grossly no focal neuro deficit.  Labs on Admission:  CBC:  Recent Labs Lab 03/19/14 2253 03/20/14 0314  WBC 16.3* 16.3*  NEUTROABS 14.5*  --   HGB 9.7* 9.7*  HCT 30.8* 31.8*  MCV 83.7 85.0  PLT 240 243    CMP     Component Value Date/Time   NA 142 03/20/2014 0314   K 3.4* 03/20/2014 0314   CL 98 03/20/2014 0314   CO2 25 03/20/2014 0314   GLUCOSE 167* 03/20/2014 0314   BUN 15 03/20/2014 0314   CREATININE 1.60* 03/20/2014 0314   CALCIUM 8.7 03/20/2014 0314   CALCIUM 8.7 07/01/2011 1831   PROT 6.2 03/20/2014 0314   ALBUMIN 1.7* 03/20/2014 0314   AST 17 03/20/2014 0314   ALT 11 03/20/2014 0314   ALKPHOS 130* 03/20/2014 0314   BILITOT 0.8 03/20/2014 0314   GFRNONAA 31* 03/20/2014 0314   GFRAA 36* 03/20/2014 0314    No results found for this basename: LIPASE, AMYLASE,  in the last 168 hours No results found for this  basename: AMMONIA,  in the last 168 hours  No results found for this basename: CKTOTAL, CKMB, CKMBINDEX, TROPONINI,  in the last 168 hours BNP (last 3 results)  Recent Labs  08/17/13 1603 12/22/13 1809 01/23/14 1140  PROBNP 28521.0* >70000.0* >70000.0*    Radiological Exams on Admission: Dg Chest 2 View  03/19/2014   CLINICAL DATA:  Diarrhea, rectal pain, history of CHF.  EXAM: CHEST  2 VIEW  COMPARISON:  Chest radiograph January 23, 2014.  FINDINGS: Cardiac silhouette is mildly enlarged, similar. Mildly calcified aortic knob. Status post median sternotomy for CABG. Similar COPD with biapical scarring/ bullous changes and pleural thickening. Small right pleural effusion. No pneumothorax.  Tunneled dialysis catheter via right internal jugular venous approach with distal tip projecting cavoatrial junction. Mild degenerative change of the shoulders and thoracic spine. Severe calcification of the abdominal vessels partially imaged.  IMPRESSION: Mild cardiomegaly, COPD with small right pleural effusion.   Electronically Signed   By: Awilda Metro   On: 03/19/2014 23:41   Ct Head Wo Contrast  03/19/2014   CLINICAL DATA:  Less responsive than normal.  EXAM: CT HEAD WITHOUT CONTRAST  TECHNIQUE: Contiguous axial images were obtained from the base of the skull through the vertex without intravenous contrast.  COMPARISON:  CT of the head January 23, 2014.  FINDINGS: The ventricles and sulci are normal for age. No intraparenchymal hemorrhage, mass effect nor midline shift. Confluent supratentorial white matter hypodensities are nonspecific though suggest sequelae of chronic small vessel ischemic disease. No acute large vascular territory infarcts. Remote right caudate lacunar infarct.  No abnormal extra-axial fluid collections. Basal cisterns are patent. Severe calcific atherosclerosis of the carotid siphons.  No skull fracture. The included ocular globes and orbital contents are non-suspicious. Fluid filled  mildly expanded sella, unchanged. The mastoid aircells and included paranasal sinuses are well-aerated. Patient is edentulous. Marked scalp calcifications.  IMPRESSION: No acute intracranial process.  Moderate global brain atrophy with moderate to severe white matter changes suggesting chronic small vessel ischemic disease. Remote right caudate  lacunar infarct.   Electronically Signed   By: Awilda Metro   On: 03/19/2014 23:30    EKG: Independently reviewed. RBBB.  Assessment/Plan Principal Problem:   Acute encephalopathy Active Problems:   Peripheral vascular disease, unspecified   S/P CABG x 5   Chronic systolic heart failure   COPD (chronic obstructive pulmonary disease)   Essential hypertension   Anemia   DM type 2, uncontrolled, with renal complications   UTI (lower urinary tract infection)   Sacral decubitus ulcer   Gangrene of finger   S/P AKA (above knee amputation)   VTE (venous thromboembolism)   1. Acute encephalopathy  patient is presenting with progressively worsening lethargy and weakness. She is recently placed on hemodialysis and still urinates. Her urine appears to be having by UTI. Her decubitus ulcers also appears to be infected with yellow-green discoloration. She has been going through wound care. At present empirically I would treat her with IV vancomycin and ciprofloxacin. Her last urine culture was positive for ceftriaxone resistant bacteria. I would also scale date on psychotropic medications. Patient at present does not rather fentanyl patch it is unclear whether she has been on that or not therefore I would hold it.  2.Peripheral vascular disease Multiple gangrene AKA Decubitus ulcer Patient has significant peripheral vascular disease leading to multiple gangrene as well as left hand steal syndrome. Patient has been following with the vascular surgery as an outpatient and has been referred for hand surgeon and plastic surgeon for further workup. At  present I would prefer to hold off on further workup and discuss with the family about goals of care.  3. Hypertension At present continue home antihypertensive medications.  4. ESRD Patient is on hemodialysis Monday Wednesday Friday we'll continue hemodialysis on Monday.  DVT Prophylaxis: subcutaneous Heparin Nutrition:  renal diet  Code Status:  DNR/DNI  Disposition: Admitted to inpatient in med-surge unit.  Author: Lynden Oxford, MD [facility] Pager: [phone] 03/20/2014, 5:44 AM    If 7PM-7AM, please contact night-coverage [email or web address] Password [email or web address]

## 2014-03-20 NOTE — Progress Notes (Signed)
Spoke with daughter concerning pain management.  Daughter OK with how it is set up now.

## 2014-03-21 DIAGNOSIS — L089 Local infection of the skin and subcutaneous tissue, unspecified: Secondary | ICD-10-CM

## 2014-03-21 DIAGNOSIS — G9340 Encephalopathy, unspecified: Secondary | ICD-10-CM

## 2014-03-21 DIAGNOSIS — I96 Gangrene, not elsewhere classified: Secondary | ICD-10-CM

## 2014-03-21 DIAGNOSIS — T148XXA Other injury of unspecified body region, initial encounter: Secondary | ICD-10-CM

## 2014-03-21 MED ORDER — RENA-VITE PO TABS
1.0000 | ORAL_TABLET | Freq: Every day | ORAL | Status: DC
  Administered 2014-03-21 – 2014-03-22 (×2): 1 via ORAL
  Filled 2014-03-21 (×2): qty 1

## 2014-03-21 MED ORDER — PRO-STAT SUGAR FREE PO LIQD
30.0000 mL | Freq: Three times a day (TID) | ORAL | Status: DC
  Administered 2014-03-21 – 2014-03-23 (×3): 30 mL via ORAL
  Administered 2014-03-23: 09:00:00 via ORAL
  Filled 2014-03-21 (×6): qty 30

## 2014-03-21 MED ORDER — NEPRO/CARBSTEADY PO LIQD
237.0000 mL | Freq: Two times a day (BID) | ORAL | Status: DC
  Administered 2014-03-21 – 2014-03-23 (×5): 237 mL via ORAL

## 2014-03-21 MED ORDER — DAKINS (1/4 STRENGTH) 0.125 % EX SOLN
Freq: Every morning | CUTANEOUS | Status: DC
  Administered 2014-03-21: 11:00:00
  Filled 2014-03-21: qty 473

## 2014-03-21 NOTE — Progress Notes (Signed)
[facility] Progress Note  Assessment/Plan: 1. AMS - believed to be sec to UTI ( + UA) or possibly decubitus ulcer; on IV Vancomycin & Cipro (last UTI resistant to [facility]); BC and urine cultures pending 2. Sacral ulcer - wound care, on antibiotics. 3. Severe PVD with steal syndrome- multiple areas of dry gangrene, especially @ L hand,sec to steal; AVF @ LUA ligated 3/29. 4. 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 5. ESRD - HD on TTS @ [facility], K3.4. Next HD due - consider deferring if she is not responsive 6. Hypertension/volume - on Amlodipine;, no volume excess. 7. Anemia - Hgb 9.7 on Aranesp 60 mcg on Tues; Fe 11%, but ferritin > 2600; CRP 11 8. Metabolic bone disease - Ca 8.7 (10.5 corrected), Calcitriol 0.5 mcg qd (not on outpatient list), Fosrenol with meals. Hold Calcitriol. - should not be on if really calciphylaxis 9. Severe portein malnutrition - Alb 1.7, renal diet, vitamin. Needs protein supplement; RD consult -  10. DM Type 2 - Lantus & SSI, no longer on meds. 11. Hx CAD - s/p CABG x 5 in 2009. 12. Disp - had recent SNF stay, discharged home then hospitalized twice at [facility] in May; she was discharged from hospice because she wished to continue HD; Will continue revisiting goals of care - her long term prognosis is extremely poor; it would be inappropriate to prolong her sufferering with HD; However, her daughter is POA and wants HD to continue- until God takes her --see Dr. Malena Catholic comments at the end of the consult note.  Sheffield Slider, PA-C [facility] Beeper [phone] 03/21/2014,8:19 AM  LOS: 2 days  I have seen and examined this patient and agree with plan per Bard Herbert.  Minimally responsive.  She has had FTT for months and her prognosis is poor.  I am afraid that continuation of HD will only keep her alive to suffer. Jorje Guild 03/21/2014 10:23 AM Subjective:   Not responsive  Objective Filed Vitals:   03/20/14  1107 03/20/14 1741 03/20/14 2046 03/21/14 0535  BP: 172/90 149/75 110/75 146/42  Pulse:  92 87 73  Temp:  97.7 F (36.5 C) 98.3 F (36.8 C) 98.6 F (37 C)  TempSrc:  Oral Oral Oral  Resp:  16 18 18   Weight:   50.6 kg (111 lb 8.8 oz)   SpO2:  98% 99% 96%   Physical Exam General: emaciated, non responsive to deep sternal rub Heart:RRR Lungs: no wheezes or rales Abdomen: soft Extremities: large dry necrotic wounds on upper left posterior thigh, left great toe/heel dark, dry gangrene on several left finger Dialysis Access: right I-J Foley catheter with blood tinged urine  Dialysis Orders:  TTS @ [facility]  4 hrs 51.5 kg 2K/2.25Ca 400/A1.5 Heparin 1800 U R IJ catheter  No Hectorol Aranesp 60 mcg on Tues No Venofer  Additional Objective Labs: Basic Metabolic Panel:  Recent Labs Lab 03/19/14 2253 03/20/14 0314  NA 143 142  K 3.0* 3.4*  CL 100 98  CO2 29 25  GLUCOSE 149* 167*  BUN 13 15  CREATININE 1.41* 1.60*  CALCIUM 8.7 8.7   Liver Function Tests:  Recent Labs Lab 03/20/14 0314  AST 17  ALT 11  ALKPHOS 130*  BILITOT 0.8  PROT 6.2  ALBUMIN 1.7*   CBC:  Recent Labs Lab 03/19/14 2253 03/20/14 0314  WBC 16.3* 16.3*  NEUTROABS 14.5*  --   HGB 9.7* 9.7*  HCT 30.8* 31.8*  MCV 83.7 85.0  PLT 240 243   BCBG:  Recent Labs Lab 03/20/14 0126  GLUCAP 145*   Medications: . sodium chloride 20 mL/hr at 03/19/14 2219   . amLODipine  10 mg Oral Daily  . antiseptic oral rinse  15 mL Mouth Rinse BID  . atorvastatin  40 mg Oral Daily  . ciprofloxacin  400 mg Intravenous Q24H  . clopidogrel  75 mg Oral Daily  . [START ON 03/22/2014] darbepoetin (ARANESP) injection - DIALYSIS  60 mcg Intravenous Q Tue-HD  . gabapentin  300 mg Oral TID  . heparin  5,000 Units Subcutaneous 3 times per day  . isosorbide mononitrate  15 mg Oral Daily  . lanthanum  500 mg Oral TID WC  . mirtazapine  7.5 mg Oral QHS  . multivitamin  1 tablet Oral QHS  . pantoprazole  40 mg Oral Daily   . [START ON 03/22/2014] vancomycin  500 mg Intravenous Q T,Th,Sa-HD

## 2014-03-21 NOTE — Progress Notes (Signed)
Pt. Daughter called to return call from Dr. Rito Ehrlich. Pt daughter reports that if patient is to be discharged from hospital that she needs to be discharged prior to HD scheduled at 11 am tomorrow as she does not want her mother dialyzed here. Pt daughter request return call from on call MD. Page to MD on call with call back. Will follow up with Dr. Rito Ehrlich in the am for follow up discharge instructions. Logann Whitebread Nils Flack, RN

## 2014-03-21 NOTE — Progress Notes (Signed)
Return call to pt daughter to notify that on call MD would notify Dr. Rito Ehrlich to follow up with discharge instructions in the am. Pt daughter agreeable. Brigette Hopfer Nils Flack

## 2014-03-21 NOTE — Progress Notes (Signed)
[facility] PROGRESS NOTE  [name] ZOX:[id number] DOB: 10/04/1941 DOA: 03/19/2014  PCP: Michiel Sites, MD  Brief HPI: [name] is a 73 y.o. female with a past medical history of coronary artery disease, hypertension, diabetes mellitus, CHF, DVT history, ESRD on hemodialysis, AKA, peripheral vascular disease with multiple decubiti ulcer and multiple digits involving dry gangrene following with vascular surgery and wound care. Patient presented with altered mental status after undergoing dialysis. Patient was admitted in a nursing home after her recent hospitalization in April and was discharged last month from the nursing home. She then had hospitalization at [facility] in end of May. She was briefly on hospice but patient expresed a wish to continue HD and so was discharged from hospice.  Past medical history:  Past Medical History  Diagnosis Date  . CAD (coronary artery disease)   . Hypertension   . Diabetes mellitus     type 2 insulin dependent  . Hyperlipidemia   . CHF (congestive heart failure)   . Cellulitis     left foot  . Ulcer     diabetic ulcer on left foot  . DVT (deep venous thrombosis)     peroneal vein  . Peripheral vascular disease   . ESRD on hemodialysis   . Anemia   . Insomnia     Consultants: Nephrology  Procedures: HD TTS  Antibiotics: Vanc 6/7 Cipro 6/7  Subjective: Patient more awake today. Complaints of pain all over. No shortness of breath.  Objective: Vital Signs  Filed Vitals:   03/20/14 1741 03/20/14 2046 03/21/14 0535 03/21/14 0900  BP: 149/75 110/75 146/42 130/67  Pulse: 92 87 73 91  Temp: 97.7 F (36.5 C) 98.3 F (36.8 C) 98.6 F (37 C) 97.5 F (36.4 C)  TempSrc: Oral Oral Oral Oral  Resp: 16 18 18 14   Weight:  50.6 kg (111 lb 8.8 oz)    SpO2: 98% 99% 96% 95%    Intake/Output Summary (Last 24 hours) at 03/21/14 1443 Last data filed at 03/21/14 1241  Gross per 24 hour  Intake      0 ml  Output       5 ml  Net     -5 ml   Filed Weights   03/19/14 2147 03/20/14 0124 03/20/14 2046  Weight: 45.768 kg (100 lb 14.4 oz) 49.8 kg (109 lb 12.6 oz) 50.6 kg (111 lb 8.8 oz)    General appearance: More alert today.  Resp: clear to auscultation bilaterally Cardio: regular rate and rhythm, S1, S2 normal, no murmur, click, rub or gallop GI: soft, non-tender; bowel sounds normal; no masses,  no organomegaly Extremities: right AKA. Mild edema noted LEft LE. Multiple dry gangrenes involving fingers of left hand Skin: Multiple areas of necrosis noted all over. especially fingers, abdomen, left toes, back. left thigh. Sacral decube also noted. Neurologic: Grossly normal   Lab Results:  Basic Metabolic Panel:  Recent Labs Lab 03/19/14 2253 03/20/14 0314  NA 143 142  K 3.0* 3.4*  CL 100 98  CO2 29 25  GLUCOSE 149* 167*  BUN 13 15  CREATININE 1.41* 1.60*  CALCIUM 8.7 8.7   Liver Function Tests:  Recent Labs Lab 03/20/14 0314  AST 17  ALT 11  ALKPHOS 130*  BILITOT 0.8  PROT 6.2  ALBUMIN 1.7*   CBC:  Recent Labs Lab 03/19/14 2253 03/20/14 0314  WBC 16.3* 16.3*  NEUTROABS 14.5*  --   HGB 9.7* 9.7*  HCT 30.8* 31.8*  MCV 83.7 85.0  PLT 240 243   BNP (last 3 results)  Recent Labs  08/17/13 1603 12/22/13 1809 01/23/14 1140  PROBNP 28521.0* >70000.0* >70000.0*   CBG:  Recent Labs Lab 03/20/14 0126  GLUCAP 145*    Recent Results (from the past 240 hour(s))  URINE CULTURE     Status: None   Collection Time    03/19/14 10:27 PM      Result Value Ref Range Status   Specimen Description URINE, CATHETERIZED   Final   Special Requests NONE   Final   Culture  Setup Time     Final   Value: 03/20/2014 16:24     Performed at [facility]   Colony Count PENDING   Incomplete   Culture     Final   Value: Culture reincubated for better growth     Performed at [facility]   Report Status PENDING   Incomplete  CULTURE, BLOOD (ROUTINE X 2)     Status: None    Collection Time    03/19/14 10:53 PM      Result Value Ref Range Status   Specimen Description BLOOD LEFT EJ   Final   Special Requests BOTTLES DRAWN AEROBIC AND ANAEROBIC 10CC   Final   Culture  Setup Time     Final   Value: 03/20/2014 13:45     Performed at [facility]   Culture     Final   Value:        BLOOD CULTURE RECEIVED NO GROWTH TO DATE CULTURE WILL BE HELD FOR 5 DAYS BEFORE ISSUING A FINAL NEGATIVE REPORT     Performed at [facility]   Report Status PENDING   Incomplete  MRSA PCR SCREENING     Status: None   Collection Time    03/20/14  1:04 AM      Result Value Ref Range Status   MRSA by PCR NEGATIVE  NEGATIVE Final   Comment:            The GeneXpert MRSA Assay (FDA     approved for NASAL specimens     only), is one component of a     comprehensive MRSA colonization     surveillance program. It is not     intended to diagnose MRSA     infection nor to guide or     monitor treatment for     MRSA infections.  CULTURE, BLOOD (ROUTINE X 2)     Status: None   Collection Time    03/20/14  5:45 AM      Result Value Ref Range Status   Specimen Description BLOOD RIGHT HAND   Final   Special Requests BOTTLES DRAWN AEROBIC ONLY 3CC   Final   Culture  Setup Time     Final   Value: 03/20/2014 13:46     Performed at [facility]   Culture     Final   Value:        BLOOD CULTURE RECEIVED NO GROWTH TO DATE CULTURE WILL BE HELD FOR 5 DAYS BEFORE ISSUING A FINAL NEGATIVE REPORT     Performed at [facility]   Report Status PENDING   Incomplete      Studies/Results: Dg Chest 2 View  03/19/2014   CLINICAL DATA:  Diarrhea, rectal pain, history of CHF.  EXAM: CHEST  2 VIEW  COMPARISON:  Chest radiograph January 23, 2014.  FINDINGS: Cardiac silhouette is mildly enlarged,  similar. Mildly calcified aortic knob. Status post median sternotomy for CABG. Similar COPD with biapical scarring/ bullous changes and pleural thickening. Small right pleural  effusion. No pneumothorax.  Tunneled dialysis catheter via right internal jugular venous approach with distal tip projecting cavoatrial junction. Mild degenerative change of the shoulders and thoracic spine. Severe calcification of the abdominal vessels partially imaged.  IMPRESSION: Mild cardiomegaly, COPD with small right pleural effusion.   Electronically Signed   By: Awilda Metro   On: 03/19/2014 23:41   Ct Head Wo Contrast  03/19/2014   CLINICAL DATA:  Less responsive than normal.  EXAM: CT HEAD WITHOUT CONTRAST  TECHNIQUE: Contiguous axial images were obtained from the base of the skull through the vertex without intravenous contrast.  COMPARISON:  CT of the head January 23, 2014.  FINDINGS: The ventricles and sulci are normal for age. No intraparenchymal hemorrhage, mass effect nor midline shift. Confluent supratentorial white matter hypodensities are nonspecific though suggest sequelae of chronic small vessel ischemic disease. No acute large vascular territory infarcts. Remote right caudate lacunar infarct.  No abnormal extra-axial fluid collections. Basal cisterns are patent. Severe calcific atherosclerosis of the carotid siphons.  No skull fracture. The included ocular globes and orbital contents are non-suspicious. Fluid filled mildly expanded sella, unchanged. The mastoid aircells and included paranasal sinuses are well-aerated. Patient is edentulous. Marked scalp calcifications.  IMPRESSION: No acute intracranial process.  Moderate global brain atrophy with moderate to severe white matter changes suggesting chronic small vessel ischemic disease. Remote right caudate lacunar infarct.   Electronically Signed   By: Awilda Metro   On: 03/19/2014 23:30    Medications:  Scheduled: . amLODipine  10 mg Oral Daily  . antiseptic oral rinse  15 mL Mouth Rinse BID  . atorvastatin  40 mg Oral Daily  . ciprofloxacin  400 mg Intravenous Q24H  . clopidogrel  75 mg Oral Daily  . [START ON 03/22/2014]  darbepoetin (ARANESP) injection - DIALYSIS  60 mcg Intravenous Q Tue-HD  . feeding supplement (NEPRO CARB STEADY)  237 mL Oral BID BM  . feeding supplement (PRO-STAT SUGAR FREE 64)  30 mL Oral TID WC  . gabapentin  300 mg Oral TID  . heparin  5,000 Units Subcutaneous 3 times per day  . isosorbide mononitrate  15 mg Oral Daily  . lanthanum  500 mg Oral TID WC  . mirtazapine  7.5 mg Oral QHS  . multivitamin  1 tablet Oral QHS  . pantoprazole  40 mg Oral Daily  . sodium hypochlorite   Irrigation q morning - 10a  . [START ON 03/22/2014] vancomycin  500 mg Intravenous Q T,Th,Sa-HD   Continuous: . sodium chloride 20 mL/hr at 03/19/14 2219   ZOX:[id number], ondansetron (ZOFRAN) IV, ondansetron, oxyCODONE, traMADol  Assessment/Plan:  Principal Problem:   Acute encephalopathy Active Problems:   Peripheral vascular disease, unspecified   S/P CABG x 5   Chronic systolic heart failure   COPD (chronic obstructive pulmonary disease)   Essential hypertension   Anemia   DM type 2, uncontrolled, with renal complications   UTI (lower urinary tract infection)   Sacral decubitus ulcer   Gangrene of finger   S/P AKA (above knee amputation)   VTE (venous thromboembolism)   Infected wound    Acute encephalopathy  Seems to be improving. Probably due to UTI but patient potentially has multiple other reasons to be confused including sepsis, medications. No neurological deficits noted.   UTI She still makes urine and UA was  abnormal. Culture is pending. Continue Cipro.   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 She was supposed to f/u with Dr. Lajoyce Corners and Dr. Merlyn Lot for amputations but per daughter they are thinking about it due to risk of surgery. She has very poor prognosis considering rapid worsening of these lesions. There is no good option for her. Providers at [facility] felt the same way. Daughter understands. She doesn't  want [name] to aggressively pursue this issue currently.  Possible Infected Decubitus ulcer  On Vanc. Wound care. Will likely be able to transition to oral antibiotics on discharge.  ESRD on HD  Nephrology following. Patient wants to continue HD per daughter. This prevents her from going under hospice care. Daughter understands her poor prognosis and wants to continue HD till Nephrology states otherwise. To be dialysed Tuesday.  Hypertension  Continue home antihypertensive medications.   Daughter not interested in speaking with palliative medicine. She understand her mother has poor prognosis and may benefit from being on hospice. But as long as patient wants dialysis she wants to respect her wishes. She will like for her to come back home as soon as she is able to.  Code Status: DNR/DNI  DVT Prophylaxis: Heparin    Family Communication: Discussed with her daughter on 6/7, who is also her Medical POA, [name] [phone]. Disposition Plan: Will return home with daughter when she is medically ready. Anticipate DC in 1-2 days.    LOS: 2 days   Osvaldo Shipper  [facility] Pager [phone] 03/21/2014, 2:43 PM  If 8PM-8AM, please contact night-coverage at [email or web address], password [email or web address]   Disclaimer: This note was dictated with voice recognition software. Similar sounding words can inadvertently be transcribed and may not be corrected upon review.

## 2014-03-21 NOTE — Evaluation (Signed)
Physical Therapy Evaluation Patient Details Name: [name] MRN: [id number] DOB: 1941/04/29 Today's Date: 03/21/2014   History of Present Illness  Admitted with AMS and multiple wounds; was in a nursing home after her recent hospitalization in April and was discharged last month from the nursing home. Patient went for hemodialysis and since then has been less responsive. Patient has been becoming progressively weak and lethargic and complained of increasing pain in her fingers as well as in her back. Family called EMS due to mental status changes.  Clinical Impression   Pt admitted with above. Pt currently with functional limitations due to the deficits listed below (see PT Problem List).  Pt will benefit from skilled PT to increase their independence and safety with mobility to allow discharge to the venue listed below.   Difficult dc plan, and we need more information re: home equipment and situation and available assist; Equipment recommendations outlined below are based on discharging back to home; Given current functional status, dc'ing to SNF is an appropriate option as well  Will continue to follow on a trial basis, depending on [name]'s ability to participate in PT; Will also take MD's lead re: if and how PT can fit into Goals of Care at this point     Follow Up Recommendations Supervision/Assistance - 24 hour;Home health PT    Equipment Recommendations  Hospital bed;Wheelchair (measurements PT);Wheelchair cushion (measurements PT) (air mattress overlay; hoyer lift; ambulance transport home)    Recommendations for Other Services       Precautions / Restrictions Precautions Precautions: Fall Precaution Comments: Multiple wounds: sacral, L trochanter, bilateral thigh and abdomen wounds that look calciphylactic, L heel, L hand digits Required Braces or Orthoses: Other Brace/Splint Other Brace/Splint: Prevalon boot L foot      Mobility  Bed Mobility Overal bed mobility: +2  for physical assistance;Needs Assistance Bed Mobility: Rolling;Sidelying to Sit;Sit to Sidelying Rolling: Max assist Sidelying to sit: +2 for physical assistance;Total assist     Sit to sidelying: +2 for physical assistance;Total assist General bed mobility comments: Pt stated she would like to sit EOB to stretch; Rolled R and L with max assist; cues and hand-over-hand assist to reach for rails, however noted decr ability to grip and pull on rails for rolling; +2 assist to sit up to EOB, support given at pelvic and houlder girdles to sit up  Transfers                    Ambulation/Gait                Stairs            Wheelchair Mobility    Modified Rankin (Stroke Patients Only)       Balance Overall balance assessment: Needs assistance Sitting-balance support: Bilateral upper extremity supported;Feet supported (L foot/R AKA) Sitting balance-Leahy Scale: Zero Sitting balance - Comments: Required mod to max assist and support posteriorly while sitting at EOB; Briefly performed gentle chest opening stretches Postural control: Posterior lean                                   Pertinent Vitals/Pain Gimace with movement; patient repositioned for comfort     Home Living Family/patient expects to be discharged to:: Private residence Living Arrangements: Children (Daughter) Available Help at Discharge: Family Type of Home: Apartment Home Access: Level entry     Home Layout: One level  Home Equipment:  (Need mroe info re: home equipment)      Prior Function Level of Independence: Needs assistance   Gait / Transfers Assistance Needed: Not exactly sure; likely has been nonambulatory since AKA  ADL's / Homemaking Assistance Needed: Dependent for most ADLs  Comments: Amount unknown     Hand Dominance   Dominant Hand: Right    Extremity/Trunk Assessment   Upper Extremity Assessment: Defer to OT evaluation (dry gangrene 3 digits of L  hand)           Lower Extremity Assessment: LLE deficits/detail (R prev AKA)   LLE Deficits / Details: Very weak; Unable to lift LLE against gravity; Max assist with heel slides;   Cervical / Trunk Assessment: Other exceptions  Communication   Communication: Other (comment) (Able to answer yes/no questions)  Cognition Arousal/Alertness: Lethargic Behavior During Therapy: WFL for tasks assessed/performed Overall Cognitive Status: No family/caregiver present to determine baseline cognitive functioning                      General Comments General comments (skin integrity, edema, etc.): Multiple wounds; Please see also WOC RN note of this date for details    Exercises        Assessment/Plan    PT Assessment Patient needs continued PT services (Pending ability to participate)  PT Diagnosis Generalized weakness;Acute pain   PT Problem List Decreased strength;Decreased range of motion;Decreased activity tolerance;Decreased balance;Decreased mobility;Decreased knowledge of use of DME;Pain;Decreased knowledge of precautions  PT Treatment Interventions Functional mobility training;Therapeutic activities;Therapeutic exercise;Balance training;Patient/family education   PT Goals (Current goals can be found in the Care Plan section) Acute Rehab PT Goals PT Goal Formulation: Patient unable to participate in goal setting Time For Goal Achievement: 04/05/2014 Potential to Achieve Goals: Fair    Frequency Min 2X/week (on a trial basis)   Barriers to discharge   Not sure how well pt's needs are being met at home given her sacral decubitus wound    Co-evaluation               End of Session   Activity Tolerance: Patient tolerated treatment well Patient left: in bed;with call bell/phone within reach;Other (comment) (R semi-sidelie)           Time: 2641-5830 PT Time Calculation (min): 13 min   Charges:   PT Evaluation $Initial PT Evaluation Tier I: 1 Procedure PT  Treatments $Therapeutic Activity: 8-22 mins   PT G Codes:          [facility] 03/21/2014, 4:31 PM  Roney Marion, PT  Acute Rehabilitation Services Pager [phone] Office [phone]

## 2014-03-21 NOTE — Consult Note (Signed)
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 She has 7 wounds over the inner and outer thighs and abdomen.   1. Unstageable Pressure Ulcer left heel: 3cm x 5cm x 0 2. 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 Unstagable Pressure ulcer left trochanter: 3cm x 3cm x 0.2cm  4. ? Calciphylaxis left hip: 7cm x 4cm x 0 5.  Arterial ulceration left great toe: 3cm x 2cm x 0 6. ? Calciphylaxis right inner thigh: 9cm x 3cm x 0 7. ? Calciphylaxis abdomen LLQ: 8cm x 5cm x 0 8. ? Calciphylaxis left outer thigh 15cm x 9cm x 0.2cm proximal; 10cm x 4cm x 0cm distal  9. ? Calciphylaxis right posterior thigh: 4cm x 11cm x 0.2cm 10. Left hand with dry gangrene 1st, 3rd, and 4th digits  Pressure Ulcer POA: Yes x 3  Measurement:see above Wound bed:  most of the areas suspicious for calciphylaxis are dry, she does have some drainage noted from the left thigh and right posterior thigh.  The heel ulcer is stable, non fluctuant.   The digits are very dry and may auto amputate at some point.    The sacrum is 80% necrotic tissue, fluctuant at some points with palpable coccyx bone. She has an unstagable area on the left trochanter that is 100% soft yellow slough. Her  sacrum is the worst of her wounds currently and I have discussed the care of this wound with Dr. Rito Ehrlich.  Drainage (amount, consistency, odor) heavy, foul drainage from the sacrum Periwound: erythema noted at several of the sites on the thighs and abdomen  Dressing procedure/placement/frequency: All dry sites should be left open to air to keep them dry and stable if possible.  Can use foam dressings to cover the sites draining.  Add air mattress for pressure redistribution for the sacrum. Add Prevalon boot for the left heel.  No topical  care for the digits of the left hand and the the left toe.   Will begin Dakin's solution for debridement of the sacral wound since she is really not a surgical candidate.  WOC will follow along with you for updates and changes in the wound care as needed.  Christepher Melchior Peoa RN,CWOCN [phone]

## 2014-03-21 NOTE — Progress Notes (Addendum)
INITIAL NUTRITION ASSESSMENT  DOCUMENTATION CODES Per approved criteria  -Severe malnutrition in the context of chronic illness   INTERVENTION: Remove renal restrictions per Renal PA-C, Bard Herbert. Downgrade to Dysphagia 1 with thin liquids, per family request. Add 30 ml Prostat po TID, each supplement provides 100 kcal and 15 grams protein. Consider 2 week therapy of 220 mg zinc sulfate daily and 500 mg vitamin C BID supplementation to support wound healing. MD - recommend monitoring of magnesium, potassium, and phosphorus daily for at least 3 days, MD to replete as needed, as pt is at risk for refeeding syndrome given severe malnurition. Recommend Renal MVI daily. RD to continue to follow nutrition care plan.  NUTRITION DIAGNOSIS: Increased nutrient needs related to wound healing as evidenced by estimated needs.   Goal: Intake to meet >90% of estimated nutrition needs.  Monitor:  weight trends, lab trends, I/O's, PO intake, supplement tolerance  Reason for Assessment: MD Consult for Assessment  73 y.o. female  Admitting Dx: Acute encephalopathy  ASSESSMENT: PMHx significant for CAD, HTN, DM, CHF, ESRD on HD, AKA, PVD, multiple decubitus ulcers, dry gangrene. Patient was admitted in a nursing home after her recent hospitalization in April and was discharged last month from the nursing home. She then had hospitalization at [facility] in end of May. She was briefly on hospice but patient expresed a wish to continue HD and so was discharged from hospice. Admitted with AMS. Work-up reveals UTI and infected wounds.   Per chart, daughter is not interested in speaking with palliative medicine. WOC RN saw pt today - pt has 7 wounds on her inner and outer thighs, as well as abdomen.  Dry weight per renal: 51.5 kg. Current weight is 72.5% of usual weight from 3 months ago. Some weight loss related to recent amputation on 01/12/14. Since amputation, patient has lost another 23 lbs =  17% weight loss.  Currently ordered for Renal diet (RD consulted for poor PO intake with written permission to liberalize diet per Bard Herbert, PA-C), Nepro Shake PO BID, remeron. No family at bedside - per RN, however, pt is not eating well currently because patient needs pureed foods. RD to downgrade diet accordingly.  Pt currently having dressings changed - RD unable to complete physical assessment at this time.  Pt meets criteria for severe MALNUTRITION in the context of chronic illness as evidenced by 17% wt loss x 2 months and intake of <75% x at least 1 month.  Potassium low at 3.4 No phosphorus available  Height: Ht Readings from Last 1 Encounters:  02/15/14 5\' 5"  (1.651 m)    Weight: Wt Readings from Last 1 Encounters:  03/20/14 111 lb 8.8 oz (50.6 kg)    Ideal Body Weight: 120 lb (adjusted for AKA)  % Ideal Body Weight: 92.5%  Wt Readings from Last 25 Encounters:  03/20/14 111 lb 8.8 oz (50.6 kg)  02/15/14 118 lb (53.524 kg)  02/02/14 118 lb 6.2 oz (53.7 kg)  01/15/14 134 lb 0.6 oz (60.8 kg)  01/15/14 134 lb 0.6 oz (60.8 kg)  01/15/14 134 lb 0.6 oz (60.8 kg)  01/15/14 134 lb 0.6 oz (60.8 kg)  12/30/13 153 lb (69.4 kg)  12/22/13 152 lb 9.6 oz (69.219 kg)  12/14/13 153 lb 14.4 oz (69.809 kg)  11/11/13 148 lb (67.132 kg)  10/04/13 139 lb (63.05 kg)  09/07/13 138 lb (62.596 kg)  09/07/13 138 lb (62.596 kg)  09/03/13 138 lb 9.6 oz (62.869 kg)  08/25/13 136 lb  1.6 oz (61.735 kg)  03/30/13 157 lb 11.2 oz (71.532 kg)  03/02/13 157 lb 6.4 oz (71.396 kg)  01/13/13 164 lb (74.39 kg)  05/13/12 170 lb 4.8 oz (77.248 kg)  10/29/11 166 lb (75.297 kg)  09/03/11 154 lb (69.854 kg)  08/05/11 154 lb 9.6 oz (70.126 kg)  07/16/11 157 lb (71.215 kg)     Usual Body Weight: 153 lb  % Usual Body Weight: 72.5%  BMI:  Body mass index is 18.56 kg/(m^2). WNL  Estimated Nutritional Needs: Kcal: 1700 - 2000 Protein: at least 105 g daily Fluid: 1.2 liters  Skin:  1.  Unstageable Pressure Ulcer left heel: 3cm x 5cm x 0  2. 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 Unstagable Pressure ulcer left trochanter: 3cm x 3cm x 0.2cm  4. ? Calciphylaxis left hip: 7cm x 4cm x 0  5. Arterial ulceration left great toe: 3cm x 2cm x 0  6. ? Calciphylaxis right inner thigh: 9cm x 3cm x 0  7. ? Calciphylaxis abdomen LLQ: 8cm x 5cm x 0  8. ? Calciphylaxis left outer thigh 15cm x 9cm x 0.2cm proximal; 10cm x 4cm x 0cm distal  9. ? Calciphylaxis right posterior thigh: 4cm x 11cm x 0.2cm  10. Left hand with dry gangrene 1st, 3rd, and 4th digits  Diet Order: Renal w/ 1200 ml Fluid Restriction  EDUCATION NEEDS: -Education not appropriate at this time   Intake/Output Summary (Last 24 hours) at 03/21/14 1329 Last data filed at 03/21/14 1241  Gross per 24 hour  Intake      0 ml  Output      5 ml  Net     -5 ml    Last BM: 6/7  Labs:   Recent Labs Lab 03/19/14 2253 03/20/14 0314  NA 143 142  K 3.0* 3.4*  CL 100 98  CO2 29 25  BUN 13 15  CREATININE 1.41* 1.60*  CALCIUM 8.7 8.7  GLUCOSE 149* 167*    CBG (last 3)   Recent Labs  03/20/14 0126  GLUCAP 145*    Scheduled Meds: . amLODipine  10 mg Oral Daily  . antiseptic oral rinse  15 mL Mouth Rinse BID  . atorvastatin  40 mg Oral Daily  . ciprofloxacin  400 mg Intravenous Q24H  . clopidogrel  75 mg Oral Daily  . [START ON 03/22/2014] darbepoetin (ARANESP) injection - DIALYSIS  60 mcg Intravenous Q Tue-HD  . feeding supplement (NEPRO CARB STEADY)  237 mL Oral BID BM  . gabapentin  300 mg Oral TID  . heparin  5,000 Units Subcutaneous 3 times per day  . isosorbide mononitrate  15 mg Oral Daily  . lanthanum  500 mg Oral TID WC  . mirtazapine  7.5 mg Oral QHS  . pantoprazole  40 mg Oral Daily  . sodium hypochlorite   Irrigation q morning - 10a  . [START ON 03/22/2014] vancomycin  500 mg Intravenous Q T,Th,Sa-HD     Continuous Infusions: . sodium chloride 20 mL/hr at 03/19/14 2219    Past Medical History  Diagnosis Date  . CAD (coronary artery disease)   . Hypertension   . Diabetes mellitus     type 2 insulin dependent  . Hyperlipidemia   . CHF (congestive heart failure)   . Cellulitis     left foot  . Ulcer     diabetic ulcer  on left foot  . DVT (deep venous thrombosis)     peroneal vein  . Peripheral vascular disease   . ESRD on hemodialysis   . Anemia   . Insomnia     Past Surgical History  Procedure Laterality Date  . Tubal ligation    . Pr vein bypass graft,aorto-fem-pop  06-26-11    1. PATENT LEFT FEMORAL-POPLITEAL BYPASS GRAFT W/NO EVEIDENCE OF STENOSIS. 2.VELOCITIES OF GREATER THAN 200 CM/'s NOTED ON PREVIOUS EXAM 10/03/11 WERE NOT ADEQUATELY VISULAIZED DURING THIS EXAM  . Coronary artery bypass graft  04/25/08    Dr. Tyrone Sage, CABG x 5   . Nm myoview ltd  01/13/13    LEXISCAN; LV WALL MOTION: LVEF 44%, INFERIOR AKINESIS, ANTEROAPICAL HYPOKINESIS  . Cardiac catheterization  04/22/08    SEVERE 3-VESSEL DISEASE CAD., CABG X 5 04/25/08 WITH DR. [id number]  . Av fistula placement Left 09/07/2013    Procedure: ARTERIOVENOUS (AV) FISTULA CREATION;  Surgeon: Sherren Kerns, MD;  [facility];  Service: Vascular;  Laterality: Left;  . Ligation arteriovenous gortex graft Left 01/09/2014    Procedure: LIGATION ARTERIOVENOUS GORTEX GRAFT;  Surgeon: Sherren Kerns, MD;  Location: [facility] OR;  Service: Vascular;  Laterality: Left;  . Insertion of dialysis catheter Right 01/09/2014    Procedure: INSERTION OF DIALYSIS CATHETER;  Surgeon: Sherren Kerns, MD;  Location: [facility] OR;  Service: Vascular;  Laterality: Right;  Insertion of diatek to right internal Jugular artery.  . Amputation Right 01/12/2014    Procedure: AMPUTATION ABOVE KNEE- RIGHT;  Surgeon: Pryor Ochoa, MD;  Location: [facility] OR;  Service: Vascular;  Laterality: Right;  . 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

## 2014-03-22 LAB — BASIC METABOLIC PANEL
BUN: 35 mg/dL — ABNORMAL HIGH (ref 6–23)
CO2: 30 mEq/L (ref 19–32)
Calcium: 8 mg/dL — ABNORMAL LOW (ref 8.4–10.5)
Chloride: 97 mEq/L (ref 96–112)
Creatinine, Ser: 3.04 mg/dL — ABNORMAL HIGH (ref 0.50–1.10)
GFR calc Af Amer: 17 mL/min — ABNORMAL LOW (ref >90)
GFR calc non Af Amer: 14 mL/min — ABNORMAL LOW (ref >90)
Glucose, Bld: 225 mg/dL — ABNORMAL HIGH (ref 70–99)
Potassium: 3.3 mEq/L — ABNORMAL LOW (ref 3.7–5.3)
Sodium: 139 mEq/L (ref 137–147)

## 2014-03-22 LAB — CBC
HEMATOCRIT: 29.6 % — AB (ref 36.0–46.0)
HEMOGLOBIN: 9.1 g/dL — AB (ref 12.0–15.0)
MCH: 26.5 pg (ref 26.0–34.0)
MCHC: 30.7 g/dL (ref 30.0–36.0)
MCV: 86 fL (ref 78.0–100.0)
Platelets: 206 10*3/uL (ref 150–400)
RBC: 3.44 MIL/uL — ABNORMAL LOW (ref 3.87–5.11)
RDW: 26.5 % — ABNORMAL HIGH (ref 11.5–15.5)
WBC: 16.2 10*3/uL — ABNORMAL HIGH (ref 4.0–10.5)

## 2014-03-22 LAB — RENAL FUNCTION PANEL
ALBUMIN: 1.5 g/dL — AB (ref 3.5–5.2)
BUN: 34 mg/dL — ABNORMAL HIGH (ref 6–23)
CHLORIDE: 96 meq/L (ref 96–112)
CO2: 29 mEq/L (ref 19–32)
CREATININE: 3.04 mg/dL — AB (ref 0.50–1.10)
Calcium: 7.9 mg/dL — ABNORMAL LOW (ref 8.4–10.5)
GFR, EST AFRICAN AMERICAN: 17 mL/min — AB (ref >90)
GFR, EST NON AFRICAN AMERICAN: 14 mL/min — AB (ref >90)
Glucose, Bld: 224 mg/dL — ABNORMAL HIGH (ref 70–99)
Phosphorus: 4.2 mg/dL (ref 2.3–4.6)
Potassium: 3.4 mEq/L — ABNORMAL LOW (ref 3.7–5.3)
SODIUM: 139 meq/L (ref 137–147)

## 2014-03-22 LAB — GLUCOSE, CAPILLARY: Glucose-Capillary: 167 mg/dL — ABNORMAL HIGH (ref 70–99)

## 2014-03-22 LAB — HEPATITIS B SURFACE ANTIBODY,QUALITATIVE: Hep B S Ab: NEGATIVE

## 2014-03-22 LAB — HEPATITIS B SURFACE ANTIGEN: Hepatitis B Surface Ag: NEGATIVE

## 2014-03-22 MED ORDER — ALTEPLASE 2 MG IJ SOLR: 2.0000 mg | Freq: Once | INTRAMUSCULAR | Status: DC | PRN

## 2014-03-22 MED ORDER — LIDOCAINE HCL (PF) 1 % IJ SOLN: 5.0000 mL | INTRAMUSCULAR | Status: DC | PRN

## 2014-03-22 MED ORDER — HEPARIN SODIUM (PORCINE) 1000 UNIT/ML DIALYSIS: 1000.0000 [IU] | INTRAMUSCULAR | Status: DC | PRN

## 2014-03-22 MED ORDER — HEPARIN SODIUM (PORCINE) 1000 UNIT/ML DIALYSIS
20.0000 [IU]/kg | INTRAMUSCULAR | Status: DC | PRN
  Administered 2014-03-22: 1000 [IU] via INTRAVENOUS_CENTRAL

## 2014-03-22 MED ORDER — DARBEPOETIN ALFA-POLYSORBATE 60 MCG/0.3ML IJ SOLN
INTRAMUSCULAR | Status: AC
  Filled 2014-03-22: qty 0.3

## 2014-03-22 MED ORDER — LIDOCAINE-PRILOCAINE 2.5-2.5 % EX CREA: 1.0000 "application " | TOPICAL_CREAM | CUTANEOUS | Status: DC | PRN

## 2014-03-22 MED ORDER — SODIUM CHLORIDE 0.9 % IV SOLN: 100.0000 mL | INTRAVENOUS | Status: DC | PRN

## 2014-03-22 MED ORDER — PENTAFLUOROPROP-TETRAFLUOROETH EX AERO: 1.0000 "application " | INHALATION_SPRAY | CUTANEOUS | Status: DC | PRN

## 2014-03-22 MED ORDER — NEPRO/CARBSTEADY PO LIQD: 237.0000 mL | ORAL | Status: DC | PRN

## 2014-03-22 MED ORDER — TRAMADOL HCL 50 MG PO TABS
ORAL_TABLET | ORAL | Status: AC
  Administered 2014-03-22: 50 mg
  Filled 2014-03-22: qty 1

## 2014-03-22 NOTE — Progress Notes (Signed)
OT Cancellation Note  Patient Details Name: [name] MRN: [id number] DOB: Aug 18, 1941   Cancelled Treatment:    Reason Eval/Treat Not Completed: Patient at procedure or test/ unavailable. Pt at hemodialysis this morning, will re attempt later today as time allows/as appropriate  Lafe Garin 03/22/2014, 9:38 AM

## 2014-03-22 NOTE — Progress Notes (Signed)
[facility] Progress Note  Assessment/Plan: 1. AMS - believed to be sec to UTI ( + UA) or possibly decubitus ulcer; on IV Vancomycin & Cipro [facility] BC and urine cultures pending - if IV antibioitics needed at d/c would change cipro to po 2. Sacral ulcer - wound care, on empiric antibiotics. 3. Severe PVD with steal syndrome- multiple areas of dry gangrene, especially @ L hand,sec to steal; AVF @ LUA ligated 3/29. 4. 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 5. ESRD - HD on TTS @ [facility],  HD today - very difficult talking with her and getting her to express what she really wants. Explained to her that HD will not make her better and all these wounds will not heal. She does not wish to continue HD but has not expressed that to her daughter. I'm not sure daughter is ready to hear this. A conversation needs to be facilitated between the two of them, but given the pt's variable levels of alertness this is challenging.  6. Hypertension/volume - on Amlodipine;, no volume excess- below EDW; intake very  poor 7. Anemia - Hgb 9.7 on Aranesp 60 mcg on Tues; Fe 11%, but ferritin > 2600; CRP 11 8. Metabolic bone disease - , Calcitriol stopped Fosrenol with meals. - should not be on if really calciphylaxis 9. Severe portein malnutrition - Alb 1.7, renal diet, vitamin. Needs protein supplement; had RD consult -requires feeding; will sip nepro via straw - likes this  10. DM Type 2 - Lantus & SSI, no longer on meds. 11. Hx CAD - s/p CABG x 5 in 2009. 12. Disp - had recent SNF stay, discharged home then [facility]; she was discharged from hospice because she wished to continue HD; Daughter refuses palliative care her long term prognosis is extremely poor; it is inappropriate to prolong her sufferering with HD; However, her daughter is POA and wants HD to continue- until God takes her --see Dr. Malena CatholicSchertz's comments at the end of the consult note.    Rhonda SliderMartha B Bergman, PA-C [facility] Beeper [phone] 03/22/2014,8:16 AM  LOS: 3 days   Subjective:   Will do HD today. Does not wish to continue HD. Has not told her daughter.   Objective Filed Vitals:   03/21/14 0535 03/21/14 0900 03/21/14 2041 03/22/14 0428  BP: 146/42 130/67 133/62 138/73  Pulse: 73 91 69 112  Temp: 98.6 F (37 C) 97.5 F (36.4 C) 98.6 F (37 C) 97.3 F (36.3 C)  TempSrc: Oral Oral Oral Oral  Resp: 18 14 13 14   Weight:   50.712 kg (111 lb 12.8 oz)   SpO2: 96% 95% 100% 98%   Physical Exam General: sitting in bed  Heart: RRR Lungs: no wheezes or rales Abdomen: soft NT Extremities:  Right AKA, left lower no overt edema; necrotic multiple wounds groin, fingers, foot Dialysis Access: right I-J  Dialysis Orders:  TTS @ [facility]  4 hrs 51.5 kg 2K/2.25Ca 400/A1.5 Heparin 1800 U R IJ catheter  No Hectorol Aranesp 60 mcg on Tues No Venofer   Additional Objective Labs: Basic Metabolic Panel:  Recent Labs Lab 03/19/14 2253 03/20/14 0314  NA 143 142  K 3.0* 3.4*  CL 100 98  CO2 29 25  GLUCOSE 149* 167*  BUN 13 15  CREATININE 1.41* 1.60*  CALCIUM 8.7 8.7   Liver Function Tests:  Recent Labs Lab 03/20/14 0314  AST 17  ALT 11  ALKPHOS 130*  BILITOT 0.8  PROT 6.2  ALBUMIN 1.7*   CBC:  Recent Labs Lab 03/19/14 2253 03/20/14 0314  WBC 16.3* 16.3*  NEUTROABS 14.5*  --   HGB 9.7* 9.7*  HCT 30.8* 31.8*  MCV 83.7 85.0  PLT 240 243   Blood Culture    Component Value Date/Time   SDES BLOOD RIGHT HAND 03/20/2014 0545   SPECREQUEST BOTTLES DRAWN AEROBIC ONLY 3CC 03/20/2014 0545   CULT  Value:        BLOOD CULTURE RECEIVED NO GROWTH TO DATE CULTURE WILL BE HELD FOR 5 DAYS BEFORE ISSUING A FINAL NEGATIVE REPORT Performed at [facility] 03/20/2014 0545   REPTSTATUS PENDING 03/20/2014 0545   CBG:  Recent Labs Lab 03/20/14 0126  GLUCAP 145*  Medications: . sodium chloride 20 mL/hr at 03/19/14 2219   . amLODipine  10 mg Oral  Daily  . antiseptic oral rinse  15 mL Mouth Rinse BID  . atorvastatin  40 mg Oral Daily  . ciprofloxacin  400 mg Intravenous Q24H  . clopidogrel  75 mg Oral Daily  . darbepoetin (ARANESP) injection - DIALYSIS  60 mcg Intravenous Q Tue-HD  . feeding supplement (NEPRO CARB STEADY)  237 mL Oral BID BM  . feeding supplement (PRO-STAT SUGAR FREE 64)  30 mL Oral TID WC  . gabapentin  300 mg Oral TID  . heparin  5,000 Units Subcutaneous 3 times per day  . isosorbide mononitrate  15 mg Oral Daily  . lanthanum  500 mg Oral TID WC  . mirtazapine  7.5 mg Oral QHS  . multivitamin  1 tablet Oral QHS  . pantoprazole  40 mg Oral Daily  . sodium hypochlorite   Irrigation q morning - 10a  . vancomycin  500 mg Intravenous Q T,Th,Sa-HD   I have seen and examined this patient and agree with plan per Bard Herbert.  Pt seen on HD.  Ap 150 Vp 300.  BFR 400.  She is more alert but doesn't remember the conversation with Sharl Ma. Labs pend. Jorje Guild 03/22/2014 9:17 AM

## 2014-03-22 NOTE — Progress Notes (Signed)
[facility] PROGRESS NOTE  [name] [id number]:[id number] DOB: 1941/05/17 DOA: 03/19/2014  PCP: Michiel Sites, MD  Brief HPI: [name] is a 73 y.o. female with a past medical history of coronary artery disease, hypertension, diabetes mellitus, CHF, DVT history, ESRD on hemodialysis, AKA, peripheral vascular disease with multiple decubiti ulcer and multiple digits involving dry gangrene following with vascular surgery and wound care. Patient presented with altered mental status after undergoing dialysis. Patient was admitted in a nursing home after her recent hospitalization in April and was discharged last month from the nursing home. She then had hospitalization at [facility] in end of May. She was briefly on hospice but patient expresed a wish to continue HD and so was discharged from hospice.  Past medical history:  Past Medical History  Diagnosis Date  . CAD (coronary artery disease)   . Hypertension   . Diabetes mellitus     type 2 insulin dependent  . Hyperlipidemia   . CHF (congestive heart failure)   . Cellulitis     left foot  . Ulcer     diabetic ulcer on left foot  . DVT (deep venous thrombosis)     peroneal vein  . Peripheral vascular disease   . ESRD on hemodialysis   . Anemia   . Insomnia     Consultants: Nephrology  Procedures: HD TTS  Antibiotics: Vanc 6/7 Cipro 6/7  Subjective: Patient confused. Denies any pain today.   Objective: Vital Signs  Filed Vitals:   03/22/14 1030 03/22/14 1100 03/22/14 1130 03/22/14 1200  BP: 125/74 168/77 110/64 146/77  Pulse: 88 99 120 104  Temp:      TempSrc:      Resp:      Weight:      SpO2:    96%    Intake/Output Summary (Last 24 hours) at 03/22/14 1228 Last data filed at 03/21/14 1241  Gross per 24 hour  Intake      0 ml  Output      0 ml  Net      0 ml   Filed Weights   03/20/14 2046 03/21/14 2041 03/22/14 0851  Weight: 50.6 kg (111 lb 8.8 oz) 50.712 kg (111 lb 12.8 oz) 51.2 kg (112  lb 14 oz)    General appearance: More alert today.  Resp: clear to auscultation bilaterally Cardio: regular rate and rhythm, S1, S2 normal, no murmur, click, rub or gallop GI: soft, non-tender; bowel sounds normal; no masses,  no organomegaly Extremities: right AKA. Mild edema noted LEft LE. Multiple dry gangrenes involving fingers of left hand Skin: Multiple areas of necrosis noted all over. especially fingers, abdomen, left toes, back. left thigh. Sacral decube also noted. Neurologic: Grossly normal   Lab Results:  Basic Metabolic Panel:  Recent Labs Lab 03/19/14 2253 03/20/14 0314 03/22/14 0854  NA 143 142 139  139  K 3.0* 3.4* 3.3*  3.4*  CL 100 98 97  96  CO2 29 25 30  29   GLUCOSE 149* 167* 225*  224*  BUN 13 15 35*  34*  CREATININE 1.41* 1.60* 3.04*  3.04*  CALCIUM 8.7 8.7 8.0*  7.9*  PHOS  --   --  4.2   Liver Function Tests:  Recent Labs Lab 03/20/14 0314 03/22/14 0854  AST 17  --   ALT 11  --   ALKPHOS 130*  --   BILITOT 0.8  --   PROT 6.2  --   ALBUMIN 1.7*  1.5*   CBC:  Recent Labs Lab 03/19/14 2253 03/20/14 0314 03/22/14 0800  WBC 16.3* 16.3* 16.2*  NEUTROABS 14.5*  --   --   HGB 9.7* 9.7* 9.1*  HCT 30.8* 31.8* 29.6*  MCV 83.7 85.0 86.0  PLT 240 243 206   BNP (last 3 results)  Recent Labs  08/17/13 1603 12/22/13 1809 01/23/14 1140  PROBNP 28521.0* >70000.0* >70000.0*   CBG:  Recent Labs Lab 03/20/14 0126  GLUCAP 145*    Recent Results (from the past 240 hour(s))  URINE CULTURE     Status: None   Collection Time    03/19/14 10:27 PM      Result Value Ref Range Status   Specimen Description URINE, CATHETERIZED   Final   Special Requests NONE   Final   Culture  Setup Time     Final   Value: 03/20/2014 16:24     Performed at [facility] Count PENDING   Incomplete   Culture     Final   Value: YEAST Culture reincubated for better growth     Performed at [facility]   Report Status PENDING    Incomplete  CULTURE, BLOOD (ROUTINE X 2)     Status: None   Collection Time    03/19/14 10:53 PM      Result Value Ref Range Status   Specimen Description BLOOD LEFT EJ   Final   Special Requests BOTTLES DRAWN AEROBIC AND ANAEROBIC 10CC   Final   Culture  Setup Time     Final   Value: 03/20/2014 13:45     Performed at [facility]   Culture     Final   Value:        BLOOD CULTURE RECEIVED NO GROWTH TO DATE CULTURE WILL BE HELD FOR 5 DAYS BEFORE ISSUING A FINAL NEGATIVE REPORT     Performed at [facility]   Report Status PENDING   Incomplete  MRSA PCR SCREENING     Status: None   Collection Time    03/20/14  1:04 AM      Result Value Ref Range Status   MRSA by PCR NEGATIVE  NEGATIVE Final   Comment:            The GeneXpert MRSA Assay (FDA     approved for NASAL specimens     only), is one component of a     comprehensive MRSA colonization     surveillance program. It is not     intended to diagnose MRSA     infection nor to guide or     monitor treatment for     MRSA infections.  CULTURE, BLOOD (ROUTINE X 2)     Status: None   Collection Time    03/20/14  5:45 AM      Result Value Ref Range Status   Specimen Description BLOOD RIGHT HAND   Final   Special Requests BOTTLES DRAWN AEROBIC ONLY 3CC   Final   Culture  Setup Time     Final   Value: 03/20/2014 13:46     Performed at [facility]   Culture     Final   Value:        BLOOD CULTURE RECEIVED NO GROWTH TO DATE CULTURE WILL BE HELD FOR 5 DAYS BEFORE ISSUING A FINAL NEGATIVE REPORT     Performed at [facility]   Report Status PENDING   Incomplete  Studies/Results: No results found.  Medications:  Scheduled: . amLODipine  10 mg Oral Daily  . antiseptic oral rinse  15 mL Mouth Rinse BID  . atorvastatin  40 mg Oral Daily  . ciprofloxacin  400 mg Intravenous Q24H  . clopidogrel  75 mg Oral Daily  . darbepoetin (ARANESP) injection - DIALYSIS  60 mcg Intravenous Q Tue-HD  .  feeding supplement (NEPRO CARB STEADY)  237 mL Oral BID BM  . feeding supplement (PRO-STAT SUGAR FREE 64)  30 mL Oral TID WC  . gabapentin  300 mg Oral TID  . heparin  5,000 Units Subcutaneous 3 times per day  . isosorbide mononitrate  15 mg Oral Daily  . lanthanum  500 mg Oral TID WC  . mirtazapine  7.5 mg Oral QHS  . multivitamin  1 tablet Oral QHS  . pantoprazole  40 mg Oral Daily  . sodium hypochlorite   Irrigation q morning - 10a  . traMADol      . vancomycin  500 mg Intravenous Q T,Th,Sa-HD   Continuous: . sodium chloride 20 mL/hr at 03/19/14 2219   XLK:[id number]:nitroGLYCERIN, ondansetron (ZOFRAN) IV, ondansetron, oxyCODONE, traMADol  Assessment/Plan:  Principal Problem:   Acute encephalopathy Active Problems:   Peripheral vascular disease, unspecified   S/P CABG x 5   Chronic systolic heart failure   COPD (chronic obstructive pulmonary disease)   Essential hypertension   Anemia   DM type 2, uncontrolled, with renal complications   UTI (lower urinary tract infection)   Sacral decubitus ulcer   Gangrene of finger   S/P AKA (above knee amputation)   VTE (venous thromboembolism)   Infected wound    Acute encephalopathy  Seems to be improving and probably back to baseline. Probably due to UTI but patient potentially has multiple other reasons to be confused including sepsis, medications. No neurological deficits noted.   UTI She still makes urine and UA was abnormal. Culture is pending. Continue Cipro. Since she has been on multiple antibiotics recently would like to know culture results before discharge. She was discharged from [facility] on 5/28 with Cipro and Flagyl for one week.  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 She was supposed to f/u with Dr. Lajoyce Cornersuda and Dr. Merlyn LotKuzma for amputations but per daughter they are thinking about it due to risk of surgery. She has very poor prognosis considering rapid  worsening of these lesions. There is no good option for her. She was at [facility] ago[facility]. Daughter understands. She doesn't want us to aggressively pursue this issue currently. Daughter states patient will [facility] at follow up.  Possible Infected Decubitus ulcer  On Vanc. Wound care. Will likely be able to transition to oral antibiotics on discharge.  ESRD on HD  Nephrology following. Patient wants to continue HD per daughter. This prevents her from going under hospice care. Daughter understands her poor prognosis and wants to continue HD till Nephrology states otherwise. To be dialysed Tuesday.  Hypertension  Continue home antihypertensive medications.   Daughter not interested in speaking with palliative medicine. She understand her mother has poor prognosis and may benefit from being on hospice. But as long as patient wants dialysis she wants to respect her wishes. She will like for her to come back home as soon as she is able to.  Code Status: DNR/DNI  DVT Prophylaxis: Heparin    Family  Communication: Discussed with her daughter on 6/9, who is also her Medical POA, [name] [phone]. Disposition Plan: Will return home with daughter when she is medically ready. Anticipate DC 6/10 if urine culture report is available.     LOS: 3 days   Osvaldo Shipper  [facility] Pager [phone] 03/22/2014, 12:28 PM  If 8PM-8AM, please contact night-coverage at [email or web address], password [email or web address]   Disclaimer: This note was dictated with voice recognition software. Similar sounding words can inadvertently be transcribed and may not be corrected upon review.

## 2014-03-23 DIAGNOSIS — L8990 Pressure ulcer of unspecified site, unspecified stage: Secondary | ICD-10-CM

## 2014-03-23 DIAGNOSIS — E43 Unspecified severe protein-calorie malnutrition: Secondary | ICD-10-CM

## 2014-03-23 LAB — URINE CULTURE: Colony Count: 35000

## 2014-03-23 MED ORDER — INSULIN ASPART 100 UNIT/ML ~~LOC~~ SOLN: 0.0000 [IU] | Freq: Every day | SUBCUTANEOUS | Status: DC

## 2014-03-23 MED ORDER — LIDOCAINE-PRILOCAINE 2.5-2.5 % EX CREA: 1.0000 "application " | TOPICAL_CREAM | CUTANEOUS | Status: DC | PRN

## 2014-03-23 MED ORDER — FLUCONAZOLE 100 MG PO TABS
100.0000 mg | ORAL_TABLET | Freq: Every day | ORAL | Status: DC
  Administered 2014-03-23: 100 mg via ORAL
  Filled 2014-03-23: qty 1

## 2014-03-23 MED ORDER — SODIUM CHLORIDE 0.9 % IV SOLN: 100.0000 mL | INTRAVENOUS | Status: DC | PRN

## 2014-03-23 MED ORDER — NEPRO/CARBSTEADY PO LIQD: 237.0000 mL | ORAL | Status: AC | PRN

## 2014-03-23 MED ORDER — INSULIN ASPART 100 UNIT/ML ~~LOC~~ SOLN: 0.0000 [IU] | Freq: Three times a day (TID) | SUBCUTANEOUS | Status: DC

## 2014-03-23 MED ORDER — NEPRO/CARBSTEADY PO LIQD: 237.0000 mL | Freq: Two times a day (BID) | ORAL | Status: AC

## 2014-03-23 MED ORDER — NEPRO/CARBSTEADY PO LIQD: 237.0000 mL | ORAL | Status: DC | PRN

## 2014-03-23 MED ORDER — PENTAFLUOROPROP-TETRAFLUOROETH EX AERO: 1.0000 "application " | INHALATION_SPRAY | CUTANEOUS | Status: DC | PRN

## 2014-03-23 MED ORDER — HEPARIN SODIUM (PORCINE) 1000 UNIT/ML DIALYSIS
20.0000 [IU]/kg | INTRAMUSCULAR | Status: DC | PRN
  Filled 2014-03-23: qty 1

## 2014-03-23 MED ORDER — PRO-STAT SUGAR FREE PO LIQD: 30.0000 mL | Freq: Three times a day (TID) | ORAL | Status: AC

## 2014-03-23 MED ORDER — LIDOCAINE HCL (PF) 1 % IJ SOLN: 5.0000 mL | INTRAMUSCULAR | Status: DC | PRN

## 2014-03-23 MED ORDER — ALTEPLASE 2 MG IJ SOLR
2.0000 mg | Freq: Once | INTRAMUSCULAR | Status: DC | PRN
  Filled 2014-03-23: qty 2

## 2014-03-23 MED ORDER — HEPARIN SODIUM (PORCINE) 1000 UNIT/ML DIALYSIS
1000.0000 [IU] | INTRAMUSCULAR | Status: DC | PRN
  Filled 2014-03-23: qty 1

## 2014-03-23 MED ORDER — FLUCONAZOLE 100 MG PO TABS
100.0000 mg | ORAL_TABLET | Freq: Every day | ORAL | Status: AC
Start: 2014-03-24 — End: 2014-03-29

## 2014-03-23 NOTE — Evaluation (Addendum)
Occupational Therapy Evaluation Patient Details Name: [name] MRN: [id number] DOB: 1940/10/30 Today's Date: 03/23/2014    History of Present Illness Admitted with AMS and multiple wounds; was in a nursing home after her recent hospitalization in April and was discharged last month from the nursing home. Patient went for hemodialysis and since then has been less responsive. Patient has been becoming progressively weak and lethargic and complained of increasing pain in her fingers as well as in her back. Family called EMS due to mental status changes.   Clinical Impression   Pt presents with below problem list. No family present to determine baseline. Feel pt will benefit from acute OT to decrease burden of care and to educate family. Recommending SNF, but according to notes, family plans to take her back home. If family takes pt home, recommending HHOT.    Follow Up Recommendations  SNF;Supervision/Assistance - 24 hour    Equipment Recommendations  Other (comment);Wheelchair (measurements OT);Wheelchair cushion (measurements OT) (hoyer lift; hospital bed; drop arm commode)    Recommendations for Other Services       Precautions / Restrictions Precautions Precautions: Fall Precaution Comments: Multiple wounds: sacral, L trochanter, bilateral thigh and abdomen wounds that look calciphylactic, L heel, L hand digits Required Braces or Orthoses: Other Brace/Splint Other Brace/Splint: Prevalon boot L foot Restrictions Weight Bearing Restrictions: No      Mobility Bed Mobility Overal bed mobility: +2 for physical assistance;Needs Assistance Bed Mobility: Supine to Sit;Sit to Supine     Supine to sit: +2 for physical assistance;Total assist Sit to supine: +2 for physical assistance;Total assist   General bed mobility comments: Cues for technique but pt participating minimally.   Transfers                      Balance Overall balance assessment:  (see ADL comments)                                           ADL Overall ADL's : Needs assistance/impaired     Grooming: Wash/dry face;Oral care;Total assistance;Sitting;Bed level           Upper Body Dressing : Total assistance;Bed level   Lower Body Dressing: Total assistance;Bed level                 General ADL Comments: Pt sat EOB for approximately 6 minutes and OT assisted with washing face-pt requiring assistance with balance and also with task. Pt participated minimally.  Pt requiring Min guard-Max A for balance sitting EOB with left foot supported. OT also assisted in oral care with biotene. Repositioned pt at end of session and turned on mattress.     Vision                     Perception     Praxis      Pertinent Vitals/Pain Pain indicated. Repositioned.     Hand Dominance Right   Extremity/Trunk Assessment Upper Extremity Assessment Upper Extremity Assessment: LUE deficits/detail;Generalized weakness LUE Deficits / Details: dry gangrene 3 digits on left hand   Lower Extremity Assessment Lower Extremity Assessment: Defer to PT evaluation       Communication Communication Communication: Other (comment) (able to answer yes/no questions)   Cognition Arousal/Alertness: Lethargic;Awake/alert (lethargic at times) Behavior During Therapy: [facility] for tasks assessed/performed Overall Cognitive Status: No family/caregiver present to determine  baseline cognitive functioning                     General Comments       Exercises Exercises: Other exercises Other Exercises Other Exercises: AAROM bilateral UE shoulder flexion; PROM wrist flexion/extension on both wrists. AAROM elbow flexion/extension on  RUE.   Shoulder Instructions      Home Living Family/patient expects to be discharged to:: Private residence Living Arrangements: Children (daughter) Available Help at Discharge: Family Type of Home: Apartment Home Access: Level entry      Home Layout: One level                          Prior Functioning/Environment Level of Independence: Needs assistance  Gait / Transfers Assistance Needed: Not exactly sure; likely has been nonambulatory since AKA ADL's / Homemaking Assistance Needed: Dependent for most ADLs   Comments: Amount unknown    OT Diagnosis: Generalized weakness;Acute pain   OT Problem List: Decreased strength;Pain;Decreased activity tolerance;Impaired balance (sitting and/or standing);Decreased cognition;Decreased knowledge of use of DME or AE;Decreased knowledge of precautions   OT Treatment/Interventions: Self-care/ADL training;Balance training;Patient/family education;Visual/perceptual remediation/compensation;Cognitive remediation/compensation;Therapeutic activities;DME and/or AE instruction;Therapeutic exercise    OT Goals(Current goals can be found in the care plan section) Acute Rehab OT Goals Patient Stated Goal: not stated OT Goal Formulation: Patient unable to participate in goal setting Time For Goal Achievement: 04/06/14 Potential to Achieve Goals: Fair ADL Goals Additional ADL Goal #1: Pt will perform bed mobility at Mod A. Additional ADL Goal #2: Pt will participate in UE exercises with Min A  to increase strength. Additional ADL Goal #3: Family will be independent with pressure relief techniques. Additional ADL Goal #4: Family will be independent with assisting pt with ADLs safely.  OT Frequency: Min 1X/week   Barriers to D/C:            Co-evaluation              End of Session Equipment Utilized During Treatment: Oxygen Nurse Communication: Other (comment) (tech made nurse aware pt found in bed without air mattress on)  Activity Tolerance: Patient limited by fatigue; lethargy Patient left: in bed;with call bell/phone within reach;with bed alarm set   Time: [id number] OT Time Calculation (min): 22 min Charges:  OT General Charges $OT Visit: 1 Procedure OT  Evaluation $Initial OT Evaluation Tier I: 1 Procedure OT Treatments $Self Care/Home Management : 8-22 mins G-CodesEarlie Raveling:    Wilhelmine Krogstad L OTR/L [phone] 03/23/2014, 11:40 AM

## 2014-03-23 NOTE — Discharge Summary (Signed)
Physician Discharge Summary  [name] ZOX:[id number] DOB: 03/15/1941 DOA: 03/19/2014  PCP: Michiel Sites, MD  Admit date: 03/19/2014 Discharge date: 03/23/2014  Time spent: 25 minutes  Recommendations for Outpatient Follow-up:  1. New medications: Diflucan 100 mg by mouth daily x6 more days  Discharge Diagnoses:  Principal Problem:   Acute encephalopathy Active Problems:   Peripheral vascular disease, unspecified   S/P CABG x 5   Chronic systolic heart failure   COPD (chronic obstructive pulmonary disease)   Essential hypertension   Anemia   DM type 2, uncontrolled, with renal complications   UTI (lower urinary tract infection)   Sacral decubitus ulcer   Gangrene of finger   S/P AKA (above knee amputation)   VTE (venous thromboembolism)   Infected wound   Discharge Condition: Mild improvement  Diet recommendation: Dysphagia 1 with thin liquids, ProStep 3 times a day  Filed Weights   03/22/14 0851 03/22/14 1300 03/22/14 2112  Weight: 51.2 kg (112 lb 14 oz) 50.2 kg (110 lb 10.7 oz) 50.6 kg (111 lb 8.8 oz)    History of present illness:  On 6/6:[name] is a 73 y.o. female with a past medical history of coronary artery disease, hypertension, diabetes mellitus, CHF, DVT history, ESRD on hemodialysis, AKA, peripheral vascular disease with multiple decubiti ulcer and multiple digits involving dry gangrene following with vascular surgery and wound care. Patient presented with altered mental status after undergoing dialysis. Patient was admitted in a nursing home after her recent hospitalization in April and was discharged last month from the nursing home. She then had hospitalization at [facility] in end of May. She was briefly on hospice but patient expresed a wish to continue HD and so was discharged from hospice.   Hospital Course:  Principal Problem:   Acute encephalopathy: Secondary to urinary tract infection. Resolved with treatment Active Problems:    Peripheral vascular disease, unspecified:She has multiple gangrenes involving Upper and LE digits. She was supposed to f/u with Dr. Lajoyce Corners and Dr. Merlyn Lot for amputations but per daughter they are thinking about it due to risk of surgery. She has very poor prognosis considering rapid worsening of these lesions. There is no good option for her. She was at [facility] about 2 weeks ago. Providers at [facility] felt the same way. Daughter understands. She doesn't want [name] to aggressively pursue this issue currently. Daughter states patient will seek second opinion at [facility] at follow up.    S/P CABG x 5   Chronic systolic heart failure: Stable.    COPD (chronic obstructive pulmonary disease): Stable.    Essential hypertension: Continue home antihypertensive medications    Anemia: Secondary chronic renal disease.    DM type 2, uncontrolled, with renal complications: Stable. Patient restarted on home medications   UTI (lower urinary tract infection): She still makes urine and UA was abnormal. Continue Cipro. Since she has been on multiple antibiotics recently would like to know culture results before discharge. She was discharged from [facility] on 5/28 with Cipro and Flagyl for one week.Patient's urine cultures grew out yeast. Started on Diflucan. Continue for 6 more days for total of 7 days of therapy    Sacral decubitus ulcer   Gangrene of finger   S/P AKA (above knee amputation)   VTE (venous thromboembolism)   Infected wound  Procedures:  Continued hemodialysis  Consultations:  Nephrology  Discharge Exam: Filed Vitals:   03/23/14 1100  BP: 168/77  Pulse: 70  Temp: 97.6 F (36.4 C)  Resp: 18    General: Alert and oriented x3, no acute distress Cardiovascular: Regular rate and rhythm, S1-S2 Respiratory: Clear to auscultation bilaterally  Discharge Instructions You were cared for by a hospitalist during your hospital stay. If you have any questions about your discharge medications or the  care you received while you were in the hospital after you are discharged, you can call the unit and asked to speak with the hospitalist on call if the hospitalist that took care of you is not available. Once you are discharged, your primary care physician will handle any further medical issues. Please note that NO REFILLS for any discharge medications will be authorized once you are discharged, as it is imperative that you return to your primary care physician (or establish a relationship with a primary care physician if you do not have one) for your aftercare needs so that they can reassess your need for medications and monitor your lab values.     Medication List    STOP taking these medications       CIPRO 500 MG tablet  Generic drug:  ciprofloxacin      TAKE these medications       acetaminophen 325 MG tablet  Commonly known as:  TYLENOL  Take 650 mg by mouth 4 (four) times daily.     atorvastatin 40 MG tablet  Commonly known as:  LIPITOR  Take 40 mg by mouth every evening.     B-complex with vitamin C tablet  Take 1 tablet by mouth daily.     calcitRIOL 0.25 MCG capsule  Commonly known as:  ROCALTROL  Take 0.5 mcg by mouth daily.     carvedilol 6.25 MG tablet  Commonly known as:  COREG  Take 6.25 mg by mouth 2 (two) times daily with a meal.     clopidogrel 75 MG tablet  Commonly known as:  PLAVIX  Take 1 tablet (75 mg total) by mouth daily.     collagenase ointment  Commonly known as:  SANTYL  Apply topically daily.     DAKINS EX  - Apply 1 application topically 2 (two) times daily. 0.025% per [facility]  - Apply to sacral and buttock ulcerations     diphenhydrAMINE 25 MG tablet  Commonly known as:  BENADRYL  Take 25 mg by mouth every 6 (six) hours as needed for itching.     feeding supplement (NEPRO CARB STEADY) Liqd  Take 237 mLs by mouth 2 (two) times daily between meals.     feeding supplement (PRO-STAT SUGAR FREE 64) Liqd  Take 30 mLs by mouth  3 (three) times daily with meals.     fluconazole 100 MG tablet  Commonly known as:  DIFLUCAN  Take 1 tablet (100 mg total) by mouth daily.  Start taking on:  03/24/2014     gabapentin 300 MG capsule  Commonly known as:  NEURONTIN  Take 300 mg by mouth 3 (three) times daily.     isosorbide mononitrate 30 MG 24 hr tablet  Commonly known as:  IMDUR  Take 15 mg by mouth daily.     lisinopril 2.5 MG tablet  Commonly known as:  PRINIVIL,ZESTRIL  Take 2.5 mg by mouth daily.     mirtazapine 15 MG tablet  Commonly known as:  REMERON  Take 7.5 mg by mouth at bedtime.     nitroGLYCERIN 0.4 MG SL tablet  Commonly known as:  NITROSTAT  Place 0.4 mg under the tongue every 5 (five)  minutes as needed for chest pain.     omeprazole 20 MG capsule  Commonly known as:  PRILOSEC  Take 20 mg by mouth daily.     oxyCODONE 15 MG immediate release tablet  Commonly known as:  ROXICODONE  Take 7.5 mg by mouth 4 (four) times daily.     polyethylene glycol packet  Commonly known as:  MIRALAX / GLYCOLAX  Take 17 g by mouth daily.     sevelamer carbonate 800 MG tablet  Commonly known as:  RENVELA  Take 1,600 mg by mouth 3 (three) times daily with meals.     STIMULANT LAXATIVE 5 MG EC tablet  Generic drug:  bisacodyl  Take 10 mg by mouth daily as needed (constipation).     traMADol 50 MG tablet  Commonly known as:  ULTRAM  Take 25 mg by mouth 4 (four) times daily.      ASK your doctor about these medications       FLAGYL 250 MG tablet  Generic drug:  metroNIDAZOLE  Take 250 mg by mouth 3 (three) times daily.       Allergies  Allergen Reactions  . Ivp Dye [Iodinated Diagnostic Agents] Hives and Rash  . Aspirin Hives  . Tape Rash      The results of significant diagnostics from this hospitalization (including imaging, microbiology, ancillary and laboratory) are listed below for reference.    Significant Diagnostic Studies: Dg Chest 2 View  03/19/2014   CLINICAL DATA:  Diarrhea,  rectal pain, history of CHF.  EXAM: CHEST  2 VIEW  COMPARISON:  Chest radiograph January 23, 2014.  FINDINGS: Cardiac silhouette is mildly enlarged, similar. Mildly calcified aortic knob. Status post median sternotomy for CABG. Similar COPD with biapical scarring/ bullous changes and pleural thickening. Small right pleural effusion. No pneumothorax.  Tunneled dialysis catheter via right internal jugular venous approach with distal tip projecting cavoatrial junction. Mild degenerative change of the shoulders and thoracic spine. Severe calcification of the abdominal vessels partially imaged.  IMPRESSION: Mild cardiomegaly, COPD with small right pleural effusion.   Electronically Signed   By: Awilda Metroourtnay  Bloomer   On: 03/19/2014 23:41   Ct Head Wo Contrast  03/19/2014   CLINICAL DATA:  Less responsive than normal.  EXAM: CT HEAD WITHOUT CONTRAST  TECHNIQUE: Contiguous axial images were obtained from the base of the skull through the vertex without intravenous contrast.  COMPARISON:  CT of the head January 23, 2014.  FINDINGS: The ventricles and sulci are normal for age. No intraparenchymal hemorrhage, mass effect nor midline shift. Confluent supratentorial white matter hypodensities are nonspecific though suggest sequelae of chronic small vessel ischemic disease. No acute large vascular territory infarcts. Remote right caudate lacunar infarct.  No abnormal extra-axial fluid collections. Basal cisterns are patent. Severe calcific atherosclerosis of the carotid siphons.  No skull fracture. The included ocular globes and orbital contents are non-suspicious. Fluid filled mildly expanded sella, unchanged. The mastoid aircells and included paranasal sinuses are well-aerated. Patient is edentulous. Marked scalp calcifications.  IMPRESSION: No acute intracranial process.  Moderate global brain atrophy with moderate to severe white matter changes suggesting chronic small vessel ischemic disease. Remote right caudate lacunar  infarct.   Electronically Signed   By: Awilda Metroourtnay  Bloomer   On: 03/19/2014 23:30    Microbiology: Recent Results (from the past 240 hour(s))  URINE CULTURE     Status: None   Collection Time    03/19/14 10:27 PM      Result Value Ref  Range Status   Specimen Description URINE, CATHETERIZED   Final   Special Requests NONE   Final   Culture  Setup Time     Final   Value: 03/20/2014 16:24     Performed at [facility]   Colony Count PENDING   Incomplete   Culture     Final   Value: YEAST Culture reincubated for better growth     Performed at [facility]   Report Status PENDING   Incomplete  CULTURE, BLOOD (ROUTINE X 2)     Status: None   Collection Time    03/19/14 10:53 PM      Result Value Ref Range Status   Specimen Description BLOOD LEFT EJ   Final   Special Requests BOTTLES DRAWN AEROBIC AND ANAEROBIC 10CC   Final   Culture  Setup Time     Final   Value: 03/20/2014 13:45     Performed at [facility]   Culture     Final   Value:        BLOOD CULTURE RECEIVED NO GROWTH TO DATE CULTURE WILL BE HELD FOR 5 DAYS BEFORE ISSUING A FINAL NEGATIVE REPORT     Performed at [facility]   Report Status PENDING   Incomplete  MRSA PCR SCREENING     Status: None   Collection Time    03/20/14  1:04 AM      Result Value Ref Range Status   MRSA by PCR NEGATIVE  NEGATIVE Final   Comment:            The GeneXpert MRSA Assay (FDA     approved for NASAL specimens     only), is one component of a     comprehensive MRSA colonization     surveillance program. It is not     intended to diagnose MRSA     infection nor to guide or     monitor treatment for     MRSA infections.  CULTURE, BLOOD (ROUTINE X 2)     Status: None   Collection Time    03/20/14  5:45 AM      Result Value Ref Range Status   Specimen Description BLOOD RIGHT HAND   Final   Special Requests BOTTLES DRAWN AEROBIC ONLY 3CC   Final   Culture  Setup Time     Final   Value: 03/20/2014 13:46      Performed at [facility]   Culture     Final   Value:        BLOOD CULTURE RECEIVED NO GROWTH TO DATE CULTURE WILL BE HELD FOR 5 DAYS BEFORE ISSUING A FINAL NEGATIVE REPORT     Performed at [facility]   Report Status PENDING   Incomplete     Labs: Basic Metabolic Panel:  Recent Labs Lab 03/19/14 2253 03/20/14 0314 03/22/14 0854  NA 143 142 139  139  K 3.0* 3.4* 3.3*  3.4*  CL 100 98 97  96  CO2 29 25 30  29   GLUCOSE 149* 167* 225*  224*  BUN 13 15 35*  34*  CREATININE 1.41* 1.60* 3.04*  3.04*  CALCIUM 8.7 8.7 8.0*  7.9*  PHOS  --   --  4.2   Liver Function Tests:  Recent Labs Lab 03/20/14 0314 03/22/14 0854  AST 17  --   ALT 11  --   ALKPHOS 130*  --   BILITOT 0.8  --  PROT 6.2  --   ALBUMIN 1.7* 1.5*   No results found for this basename: LIPASE, AMYLASE,  in the last 168 hours No results found for this basename: AMMONIA,  in the last 168 hours CBC:  Recent Labs Lab 03/19/14 2253 03/20/14 0314 03/22/14 0800  WBC 16.3* 16.3* 16.2*  NEUTROABS 14.5*  --   --   HGB 9.7* 9.7* 9.1*  HCT 30.8* 31.8* 29.6*  MCV 83.7 85.0 86.0  PLT 240 243 206   Cardiac Enzymes: No results found for this basename: CKTOTAL, CKMB, CKMBINDEX, TROPONINI,  in the last 168 hours BNP: BNP (last 3 results)  Recent Labs  08/17/13 1603 12/22/13 1809 01/23/14 1140  PROBNP 28521.0* >70000.0* >70000.0*   CBG:  Recent Labs Lab 03/20/14 0126 03/22/14 2111  GLUCAP 145* 167*       Signed:  Tahjir Silveria K  [facility] 03/23/2014, 2:27 PM

## 2014-03-23 NOTE — Progress Notes (Signed)
Pt back to bed.  IV team on floor to remove EJ.

## 2014-03-23 NOTE — Progress Notes (Signed)
[name] Progress Note  Assessment/Plan: 1. AMS - ? Secondary to UTI + yeast pending; BC pending - ? baseline/dementia- Head CT 6/6 mod atrophy, mod-severe chronic small vessel dz, remote infarct; on Vanc and cipro ? Add diflucan. 2. Sacral ulcer - wound care, on empiric antibiotics. 3. Severe PVD with steal syndrome- multiple areas of dry gangrene, especially @ L hand,sec to steal; AVF @ [facility] ligated 3/29. 4. 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 5. ESRD - HD on TTS @ [facility] - yesterday told me she wanted to stop HD, then told [name] she didn'[name] remember the conversation; today doesn'[name] remember the conversation; hard to get her to engage in conversation about this made worse by her coughing this am. 6. Hypertension/volume - on Amlodipine;, UF 1 L Tuesday 7. Anemia - Hgb 9.7> 9.1 on Aranesp 60 mcg on Tues; Fe 11%, but ferritin > 2600; CRP 11 8. Metabolic bone disease - , Calcitriol stopped Fosrenol with meals. - should not be on if really calciphylaxis 9. Severe portein calorie malnutrition - Alb 1.5, Dys 1 diet - coughing this am after eating only a few bits of sherbet- huge risk for aspiration ,. had RD consult -requires feeding; will sip nepro via straw - likes this  10. DM Type 2 - Lantus & SSI, no longer on meds. 11. Hx CAD - s/p CABG x 5 in 2009. 12. Disp - had recent SNF stay, discharged home then hospitalized [facility]; she was discharged from hospice because she wished to continue HD; Daughter refuses palliative care her long term prognosis is extremely poor; it is inappropriate to prolong her sufferering with HD; However, her daughter is POA and wants HD to continue- until God takes her --see [name]'s comments 6/7    Rhonda SliderMartha B Bergman, PA-C [name] Beeper [phone] 03/23/2014,8:59 AM  LOS: 4 days   Subjective:   Pt reports strangling on food when being fed this am. Only had  Some sherbet. Coughing clear sticky  sputum, cough is weak   Objective Filed Vitals:   03/22/14 1300 03/22/14 1738 03/22/14 2112 03/23/14 0531  BP: 146/77 150/84 122/66 126/62  Pulse: 97 98 83 72  Temp: 97.6 F (36.4 C) 97.5 F (36.4 C) 97.8 F (36.6 C) 97.7 F (36.5 C)  TempSrc: Oral Oral Oral Oral  Resp: 18 18 18 18   Weight: 50.2 kg (110 lb 10.7 oz)  50.6 kg (111 lb 8.8 oz)   SpO2: 96% 98% 96% 94%   Physical Exam General: frail emaciated, weak, coughing intermittently trying to get up sputum Heart: RRR Lungs: coarse BS on right Abdomen: soft nt Extremities: right AKA, left no overt edema Skin: multiple wounds fingers, foot, groin, post thigh, sacrum Dialysis Access: right I-J cath  Dialysis Orders: TTS @ [facility]  4 hrs 51.5 kg 2K/2.25Ca 400/A1.5 Heparin 1800 U R IJ catheter  No Hectorol Aranesp 60 mcg on Tues No Venofer  I have seen and examined this patient and agree with plan per [name].  She is suffering from multiple wounds from pressure ulcers to ischemic digits to what looks like calciphylaxis.  Her prognosis is poor and comfort care would be the most humane option.  Pt really unable to take part in meaningful discussion regarding her care . Rhonda Denunzio T,MD 03/23/2014 9:46 AM Additional Objective Labs: Basic Metabolic Panel:  Recent Labs Lab 03/19/14 2253 03/20/14 0314 03/22/14 0854  NA 143 142 139  139  K 3.0* 3.4* 3.3*  3.4*  CL 100 98 97  96  CO2 29 25 30  29   GLUCOSE 149* 167* 225*  224*  BUN 13 15 35*  34*  CREATININE 1.41* 1.60* 3.04*  3.04*  CALCIUM 8.7 8.7 8.0*  7.9*  PHOS  --   --  4.2   Liver Function Tests:  Recent Labs Lab 03/20/14 0314 03/22/14 0854  AST 17  --   ALT 11  --   ALKPHOS 130*  --   BILITOT 0.8  --   PROT 6.2  --   ALBUMIN 1.7* 1.5*   CBC:  Recent Labs Lab 03/19/14 2253 03/20/14 0314 03/22/14 0800  WBC 16.3* 16.3* 16.2*  NEUTROABS 14.5*  --   --   HGB 9.7* 9.7* 9.1*  HCT 30.8* 31.8* 29.6*  MCV 83.7 85.0 86.0  PLT 240 243 206    Blood Culture    Component Value Date/Time   SDES BLOOD RIGHT HAND 03/20/2014 0545   SPECREQUEST BOTTLES DRAWN AEROBIC ONLY 3CC 03/20/2014 0545   CULT  Value:        BLOOD CULTURE RECEIVED NO GROWTH TO DATE CULTURE WILL BE HELD FOR 5 DAYS BEFORE ISSUING A FINAL NEGATIVE REPORT Performed at [facility] 03/20/2014 0545   REPTSTATUS PENDING 03/20/2014 0545   CBG:  Recent Labs Lab 03/20/14 0126 03/22/14 2111  GLUCAP 145* 167*  Medications: . sodium chloride 20 mL/hr at 03/19/14 2219   . amLODipine  10 mg Oral Daily  . antiseptic oral rinse  15 mL Mouth Rinse BID  . atorvastatin  40 mg Oral Daily  . ciprofloxacin  400 mg Intravenous Q24H  . clopidogrel  75 mg Oral Daily  . darbepoetin (ARANESP) injection - DIALYSIS  60 mcg Intravenous Q Tue-HD  . feeding supplement (NEPRO CARB STEADY)  237 mL Oral BID BM  . feeding supplement (PRO-STAT SUGAR FREE 64)  30 mL Oral TID WC  . gabapentin  300 mg Oral TID  . heparin  5,000 Units Subcutaneous 3 times per day  . isosorbide mononitrate  15 mg Oral Daily  . lanthanum  500 mg Oral TID WC  . mirtazapine  7.5 mg Oral QHS  . multivitamin  1 tablet Oral QHS  . pantoprazole  40 mg Oral Daily  . sodium hypochlorite   Irrigation q morning - 10a  . vancomycin  500 mg Intravenous Q [name],Th,Sa-HD

## 2014-03-23 NOTE — Progress Notes (Signed)
03/23/2014 19:37  Patient discharged home,accompanied by daughter. Ronni Osterberg, Drinda Butts, Charity fundraiser

## 2014-03-23 NOTE — Progress Notes (Signed)
Called [name] daughter left msg to return call 2nd attempt.

## 2014-03-23 NOTE — Progress Notes (Signed)
Pt discharge instructions given. Daughter verbalized understanding.

## 2014-03-23 NOTE — Progress Notes (Signed)
Called pt's dauther [name] [phone] left msg to return call.

## 2014-03-23 NOTE — Progress Notes (Signed)
Paged Dr. Rito Ehrlich, pt daughter here to pick up pt to take home. Pt on 2 lpm via [place] here pt does not have home O2 does she need O2 @ home.  Pt lethargic.

## 2014-03-23 NOTE — Progress Notes (Signed)
Inpatient Diabetes Program Recommendations  AACE/ADA: New Consensus Statement on Inpatient Glycemic Control (2013)  Target Ranges:  Prepandial:   less than 140 mg/dL      Peak postprandial:   less than 180 mg/dL (1-2 hours)      Critically ill patients:  140 - 180 mg/dL   Results for PEITON, ANSELMO (MRN [id number]) as of 03/23/2014 12:24  Ref. Range 01/01/2014 06:00  Hemoglobin A1C Latest Range: <5.7 % 8.1 (H)  Results for LACYE, WEICHMAN (MRN [id number]) as of 03/23/2014 12:24  Ref. Range 03/22/2014 08:54 03/22/2014 08:54  Glucose Latest Range: 70-99 mg/dL 124 (H) 580 (H)  Results for AAMIRA, JASKO (MRN [id number]) as of 03/23/2014 12:24  Ref. Range 03/22/2014 21:11  Glucose-Capillary Latest Range: 70-99 mg/dL 539 (H)   Diabetes history: DM2 Outpatient Diabetes medications: None Current orders for Inpatient glycemic control: None  Inpatient Diabetes Program Recommendations Correction (SSI): Patient has a history of diabetes. While inpatient, please order CBGs with Novolog sensitive correction scale ACHS.  Thanks, Orlando Penner, RN, MSN, CCRN Diabetes Coordinator Inpatient Diabetes Program [phone] (Team Pager) [phone] ([facility] office) [phone] [facility] office)

## 2014-03-23 NOTE — Progress Notes (Signed)
Dr. Rito Ehrlich stated pt ok to go home without O2. Pt had been weaned off O2 prior to admission.  Pt ok for discharge.

## 2014-03-23 NOTE — Progress Notes (Signed)
Called daughter [name] left msg to return call.

## 2014-03-26 LAB — CULTURE, BLOOD (ROUTINE X 2)
Culture: NO GROWTH
Culture: NO GROWTH

## 2014-04-13 DEATH — deceased

## 2014-04-22 ENCOUNTER — Encounter (HOSPITAL_BASED_OUTPATIENT_CLINIC_OR_DEPARTMENT_OTHER): Payer: Medicare Other

## 2014-09-22 ENCOUNTER — Encounter (HOSPITAL_COMMUNITY): Payer: Self-pay | Admitting: Vascular Surgery

## 2015-02-05 IMAGING — CR DG CHEST 1V PORT
2 series · 2 of 2 positions shown · non-contrast
Comparison: 01/01/2014

CLINICAL DATA: Central line placement

EXAM:
PORTABLE CHEST - 1 VIEW

[AP (1 of 2)]
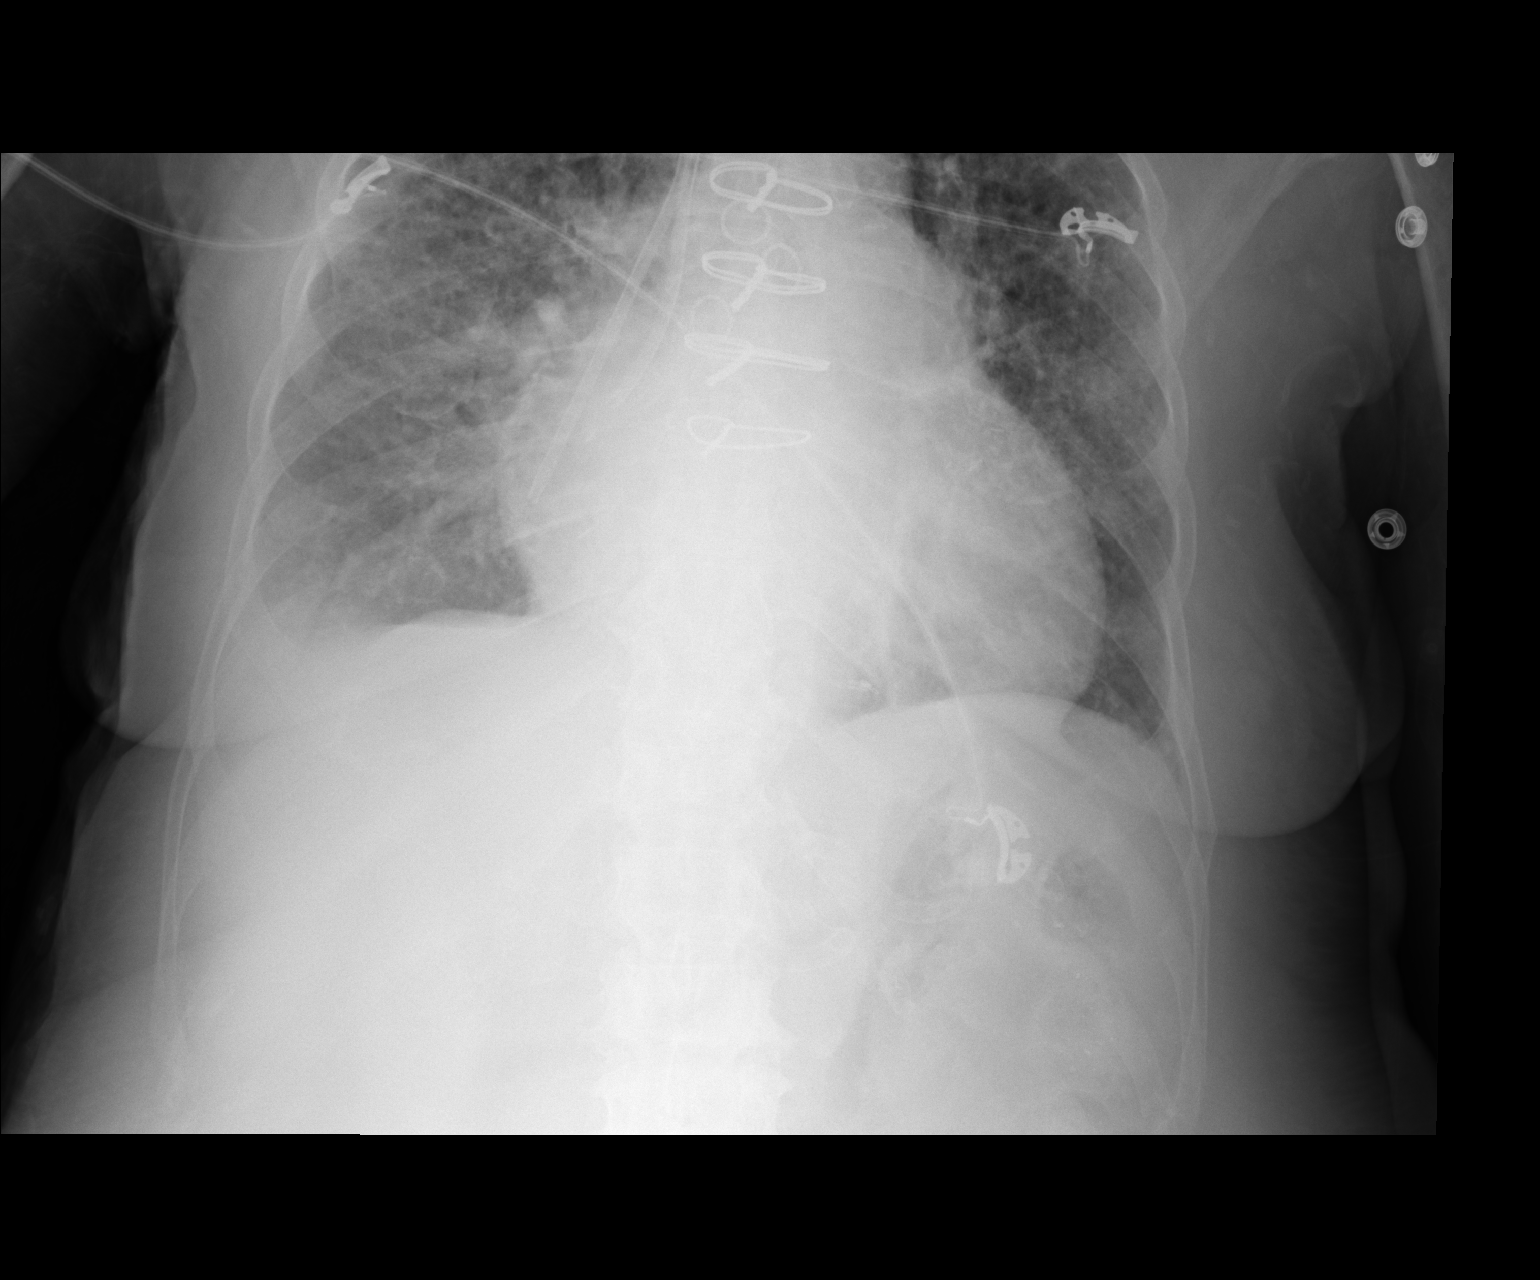

[AP (2 of 2)]
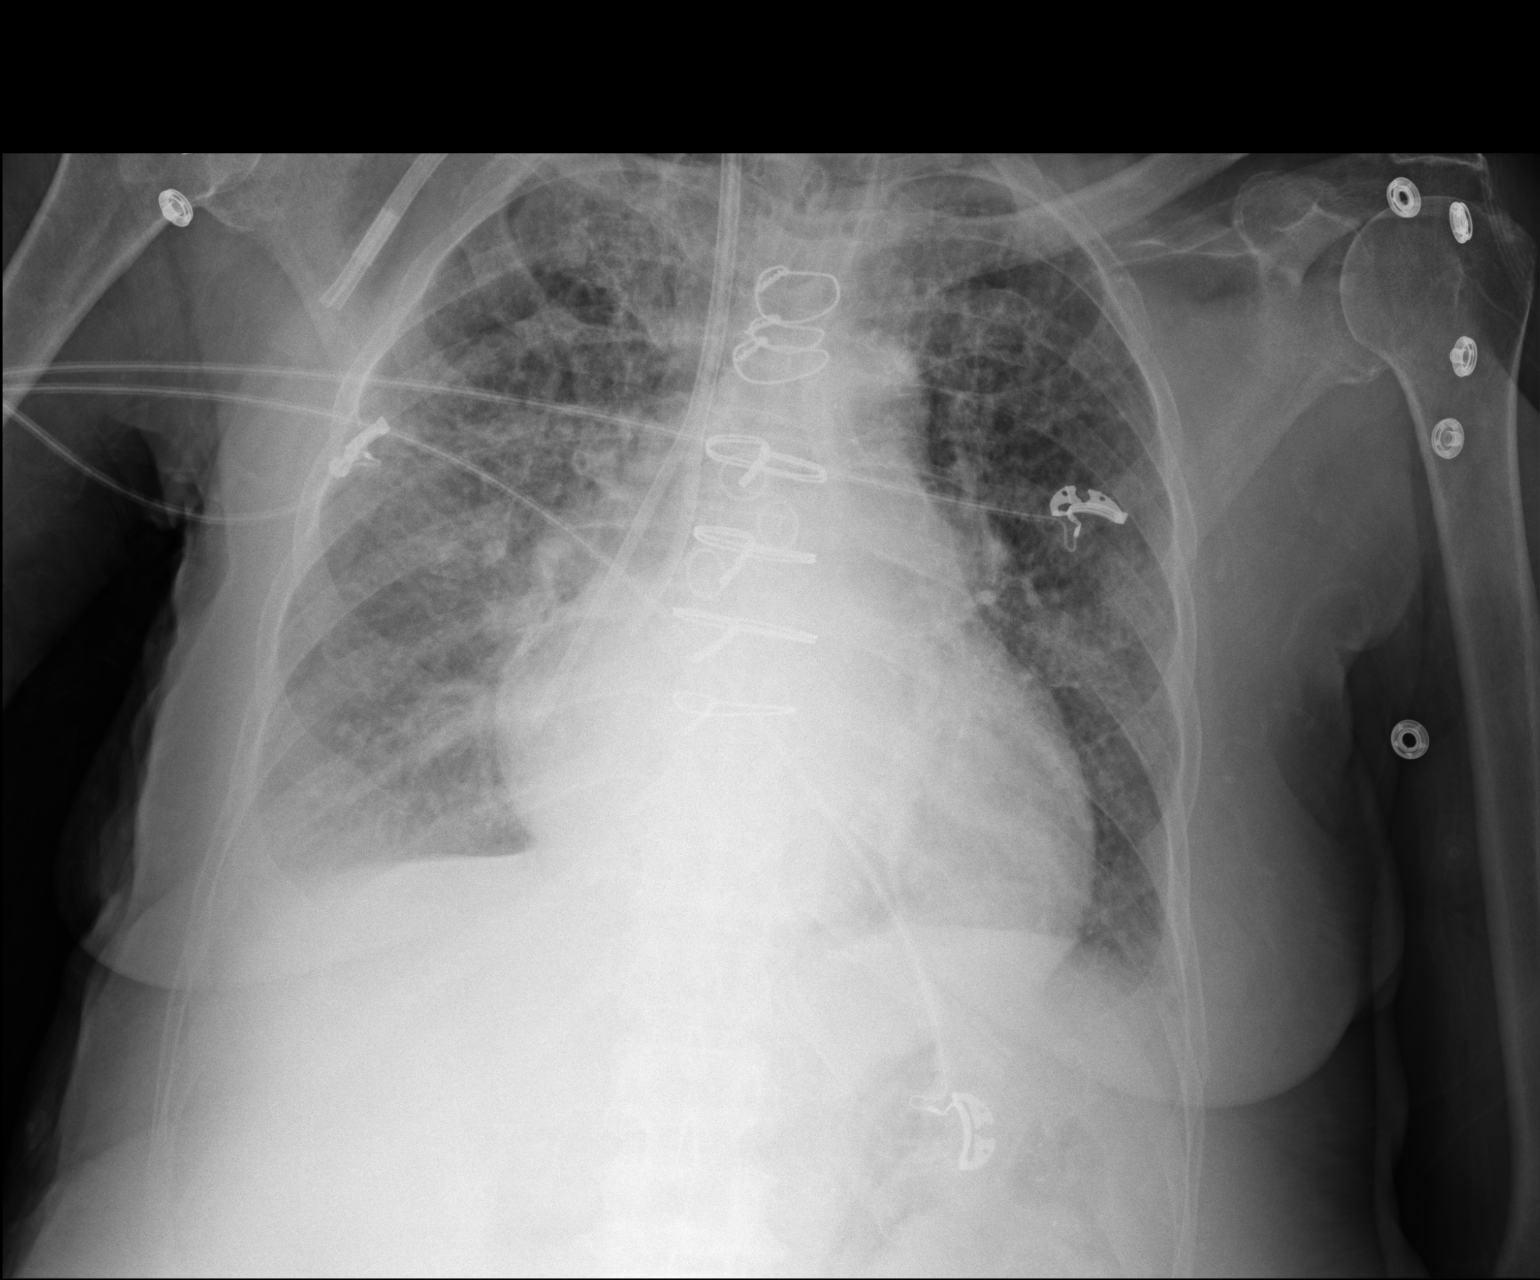

[2 of 2 positions shown; findings below may reference images not displayed]

FINDINGS: Dual-lumen central line, tip at the level of the upper right atrium.
No evidence of pneumothorax.

Unchanged cardiopericardial enlargement. Status post CABG. Worsening
diffuse interstitial opacity. There are small layering pleural
effusions, right more than left.
IMPRESSION: 1. Right IJ central line with the lowest catheter tip in the upper
right atrium. No pneumothorax.
2. Worsening CHF.
